# Patient Record
Sex: Female | Born: 1935 | Race: White | Hispanic: No | Smoking: Former smoker
Health system: Southern US, Community
[De-identification: ages and names within clinical notes are randomized; demographics above are authoritative.]

## PROBLEM LIST (undated history)

## (undated) DIAGNOSIS — F329 Major depressive disorder, single episode, unspecified: Secondary | ICD-10-CM

## (undated) DIAGNOSIS — R55 Syncope and collapse: Secondary | ICD-10-CM

## (undated) DIAGNOSIS — G40909 Epilepsy, unspecified, not intractable, without status epilepticus: Secondary | ICD-10-CM

## (undated) DIAGNOSIS — R5383 Other fatigue: Secondary | ICD-10-CM

## (undated) DIAGNOSIS — K219 Gastro-esophageal reflux disease without esophagitis: Secondary | ICD-10-CM

## (undated) DIAGNOSIS — I5032 Chronic diastolic (congestive) heart failure: Secondary | ICD-10-CM

## (undated) DIAGNOSIS — E785 Hyperlipidemia, unspecified: Secondary | ICD-10-CM

## (undated) DIAGNOSIS — Z720 Tobacco use: Secondary | ICD-10-CM

## (undated) DIAGNOSIS — N183 Chronic kidney disease, stage 3 unspecified: Secondary | ICD-10-CM

## (undated) DIAGNOSIS — F32A Depression, unspecified: Secondary | ICD-10-CM

## (undated) DIAGNOSIS — R413 Other amnesia: Secondary | ICD-10-CM

## (undated) DIAGNOSIS — R42 Dizziness and giddiness: Secondary | ICD-10-CM

## (undated) DIAGNOSIS — I10 Essential (primary) hypertension: Secondary | ICD-10-CM

## (undated) DIAGNOSIS — J449 Chronic obstructive pulmonary disease, unspecified: Secondary | ICD-10-CM

## (undated) DIAGNOSIS — F419 Anxiety disorder, unspecified: Secondary | ICD-10-CM

## (undated) DIAGNOSIS — D649 Anemia, unspecified: Secondary | ICD-10-CM

## (undated) DIAGNOSIS — I2510 Atherosclerotic heart disease of native coronary artery without angina pectoris: Secondary | ICD-10-CM

## (undated) HISTORY — DX: Anemia, unspecified: D64.9

## (undated) HISTORY — DX: Syncope and collapse: R55

## (undated) HISTORY — DX: Tobacco use: Z72.0

## (undated) HISTORY — DX: Hyperlipidemia, unspecified: E78.5

## (undated) HISTORY — DX: Chronic diastolic (congestive) heart failure: I50.32

## (undated) HISTORY — DX: Anxiety disorder, unspecified: F41.9

## (undated) HISTORY — DX: Major depressive disorder, single episode, unspecified: F32.9

## (undated) HISTORY — DX: Atherosclerotic heart disease of native coronary artery without angina pectoris: I25.10

## (undated) HISTORY — DX: Other amnesia: R41.3

## (undated) HISTORY — DX: Chronic obstructive pulmonary disease, unspecified: J44.9

## (undated) HISTORY — DX: Dizziness and giddiness: R42

## (undated) HISTORY — DX: Epilepsy, unspecified, not intractable, without status epilepticus: G40.909

## (undated) HISTORY — DX: Chronic kidney disease, stage 3 (moderate): N18.3

## (undated) HISTORY — PX: ABDOMINAL HYSTERECTOMY: SHX81

## (undated) HISTORY — DX: Gastro-esophageal reflux disease without esophagitis: K21.9

## (undated) HISTORY — DX: Other fatigue: R53.83

## (undated) HISTORY — DX: Depression, unspecified: F32.A

## (undated) HISTORY — DX: Chronic kidney disease, stage 3 unspecified: N18.30

## (undated) HISTORY — DX: Essential (primary) hypertension: I10

## (undated) SURGERY — Surgical Case
Anesthesia: *Unknown

---

## 1998-11-27 ENCOUNTER — Ambulatory Visit (HOSPITAL_COMMUNITY): Admission: RE | Admit: 1998-11-27 | Discharge: 1998-11-27 | Payer: Self-pay | Admitting: Gastroenterology

## 2000-02-04 ENCOUNTER — Encounter: Payer: Self-pay | Admitting: Emergency Medicine

## 2000-02-05 ENCOUNTER — Inpatient Hospital Stay (HOSPITAL_COMMUNITY): Admission: EM | Admit: 2000-02-05 | Discharge: 2000-02-08 | Payer: Self-pay | Admitting: Emergency Medicine

## 2000-02-08 ENCOUNTER — Encounter: Payer: Self-pay | Admitting: Internal Medicine

## 2000-09-25 ENCOUNTER — Encounter: Payer: Self-pay | Admitting: Family Medicine

## 2000-09-25 ENCOUNTER — Ambulatory Visit (HOSPITAL_COMMUNITY): Admission: RE | Admit: 2000-09-25 | Discharge: 2000-09-25 | Payer: Self-pay | Admitting: Family Medicine

## 2001-01-03 ENCOUNTER — Ambulatory Visit (HOSPITAL_COMMUNITY): Admission: RE | Admit: 2001-01-03 | Discharge: 2001-01-03 | Payer: Self-pay

## 2001-05-11 ENCOUNTER — Ambulatory Visit (HOSPITAL_COMMUNITY): Admission: RE | Admit: 2001-05-11 | Discharge: 2001-05-11 | Payer: Self-pay | Admitting: Family Medicine

## 2001-05-11 ENCOUNTER — Encounter: Payer: Self-pay | Admitting: Family Medicine

## 2001-09-04 ENCOUNTER — Ambulatory Visit (HOSPITAL_COMMUNITY): Admission: RE | Admit: 2001-09-04 | Discharge: 2001-09-04 | Payer: Self-pay | Admitting: Family Medicine

## 2001-09-04 ENCOUNTER — Encounter: Payer: Self-pay | Admitting: Family Medicine

## 2001-09-17 ENCOUNTER — Encounter: Payer: Self-pay | Admitting: Cardiology

## 2001-09-17 ENCOUNTER — Inpatient Hospital Stay (HOSPITAL_COMMUNITY): Admission: EM | Admit: 2001-09-17 | Discharge: 2001-09-20 | Payer: Self-pay | Admitting: Emergency Medicine

## 2002-02-06 ENCOUNTER — Encounter: Payer: Self-pay | Admitting: Cardiology

## 2002-02-06 ENCOUNTER — Inpatient Hospital Stay (HOSPITAL_COMMUNITY): Admission: EM | Admit: 2002-02-06 | Discharge: 2002-02-09 | Payer: Self-pay | Admitting: Emergency Medicine

## 2002-03-27 ENCOUNTER — Emergency Department (HOSPITAL_COMMUNITY): Admission: EM | Admit: 2002-03-27 | Discharge: 2002-03-27 | Payer: Self-pay | Admitting: Emergency Medicine

## 2002-03-27 ENCOUNTER — Encounter: Payer: Self-pay | Admitting: *Deleted

## 2002-04-15 ENCOUNTER — Ambulatory Visit (HOSPITAL_COMMUNITY): Admission: RE | Admit: 2002-04-15 | Discharge: 2002-04-15 | Payer: Self-pay | Admitting: Cardiology

## 2002-08-22 HISTORY — PX: CARDIAC CATHETERIZATION: SHX172

## 2002-12-31 ENCOUNTER — Encounter: Admission: RE | Admit: 2002-12-31 | Discharge: 2002-12-31 | Payer: Self-pay | Admitting: Family Medicine

## 2002-12-31 ENCOUNTER — Encounter: Payer: Self-pay | Admitting: Family Medicine

## 2003-03-12 ENCOUNTER — Encounter: Payer: Self-pay | Admitting: Internal Medicine

## 2003-03-12 ENCOUNTER — Ambulatory Visit (HOSPITAL_COMMUNITY): Admission: RE | Admit: 2003-03-12 | Discharge: 2003-03-12 | Payer: Self-pay | Admitting: Internal Medicine

## 2003-06-04 ENCOUNTER — Ambulatory Visit (HOSPITAL_COMMUNITY): Admission: RE | Admit: 2003-06-04 | Discharge: 2003-06-04 | Payer: Self-pay | Admitting: Cardiology

## 2003-07-07 ENCOUNTER — Encounter: Admission: RE | Admit: 2003-07-07 | Discharge: 2003-07-07 | Payer: Self-pay | Admitting: Orthopedic Surgery

## 2003-07-09 ENCOUNTER — Ambulatory Visit (HOSPITAL_COMMUNITY): Admission: RE | Admit: 2003-07-09 | Discharge: 2003-07-09 | Payer: Self-pay | Admitting: Orthopedic Surgery

## 2003-07-09 ENCOUNTER — Encounter (INDEPENDENT_AMBULATORY_CARE_PROVIDER_SITE_OTHER): Payer: Self-pay | Admitting: *Deleted

## 2003-07-09 ENCOUNTER — Ambulatory Visit (HOSPITAL_BASED_OUTPATIENT_CLINIC_OR_DEPARTMENT_OTHER): Admission: RE | Admit: 2003-07-09 | Discharge: 2003-07-09 | Payer: Self-pay | Admitting: Orthopedic Surgery

## 2004-02-11 ENCOUNTER — Encounter: Admission: RE | Admit: 2004-02-11 | Discharge: 2004-02-11 | Payer: Self-pay | Admitting: Family Medicine

## 2004-04-25 ENCOUNTER — Emergency Department (HOSPITAL_COMMUNITY): Admission: EM | Admit: 2004-04-25 | Discharge: 2004-04-25 | Payer: Self-pay | Admitting: Emergency Medicine

## 2004-04-30 ENCOUNTER — Ambulatory Visit: Payer: Self-pay | Admitting: Family Medicine

## 2004-05-18 ENCOUNTER — Ambulatory Visit (HOSPITAL_COMMUNITY): Admission: RE | Admit: 2004-05-18 | Discharge: 2004-05-18 | Payer: Self-pay | Admitting: Internal Medicine

## 2004-05-18 ENCOUNTER — Encounter: Payer: Self-pay | Admitting: Cardiology

## 2004-05-24 ENCOUNTER — Ambulatory Visit: Payer: Self-pay | Admitting: Family Medicine

## 2004-05-25 ENCOUNTER — Ambulatory Visit: Payer: Self-pay | Admitting: Family Medicine

## 2004-05-25 ENCOUNTER — Other Ambulatory Visit: Admission: RE | Admit: 2004-05-25 | Discharge: 2004-05-25 | Payer: Self-pay | Admitting: Family Medicine

## 2004-06-14 ENCOUNTER — Ambulatory Visit: Payer: Self-pay | Admitting: Family Medicine

## 2004-09-08 ENCOUNTER — Ambulatory Visit (HOSPITAL_COMMUNITY): Admission: RE | Admit: 2004-09-08 | Discharge: 2004-09-08 | Payer: Self-pay

## 2004-09-08 ENCOUNTER — Ambulatory Visit: Payer: Self-pay | Admitting: Family Medicine

## 2004-09-10 ENCOUNTER — Ambulatory Visit (HOSPITAL_COMMUNITY): Admission: RE | Admit: 2004-09-10 | Discharge: 2004-09-10 | Payer: Self-pay | Admitting: Internal Medicine

## 2004-09-10 ENCOUNTER — Ambulatory Visit: Payer: Self-pay | Admitting: Family Medicine

## 2004-09-14 ENCOUNTER — Ambulatory Visit: Payer: Self-pay | Admitting: Family Medicine

## 2004-10-21 ENCOUNTER — Ambulatory Visit: Payer: Self-pay | Admitting: Family Medicine

## 2004-10-22 ENCOUNTER — Ambulatory Visit (HOSPITAL_COMMUNITY): Admission: RE | Admit: 2004-10-22 | Discharge: 2004-10-22 | Payer: Self-pay | Admitting: Internal Medicine

## 2004-10-26 ENCOUNTER — Ambulatory Visit (HOSPITAL_COMMUNITY): Admission: RE | Admit: 2004-10-26 | Discharge: 2004-10-26 | Payer: Self-pay | Admitting: Internal Medicine

## 2004-11-23 ENCOUNTER — Ambulatory Visit: Payer: Self-pay | Admitting: Family Medicine

## 2005-02-03 ENCOUNTER — Ambulatory Visit: Payer: Self-pay | Admitting: Family Medicine

## 2005-02-28 ENCOUNTER — Ambulatory Visit: Payer: Self-pay | Admitting: Family Medicine

## 2005-03-01 ENCOUNTER — Ambulatory Visit (HOSPITAL_COMMUNITY): Admission: RE | Admit: 2005-03-01 | Discharge: 2005-03-01 | Payer: Self-pay | Admitting: Internal Medicine

## 2005-03-16 ENCOUNTER — Inpatient Hospital Stay (HOSPITAL_COMMUNITY): Admission: EM | Admit: 2005-03-16 | Discharge: 2005-03-20 | Payer: Self-pay | Admitting: Emergency Medicine

## 2005-06-22 ENCOUNTER — Ambulatory Visit: Payer: Self-pay | Admitting: Family Medicine

## 2005-06-24 ENCOUNTER — Encounter: Admission: RE | Admit: 2005-06-24 | Discharge: 2005-06-24 | Payer: Self-pay | Admitting: Family Medicine

## 2006-11-14 ENCOUNTER — Encounter: Admission: RE | Admit: 2006-11-14 | Discharge: 2006-11-14 | Payer: Self-pay | Admitting: Family Medicine

## 2007-02-16 ENCOUNTER — Inpatient Hospital Stay (HOSPITAL_COMMUNITY): Admission: EM | Admit: 2007-02-16 | Discharge: 2007-02-19 | Payer: Self-pay | Admitting: Emergency Medicine

## 2007-02-19 ENCOUNTER — Encounter (INDEPENDENT_AMBULATORY_CARE_PROVIDER_SITE_OTHER): Payer: Self-pay | Admitting: Internal Medicine

## 2007-02-19 ENCOUNTER — Ambulatory Visit: Payer: Self-pay | Admitting: Vascular Surgery

## 2007-02-26 ENCOUNTER — Telehealth (INDEPENDENT_AMBULATORY_CARE_PROVIDER_SITE_OTHER): Payer: Self-pay | Admitting: *Deleted

## 2007-07-24 ENCOUNTER — Encounter: Admission: RE | Admit: 2007-07-24 | Discharge: 2007-07-24 | Payer: Self-pay | Admitting: Cardiology

## 2008-02-18 ENCOUNTER — Ambulatory Visit: Payer: Self-pay | Admitting: Oncology

## 2008-02-18 LAB — CBC & DIFF AND RETIC
BASO%: 0.9 % (ref 0.0–2.0)
HCT: 33.2 % — ABNORMAL LOW (ref 34.8–46.6)
HGB: 11.6 g/dL (ref 11.6–15.9)
IRF: 0.3 (ref 0.130–0.330)
MCHC: 35 g/dL (ref 32.0–36.0)
MONO#: 0.8 10*3/uL (ref 0.1–0.9)
NEUT%: 53.7 % (ref 39.6–76.8)
RDW: 13.3 % (ref 11.3–14.5)
RETIC #: 52.7 10*3/uL (ref 19.7–115.1)
WBC: 8.1 10*3/uL (ref 3.9–10.0)
lymph#: 2.8 10*3/uL (ref 0.9–3.3)

## 2008-02-18 LAB — CHCC SMEAR

## 2008-02-20 LAB — IRON AND TIBC
%SAT: 21 % (ref 20–55)
TIBC: 409 ug/dL (ref 250–470)
UIBC: 325 ug/dL

## 2008-02-20 LAB — LACTATE DEHYDROGENASE: LDH: 137 U/L (ref 94–250)

## 2008-02-20 LAB — COMPREHENSIVE METABOLIC PANEL
ALT: 8 U/L (ref 0–35)
AST: 16 U/L (ref 0–37)
Alkaline Phosphatase: 53 U/L (ref 39–117)
CO2: 23 mEq/L (ref 19–32)
Sodium: 127 mEq/L — ABNORMAL LOW (ref 135–145)
Total Bilirubin: 0.5 mg/dL (ref 0.3–1.2)
Total Protein: 7.4 g/dL (ref 6.0–8.3)

## 2008-02-20 LAB — VITAMIN B12: Vitamin B-12: 513 pg/mL (ref 211–911)

## 2008-03-14 LAB — CBC WITH DIFFERENTIAL/PLATELET
Basophils Absolute: 0 10*3/uL (ref 0.0–0.1)
EOS%: 2.6 % (ref 0.0–7.0)
Eosinophils Absolute: 0.2 10*3/uL (ref 0.0–0.5)
HGB: 10.4 g/dL — ABNORMAL LOW (ref 11.6–15.9)
NEUT#: 3.8 10*3/uL (ref 1.5–6.5)
RDW: 13.3 % (ref 11.3–14.5)
lymph#: 3.2 10*3/uL (ref 0.9–3.3)

## 2008-04-23 ENCOUNTER — Ambulatory Visit: Payer: Self-pay | Admitting: Oncology

## 2008-04-29 LAB — CBC WITH DIFFERENTIAL/PLATELET
BASO%: 0.3 % (ref 0.0–2.0)
LYMPH%: 38.9 % (ref 14.0–48.0)
MCHC: 34.7 g/dL (ref 32.0–36.0)
MCV: 93.5 fL (ref 81.0–101.0)
MONO%: 9.5 % (ref 0.0–13.0)
Platelets: 282 10*3/uL (ref 145–400)
RBC: 3.88 10*6/uL (ref 3.70–5.32)
RDW: 15.5 % — ABNORMAL HIGH (ref 11.3–14.5)
WBC: 6.9 10*3/uL (ref 3.9–10.0)

## 2008-04-29 LAB — IRON AND TIBC
%SAT: 39 % (ref 20–55)
Iron: 109 ug/dL (ref 42–145)

## 2008-04-29 LAB — COMPREHENSIVE METABOLIC PANEL
ALT: 11 U/L (ref 0–35)
AST: 16 U/L (ref 0–37)
Alkaline Phosphatase: 75 U/L (ref 39–117)
Creatinine, Ser: 1.07 mg/dL (ref 0.40–1.20)
Sodium: 138 mEq/L (ref 135–145)
Total Bilirubin: 0.4 mg/dL (ref 0.3–1.2)

## 2008-04-29 LAB — LACTATE DEHYDROGENASE: LDH: 126 U/L (ref 94–250)

## 2008-04-29 LAB — FERRITIN: Ferritin: 844 ng/mL — ABNORMAL HIGH (ref 10–291)

## 2008-06-06 ENCOUNTER — Ambulatory Visit: Payer: Self-pay | Admitting: Oncology

## 2008-06-10 LAB — CBC WITH DIFFERENTIAL/PLATELET
Basophils Absolute: 0 10*3/uL (ref 0.0–0.1)
Eosinophils Absolute: 0.2 10*3/uL (ref 0.0–0.5)
HGB: 11.7 g/dL (ref 11.6–15.9)
LYMPH%: 40.9 % (ref 14.0–48.0)
MCV: 94.6 fL (ref 81.0–101.0)
MONO#: 0.7 10*3/uL (ref 0.1–0.9)
MONO%: 12 % (ref 0.0–13.0)
NEUT#: 2.7 10*3/uL (ref 1.5–6.5)
Platelets: 256 10*3/uL (ref 145–400)
RDW: 14.4 % (ref 11.3–14.5)
WBC: 6.1 10*3/uL (ref 3.9–10.0)

## 2008-06-10 LAB — COMPREHENSIVE METABOLIC PANEL
Albumin: 4.2 g/dL (ref 3.5–5.2)
Alkaline Phosphatase: 61 U/L (ref 39–117)
BUN: 11 mg/dL (ref 6–23)
CO2: 23 mEq/L (ref 19–32)
Glucose, Bld: 88 mg/dL (ref 70–99)
Potassium: 3.7 mEq/L (ref 3.5–5.3)
Total Protein: 6.5 g/dL (ref 6.0–8.3)

## 2008-06-10 LAB — LACTATE DEHYDROGENASE: LDH: 125 U/L (ref 94–250)

## 2008-06-10 LAB — IRON AND TIBC
Iron: 58 ug/dL (ref 42–145)
UIBC: 173 ug/dL

## 2008-06-10 LAB — FERRITIN: Ferritin: 647 ng/mL — ABNORMAL HIGH (ref 10–291)

## 2008-09-02 ENCOUNTER — Encounter: Admission: RE | Admit: 2008-09-02 | Discharge: 2008-09-02 | Payer: Self-pay | Admitting: Family Medicine

## 2008-09-08 ENCOUNTER — Ambulatory Visit: Payer: Self-pay | Admitting: Oncology

## 2008-12-15 ENCOUNTER — Encounter: Admission: RE | Admit: 2008-12-15 | Discharge: 2008-12-15 | Payer: Self-pay | Admitting: Family Medicine

## 2008-12-26 ENCOUNTER — Ambulatory Visit (HOSPITAL_COMMUNITY): Admission: RE | Admit: 2008-12-26 | Discharge: 2008-12-26 | Payer: Self-pay | Admitting: Family Medicine

## 2009-01-22 ENCOUNTER — Ambulatory Visit: Payer: Self-pay | Admitting: Oncology

## 2009-01-26 LAB — CBC WITH DIFFERENTIAL/PLATELET
Basophils Absolute: 0 10*3/uL (ref 0.0–0.1)
Eosinophils Absolute: 0.2 10*3/uL (ref 0.0–0.5)
HGB: 11.6 g/dL (ref 11.6–15.9)
MONO#: 0.8 10*3/uL (ref 0.1–0.9)
NEUT#: 4.2 10*3/uL (ref 1.5–6.5)
RDW: 12.5 % (ref 11.2–14.5)
WBC: 7.5 10*3/uL (ref 3.9–10.3)
lymph#: 2.2 10*3/uL (ref 0.9–3.3)

## 2009-01-27 LAB — IRON AND TIBC: TIBC: 294 ug/dL (ref 250–470)

## 2009-01-27 LAB — COMPREHENSIVE METABOLIC PANEL
ALT: 12 U/L (ref 0–35)
AST: 17 U/L (ref 0–37)
Albumin: 4.5 g/dL (ref 3.5–5.2)
CO2: 21 mEq/L (ref 19–32)
Calcium: 9.4 mg/dL (ref 8.4–10.5)
Chloride: 95 mEq/L — ABNORMAL LOW (ref 96–112)
Creatinine, Ser: 1.12 mg/dL (ref 0.40–1.20)
Potassium: 4.3 mEq/L (ref 3.5–5.3)

## 2009-01-27 LAB — FERRITIN: Ferritin: 623 ng/mL — ABNORMAL HIGH (ref 10–291)

## 2009-01-28 ENCOUNTER — Ambulatory Visit (HOSPITAL_COMMUNITY): Admission: RE | Admit: 2009-01-28 | Discharge: 2009-01-28 | Payer: Self-pay | Admitting: Family Medicine

## 2009-01-30 ENCOUNTER — Ambulatory Visit (HOSPITAL_COMMUNITY): Admission: RE | Admit: 2009-01-30 | Discharge: 2009-01-30 | Payer: Self-pay | Admitting: Oncology

## 2009-05-31 ENCOUNTER — Encounter: Payer: Self-pay | Admitting: Emergency Medicine

## 2009-05-31 ENCOUNTER — Ambulatory Visit: Payer: Self-pay | Admitting: Interventional Radiology

## 2009-05-31 ENCOUNTER — Inpatient Hospital Stay (HOSPITAL_COMMUNITY): Admission: EM | Admit: 2009-05-31 | Discharge: 2009-06-02 | Payer: Self-pay | Admitting: Internal Medicine

## 2009-07-23 ENCOUNTER — Ambulatory Visit: Payer: Self-pay | Admitting: Oncology

## 2009-08-24 ENCOUNTER — Ambulatory Visit: Payer: Self-pay | Admitting: Oncology

## 2009-08-27 LAB — CBC WITH DIFFERENTIAL/PLATELET
BASO%: 0 % (ref 0.0–2.0)
EOS%: 2.9 % (ref 0.0–7.0)
MCH: 32.4 pg (ref 25.1–34.0)
MCHC: 34 g/dL (ref 31.5–36.0)
MONO#: 0.8 10*3/uL (ref 0.1–0.9)
RBC: 3.79 10*6/uL (ref 3.70–5.45)
WBC: 7.8 10*3/uL (ref 3.9–10.3)
lymph#: 3.6 10*3/uL — ABNORMAL HIGH (ref 0.9–3.3)

## 2009-08-28 LAB — COMPREHENSIVE METABOLIC PANEL
ALT: 13 U/L (ref 0–35)
AST: 19 U/L (ref 0–37)
CO2: 23 mEq/L (ref 19–32)
Calcium: 9.2 mg/dL (ref 8.4–10.5)
Chloride: 104 mEq/L (ref 96–112)
Sodium: 139 mEq/L (ref 135–145)
Total Bilirubin: 0.5 mg/dL (ref 0.3–1.2)
Total Protein: 6.8 g/dL (ref 6.0–8.3)

## 2009-08-28 LAB — TRANSFERRIN RECEPTOR, SOLUABLE: Transferrin Receptor, Soluble: 15.5 nmol/L

## 2009-08-28 LAB — LACTATE DEHYDROGENASE: LDH: 132 U/L (ref 94–250)

## 2009-08-28 LAB — IRON AND TIBC
TIBC: 234 ug/dL — ABNORMAL LOW (ref 250–470)
UIBC: 163 ug/dL

## 2009-10-21 ENCOUNTER — Encounter: Admission: RE | Admit: 2009-10-21 | Discharge: 2009-10-21 | Payer: Self-pay | Admitting: Orthopedic Surgery

## 2010-02-23 ENCOUNTER — Ambulatory Visit: Payer: Self-pay | Admitting: Oncology

## 2010-02-25 LAB — COMPREHENSIVE METABOLIC PANEL
ALT: 17 U/L (ref 0–35)
Albumin: 4.3 g/dL (ref 3.5–5.2)
CO2: 26 mEq/L (ref 19–32)
Calcium: 9.2 mg/dL (ref 8.4–10.5)
Chloride: 102 mEq/L (ref 96–112)
Glucose, Bld: 82 mg/dL (ref 70–99)
Potassium: 4.6 mEq/L (ref 3.5–5.3)
Sodium: 135 mEq/L (ref 135–145)
Total Protein: 6.9 g/dL (ref 6.0–8.3)

## 2010-02-25 LAB — CBC WITH DIFFERENTIAL/PLATELET
BASO%: 0.1 % (ref 0.0–2.0)
Eosinophils Absolute: 0.1 10*3/uL (ref 0.0–0.5)
LYMPH%: 47.6 % (ref 14.0–49.7)
MCHC: 34.4 g/dL (ref 31.5–36.0)
MONO#: 0.8 10*3/uL (ref 0.1–0.9)
NEUT#: 3.5 10*3/uL (ref 1.5–6.5)
Platelets: 280 10*3/uL (ref 145–400)
RBC: 3.71 10*6/uL (ref 3.70–5.45)
WBC: 8.4 10*3/uL (ref 3.9–10.3)
lymph#: 4 10*3/uL — ABNORMAL HIGH (ref 0.9–3.3)

## 2010-02-25 LAB — LACTATE DEHYDROGENASE: LDH: 122 U/L (ref 94–250)

## 2010-02-26 LAB — IRON AND TIBC
%SAT: 35 % (ref 20–55)
Iron: 97 ug/dL (ref 42–145)

## 2010-05-03 ENCOUNTER — Ambulatory Visit: Payer: Self-pay | Admitting: Cardiology

## 2010-05-31 ENCOUNTER — Encounter: Admission: RE | Admit: 2010-05-31 | Discharge: 2010-05-31 | Payer: Self-pay | Admitting: Cardiology

## 2010-05-31 ENCOUNTER — Ambulatory Visit: Payer: Self-pay | Admitting: Cardiology

## 2010-08-26 ENCOUNTER — Ambulatory Visit: Payer: Self-pay | Admitting: Oncology

## 2010-08-30 LAB — IRON AND TIBC
%SAT: 22 % (ref 20–55)
Iron: 56 ug/dL (ref 42–145)
TIBC: 258 ug/dL (ref 250–470)
UIBC: 202 ug/dL

## 2010-08-30 LAB — CBC WITH DIFFERENTIAL/PLATELET
BASO%: 0.3 % (ref 0.0–2.0)
Basophils Absolute: 0 10*3/uL (ref 0.0–0.1)
EOS%: 3.7 % (ref 0.0–7.0)
Eosinophils Absolute: 0.3 10*3/uL (ref 0.0–0.5)
HCT: 33.8 % — ABNORMAL LOW (ref 34.8–46.6)
HGB: 11.6 g/dL (ref 11.6–15.9)
LYMPH%: 36.2 % (ref 14.0–49.7)
MCH: 32.4 pg (ref 25.1–34.0)
MCHC: 34.4 g/dL (ref 31.5–36.0)
MCV: 94.4 fL (ref 79.5–101.0)
MONO#: 0.8 10*3/uL (ref 0.1–0.9)
MONO%: 10.4 % (ref 0.0–14.0)
NEUT#: 3.9 10*3/uL (ref 1.5–6.5)
NEUT%: 49.4 % (ref 38.4–76.8)
Platelets: 290 10*3/uL (ref 145–400)
RBC: 3.58 10*6/uL — ABNORMAL LOW (ref 3.70–5.45)
RDW: 12.6 % (ref 11.2–14.5)
WBC: 7.8 10*3/uL (ref 3.9–10.3)
lymph#: 2.8 10*3/uL (ref 0.9–3.3)

## 2010-08-30 LAB — COMPREHENSIVE METABOLIC PANEL
ALT: 11 U/L (ref 0–35)
AST: 21 U/L (ref 0–37)
Albumin: 4.7 g/dL (ref 3.5–5.2)
Alkaline Phosphatase: 67 U/L (ref 39–117)
BUN: 10 mg/dL (ref 6–23)
CO2: 23 mEq/L (ref 19–32)
Calcium: 9.9 mg/dL (ref 8.4–10.5)
Chloride: 104 mEq/L (ref 96–112)
Creatinine, Ser: 1.08 mg/dL (ref 0.40–1.20)
Glucose, Bld: 94 mg/dL (ref 70–99)
Potassium: 4.7 mEq/L (ref 3.5–5.3)
Sodium: 139 mEq/L (ref 135–145)
Total Bilirubin: 0.3 mg/dL (ref 0.3–1.2)
Total Protein: 7 g/dL (ref 6.0–8.3)

## 2010-08-30 LAB — LACTATE DEHYDROGENASE: LDH: 124 U/L (ref 94–250)

## 2010-08-30 LAB — FERRITIN: Ferritin: 499 ng/mL — ABNORMAL HIGH (ref 10–291)

## 2010-09-12 ENCOUNTER — Encounter: Payer: Self-pay | Admitting: Family Medicine

## 2010-11-25 LAB — BASIC METABOLIC PANEL
BUN: 11 mg/dL (ref 6–23)
Calcium: 9 mg/dL (ref 8.4–10.5)
Calcium: 9.1 mg/dL (ref 8.4–10.5)
Chloride: 97 mEq/L (ref 96–112)
Creatinine, Ser: 1.1 mg/dL (ref 0.4–1.2)
GFR calc Af Amer: >60 mL/min (ref >60)
GFR calc non Af Amer: 55 mL/min — ABNORMAL LOW (ref >60)
GFR calc non Af Amer: 48 mL/min — ABNORMAL LOW (ref >60)
Sodium: 133 mEq/L — ABNORMAL LOW (ref 135–145)

## 2010-11-25 LAB — EXPECTORATED SPUTUM ASSESSMENT W GRAM STAIN, RFLX TO RESP C

## 2010-11-25 LAB — CBC
HCT: 32.1 % — ABNORMAL LOW (ref 36.0–46.0)
Hemoglobin: 10.4 g/dL — ABNORMAL LOW (ref 12.0–15.0)
MCHC: 33.9 g/dL (ref 30.0–36.0)
MCV: 98.6 fL (ref 78.0–100.0)
MCV: 95.1 fL (ref 78.0–100.0)
Platelets: 340 10*3/uL (ref 150–400)
Platelets: 368 10*3/uL (ref 150–400)
RBC: 3.14 MIL/uL — ABNORMAL LOW (ref 3.87–5.11)
WBC: 15.6 10*3/uL — ABNORMAL HIGH (ref 4.0–10.5)
WBC: 16 10*3/uL — ABNORMAL HIGH (ref 4.0–10.5)
WBC: 12.6 10*3/uL — ABNORMAL HIGH (ref 4.0–10.5)

## 2010-11-25 LAB — CULTURE, BLOOD (ROUTINE X 2): Culture: NO GROWTH

## 2010-11-25 LAB — PHOSPHORUS
Phosphorus: 2.4 mg/dL (ref 2.3–4.6)
Phosphorus: 2.6 mg/dL (ref 2.3–4.6)

## 2010-11-25 LAB — COMPREHENSIVE METABOLIC PANEL
Albumin: 3.1 g/dL — ABNORMAL LOW (ref 3.5–5.2)
BUN: 13 mg/dL (ref 6–23)
CO2: 25 mEq/L (ref 19–32)
Calcium: 8.7 mg/dL (ref 8.4–10.5)
Chloride: 98 mEq/L (ref 96–112)
Creatinine, Ser: 0.9 mg/dL (ref 0.4–1.2)
GFR calc non Af Amer: >60 mL/min (ref >60)
Total Bilirubin: 0.3 mg/dL (ref 0.3–1.2)

## 2010-11-25 LAB — DIFFERENTIAL
Basophils Relative: 1 % (ref 0–1)
Eosinophils Absolute: 0.1 10*3/uL (ref 0.0–0.7)
Lymphs Abs: 2.2 10*3/uL (ref 0.7–4.0)
Neutrophils Relative %: 71 % (ref 43–77)

## 2010-11-25 LAB — LIPID PANEL
HDL: 47 mg/dL (ref >39)
Total CHOL/HDL Ratio: 2.7 RATIO

## 2010-11-25 LAB — POCT B-TYPE NATRIURETIC PEPTIDE (BNP): B Natriuretic Peptide, POC: 121 pg/mL — ABNORMAL HIGH (ref 0–100)

## 2010-11-25 LAB — PROTIME-INR
INR: 1.07 (ref 0.00–1.49)
Prothrombin Time: 13.8 seconds (ref 11.6–15.2)

## 2010-12-01 ENCOUNTER — Encounter: Payer: Self-pay | Admitting: Cardiology

## 2010-12-01 DIAGNOSIS — E785 Hyperlipidemia, unspecified: Secondary | ICD-10-CM | POA: Insufficient documentation

## 2010-12-01 DIAGNOSIS — D649 Anemia, unspecified: Secondary | ICD-10-CM | POA: Insufficient documentation

## 2010-12-01 DIAGNOSIS — R232 Flushing: Secondary | ICD-10-CM | POA: Insufficient documentation

## 2010-12-01 DIAGNOSIS — Z720 Tobacco use: Secondary | ICD-10-CM | POA: Insufficient documentation

## 2010-12-01 DIAGNOSIS — F419 Anxiety disorder, unspecified: Secondary | ICD-10-CM | POA: Insufficient documentation

## 2010-12-01 DIAGNOSIS — R0602 Shortness of breath: Secondary | ICD-10-CM | POA: Insufficient documentation

## 2010-12-01 DIAGNOSIS — I259 Chronic ischemic heart disease, unspecified: Secondary | ICD-10-CM | POA: Insufficient documentation

## 2010-12-01 DIAGNOSIS — G40909 Epilepsy, unspecified, not intractable, without status epilepticus: Secondary | ICD-10-CM | POA: Insufficient documentation

## 2010-12-01 DIAGNOSIS — J189 Pneumonia, unspecified organism: Secondary | ICD-10-CM | POA: Insufficient documentation

## 2010-12-01 DIAGNOSIS — R5383 Other fatigue: Secondary | ICD-10-CM | POA: Insufficient documentation

## 2010-12-01 DIAGNOSIS — R42 Dizziness and giddiness: Secondary | ICD-10-CM | POA: Insufficient documentation

## 2010-12-01 DIAGNOSIS — I2510 Atherosclerotic heart disease of native coronary artery without angina pectoris: Secondary | ICD-10-CM | POA: Insufficient documentation

## 2010-12-01 DIAGNOSIS — R413 Other amnesia: Secondary | ICD-10-CM | POA: Insufficient documentation

## 2010-12-01 DIAGNOSIS — I10 Essential (primary) hypertension: Secondary | ICD-10-CM | POA: Insufficient documentation

## 2010-12-01 DIAGNOSIS — R55 Syncope and collapse: Secondary | ICD-10-CM | POA: Insufficient documentation

## 2010-12-02 ENCOUNTER — Ambulatory Visit (INDEPENDENT_AMBULATORY_CARE_PROVIDER_SITE_OTHER): Payer: 59 | Admitting: Cardiology

## 2010-12-02 ENCOUNTER — Encounter: Payer: Self-pay | Admitting: Cardiology

## 2010-12-02 DIAGNOSIS — I10 Essential (primary) hypertension: Secondary | ICD-10-CM

## 2010-12-02 DIAGNOSIS — I2510 Atherosclerotic heart disease of native coronary artery without angina pectoris: Secondary | ICD-10-CM

## 2010-12-02 NOTE — Assessment & Plan Note (Signed)
Blood pressure readings have been satisfactory. She sees Dr. Larita Fife her primary care.

## 2010-12-02 NOTE — Progress Notes (Signed)
Subjective:   [name] [name] comes in today for followup visit. In general, she's thinking better and overall has been doing better. She still fatigue and has occasional dizziness but she notes her blood pressures been satisfactory. She had 2 seizures last week and she is always more fatigue after she has a seizure. She's never been injured with a fall. She had a stress Cardiolite study in may of 2011 with an ejection fraction of 74%. Otherwise, she has been doing well.  She has a history of ischemic heart disease with her last cardiac catheterization in 2004. She's had known anemia and also a history of iron infusions. She has a remote history of tobacco abuse.  Current Outpatient Prescriptions  Medication Sig Dispense Refill  . citalopram (CELEXA) 40 MG tablet Take 40 mg by mouth daily.        . clopidogrel (PLAVIX) 75 MG tablet Take 75 mg by mouth daily.        . Cyanocobalamin (VITAMIN B 12 PO) Take by mouth as needed.        Marland Kitchen esomeprazole (NEXIUM) 40 MG capsule Take 40 mg by mouth daily before breakfast.        . furosemide (LASIX) 40 MG tablet Take 40 mg by mouth as needed.        . isosorbide mononitrate (IMDUR) 30 MG 24 hr tablet Take 30 mg by mouth daily.        . LevETIRAcetam (KEPPRA PO) Take by mouth 2 (two) times daily.        . Multiple Vitamin (MULTIVITAMIN) tablet Take 1 tablet by mouth as needed.        . pregabalin (LYRICA) 75 MG capsule Take 75 mg by mouth 2 (two) times daily.       . valsartan (DIOVAN) 160 MG tablet Take 160 mg by mouth daily.        . budesonide-formoterol (SYMBICORT) 160-4.5 MCG/ACT inhaler Inhale 2 puffs into the lungs 2 (two) times daily.        . montelukast (SINGULAIR) 10 MG tablet Take 10 mg by mouth at bedtime.          Allergies  Allergen Reactions  . Codeine     Patient Active Problem List  Diagnoses  . SOB (shortness of breath)  . Fatigue  . Anxiety  . Seizure  . Dizziness  . Hot flashes  . Ischemic heart disease  . Hypertension  .  Hyperlipidemia  . Syncope and collapse  . Coronary artery disease  . Anemia  . Syncope and collapse  . Tobacco abuse  . Memory disorder  . Pneumonia    History  Smoking status  . Former Smoker  . Quit date: 08/23/1999  Smokeless tobacco  . Not on file    History  Alcohol Use No    Family History  Problem Relation Age of Onset  . Heart attack Father   . Heart failure Mother     Review of Systems:   The patient denies any heat or cold intolerance.  No weight gain or weight loss.  The patient denies headaches or blurry vision.  There is no cough or sputum production.  The patient denies dizziness.  There is no hematuria or hematochezia.  The patient denies any muscle aches or arthritis.  The patient denies any rash.  The patient denies frequent falling or instability.  There is no history of depression or anxiety.  All other systems were reviewed and are negative.   Physical Exam:  Her weight is 131. Blood pressure is 130/82 sitting, heart rate is 66 and regular.The head is normocephalic and atraumatic.  Pupils are equally round and reactive to light.  Sclerae nonicteric.  Conjunctiva is clear.  Oropharynx is unremarkable.  There's adequate oral airway.  Neck is supple there are no masses.  Thyroid is not enlarged.  There is no lymphadenopathy.  Lungs are clear.  Chest is symmetric.  Heart shows a regular rate and rhythm.  S1 and S2 are normal.  There is no murmur click or gallop.  Abdomen is soft normal bowel sounds.  There is no organomegaly.  Genital and rectal deferred.  Extremities are without edema.  Peripheral pulses are adequate.  Neurologically intact.  Full range of motion.  The patient is not depressed.  Skin is warm and dry.  Assessment / Plan:

## 2010-12-02 NOTE — Assessment & Plan Note (Signed)
In general, she's gone well. She's up-to-date on her stress test. She has a normal ejection fraction. She's not having any chest pain. Her son notes that she's doing reasonably well.

## 2011-01-04 NOTE — Consult Note (Signed)
NAMELAVAYAH, VITA                  ACCOUNT NO.:  [id number]   MEDICAL RECORD NO.:  [id number]          PATIENT TYPE:  INP   LOCATION:  4734                         FACILITY:  [facility]   PHYSICIAN:  Colleen Can. [name], M.D.DATE OF BIRTH:  [birth year]   DATE OF CONSULTATION:  02/16/2007  DATE OF DISCHARGE:                                 CONSULTATION   [name] [name] is a 77 year old female with a history of ischemic heart  disease, known three-vessel disease.  She was admitted following a  syncopal episode this morning.  She has not really felt well for the  past week in a nonspecific way.  She is complaining of leg cramps.  She  awoke this morning and took a Vicodin with a cup of coffee, and really  had not eaten when she had a visit with her neighbor.  She was sitting  and talking with her neighbor when she had a syncopal episode.  She  actually was incontinent of stool.  EMS was called.  She was not  hypoglycemic.  She is referred for evaluation.   PAST MEDICAL HISTORY:  She has ischemic heart disease, with her last  catheterization in October of 2004 with normal LV function at 30-40%,  right coronary artery stenosis and 50-60% intermediate stenosis and 70%  ostial LAD.  She had her last Cardiolite study in December of 2006,  which was negative for ischemia with an ejection fraction of 80%.  She  has a history of tobacco abuse remotely.  There is a history of  hypertension, anemia and hypercholesterolemia.  She has had a  hysterectomy, diverticulitis and depression.   ALLERGIES:  CODEINE.   CURRENT MEDICINES:  1. Lovastatin 20 mg a day.  2. Imdur 30 mg a day.  3. Celexa 40 mg a day.  4. Plavix 75 mg a day.  5. Nexium 40 mg per day.  6. Multivitamin.  7. Toprol XL 50 mg per day.  8. Norvasc 5 mg, still on hold.  9. Spiriva.  10.Lyrica.   FAMILY HISTORY:  Father died with MI at age 29.  Mother died in her 19s  of congestive heart failure.   SOCIAL HISTORY:  She is a widow.  She is  a remote smoker.  She lives in  an apartment for seniors.   REVIEW OF SYSTEMS:  She has not felt well; she has felt tired.  No chest  pain.  No shortness of breath.  She has had some mild dizziness.   PHYSICAL EXAMINATION:  GENERAL:  On exam, she is a pleasant, elderly  female.  VITAL SIGNS:  Blood pressure is 146/63, heart rate is in the 50s.  SKIN:  Skin is warm and dry.  Color is normal.  LUNGS:  Lungs are clear.  HEART:  Heart shows regular rate and rhythm.  ABDOMEN:  Soft.  EXTREMITIES:  Without edema.  NEUROLOGIC:  She has no gross deficits.   Troponin and CKs are negative.  Urinalysis is negative.  Chemistries  show potassium of 5.4, creatinine 1.1, BUN of 12, glucose is 103.  Hematocrit is  36, white count is 8000, platelets are 303,000.  EKG shows  sinus bradycardia.  There are no acute changes.   OVERALL IMPRESSION:  1. Syncope, of questionable etiology.  2. Known ischemic heart disease.  3. Hypertension.  4. Hypercholesterolemia.  5. Bradycardia.   PLAN:  1. Will admit.  2. Will stop her Toprol.  3. Will watch her on telemetry.  4. Check TSH.  5. CT scan of her head.  6. 2D echocardiogram.  7. Will follow with you.      Colleen Can. [name], M.D.  Electronically Signed     SNT/MEDQ  D:  02/16/2007  T:  02/17/2007  Job:  [id number]   cc:   Renaye Rakers, M.D.  [facility]

## 2011-01-04 NOTE — H&P (Signed)
NAMEDELINDA, [name]                  ACCOUNT NO.:  [id number]   MEDICAL RECORD NO.:  [id number]          PATIENT TYPE:  EMS   LOCATION:  MAJO                         FACILITY:  [facility]   PHYSICIAN:  Altha Harm, MDDATE OF BIRTH:  [birth year]   DATE OF ADMISSION:  02/16/2007  DATE OF DISCHARGE:                              HISTORY & PHYSICAL   CHIEF COMPLAINT:  Syncope.   HISTORY OF PRESENT ILLNESS:  This is a 77 year old female who presents  to the emergency room after being transported by the emergency medical  system which was called by her neighbor.  According to the patient she  had been feeling poorly for most of the week and this morning woke up  feeling even worse than she had been feeling in the days gone by.  The  patient states that she got up this morning and had a bout of diarrhea  in her bed as she was unable to get to the bathroom in an adequate  amount of time.  She also stated that she took her Nexium and then  decided to take a Vicodin in the hopes that it would improve the way she  was feeling.  She then sat down with a neighbor to have a cup of coffee  and said that immediately prior to having her syncopal episode she  became dizzy and her eyes became dark.  The patient states that she then  passed out and she is unable to quantify the amount of time for which  she was out.  She states that she could not hear any voices during the  time that she was out and did not know what was going on.  Her next  recollection is that of the EMS attending to her.  The patient has had  no medication changes.  She denies any chest pain or palpitations.  She  denies any dizziness except for this one episode this morning  immediately prior to passing out.  The patient states that she has  chronic back pain and states that she had not taken a Vicodin for  approximately 4 weeks until she took one this morning.  She denies any  tremors.  She denies any loss of strength.  She denies  any unilateral  symptoms, any circumoral, dysesthesias or any facial abnormalities.  The  patient is a patient of Dr. Renaye Rakers for primary care and Dr. [name]  is her cardiologist.  The patient states that she had had several  cardiac catheterizations in the past, all of which were normal.   PAST MEDICAL HISTORY:  1. Significant for hypertension.  2. Gastroesophageal reflux disease.  3. History of coronary artery disease.  4. History of anemia.  5. History of mild hyponatremia.  6. History of depression.   FAMILY HISTORY:  Positive and significant in a patient this age.   SOCIAL HISTORY:  The patient resides by herself.  She denies any  tobacco, alcohol, or drug use.   CURRENT MEDICATIONS:  Metoprolol, Celexa, Nexium, Diovan, Isosorbide  mononitrate, Celebrex and Plavix.  The patient does not  know the doses  of any of these medications.   A review of her old records shows that her last hospitalization was in  2006.   As noted before, the patient's primary care physician is Dr. Renaye Rakers, her cardiologist is Dr. [name].   REVIEW OF SYSTEMS:  Twelve systems are reviewed.  All systems are  negative except as noted in the HPI.   STUDIES DONE IN THE EMERGENCY ROOM:  Urinalysis is negative for any  elements consistent with a urinary tract infection.  White blood cell  count is 8.6, hemoglobin 12.4, hematocrit 36.7, platelets 303.  Sodium  131, potassium 5.4, chloride 97, bicarb 27, BUN 12, creatinine 1.1.  CK  of 110, CK-MB is 1.3, troponin of 0.02.  Twelve-lead EKG shows sinus bradycardia and nonspecific ST-T wave  abnormalities.   PHYSICAL EXAMINATION:  The patient is lying in bed resting comfortably.  Vital signs at this time are as follows:  Blood pressure 146/63, heart  rate 48, respiratory rate 15, temperature 97.4 orally.  HEENT:  The patient is Normocephalic and non-traumatic.  Pupils are  equally round and reactive to light and accommodation.  Extraocular   movements are intact.  There is some mild conjunctival pallor.  There is  no icterus.  Tympanic membranes are translucent bilaterally with good  landmarks.  Oropharynx is moist, no exudate or erythema or lesions are  noted.  NECK:  Trachea is midline, no masses, no thyromegaly, no JVD, no carotid  bruit.  RESPIRATORY:  The patient has equal excursion bilaterally, no wheezes or  rhonchi noted, no increased local __________.  CARDIOVASCULAR:  The patient has a normal S1 and S2, no murmurs, rubs or  gallops are noted.  PMI is nondisplaced and __________ on palpation.  ABDOMEN:  Abdomen is protuberant, soft, nontender, nondistended, no  masses, no hepatosplenomegaly noted, and she has normoactive bowel  sounds.  EXTREMITIES:  Gait is deferred at this time.  MUSCULOSKELETAL:  The patient has arthritic changes in the lower and  upper extremities.  She has got functional range of motion throughout  the upper and lower extremities.  She has got no warm swelling or  erythema around the joints, no spinal tenderness noted.  NEUROLOGICAL:  The patient is able to move all 4 extremities against  gravity.  A formal strength examination was not performed on this  patient.  DTRs are 2+ bilaterally upper and lower extremities.  Sensation is intact to light touch or proprioception.  \  LYMPH NODES:  She has got no cervical, axillary or inguinal adenopathy  noted.  PSYCHIATRIC:  The patient is alert and oriented x3.  She is able to  account the history of events this morning very well.  She appears to  have normal insight and cognition and good recent and remote recall.   ASSESSMENT AND PLAN:  Patient who presents with syncopal episode which  appears to be multifactorial.  The patient does have some bradycardia,  in addition to which she has some hyponatremia, most likely caused by  her Celexa, and then on top of that, she took Vicodin on an empty  stomach.  The patient also admits to eating poorly this  week and may  have had some component of hypoglycemia, versus this generalized  weakness from not eating well.  We will bring the patient in and at this  time I will hold her metoprolol due to the bradycardia and will observe  the patient.  The last echocardiogram that  I can find for this patient  is from 2006.  Thus I will order an echocardiogram.  I will also ask her  cardiologist, Dr. [name], to see her, as he knows the patient well and  knows her cardiac history and performance.  We will also get carotid  Duplex on this patient to assess for any degree of carotid stenosis that  may be contributory.  In reviewing the patient's old record, it seems  that she has a history of hyponatremia likely associated with her Celexa  and SIDs.  We will gently hydrate the patient with 0.9 normal saline in  attempt to  improve her sodium in the blood.  Presently I can see no other obvious  reasons for why this patient should have had a syncopal episode, however  we will continue to monitor the patient and further decisions will be  made based upon evaluation of the patient over the next several hours.      Altha Harm, MD  Electronically Signed     MAM/MEDQ  D:  02/16/2007  T:  02/16/2007  Job:  [id number]   cc:   Renaye Rakers, M.D.  Colleen Can. [name], M.D.

## 2011-01-04 NOTE — Procedures (Signed)
EEG NUMBER:  [id number].   REQUESTING PHYSICIAN:  Renaye Rakers, MD   CLINICAL HISTORY:  This is a routine EEG with photic stimulation, but  not hyperventilation.  The patient describes awake and alert.  This is a  77 year old woman with history of syncope.  EEG is performed for  evaluation.   DESCRIPTION:  The dominant rhythm in this tracing is a low to moderate  amplitude alpha rhythm of 9-10 Hz which predominates posteriorly,  appears without abnormal asymmetry, and attenuates with eye opening and  closing.  Much of the record is obscured by abundant and persistent  muscle artifact, especially in the frontal and temporal areas, which  greatly limit the sensitivity of this study.  However, to the extent  that study is interpretable, no focal slowing is noted and no  epileptiform discharges were seen.  The patient remained in awake state  throughout the recording.  Single channel devoted EKG revealed sinus  rhythm throughout with a rate of approximately 60 beats per minute.   CONCLUSIONS:  Normal, but limited study in the awake state.  The study  is limited by a persistent muscle artifact which obscures readings in  the frontal and temporal areas throughout much of the study.  Repeat EEG  when the patient is better able to relax and cooperate with the  examination may be useful if felt clinically indicated.      Michael L. Thad Ranger, M.D.  Electronically Signed     YNW:GNFA  D:  12/26/2008 21:58:59  T:  12/27/2008 07:25:20  Job #:  [id number]

## 2011-01-04 NOTE — Discharge Summary (Signed)
[name] [name], DONATI                  ACCOUNT NO.:  [id number]   MEDICAL RECORD NO.:  [id number]          PATIENT TYPE:  INP   LOCATION:  4734                         FACILITY:  [facility]   PHYSICIAN:  Elliot Cousin, M.D.    DATE OF BIRTH:  07-05-1927   DATE OF ADMISSION:  02/16/2007  DATE OF DISCHARGE:  02/19/2007                               DISCHARGE SUMMARY   PRIMARY CARE PHYSICIAN:  Renaye Rakers, M.D.   PRIMARY CARDIOLOGIST:  Colleen Can. [name], M.D.   DISCHARGE DIAGNOSES:  1. Syncope.  Possible etiologies include bradycardia, Vicodin, and      orthostatic hypotension.  2. Bradycardia secondary to Toprol XL, resolved after the Toprol was      discontinued.  3. Hypertension.  4. Coronary artery disease.  Cardiolite study in December 2006, was      negative for ischemia and the ejection fraction was estimated at      80%.  5. Degenerative joint disease/restless leg syndrome.  6. Nausea and vomiting thought to be secondary to fish for dinner,      resolved.  7. Delirium secondary to Phenergan, resolved.   SECONDARY DISCHARGE DIAGNOSES:  1. Coronary artery disease per cardiac catheterization in October      2004, and Cardiolite study in December 2006, revealed no evidence      of ischemia.  2. Hyperlipidemia.  3. Gastroesophageal reflux disease.  4. Chronic anemia.  5. Depression.  6. Status post hysterectomy.  7. History of diverticulitis.  8. Remote tobacco history.   DISCHARGE MEDICATIONS:  1. Stop Toprol, Vicodin, and Norvasc.  2. Avapro 150 mg daily.  3. Nexium 40 mg daily.  4. Lyrica 150 mg daily p.r.n.  5. Celebrex 200 mg daily.  6. Vigamox t.i.d. to the left eye.  7. Plavix 75 mg daily.  8. Lovastatin 20 mg daily.  9. Imdur 30 mg daily.  10.Boniva 150 mg daily.  11.Celexa 40 mg daily.   DISCHARGE DISPOSITION:  The patient was discharged to home in improved  and stable condition on February 19, 2007.  The patient was advised to  follow up with Dr. [name] in 1 week  and Dr. Parke Simmers in 2 weeks.   CONSULTATIONS:  Colleen Can. [name], M.D.   PROCEDURES PERFORMED:  1. CT scan of the head without contrast on February 16, 2007.  The results      revealed atrophy and chronic microvascular white matter disease.      No definite acute abnormality.  2. Carotid Doppler study, February 19, 2007.  The results revealed no      evidence of ICA stenosis.  3. A 2-D echocardiogram.  Results are currently pending.   HISTORY OF PRESENT ILLNESS:  The patient is a 77 year old woman with a  past medical history significant for coronary artery disease,  hypertension, and degenerative joint disease.  She presented to the  emergency department on February 16, 2007, after passing out at home.  Apparently, the patient had not been feeling well for several days.  She  took a Vicodin on an empty stomach to relieve arthritis pain  in her  legs.  A few minutes later, the patient passed out.  When she arrived to  the emergency department, she was alert and oriented.  Her heart rate  was found to be 48.  The patient was, therefore, admitted for further  evaluation and management.   For additional details, please see the dictated history and physical.   HOSPITAL COURSE:  1. SYNCOPE.  As indicated above, the patient's heart rate was 48 at      the time of the initial presentation.  Her EKG in the emergency      department revealed a heart rate of 48 beats per minute and sinus      bradycardia.  The Toprol was discontinued.  A CT scan of the head      was ordered for further evaluation.  The CT scan was negative for      any acute abnormalities.  For additional evaluation, cardiac      enzymes, TSH, and an urinalysis were ordered.  Her cardiac enzymes      were negative.  The urinalysis was essentially negative.  And, her      TSH was within normal limits at 0.871.  Carotid Dopplers and a 2-D      echocardiogram were ordered, however, over the weekend they were      not performed.  The carotid  Doppler study was performed today and      revealed no ICA stenosis.  The results of the 2-D echocardiogram      are currently pending.  The patient's cardiologist, Dr. [name], was consulted for further  evaluation.  He agreed with stopping the Toprol.  He recommended volume  repletion and supportive care which were already being done.  Of note,  orthostatic vital signs were ordered and the patient was borderline  orthostatic.  Over the course of the hospitalization, the patient's symptoms  completely resolved.  She had no complaints of dizziness, headache, or  generalized weakness during the entire hospitalization.  The patient was  advised to discontinue Vicodin and Toprol.  1. HYPERTENSION/CORONARY ARTERY DISEASE.  As indicated above, the      patient's cardiac enzymes were negative.  Because of the Toprol      being discontinued, the patient's blood pressure increased.  As a      consequence, Dr. [name] started the patient on Avapro initially at      75 mg daily.  As of yesterday, the Avapro was increased to 150 mg      daily.  The patient's systolic blood pressure has been ranging      between 120 and 150 during the past 24 hours.  2. NAUSEA AND VOMITING.  The patient had an episode of nausea and      vomiting during the hospitalization.  It occurred several hours      after eating fish for dinner.  The patient was treated with      Phenergan and Zofran.  Her abdomen was nontender on exam.  Her      liver transaminases were within normal limits.  Her lipase was      within normal limits as well.  The following day, the patient's      nausea and vomiting completely resolved.  3. DELIRIUM SECONDARY TO PHENERGAN.  After the patient received Zofran      and later Phenergan, she became confused.  As a consequence, the      Phenergan was discontinued and the dose of  Zofran was decreased.      Several hours later, the patient's delirium completely resolved.      Her cranial nerves were  completely intact.      Elliot Cousin, M.D.  Electronically Signed     DF/MEDQ  D:  02/19/2007  T:  02/19/2007  Job:  [id number]   cc:   Renaye Rakers, M.D.  Colleen Can. [name], M.D.

## 2011-01-04 NOTE — Procedures (Signed)
EEG NUMBER:  [id number].   HISTORY:  This is an 77 year old patient with history of fibromyalgia,  severe headaches, and episodes of loss of consciousness.  The patient is  being evaluated for the events.  This is a routine EEG.  No skull  defects noted.   MEDICATIONS:  Clonazepam, Lyrica, Nexium, Imdur, Diovan, Plavix, and  Celexa.   EEG CLASSIFICATION:  Normal weight.   DESCRIPTION OF RECORDING:  Background rhythm of this recording consists  of a fairly well-modulated medium amplitude alpha rhythm of 9 Hz that is  reactive to eye open and closure.  As the record progresses, the patient  appears to remain in a waking state throughout the recording.  Muscle  artifact is prominent throughout the recording.  Photic stimulation is  performed resulting in a bilateral and symmetric photic drive response.  Hyperventilation is also performed resulting in a slight buildup of the  background rhythm activity without significant slowing seen.  At no  time, during the recording there appear to be evidence of spikes, spike  wave discharges, or evidence of focal slowing.  EKG monitor shows no  evidence of cardiac rhythm abnormalities with a heart rate of 78.   IMPRESSION:  This is a normal EEG recording in the waking state.  No  evidence of ictal or interictal discharges were seen.      Marlan Palau, M.D.  Electronically Signed     ZOX:WRUE  D:  01/28/2009 12:15:35  T:  01/29/2009 02:08:11  Job #:  [id number]

## 2011-01-07 NOTE — H&P (Signed)
[place]. [facility]  Patient:    [name] [name], [name] [name] Visit Number: [id number] MRN: [id number]          Service Type: EMS Location: [place] Attending Physician:  Doug Sou Dictated by:   Jennet Maduro Earl Gala, R.N., A.N.P. Admit Date:  09/17/2001   CC:         [name], [facility]   History and Physical  CHIEF COMPLAINT:  Recurrent chest pain.  HISTORY OF PRESENT ILLNESS:  [name] [name] is a very pleasant 77 year old white female who has known coronary disease.  She presents to our office with a three-day history of multiple bouts of chest pain identical to her previous chest pain syndrome.  She has taken a multitude of nitroglycerin with only short-term relief.  She has had associated shortness of breath with some mild nausea.  There has been no vomiting.  She was brought into the office for further evaluation.  She is now admitted and plans were made for her to undergo repeat coronary angiography.  PAST MEDICAL HISTORY: 1. Atherosclerotic cardiovascular disease presenting with chest pain in    June 2001, at which time she had documented a 40% right coronary stenosis,    60-70% stenosis in the left anterior descending, and a 50-60% narrowing in    the left circumflex.  She had a negative Cardiolite study in June 2001 and    again in April 2002. 2. Past tobacco abuse, currently resolved. 3. Hypertension. 4. Prior history of anemia. 5. Hypercholesterolemia. 6. Prior history of depression. 7. Status post hysterectomy. 8. History of diverticulitis.  ALLERGIES:  CODEINE.  MEDICATIONS:  Zocor 10 mg a day, aspirin daily, Imdur 15 mg b.i.d., Pepcid 40 mg a day, Norvasc 5 mg a day, and Lopressor 25 mg b.i.d.  FAMILY HISTORY:  Father died of a heart attack at the age of 1.  Mother died in her 42s with congestive heart failure.  SOCIAL HISTORY:  She is a widow.  She is nonsmoking.  There is no alcohol use.  REVIEW OF SYSTEMS:  Basically as stated above  and otherwise unremarkable.  PHYSICAL EXAMINATION:  VITAL SIGNS:  Blood pressure 150/50 sitting, 150/60 standing, heart rate 64 and regular, respirations 18.  She is afebrile.  GENERAL:  She is currently pain-free and in no acute distress.  SKIN:  Warm and dry.  Color is unremarkable.  NECK:  Supple.  CHEST:  Lungs are clear.  CARDIAC:  Heart shows a regular rate and rhythm.  ABDOMEN:  Soft, positive bowel sounds, nontender.  EXTREMITIES:  Extremities show just a trace amount of edema.  LABORATORY DATA:  A twelve-lead electrocardiogram shows some mild inferior T-wave changes but no acute changes.  OVERALL IMPRESSION: 1. Recurrent episodes of angina, most likely due to coronary disease. 2. History of previous cardiac catheterization. 3. Hypertension.  PLAN:  Will proceed on with admission to the hospital.  Will start IV nitroglycerin and heparin.  Will check cardiac enzymes and make plans for her to undergo coronary angiography on September 18, 2001. Dictated by:   Jennet Maduro Earl Gala, R.N., A.N.P. Attending Physician:  Doug Sou DD:  09/17/01 TD:  09/17/01 Job: [id number]

## 2011-01-07 NOTE — Discharge Summary (Signed)
[place]. [facility]  Patient:    [name] [name], [name] [name]                         MRN: [id number] Adm. Date:  [id number] Disc. Date: [id number] Attending:  Rosanne Sack CC:         Willis Modena, M.D., [facility] N. [name], M.D.             Griffith Citron, M.D.                           Discharge Summary  DATE OF BIRTH:  July 18, 1927  DISCHARGE DIAGNOSES: 1. Chest pain, probable noncardiac.    a. Negative cardiac enzymes, negative electrocardiogram.    b. Cardiac catheterization (February 07, 2000):  Ejection fraction 75%, right       coronary artery large and dominant vessel, 40-50% proximal and middle       right coronary artery stenosis, two large distal branches, 20-30%       distal left main, 60-70% ostial left anterior descending, 30-40%       intermediate vessel stenosis, 40-50% ostial left circumflex.    c. Adenosine Cardiolite (February 08, 2000):  Ejection fraction 80%, no       ischemia, anteroapical thinning consistent with breast attenuation.  No       evidence of wall motion abnormalities. 2. Hypertension. 3. Smoking, cessation achieved during this hospital stay. 4. Iron deficiency anemia.    a. Hemoglobin 10.9, MCV 93. 5. History of two colonic polyps, benign by colonoscopy in March 2000, by    Dr. Kinnie Scales. 6. Gastrointestinal ______ with nausea and vomiting.  DISCHARGE MEDICATIONS: 1. Lopressor 25 mg p.o. b.i.d. 2. Enteric-coated aspirin 1 tablet p.o. q.d., 325 mg. 3. Protonix 40 mg p.o. q.d. 4. Wellbutrin SR 150 mg p.o. b.i.d. x 6 weeks. 5. Nicotine patch 21 mg q.d. 6. Iron sulfate 325 mg p.o. t.i.d. 7. Norvasc 5 mg p.o. q.d.  DISCHARGE FOLLOW-UP AND DISPOSITION:  [name] [name] will be followed by Dr. [name], [facility], on Monday, February 14, 2000, at 9:45 a.m. During this follow-up visit, Dr. Margarette Canada is to reassess the patients history of chest pain.  A new CBC is then recommended within two  weeks.  Dr. Margarette Canada is to refer the patient again back to Dr. Kinnie Scales to consider an upper endoscopy to rule out PUD.  Dr. [name] will follow [name] [name] from the cardiac standpoint within two weeks.  Smoking cessation was achieved during this hospital stay with nicotine patches and Wellbutrin.  CONSULTATIONS:  Dr. [name] [facility]).  PROCEDURES: 1. Cardiac catheterization (February 07, 2000), as above. 2. Adenosine Cardiolite (June 19, 20010, as above.  DISCHARGE LABORATORY DATA:  Sodium 140, potassium 4.4, chloride 104, CO2 28, BUN 12, creatinine 1.1, glucose 92.  Hemoglobin 10.9, MCV 93, WBC 8.1, platelets 278.  Iron saturation 12%, reticulocyte count 2.1%, absolute reticulocyte count 69, ferritin 45, TIBC 337, total iron 40, vitamin B12 448. Total cholesterol 168, HDL 49, LDL 93.  HISTORY OF PRESENT ILLNESS:  [name] [name] is a very pleasant 77 year old female admitted to [facility] on February 05, 2000, with complaints of chest pain.  Please see admission H&P by Dr. Robley Fries for further details regarding the history of presentation, physical exam, and laboratory data.  HOSPITAL COURSE: #1 -  CHEST PAIN, PROBABLE NONCARDIAC:  Given the patients advanced age, history of hypertension, smoking, and nature of the patients chest symptoms, a cardiology consult was obtained for further evaluation.  The cardiac enzymes and EKG series were nonacute.  A cardiac catheterization was arranged for February 07, 2000.  This procedure had to be postponed this date because the patient arrived to the hospital on Saturday morning, and the cardiac catheterization could not be performed until Monday a.m.  This cardiac catheterization showed mild to moderate range of ______ .  In the LAD was 60-70% ostial lesion that could have been the culprit for the patients symptoms.  For this reason, a Cardiolite was scheduled for the following day.  I performed this adenosine Cardiolite  without complications.  Some T-wave inversions were noticed in the inferior leads during the stress test.  The images of the adenosine Cardiolite showed no ischemia.  Ejection fraction was 80%.  There was anteroapical thinning due to breast attenuation.  This anteroapical thinning was present at rest as well as during the stress.  No evidence of wall motion abnormalities. Once again, there was no evidence whatsoever of ischemia.  Given the patients anemia consistent with iron deficiency anemia, further evaluation with an upper endoscopy is recommended upon discharge to rule out peptic ulcer disease.  The patient was placed on a beta blocker and enteric-coated aspirin without complications.  No further complaints of chest pain were reported by the patient.  Dr. [name] will follow this patient from the cardiac standpoint within two weeks upon discharge.  The patient remained hemodynamically stable throughout this hospital stay.  The telemetry showed no cardiac arrhythmias.  #2 - IRON DEFICIENCY ANEMIA:  The patients hemoglobin stayed stable in the 10 range.  Her stools were guaiac-negative.  The patient had a colonoscopy in March 2000, that revealed diverticulosis and two small polyps that were benign.  The anemia studies were consistent with iron deficiency anemia.  The patient will be followed by Dr. Kinnie Scales upon discharge for an upper endoscopy for further evaluation of this anemia.  No signs of GI bleed were noticed throughout this hospital stay.  #3 - HYPERTENSION:  The patients blood pressure remained stable throughout this hospital stay with the medications described in the discharge medication list.  #4 - SMOKING:  Smoking cessation was achieved with Wellbutrin and nicotine patches.  Dr. [name] will be in charge of encouraging this patient with smoking cessation upon discharge.  CONDITION ON DISCHARGE:  Improved.DD:  02/08/00 TD:  02/09/00 Job: [id number]

## 2011-01-07 NOTE — Cardiovascular Report (Signed)
[place]. [facility]  Patient:    [name] [name], [name] [name]                         MRN: [id number] Adm. Date:  [id number] Attending:  Rosanne Sack CC:         Pearla Dubonnet, M.D.                        Cardiac Catheterization  HISTORY:  [name] [name] is a 77 year old female who presented with unstable angina. She had a myocardial infarction ruled out and was referred for catheterization.  PROCEDURE:  Left heart catheterization with selective coronary angiography, left ventricular angiography.  TYPE AND SITE OF ENTRY:  Percutaneous right femoral artery.  CATHETERS:  The 6-French four-curved Judkins right and left coronary catheters, 6-French pigtail ventriculographic catheter.  CONTRAST MATERIAL:  Omnipaque.  MEDICATIONS GIVEN PRIOR TO PROCEDURE:  Valium 10 mg p.o.  MEDICATIONS GIVEN DURING PROCEDURE:  Versed 1 mg IV.  COMMENTS:  The patient tolerated the procedure well.  HEMODYNAMIC DATA:  The aortic pressure was 155/39.  LV was 145/23.  There was no aortic valve gradient noted on pullback.  ANGIOGRAPHIC DATA:  A left ventricular angiogram was performed in the RAO position and revealed overall cardiac size and silhouette are normal.  Global ejection fraction is 70%.  There is no regional wall motion abnormality.  No mitral regurgitation or intracardiac calcification or intracavitary filling defect.  Coronary arteries:  The coronary arteries arise and distribute normally. 1. The left main coronary artery has mild distal 20% narrowing. 2. The left anterior descending artery has an ostial 60-70% somewhat    segmental narrowing.  There is scattered irregularities, but the left    anterior descending ends on the anterior wall. 3. The left circumflex has a 50% narrowing ostially.  It gives rise to two    marginal vessels without severe obstructive disease. 4. The right coronary artery is a large dominant vessel.  There is 40-50%    narrowing  proximally, and the mid portion is well.  The distal vessels are    tortuous but relatively large.  OVERALL IMPRESSION: 1. Normal left ventricular function. 2. Moderately severe three-vessel coronary artery disease (40-50% right    coronary artery, 60-70% proximal LAD, 50-60% proximal left circumflex).  DISCUSSION:  I think it is most reasonable to try to treat [name] [name] medically.  She will need to have stress imaging to make sure she does not have any underlying ischemia as well as aggressive management of risk factors.DD:  02/07/00 TD:  02/08/00 Job: [id number]

## 2011-01-07 NOTE — H&P (Signed)
[place]. [facility]  Patient:    MEIYA, WISLER                         MRN: [id number] Adm. Date:  [id number] Attending:  Rosanne Sack CC:         Anastasio Auerbach, M.D.             Willis Modena, M.D. - [facility]                         History and Physical  CHIEF COMPLAINT:  Chest pain.  HISTORY OF PRESENT ILLNESS:  Ms. Pfalzgraf is a very pleasant 77 year old female with a history of hypertension, hypercholesterolemia, a family history of coronary artery disease, and current tobacco use for 50 years, who presents with the onset of precordial deep heavy chest pain, which occurred while awakening from a nap at approximately 4:30 p.m. yesterday afternoon.  It radiated to her left shoulder nd up to her left neck and jaw.  It also radiated down her left arm.  It seemed to  have a crescendo and decrescendo quality intermittently.  It was nonpleuritic. It lasted for several hours, from 4:30 p.m. to about 10 p.m.  It resolved with IV placement and with nasal cannula O2 placement in the emergency room.  It did not have a burning quality.  No belching, no history of reflux symptoms.  She "never had pain like this before."  She started taking Zoloft on January 25, 2000, for depression.  This was prescribed by Dr. [name].  I am not sure what she had been on prior to that.  She also has noted two weeks of increasing weakness with exertion and dyspnea on exertion.  No orthopnea or PND.  No claudication.  Has ad a few nocturnal leg cramps.  She is feeling only minimal precordial pressure on  current examination.  Her son is in the emergency room with her.  His name is Mr. Margrit Minner.  Risk factors for coronary artery disease include the current and long smoking history, mild to moderate hypercholesterolemia, and hypertension.  She also is menopausal and is no longer on hormone replacement therapy.  She has a sister age 16, who had a stent placed in her  coronary artery last year.  She had a brother who died at age 51 of a stroke.  Her father died at age 75 of a heart attack.  Her mother died in her 62s, secondary to CHF.  ALLERGIES:  CODEINE causes nausea and vomiting.  CURRENT MEDICATIONS: 1. Procardia XL 30 mg q.d. 2. Zoloft 50 mg q.d. 3. Aspirin, usually takes one q.d. 4. Premarin.  She took this from age 24 to age 75.  She stopped it last year.    She is no longer on it.  SOCIAL HISTORY:  The patient has been widowed for 12 years.  Two sons in [place].  She lived in [place], [place] most of her life.  Born here in the [place].  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  Smokes tobacco for 55 years, about one pack a day on average.  I did not ask about alcohol use.  She has seen Dr. [name] at [facility] once.  REVIEW OF SYSTEMS:  Noncontributory except as above.  PHYSICAL EXAMINATION:  GENERAL:  A well-developed, well-nourished female, who appears younger than her  stated age.  She is pleasant and in no acute distress.  VITAL SIGNS:  Blood pressure 165/66, temperature 98.2 degrees, O2 saturation 97%, respirations 20, pulse 74 and regular.  HEENT:  Normocephalic, atraumatic.  Extraocular movements intact.  NECK:  Without bruits and supple.  No thyromegaly, no masses.  CHEST:  Clear to auscultation.  CARDIAC:  A regular rhythm without murmur or gallop.  ABDOMEN:  Soft, nontender.  Normal bowel sounds.  NODES:  Axillae are free of adenopathy.  EXTREMITIES:  Without cyanosis, clubbing, or edema.  NEUROLOGIC:  Nonfocal.  A chest x-ray reveals chronic obstructive pulmonary disease but no other active  disease.  Electrocardiogram:  Sinus rhythm at 65.  Question of a 0.5 mm depression in lead II, lead III, and aVF, otherwise normal.  White count 9100, hemoglobin 11.4, platelets 336,000.  Creatinine 1.0, BUN 12. The rest of the BMET is normal.  Initial  CPK is 83 with less than 0.3 CPK-MB, and less than 0.3 troponin I.  ASSESSMENT:  A 77 year old female with many risk factors for coronary artery disease, with probable classic unstable angina. This is a suspicious electrocardiogram for inferior ischemia.  This could be reflux disease, but I doubt it.  PLAN: 1. Treat with IV heparin, nasal cannula O2, IV nitroglycerin.  Check serial    enzymes and obtain a cardiology consultation in the morning.  If she has    recurrent chest pain tonight, especially electrocardiogram changes, will    consult cardiology immediately. 2. For tobacco abuse and depression, will try Wellbutrin as opposed to Zoloft    150 mg p.o. b.i.d., and also place a nicotine patch 7 mg daily.  Will use    Xanax p.r.n. and Restoril p.r.n. for anxiety and insomnia. 3. Treatment with daily aspirin and prophylactic Protonix. 4. For hypertension, change Procardia XL to Norvasc 5 mg q.d. and add Lopressor    25 mg b.i.d. for probable coronary ischemia. DD:  02/05/00 TD:  02/05/00 Job: [id number]

## 2011-01-07 NOTE — Cardiovascular Report (Signed)
NAME:  [name] [name], [name] [name]                            ACCOUNT NO.:  [id number]   MEDICAL RECORD NO.:  [id number]                   PATIENT TYPE:  OIB   LOCATION:  2872                                 FACILITY:  [facility]   PHYSICIAN:  Colleen Can. [name], M.D.            DATE OF BIRTH:  [birth year]   DATE OF PROCEDURE:  06/04/2003  DATE OF DISCHARGE:  06/04/2003                              CARDIAC CATHETERIZATION   PROCEDURE:  Left heart catheterization with selective coronary angiography,  left ventricular angiography.   TYPE AND SITE OF ENTRY:  Percutaneous, right femoral artery with Perclose.   CATHETERS:  6 French 4 curved Judkins right and left coronary catheters; 6  French pigtail ventriculographic catheter.   CONTRAST MATERIAL:  Omnipaque.   MEDICATIONS GIVEN PRIOR TO PROCEDURE:  Valium 10 mg p.o.   MEDICATIONS GIVEN DURING THE PROCEDURE:  Versed 2 mg IV.   COMMENTS:  The patient tolerated the procedure well.   HEMODYNAMIC DATA:  1. The aortic pressure was 146/55.  2. LV was 150/8-14.  3. There was no aortic valve gradient noted on pullback.   ANGIOGRAPHIC DATA:  1. Right coronary artery:  The right coronary artery is a very large     dominant vessel.  There is 30-40% proximal narrowing that is somewhat     smooth in this large vessel.  There are two large branches to the     inferior wall.  2. The left main coronary artery is normal.  3. Left circumflex:  The left circumflex bifurcates early.  There are     irregularities, but no significant focal disease.  4. Intermediate:  The intermediate has a 50-60% narrowing proximally.  It is     a relatively small vessel.  5. Left anterior descending:  The left anterior descending has an ostial 70%     stenosis and then has atherosclerotic plaque in the proximal 2 cm of the     vessel.  The vessel itself is of moderate size with three diagonal     vessels.  It does end, however, on the anterior wall and does not cross     the  apex.   LEFT VENTRICULAR ANGIOGRAM:  Left ventricular angiogram was performed in  RAO position.  Overall cardiac size and silhouette are normal.  Global  ejection fraction is 60-70%.  Regional wall motion is normal.   OVERALL IMPRESSION:  1. Normal left ventricular function.  2. Moderately severe ostial left anterior descending disease with proximal     disease present, moderate intermediate disease of 50-60% narrowing, mild     proximal right coronary artery disease and minimal, if any, left     circumflex disease.   DISCUSSION:  It is felt that we need to at least consider Tobey as a  candidate for coronary artery bypass grafting.  She would not be a candidate  for intervention on the ostium of  the left anterior descending given the  complex bifurcation of this vessel at this location and its involvement of  the intermediate and left circumflex.  It is felt that a stent would cause  occlusion and snow plow into the other proximal branches.  Maybe the  decision for surgery needs to be further supported by functional studies in  addition her symptoms of significant angina.                                                 Colleen Can. [name], M.D.    SNT/MEDQ  D:  06/04/2003  T:  06/05/2003  Job:  [id number]

## 2011-01-07 NOTE — H&P (Signed)
[name]. [facility]  Patient:    [name] [name], NEIGHBORS Visit Number: [id number] MRN: [id number]          Service Type: MED Location: [phone] Attending Physician:  Eleanora Neighbor Dictated by:   Jennet Maduro Earl Gala, R.N., A.N.P. Admit Date:  02/06/2002   CC:         [name]; [facility]   History and Physical  CHIEF COMPLAINT:  Shortness of breath.  HISTORY OF PRESENT ILLNESS:  [name] [name] is a 77 year old white female who has known coronary artery disease.  She has had two subendocardial myocardial infarctions, the last being in January 2003.  She has been elected to be followed along medically.  She presents to the office today as a work-in appointment with complaints of shortness of breath, primarily with exertion. It has been occurring for perhaps over the last month, but certainly worse over the past few days.  She has been unable to lie flat at night.  She also began having chest tightness with just minimal exertion.  She was subsequently seen and evaluated in the office and was referred on to the hospital for further evaluation.  PAST MEDICAL HISTORY: 1. Atherosclerotic cardiovascular disease.  She has known coronary disease    with her last cardiac catheterization in January 2003 revealing normal LV    function and moderate to severe three vessel coronary disease (60%    segmental proximal LAD, 60-70% small intermediate, 30-50% left circumflex,    20-30% right coronary). 2. Hypercholesterolemia. 3. History of anemia with past refusal for GI evaluation. 4. Previous tobacco abuse. 5. Hypertensive heart disease.  ALLERGIES:  CODEINE.  CURRENT MEDICATIONS: 1. Nexium 40 mg q.d. 2. Plavix 75 mg q.d. 3. Imdur 60 mg q.d. 4. Zocor 10 mg q.d. 5. Aspirin daily. 6. Norvasc 5 mg q.d.  FAMILY HISTORY:  Unchanged.  SOCIAL HISTORY:  Unchanged.  REVIEW OF SYSTEMS:  As stated above, but is otherwise unremarkable.  PHYSICAL  EXAMINATION  GENERAL:  She is an elderly female who appears her stated age.  She is moderately dyspneic with just talking.  VITAL SIGNS:  Blood pressure 160/60 sitting, 150/60 standing, heart rate 60 and regular, weight 150 pounds.  SKIN:  Warm and dry.  Color is very sallow and pale.  LUNGS:  Basically clear.  Maybe a few rales in the bases.  CARDIAC:  Regular rhythm.  ABDOMEN:  Soft, positive bowel sounds, nontender.  EXTREMITIES:  With 1+ edema bilaterally.  LABORATORIES:  A 12-lead electrocardiogram shows nonspecific changes with sinus bradycardia.  Other laboratories are pending.  OVERALL IMPRESSION: 1. Worsening dyspnea on exertion. 2. Known coronary disease now presenting with exertional chest tightness. 3. Hypercholesterolemia. 4. Hypertension. 5. History of anemia with refusal in the past for gastrointestinal evaluation.  PLAN:  Will proceed on with admission to the hospital.  Complete laboratories will be checked to rule out further progression of her anemia.  Will draw cardiac enzymes as well as a B-peptide level.  She will be treated with IV Lasix.  Further plan to follow per Dr. Angelina Pih discretion. Dictated by:   Jennet Maduro Earl Gala, R.N., A.N.P. Attending Physician:  Eleanora Neighbor DD:  02/06/02 TD:  02/07/02 Job: [id number]

## 2011-01-07 NOTE — Discharge Summary (Signed)
[place]. [facility]  Patient:    [name] [name], [name] [name] Visit Number: [id number] MRN: [id number]          Service Type: MED Location: [phone] Attending Physician:  Eleanora Neighbor Dictated by:   Jennet Maduro Earl Gala, R.N., A.N.P. Admit Date:  02/06/2002 Discharge Date: 02/09/2002   CC:         Abundio Miu, [facility]   Discharge Summary  PRIMARY DISCHARGE DIAGNOSIS: Shortness of breath of uncertain etiology with negative spiral CT scan and negative cardiac enzymes.  SECONDARY DISCHARGE DIAGNOSES: 1. Known atherosclerotic cardiovascular disease with last catheterization in    January 2003 revealing normal left ventricular function and moderate to    severe three-vessel disease to be followed medically. 2. Hypercholesterolemia. 3. History of anemia. 4. Hypertensive heart disease. 5. Depression now initiated on Celexa.  HISTORY OF PRESENT ILLNESS: The patient is a 77 year old white female who has known coronary disease. She has had previous two subendocardial myocardial infarctions, the last being in January of this year. At that time, following her cardiac catheterization results, it was elected to follow her along medically. She presented to the office on February 06, 2002, as a work-in visit with complaints of shortness of breath which was primarily with exertion. In the office she was unable to speak a few words without being breathless. She had had worsening orthopnea and was unable to lie flat at night. She had also been having exertion chest tightness. She was subsequently admitted from the office and referred on for further evaluation. Please see dictated history and physical for further patient presentation and profile.  LABORATORY DATA ON ADMISSION:  Hemoglobin 11.8, hematocrit 34.9. Chemistries were normal. Cardiac enzymes were negative. B-peptide was 137.  A chest x-ray performed in the office showed no frank CHF.  PROCEDURES  PERFORMED: Spiral CT scan of the chest showing no evidence for PE or significant pulmonary disease.  HOSPITAL COURSE: The patient was admitted. She was treated with IV Lasix and had brisk diuresis and dramatic weight loss. She ruled out negative for myocardial infarction. She was started on low dose SSRI for depression. Her other medications from home were continued. She improved rather nicely throughout the admission, and by February 09, 2002, was felt to be stable for discharge. She was up and ambulatory by the day of discharge without evidence of chest pain or without recurrence of shortness of breath.  CONDITION ON DISCHARGE:  Stable.  DISCHARGE MEDICATIONS:  Will start Celexa 20 mg q.d. She will resume her Imdur, Plavix, aspirin, Norvasc and Nexium, all as she was taking before. Prescription for Lasix 40 mg will be given as needed for rapid weight gain.  DISCHARGE INSTRUCTIONS:  Her activity is to be progressive. Diet is no salt. She is to weigh herself each day and to take diuretics if her weight would go up by 3 pounds.  FOLLOWUP:  She will be seen back in the office in approximately two weeks and she is to call the office to schedule that appointment or certainly sooner if problems would arise again. Dictated by:   Jennet Maduro Earl Gala, R.N., A.N.P. Attending Physician:  Eleanora Neighbor DD:  02/12/02 TD:  02/13/02 Job: [id number]

## 2011-01-07 NOTE — Discharge Summary (Signed)
[place]. [facility]  Patient:    [name], [name] Visit Number: [id number] MRN: [id number]          Service Type: MED Location: [phone] Attending Physician:  Eleanora Neighbor Dictated by:   Jennet Maduro Earl Gala, R.N., A.N.P. Admit Date:  09/17/2001 Disc. Date: 09/20/01   CC:         Dr. [name]   Discharge Summary  DISCHARGE DIAGNOSES: 1. Recurrent subendocardial myocardial infarction with coronary angiography    showing normal left main, osteal 60% narrowing of the left anterior    descending in a segmental fashion, 50-70% narrowing of the left circumflex    with 20-30% narrowing in a very large right coronary artery.  It is best    felt that [name] [name] is to be managed medically with aspirin, Plavix,    beta-blocker, Norvasc and nitrate therapy.  However, [name] [name] would be a    candidate for coronary artery bypass grafting if fails on medical    management. 2. History of subendocardial myocardial infarction in June 2001. 3. Past tobacco abuse, currently resolved. 4. Hypertension, currently controlled. 5. Prior history of anemia. 6. Hypercholesterolemia with satisfactory lipid profile. 7. Prior history of depression.  HISTORY OF PRESENT ILLNESS:  [name] [name] is a very pleasant, 77 year old, white female who has known coronary disease.  [name] [name] presented to our office on Monday of this week after having multiple episodes of chest discomfort over the course of the prior weekend.  [name] [name] had taken a multitude of nitroglycerin only with short-term relief.  [name] [name] had associated shortness of breath with some mild nausea.  When [name] [name] was seen in our office, [name] [name] was referred on to the hospital for admission.  Please see the dictated History and Physical for further patient presentation and profile.  LABORATORY DATA AND X-RAY FINDINGS:  Hematocrit 35, white count 7000, platelets 275,000.  Chemistries all within normal limits.  Total cholesterol 184, triglycerides 225,  HDL 58 and LDL 81.  A 12-lead electrocardiogram showed no acute changes, but with nonspecific changes and sinus bradycardia.  Chest x-ray showed no active disease.  The heart size was within normal limits.  HOSPITAL COURSE:  The patient was admitted.  [name] [name] did have positive troponins noted with maximum troponin at 0.63.  CK-MBs were negative.  [name] [name] was initially placed on IV nitroglycerin as well as IV heparin.  [name] [name] remained pain free throughout the remainder of her hospitalization.  We proceeded on with cardiac catheterization the following day and those results are noted above.  It is best that [name] [name] be managed medically with aspirin, Plavix, beta-blocker, Norvasc and nitrate therapy, however, [name] [name] is felt to be a good candidate for coronary artery bypass grafting if [name] [name] fails with medical management.  After the cardiac catheterization, her IV nitroglycerin has been weaned over to oral therapy.  Cardiac rehabilitation has been initiated.  [name] [name] feels somewhat weak at the time of discharge, but [name] [name] has had no recurrence of chest pain.  Plavix has been added to her medical regimen and her beta-blocker has been increased as well.  Plans will now be made for her to be discharged in the morning if [name] [name] continues to do well with her hospitalization and is deemed stable on a.m. rounds.  CONDITION ON DISCHARGE:  Stable.  DISCHARGE MEDICATIONS: 1. Lopressor 50 mg b.i.d. 2. Plavix 75 mg a day indefinitely. 3. Imdur 60 mg a day. 4. Zocor 10 mg a day. 5. Aspirin daily. 6. Pepcid 40 mg a  day. 7. Norvasc 5 mg a day. 8. Nitroglycerin, new prescription provided.  ACTIVITY:  Light activity over the next two to three weeks.  Will allow her to walk 5-10 minutes twice a day and increase as tolerated.  DIET:  Low fat diet.  FOLLOWUP:  [name] [name] is asked to be seen in the office in approximately one to two weeks and we will ask her to call to set that appointment up.  SPECIAL INSTRUCTIONS:  [name] [name] is to call  for any problems in the interim. Dictated by:   Jennet Maduro Earl Gala, R.N., A.N.P. Attending Physician:  Eleanora Neighbor DD:  09/19/01 TD:  09/19/01 Job: [phone]

## 2011-01-07 NOTE — H&P (Signed)
[name] [name], [name] [name]                  ACCOUNT NO.:  [id number]   MEDICAL RECORD NO.:  [id number]          PATIENT TYPE:  INP   LOCATION:  0101                         FACILITY:  [facility]   PHYSICIAN:  Isidor Holts, M.D.  DATE OF BIRTH:  Jul 09, 1927   DATE OF ADMISSION:  03/16/2005  DATE OF DISCHARGE:                                HISTORY & PHYSICAL   PRIMARY MEDICAL DOCTOR:  [facility]. The patient is unassigned to [place].   CARDIOLOGIST:  Dr. [name].   CHIEF COMPLAINT:  Shortness of breath.   HISTORY OF PRESENT ILLNESS:  This is a 77 year old female, with known  history of CAD with three-vessel disease. Also hypertension and  dyslipidemia. According to her, she was quite well until a.m. of March 15, 2005. On that day, she had orthopedic surgery to her left foot under general  anesthesia performed by Dr. Renae Fickle. She had taken all of her regular medicines  except Lasix on that day. Postoperatively, however, her son took her back to  his own home, and she went straight to bed at about 1:30 p.m. Soon after,  she started feeling unwell, become short of breath, felt dizzy. Denied chest  pain, however, or fever. Her son called EMS.   PAST MEDICAL HISTORY:  1.  Coronary artery disease with moderately severe three-vessel disease      status post a STE MI x2 in 2003.  2.  Dyslipidemia.  3.  Hypertension.  4.  History of depression.  5.  COPD.   MEDICATIONS:  1.  Nexium 40 mg p.o. q.d.  2.  Metoprolol 50 mg p.o. b.i.d.  3.  Lovastatin 20 mg p.o. q.d.  4.  Isosorbide mononitrate 30 mg p.o. q.d.  5.  Norvasc 5 mg p.o. q.d.  6.  Celexa 40 mg p.o. q.d.  7.  Lasix 40 mg p.o. q.a.m., 20 mg p.o. q.p.m.  8.  Vicodin 1 to 2 tablets p.o. p.r.n.  9.  Plavix 75 mg p.o. q.d.   ALLERGIES:  CODEINE.   REVIEW OF SYSTEMS:  As per HPI and chief complaint. Otherwise negative.   SOCIAL HISTORY:  The patient is widowed, lives alone, has two sons who are  very supportive, call every day, stop by very  often and also take her  shopping. She denies alcohol use or smoking. Has a history of drug abuse.   FAMILY HISTORY:  She had one brother who died at age 1 status post CVA. Her  father died status post MI at age 50 years. Mother died in her 80s of heart  failure.   PHYSICAL EXAMINATION:  VITAL SIGNS:  Temperature 98.1, pulse 56 per minute  and regular, respiratory rate 24, BP 113/38 mmHg, pulse oximetry 94% on room  air.  GENERAL:  The patient appears quite comfortable after intravenous Lasix in  the emergency department, not short of breath at rest, alert, communicative.  HEENT:  There is mild clinical pallor. No jaundice. Throat appears quite  clear.  NECK:  Supple. There is no palpable lymphadenopathy. JVP is elevated.  CHEST:  Auscultation of the chest reveals  fine bibasilar crackles, no  wheezes, however.  HEART:  Sounds 1 and 2 heard, normal, regular, bradycardic, no murmurs.  ABDOMEN:  Soft and nontender. There is no palpable organomegaly. No palpable  masses. Normal bowel sounds.  EXTREMITIES:  Lower extremity examination:  No pitting edema. Palpable  peripheral pulses. Left foot is in support and is covered in bandages.   INVESTIGATIONS:  Electrolytes:  Sodium 127, potassium 3.9, chloride 93, CO2  24, BUN 11, creatinine 1.2, glucose 131. Troponin I at point of care 0.05.  BNP 705. Chest x-ray shows cardiomegaly without overt edema. There is  bibasilar atelectasis or infiltrate, tiny pleural effusions bilaterally.  There is also emphysema. EKG shows sinus rhythm, regular, 58 per minute,  normal axis, no acute ischemic changes.   ASSESSMENT/PLAN:  1.  Acute onset of shortness of breath. The patient has elevated JVP,      bibasilar lung crackles, elevated BNP, i.e. features consistent with      congestive heart failure. Will cycle cardiac enzymes, in view of history      of coronary artery disease, and as patient looks somewhat clinically      pale, check CBC to rule out  anemia which may be contributory. Meanwhile,      will manage with oxygen supplementation and intravenous Lasix.   1.  Chronic obstructive pulmonary disease/emphysema. Likely exacerbated      secondary to #1 above. Will manage with bronchodilator nebulizers.   1.  Status post recent left foot orthopedic surgery. Will administer p.r.n.      analgesics.   1.  History of hypertension. This appears controlled at present. Will      continue pre-admission antihypertensive medication.   1.  History of dyslipidemia. Continue present treatment.  Further management according to clinical course.       CO/MEDQ  D:  03/16/2005  T:  03/16/2005  Job:  [id number]   cc:   Colleen Can. [name], M.D.  Fax: [phone]

## 2011-01-07 NOTE — Discharge Summary (Signed)
NAMEBREELEY, [name] [name]                  ACCOUNT NO.:  [id number]   MEDICAL RECORD NO.:  [id number]          PATIENT TYPE:  INP   LOCATION:  1412                         FACILITY:  [facility]   PHYSICIAN:  Elliot Cousin, M.D.    DATE OF BIRTH:  23-Sep-1926   DATE OF ADMISSION:  03/16/2005  DATE OF DISCHARGE:  03/20/2005                                 DISCHARGE SUMMARY   DISCHARGE DIAGNOSES:  1.  Congestive heart failure with superimposed chronic obstructive pulmonary      disease.  2.  History of three-vessel coronary artery disease, followed by      cardiologist Dr. [name].  3.  Mild hyponatremia.  4.  Mild normocytic anemia.  5.  Status post recent left foot surgery by Dr. Renae Fickle.  6.  Hyperlipidemia.  7.  Hypertension.  8.  History of depression.  9.  Gastroesophageal reflux disease.   DISCHARGE MEDICATIONS:  1.  Lasix 40 milligrams b.i.d.  2.  Metoprolol 50 milligrams b.i.d.  3.  Lovastatin 20 milligrams q.h.s.  4.  Isosorbide mononitrate 30 milligrams daily.  5.  Norvasc 5 milligrams daily.  6.  Celexa 40 milligrams daily.  7.  Plavix 75 milligrams daily.  8.  Nexium 40 milligrams daily.  9.  Centrum Silver vitamin once daily.   DISCHARGE DISPOSITION:  The patient was discharged home in improved and  stable condition on July30,2006. She was advised to follow up with  orthopedic surgeon Dr. Renae Fickle in one week and Dr. [name] in two weeks. She  was advised to follow up with her physician at [facility] in two to three  weeks.   HISTORY OF PRESENT ILLNESS:  The patient is a 77 year old lady with a past  medical history significant for CAD and COPD, who presented to the emergency  department on July26,2006 with a chief complaint of shortness of breath. The  patient had left foot surgery under general anesthesia on July25,2006.  Apparently, she had been taken off of all of her regular medicines with the  exception of Lasix on that day. Postoperatively, on the way home, the  patient  felt short of breath. She denied any chest pain, fever or cough. The  patient was therefore transferred to the emergency department. She was later  admitted for further evaluation and management. For additional details,  please see the dictated history and physical.   HOSPITAL COURSE:  Problem 1:  SHORTNESS OF BREATH THOUGHT TO BE SECONDARY TO  CONGESTIVE HEART FAILURE SUPERIMPOSED ON COPD:  On admission, the patient's  lab data was significant for a sodium of 127, BNP of 705, and a chest x-ray  which showed cardiomegaly without overt edema. There were, however, tiny  pleural effusions bilaterally on chest x-ray. There was also evidence of  COPD on the chest x-ray as well. Her EKG on admission revealed sinus rhythm  with bradycardia, heart rate 58 beats per minute. There were no acute ST-T  wave abnormalities. The patient was started on intravenous Lasix at 40  milligrams IV q.12h. She was continued on her cardiac medications including  metoprolol, lovastatin, isosorbide,  Plavix and Norvasc. For further  evaluation, cardiac enzymes were ordered to rule out a myocardial  infarction. The cardiac enzymes x3 were completely negative. The patient was  also started on nebulizer treatments with albuterol and Atrovent. She became  less symptomatic throughout the hospital course. There were a few crackles  and wheezes on admission; however, they completely resolved at the time of  hospital discharge.   The patient's I's and O's and weights did not appear to be accurate during  the hospital course. However, on lung exam, the patient's lungs became  clearer and clearer. The Lasix was transitioned to p.o. The patient had been  previously treated with Lasix 60 milligrams daily, 40 milligrams in the  morning, and 20 milligrams in the evening. However, the patient was advised  to increase the Lasix to 40 milligrams b.i.d. at the time of hospital  discharge. The patient did have mild bradycardia during  hospital course,  this was thought to be secondary to the metoprolol. The patient's heart rate  ranged between 55 and 65 during hospital course. She was hemodynamically  stable with regards to her blood pressure. A 2-D echocardiogram was  contemplated; however, over the weekend, the patient improved significantly  and was therefore discharged without the 2-D echo. However, it is  recommended that the patient undergo an outpatient echocardiogram for  further evaluation. Will defer to her cardiologist, Dr. [name] . The  patient was advised to avoid highly salty foods and to watch her fluid  intake. The patient's BNP did improve during hospital course.  At the time  of admission her BNP was 705 and at the time of hospital discharge it was  53.8.   Problem 2:  HYPONATREMIA:  The patient's sodium was mildly decreased during  hospital course. Her serum sodium was 127 on admission; however, following  treatment with Lasix, it improved slightly to 132 prior to hospital  discharge.   Problem 3:  MILD NORMOCYTIC ANEMIA:  The patient's hemoglobin ranged between  10.5 and 11.6 during hospital course. She was advised to take a multivitamin  with iron daily. She takes Nexium as well.   Problem 4:  RECENT LEFT FOOT SURGERY PER DR. PAUL:  Dr. Renae Fickle was consulted  during the hospital course. His associate, evaluated the dressing and  bandage on the left foot. Apparently, the dressing was reinforced by the  associate for Dr. Renae Fickle. A new dressing and Ace wrap was applied. The patient  did receive some physical therapy and occupational therapy during the  hospital course. At one point it was felt that the patient may need  rehabilitation; however, during hospital course, she ambulated quite well in  the hallway with the therapist. The  patient felt that she did not need  SACU  and she wanted to go home. Home health services were ordered for the patient including home health R.N.,  home health PT, home  health OT, a walker, and bedside commode. The patient  was advised to follow up with Dr. Renae Fickle as previously ordered.       DF/MEDQ  D:  03/21/2005  T:  03/21/2005  Job:  [id number]   cc:   Colleen Can. [name], M.D.  Fax: [phone]   V. Charlesetta Shanks, M.D.  [address]  [place], [place] [address]  Fax: [phone]

## 2011-01-07 NOTE — Consult Note (Signed)
[place]. [facility]  Patient:    [name] [name], [name] [name]                         MRN: [id number] Proc. Date: 02/05/00 Adm. Date:  [id number] Disc. Date: [id number] Attending:  Rosanne Sack CC:         Rosanne Sack, M.D.             Ammie Dalton, M.D.                          Consultation Report  HISTORY:  Mrs. Kalina is a 77 year old female who has noted a prolonged episode of chest pain.  She has not felt well for over two weeks with no energy.  She had chest pain radiating to her left shoulder and arm associated with diaphoresis lasting for several hours.  She is brought to the emergency room now pain free on IV nitroglycerin and IV heparin.  PAST MEDICAL HISTORY:  She has had depression recently started on Zoloft. Tobacco abuse.  History of bronchitis times two in the past. Hypertension. Status post hysterectomy 30 years ago and diverticulitis.  ALLERGIES:  Codeine.  CURRENT MEDICATIONS: IV nitroglycerin, IV heparin, aspirin, Protonix, Wellbutrin 150 mg b.i.d. Norvasc 5 mg q.d.  Xanax p.r.n.  Nicotine patch and Lopressor 25 b.i.d.  FAMILY HISTORY:  Father died of MI at age 63. Mother died in her 82s with congestive heart failure. One brother died in his 69s of CVA.  One sister with a stent at age 80.  SOCIAL HISTORY:  She is has been a widow for 12 years. Smoker one pack a day for 55 years.  REVIEW OF SYSTEMS:  A two week history of increasing weakness with exertion, dyspnea on exertion, staying in the bed.  No orthopnea, PND or claudication. No history of gastroesophageal reflux.  PHYSICAL EXAMINATION:  She is pain free in no acute distress. Blood pressure is 150/74, heart rate 65, respiratory rate 20. Skin is warm and dry. Color is normal.  Lungs are clear.  Heart shows regular rate and rhythm. Abdomen is soft and nontender.  Extremities without edema.  PERTINENT LABORATORY DATA:  White count 9000. Hemoglobin is only 31 with  a hemoglobin of 11. Platelet count is 336,000.  CKs were negative.  Chemistries were unremarkable.  EKG is unable to locate initially but the EKGs show sinus bradycardia and basically otherwise unremarkable with nonspecific ST-T wave changes.  OVERALL IMPRESSION: 1. Chest pain. 2. Tobacco abuse. 3. Positive family history of heart disease.  PLAN:  Would benefit from proceeding with cardiac catheterization and pulmonary arteriogram.  Procedure risks and benefits have been explained to the patient and will proceed with this on Monday.  Thanks very much for asking me to see her in consultation. DD:  02/08/00 TD:  02/11/00 Job: [id number]

## 2011-01-07 NOTE — H&P (Signed)
NAME:  [name] [name], [name] [name]                           ACCOUNT NO.:  [id number]   MEDICAL RECORD NO.:  [id number]                   PATIENT TYPE:  OIB   LOCATION:                                       FACILITY:  [facility]   PHYSICIAN:  Colleen Can. [name], M.D.            DATE OF BIRTH:  06/30/1927   DATE OF ADMISSION:  06/04/2003  DATE OF DISCHARGE:                                HISTORY & PHYSICAL   CHIEF COMPLAINT:  Shortness of breath, fatigue and jaw pain.   HISTORY OF PRESENT ILLNESS:  The patient is a 77 year old white female who  has multiple medical problems.  She presents to the office for her four  month regular office visit on October 11.  She notes that over the past  three months she has become more short of breath.  She has had a  considerable amount of fatigue.  She notes that she tires rather easily.  She is now having to use a chair in her shower which has been somewhat  unusual for her.  She has previously had headaches and noted that her blood  pressure was elevated.  She subsequently increased her Toprol with  subsequent improvement.  She has continued to have as well intermittent  episodes of jaw pain that has been relieved with nitroglycerin.  She does  have known coronary artery disease with her last catheterization in January  of 2003.  She is now referred for elective cardiac catheterization.   PAST MEDICAL HISTORY:  1. Atherosclerotic cardiovascular disease.  She had two subendocardial     myocardial infarctions with the last being in January of 2003 and then     subsequently again in June of 2003.  Her last catheterization was in     January of 2003 which showed normal left ventricular function and     moderately severe three vessel disease.  She has been managed medically     since that time and basically has done well.  2. Shortness of breath with a previous history of negative CT scanning in     June of 2003.  3. Hyperlipidemia.  4. Prior history of anemia.  5. Hypertension.  6. History of depression.  7. Past tobacco abuse.   ALLERGIES:  CODEINE.   CURRENT MEDICATIONS:  1. Lasix 40 mg daily.  2. Nexium 40 mg daily.  3. Plavix 75 mg daily.  4. Norvasc 5 mg daily.  5. Imdur 60 mg daily.  6. Zocor 10 mg a day.  7. Celexa 20 mg a day.  8. Lopressor 50 mg b.i.d.  9. Aspirin daily.  10.      Advair and Albuterol as needed.   FAMILY HISTORY:  Father died of a heart attack at age 63.  Mother died in  her nineties with congestive heart failure.   SOCIAL HISTORY:  She is a widow.  She has been a non-smoker  since her  original heart attack dating back to June of 2001.  She has no alcohol use.   REVIEW OF SYMPTOMS:  GENERAL:  She notes that she basically feels cold all  the time. She is very sluggish.  She basically stays in the bed most of the  time due to the significant fatigue that she is experiencing.  PSYCHIATRIC:  She actually denies an exacerbation of her depression.  CARDIAC:  She has  been using nitroglycerin with relief of jaw discomfort.  PULMONARY:  She has  been short of breath primarily with minimal exertion.  She has had no cough,  no fever and no flu.  GI:  No constipation.  Otherwise, the review of  systems is as noted above and otherwise unremarkable.   PHYSICAL EXAMINATION:  VITAL SIGNS:  Weight is 140, blood pressure 130/50  sitting and 140/60 standing, heart rate 60 and regular, respirations 18 and  she is afebrile.  SKIN:  Warm and dry.  The color is unremarkable.  LUNGS:  Basically clear.  CARDIOVASCULAR:  The heart shows a regular rhythm without murmur.  ABDOMEN:  Soft with positive bowel sounds and non-tender.  EXTREMITIES:  Without edema.  NEUROLOGIC:  Intact; there are no gross focal deficits.   LABORATORY DATA:  Pertinent laboratories are pending.   IMPRESSION:  1. Multitude of somatic complaints.  2. Known coronary artery disease with last catheterization in January of     2003 with a prior history of  two subendocardial myocardial infarctions.  3. Hypertension.  4. Hyperlipidemia.  5. Past tobacco abuse.   PLAN:  We will proceed on with repeat cardiac catheterization.  The  procedure has been reviewed in full detail and she is willing to proceed on  Wednesday, June 04, 2003.      Juanell Fairly C. Earl Gala, N.P.                 Colleen Can. [name], M.D.    LCO/MEDQ  D:  06/02/2003  T:  06/02/2003  Job:  [id number]   cc:   [name]  [facility]

## 2011-01-07 NOTE — Cardiovascular Report (Signed)
[place]  Patient:    [name] [name], [name] [name] Visit Number: [id number] MRN: [id number]          Service Type: MED Location: [phone] Attending Physician:  Eleanora Neighbor Dictated by:   Colleen Can. [name], M.D. Proc. Date: 09/18/01 Admit Date:  09/17/2001 Discharge Date: 09/20/2001   CC:         Pearla Dubonnet, M.D.  [facility]   Cardiac Catheterization  HISTORY: The patient presented with a subendocardial infarction. She had a history of catheterization in June of 2001 that showed moderate proximal coronary disease. We elected to manage her medically at that point in time. She was admitted and had positive cardiac enzymes and is now referred for catheterization.  PROCEDURE: Left heart catheterization with selective coronary angiography, left ventricular angiography.  TYPE AND SITE OF ENTRY: Percutaneous right femoral artery with Perclose.  CATHETERS: A 6 French 4 curved Judkins right and left coronary catheters, 6 French pigtail ventriculographic catheter.  CONTRAST MATERIAL: Omnipaque.  MEDICATIONS GIVEN DURING THE PROCEDURE: None.  MEDICATIONS GIVEN PRIOR TO THE PROCEDURE: Valium 10 mg p.o.  COMMENTS: The patient tolerated the procedure well.  HEMODYNAMIC DATA: The LV pressure is 160/14. Aortic pressure is 160/46. There is no aortic valve gradient noted on pullback.  ANGIOGRAPHIC DATA:  LEFT VENTRICULOGRAPHY: The left ventricular angiogram was performed in the RAO position.  Overall cardiac size and silhouette are normal.  Left ventricular function is normal.  The global ejection fraction would be estimated to be 70%. There is no abnormal regional wall motions, no mitral regurgitation, no intracavitary filling defect.  CORONARY ARTERIES: Coronary arteries and distribute normally. There is a large dominant right system. 1. Right coronary artery: The right coronary artery is a very large dominant    system with a  large posterior descending and posterolateral branch. There    is 30-40% narrowing in the proximal right coronary artery and a somewhat    hypodense area in the mid right coronary artery with approximately 20-30%    narrowing. Flow is brisk and prompt. 2. Left main coronary artery: The left main coronary artery is essentially    normal. 3. Left circumflex: The left circumflex arises and there are medially    three branches. There is a larger obtuse marginal that has a 30-40%    ostial narrowing. There is a smaller intermediate type vessel which    has 50-70% segmental narrowing. There is a smaller continuation branch in    the AV groove, which has 20-30% narrowing. 4. Left anterior descending: The left anterior descending is only a moderate    sized vessel. It ends on the anterior wall. It has 50-60% segmental    narrowing ostially and proximally. There are some irregularities    in the remainder of the vessel but no real significant focal disease.  OVERALL IMPRESSION: 1. Normal left ventricular function. 2. Moderately severe three-vessel coronary artery disease (60% segmental    proximal left anterior descending, 60-70% small intermediate, 30-50%    left circumflex, 20-30% right coronary artery).  DISCUSSION: Angiograms alone would suggest that the patient can be managed medically. However, she clearly has had a subendocardial infarction and needs to be considered at high risk, especially with this diffuse disease and it is almost certain that she does have somewhat of an unstable plaque. We will initially try to manage her medically. If that is unsuccessful, she is a candidate for bypass grafting. Dictated by:   Colleen Can [name],  M.D. Attending Physician:  Eleanora Neighbor DD:  09/18/01 TD:  09/18/01 Job: [id number]

## 2011-01-07 NOTE — Op Note (Signed)
NAME:  [name] [name], [name] [name]                            ACCOUNT NO.:  [id number]   MEDICAL RECORD NO.:  [id number]                   PATIENT TYPE:  AMB   LOCATION:  DSC                                  FACILITY:  [facility]   PHYSICIAN:  Artist Pais. Mina Marble, M.D.           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   PROCEDURE:  Right thumb carpometacarpal suspensionplasty with abductor  pollicis longus tendon transfer abductor pollicis longus tendon transfer  from the wrist and a volar cyst excision.   SURGEON:  Artist Pais. Mina Marble, M.D.   ASSISTANT:  Aura Fey. Bobbe Medico.   ANESTHESIA:  Axillary block.   TOURNIQUET TIME:  1 hour 15 minutes.   COMPLICATIONS:  None.   DRAINS:  None.   DESCRIPTION OF PROCEDURE:  The patient was taken to the operating room.  After the induction of adequate axillary block analgesia, the right upper  extremity was prepped and draped in the usual sterile fashion.  An Esmarch  was used to exsanguinate the limb.  Tourniquet was inflated to 250 mmHg.  At  this point in time, a J-shaped incision was made over the thenar eminence of  the right thumb, and a large volar based flap was elevated.  The thenar  muscles were carefully dissected off the CMC capsule, and a CMC capsulotomy  was performed. At this point in time, the trapezium was moved in piecemeal  using combination of osteotomes, curets, and rongeurs.  Once the  trapeziectomy was performed, the [facility] synovectomy was performed.  Next, the  dissection was carried to the dorsum-most extent of the wound proximally  where cystic mass was seen overlying the FCR tendon area.  This was excised  in its entirety and  sent for pathologic confirmation.  Next, a transosseous  canal was created in the thumb, starting at the articular surface in the  midline and exiting dorsally in the midline.  This was done using  combination of bone awl and sequential hand drillings.  After this was done,  through a second incision dorsally at the base of the  index metacarpal, a  second transosseous canal was created from dorsal to volar using the same  technique.  The wound was then thoroughly irrigated, and a third incision  was then made transversely in the area of the muscular tendinous junction of  the first dorsal compartment, and the APL tendon, muscular tendinous  junction was identified and transected.  This was drawn into the original  wound.  At this point in time, the APL tendon was then transferred from  dorsal to volar to the thumb and from volar to dorsal  to the index finger  and tied via a Pulvertaft weave into the extensor carpi radialis brevis  insertion.  After this was done, the wounds were thoroughly irrigated.  The  thenar  muscles were repaired with 4-0 Vicryl, and the three skin incisions were  repaired with a running 3-0 Prolene subcuticular stitch. Steri-Strips, 4 x 4  fluffs, and compressive hand dressing and __________ splint was applied.  The patient tolerated the procedure well and discharged in stable fashion.                                               Artist Pais Mina Marble, M.D.    MAW/MEDQ  D:  07/09/2003  T:  07/09/2003  Job:  [id number]

## 2011-02-03 ENCOUNTER — Other Ambulatory Visit: Payer: Self-pay | Admitting: Cardiology

## 2011-02-03 NOTE — Telephone Encounter (Deleted)
Need to confirm with dr first.

## 2011-02-03 NOTE — Telephone Encounter (Signed)
Refilled confirmed with pharmacy, pt been on for one year, will give to kelly rn to see if labs are needed. Alfonso Ramus RN

## 2011-02-21 ENCOUNTER — Other Ambulatory Visit (HOSPITAL_COMMUNITY): Payer: Self-pay | Admitting: Oncology

## 2011-02-21 ENCOUNTER — Encounter (HOSPITAL_BASED_OUTPATIENT_CLINIC_OR_DEPARTMENT_OTHER): Payer: 59 | Admitting: Oncology

## 2011-02-21 DIAGNOSIS — D649 Anemia, unspecified: Secondary | ICD-10-CM

## 2011-02-21 DIAGNOSIS — N289 Disorder of kidney and ureter, unspecified: Secondary | ICD-10-CM

## 2011-02-21 LAB — CBC WITH DIFFERENTIAL/PLATELET
BASO%: 0.6 % (ref 0.0–2.0)
HCT: 32.8 % — ABNORMAL LOW (ref 34.8–46.6)
MCHC: 34.2 g/dL (ref 31.5–36.0)
MONO#: 0.6 10*3/uL (ref 0.1–0.9)
NEUT%: 48.6 % (ref 38.4–76.8)
RDW: 13.4 % (ref 11.2–14.5)
WBC: 6.7 10*3/uL (ref 3.9–10.3)
lymph#: 2.6 10*3/uL (ref 0.9–3.3)

## 2011-02-25 LAB — LACTATE DEHYDROGENASE: LDH: 138 U/L (ref 94–250)

## 2011-02-25 LAB — TRANSFERRIN RECEPTOR, SOLUABLE: Transferrin Receptor, Soluble: 1.56 mg/L (ref 0.76–1.76)

## 2011-02-25 LAB — IRON AND TIBC
%SAT: 21 % (ref 20–55)
TIBC: 270 ug/dL (ref 250–470)

## 2011-02-25 LAB — COMPREHENSIVE METABOLIC PANEL
ALT: 13 U/L (ref 0–35)
AST: 22 U/L (ref 0–37)
Creatinine, Ser: 1.18 mg/dL — ABNORMAL HIGH (ref 0.50–1.10)
Total Bilirubin: 0.4 mg/dL (ref 0.3–1.2)

## 2011-03-04 ENCOUNTER — Other Ambulatory Visit: Payer: Self-pay | Admitting: *Deleted

## 2011-03-04 MED ORDER — CLOPIDOGREL BISULFATE 75 MG PO TABS: 75.0000 mg | ORAL_TABLET | Freq: Every day | ORAL | Status: DC

## 2011-03-04 NOTE — Telephone Encounter (Signed)
Fax received from pharmacy. Refill completed. Jodette Gomer France RN  

## 2011-05-11 ENCOUNTER — Ambulatory Visit (INDEPENDENT_AMBULATORY_CARE_PROVIDER_SITE_OTHER): Payer: PRIVATE HEALTH INSURANCE | Admitting: Physician Assistant

## 2011-05-11 ENCOUNTER — Encounter: Payer: Self-pay | Admitting: Physician Assistant

## 2011-05-11 DIAGNOSIS — R42 Dizziness and giddiness: Secondary | ICD-10-CM

## 2011-05-11 DIAGNOSIS — I2510 Atherosclerotic heart disease of native coronary artery without angina pectoris: Secondary | ICD-10-CM

## 2011-05-11 DIAGNOSIS — I951 Orthostatic hypotension: Secondary | ICD-10-CM

## 2011-05-11 DIAGNOSIS — I10 Essential (primary) hypertension: Secondary | ICD-10-CM

## 2011-05-11 MED ORDER — VALSARTAN 160 MG PO TABS: 80.0000 mg | ORAL_TABLET | Freq: Every day | ORAL | Status: DC

## 2011-05-11 MED ORDER — FUROSEMIDE 40 MG PO TABS: 20.0000 mg | ORAL_TABLET | ORAL | Status: DC | PRN

## 2011-05-11 NOTE — Assessment & Plan Note (Addendum)
Patient has a history of coronary artery disease as listed above in the overview. She does have angina approximately 2 times a month if she overdoes it. This has not changed in frequency or duration. Her last stress Myoview was in May 2011 which was normal. I will hold off on further testing because it has been stable. She has an appointment with Dr. Shirlee Latch October 4.

## 2011-05-11 NOTE — Patient Instructions (Signed)
Your physician recommends that you schedule a follow-up appointment on 10/4 with Dr Shirlee Latch  Your physician recommends that you return for lab work on 10/4 [facility]) Your physician has recommended you make the following change in your medication: DECREASE Diovan to 80 mg daily and Furosemide to 20 mg daily

## 2011-05-11 NOTE — Assessment & Plan Note (Signed)
Dizziness is felt secondary to orthostatic hypotension. Have adjusted her medicines.

## 2011-05-11 NOTE — Assessment & Plan Note (Signed)
The patient complains of several week history of dizziness after she takes her medications she is orthostatic in the office today. Her blood pressure dropped from 122 down to 102 systolic and she was dizzy. I will decrease her Lasix to 20 mg daily and decrease her Diovan to 80 mg daily. She is to call she has continued dizziness. We will also repeat bmet at her next office visit.

## 2011-05-11 NOTE — Progress Notes (Signed)
HPI:  This is an 77 year old patient who previously saw Dr. [name] and has a history of coronary artery disease dating back to 2004 which time she had a cardiac catheterization.She had normal left ventricular function with moderately severe ostial left anterior descending disease with proximal disease present,  moderate intermediate disease of 50-60% narrowing, mild proximal right coronary artery disease and minimal, if any, left circumflex disease.   Her last stress Cardiolite was in May 2011 which was normal and ejection fraction was 74%.  The patient also has history of seizure disorders and chronic iron deficiency aemia.  The patient complains of several week history of recurrent dizziness. She says she takes her medicine about 10:30 in the morning and then takes a nap about 1:30. When she wakes up from her nap she is so dizzy she can hardly continue on with her day and has to go to bed early. She saw Dr. Cyndia Bent last week with dizziness and says her blood pressure was low. He did blood work and put her on vitamin D. Her potassium was up to 5.5 and creatinine 1.31. Her TSH was slightly elevated at 5.2.  The patient also has occasional chest pain requiring nitroglycerin. She says she gets very tight in her chest and short of breath and takes a nitroglycerin and laid down. This occurs approximately twice a month when she overdoes it. She is 20 and her son lives with her currently.    Allergies  Allergen Reactions  . Codeine     Current Outpatient Prescriptions on File Prior to Visit  Medication Sig Dispense Refill  . budesonide-formoterol (SYMBICORT) 160-4.5 MCG/ACT inhaler Inhale 2 puffs into the lungs 2 (two) times daily.        . clopidogrel (PLAVIX) 75 MG tablet Take 1 tablet (75 mg total) by mouth daily.  30 tablet  4  . esomeprazole (NEXIUM) 40 MG capsule Take 40 mg by mouth daily before breakfast.        . furosemide (LASIX) 40 MG tablet Take 40 mg by mouth as needed.        .  isosorbide mononitrate (IMDUR) 30 MG 24 hr tablet Take 30 mg by mouth daily.        . LevETIRAcetam (KEPPRA PO) Take by mouth 2 (two) times daily.        . pravastatin (PRAVACHOL) 20 MG tablet TAKE 1 TABLET EACH DAY  30 tablet  5  . pregabalin (LYRICA) 75 MG capsule Take 75 mg by mouth at bedtime.       . valsartan (DIOVAN) 160 MG tablet Take 160 mg by mouth daily.          Past Medical History  Diagnosis Date  . SOB (shortness of breath)   . Fatigue   . Anxiety   . Seizure   . Dizziness   . Hot flashes   . Ischemic heart disease   . Hypertension   . Hyperlipidemia   . Coronary artery disease   . Anemia   . Syncope and collapse     RECURRENT, RELATED TO HYPONATREMIA, NARCOTICS, AND BRADYCARDIA  . Tobacco abuse     PAST  . Memory disorder   . Pneumonia     Past Surgical History  Procedure Date  . Cardiac catheterization 2004    Family History  Problem Relation Age of Onset  . Heart attack Father   . Heart failure Mother     History   Social History  . Marital Status: Widowed  Spouse Name: N/A    Number of Children: N/A  . Years of Education: N/A   Occupational History  . Not on file.   Social History Main Topics  . Smoking status: Former Smoker    Quit date: 08/23/1999  . Smokeless tobacco: Not on file  . Alcohol Use: No  . Drug Use: No  . Sexually Active:    Other Topics Concern  . Not on file   Social History Narrative  . No narrative on file  Cardiac Cath 2004: OVERALL IMPRESSION:  1. Normal left ventricular function.  2. Moderately severe ostial left anterior descending disease with proximal  disease present, moderate intermediate disease of 50-60% narrowing, mild  proximal right coronary artery disease and minimal, if any, left  circumflex disease.  DISCUSSION: It is felt that we need to at least consider Karolynn as a  candidate for coronary artery bypass grafting. She would not be a candidate  for intervention on the ostium of the left  anterior descending given the  complex bifurcation of this vessel at this location and its involvement of  the intermediate and left circumflex. It is felt that a stent would cause  occlusion and snow plow into the other proximal branches. Maybe the  decision for surgery needs to be further supported by functional studies in  addition her symptoms of significant angina.    ROS: See HPI Eyes: Negative Ears:positive for hearing loss,negative for tinnitus Cardiovascular: Negative for  palpitations,irregular heartbeat, dyspnea, near-syncope, orthopnea, paroxysmal nocturnal dyspnia and syncope,edema, claudication, cyanosis,.  Respiratory:   Negative for cough, hemoptysis, shortness of breath, sleep disturbances due to breathing, sputum production and wheezing.   Endocrine: Negative for cold intolerance and heat intolerance.  Hematologic/Lymphatic: Negative for adenopathy and bleeding problem. Does not bruise/bleed easily.  Musculoskeletal: arthritis.   Gastrointestinal: Negative for nausea, vomiting, reflux, abdominal pain, diarrhea, constipation.   Neurological: Negative. No recent seizures Allergic/Immunologic: Negative for environmental allergies.   PHYSICAL EXAM: Well-nournished, in no acute distress. Neck: No JVD, HJR, Bruit, or thyroid enlargement Lungs: No tachypnea, clear without wheezing, rales, or rhonchi Cardiovascular: RRR, PMI not displaced, heart sounds normal, no murmurs, gallops, bruit, thrill, or heave. Abdomen: BS normal. Soft without organomegaly, masses, lesions or tenderness. Extremities: without cyanosis, clubbing or edema. Good distal pulses bilateral SKin: Warm, no lesions or rashes  Musculoskeletal: No deformities Neuro: no focal signs  BP 110/70  Pulse 67  Ht 5\' 2"  (1.575 m)  Wt 140 lb (63.504 kg)  BMI 25.61 kg/m2  ZOX:WRUEAV sinus rhythm no acute change

## 2011-05-25 ENCOUNTER — Encounter: Payer: Self-pay | Admitting: Physician Assistant

## 2011-05-26 ENCOUNTER — Ambulatory Visit (INDEPENDENT_AMBULATORY_CARE_PROVIDER_SITE_OTHER): Payer: PRIVATE HEALTH INSURANCE | Admitting: Cardiology

## 2011-05-26 ENCOUNTER — Encounter: Payer: Self-pay | Admitting: Cardiology

## 2011-05-26 VITALS — BP 169/75 | HR 75 | Ht 63.0 in | Wt 142.0 lb

## 2011-05-26 DIAGNOSIS — R0602 Shortness of breath: Secondary | ICD-10-CM

## 2011-05-26 DIAGNOSIS — I951 Orthostatic hypotension: Secondary | ICD-10-CM

## 2011-05-26 DIAGNOSIS — E785 Hyperlipidemia, unspecified: Secondary | ICD-10-CM

## 2011-05-26 DIAGNOSIS — I2510 Atherosclerotic heart disease of native coronary artery without angina pectoris: Secondary | ICD-10-CM

## 2011-05-26 LAB — BRAIN NATRIURETIC PEPTIDE: Pro B Natriuretic peptide (BNP): 277 pg/mL — ABNORMAL HIGH (ref 0.0–100.0)

## 2011-05-26 LAB — LIPID PANEL
Cholesterol: 172 mg/dL (ref 0–200)
LDL Cholesterol: 86 mg/dL (ref 0–99)
Triglycerides: 139 mg/dL (ref 0.0–149.0)

## 2011-05-26 LAB — BASIC METABOLIC PANEL
BUN: 10 mg/dL (ref 6–23)
CO2: 25 mEq/L (ref 19–32)
Chloride: 106 mEq/L (ref 96–112)
Creatinine, Ser: 1 mg/dL (ref 0.4–1.2)
Glucose, Bld: 83 mg/dL (ref 70–99)
Potassium: 3.8 mEq/L (ref 3.5–5.1)

## 2011-05-26 NOTE — Patient Instructions (Signed)
Your physician recommends that you have  lab work today--lipid profile/BMP/BNP 414.01  Take and record your blood pressure. I will call you in 2 weeks to get the readings. Luana Shu [phone]  Your physician recommends that you schedule a follow-up appointment in: 2 months with Dr Shirlee Latch.

## 2011-05-27 NOTE — Assessment & Plan Note (Signed)
Stable with no ischemic-type chest pain.  She will continue Plavix, Imdur, ARB, and statin.  She has not been on aspirin due to GI upset.

## 2011-05-27 NOTE — Assessment & Plan Note (Signed)
Check lipids/LFTs with goal LDL < 70.  

## 2011-05-27 NOTE — Assessment & Plan Note (Addendum)
Patient has stable DOE with moderate exertion. She is actually not taking Lasix regularly now.  Checking BNP today.

## 2011-05-27 NOTE — Assessment & Plan Note (Addendum)
She is not orthostatic today after medication adjustment.  BP was initially high but was within normal range on re-check.  I am going to have her record her BP daily for 2 weeks and we will give her a call to see what it has been running at home.  Creatinine and K were elevated when checked in 9/12.  Now that we have cut back on valsartan and she is taking Lasix only prn, will repeat BMET today.

## 2011-05-27 NOTE — Progress Notes (Signed)
PCP: Dr. Cyndia Bent  77 yo with history of CAD and seizure disorder presents for cardiology followup.  Patient has been seen by Dr. [name] in the past and is seen by me for the first time today. Patient was seen last month by the PA because of positional lightheadedness.  She was found to be orthostatic and her Diovan dose was decreased.  Currently, she gets mildly lightheaded if she stands fast but symptoms are overall improved.  She was not orthostatic when we checked in the office today.    In general, she has been doing well since her Diovan was decreased.  She can walk about 200 feet before getting fatigued and having to stop.  She has very rare atypical chest pain.  Her current anti-seizure medication regimen seems to be working well, she has not had a recent seizure.  BP was elevated initially today but was 137/51 when rechecked.    Labs (9/12): K 5.5, creatinine 1.3, TSH mildly elevated   PMH: 1.  Seizure disorder 2.  CAD: Left heart cath in 2004 showed 70% ostial LAD stenosis and 50-60% proximal ramus disease.  It was decided to manage her medically.  Myoview (5/11) showed EF 74% and was a normal study.  Echo (6/08) showed EF 55-60% with grade I diastolic dysfunction, mild LAE.  3. HTN 4. Hyperlipidemia 5. GERD 6. Chronic Fe deficiency anemia.  SH: Lives in [place], 1 son lives with her (has 2 sons).  Prior smoker.   FH: Parents with CAD  ROS: All systems reviewed and negative except as per HPI.   Current Outpatient Prescriptions  Medication Sig Dispense Refill  . budesonide-formoterol (SYMBICORT) 160-4.5 MCG/ACT inhaler Inhale 2 puffs into the lungs 2 (two) times daily.        . clopidogrel (PLAVIX) 75 MG tablet Take 1 tablet (75 mg total) by mouth daily.  30 tablet  4  . esomeprazole (NEXIUM) 40 MG capsule Take 40 mg by mouth daily before breakfast.        . furosemide (LASIX) 40 MG tablet Take 0.5 tablets (20 mg total) by mouth as needed.      . isosorbide mononitrate (IMDUR)  30 MG 24 hr tablet Take 30 mg by mouth daily.        . LevETIRAcetam (KEPPRA PO) Take by mouth 2 (two) times daily.        . pravastatin (PRAVACHOL) 20 MG tablet TAKE 1 TABLET EACH DAY  30 tablet  5  . pregabalin (LYRICA) 75 MG capsule Take 75 mg by mouth at bedtime.       . sertraline (ZOLOFT) 100 MG tablet Take by mouth daily.        . valsartan (DIOVAN) 160 MG tablet Take 0.5 tablets (80 mg total) by mouth daily.        BP 169/75  Pulse 75  Ht 5\' 3"  (1.6 m)  Wt 142 lb (64.411 kg)  BMI 25.15 kg/m2 General: NAD, elderly Neck: No JVD, no thyromegaly or thyroid nodule.  Lungs: Clear to auscultation bilaterally with normal respiratory effort. CV: Nondisplaced PMI.  Heart regular S1/S2, no S3/S4, 1/6 early SEM.  No peripheral edema.  No carotid bruit.  Normal pedal pulses.  Abdomen: Soft, nontender, no hepatosplenomegaly, no distention.  Neurologic: Alert and oriented x 3.  Psych: Normal affect. Extremities: No clubbing or cyanosis.

## 2011-05-30 ENCOUNTER — Telehealth: Payer: Self-pay | Admitting: *Deleted

## 2011-05-30 DIAGNOSIS — E785 Hyperlipidemia, unspecified: Secondary | ICD-10-CM

## 2011-05-30 DIAGNOSIS — I2510 Atherosclerotic heart disease of native coronary artery without angina pectoris: Secondary | ICD-10-CM

## 2011-05-30 MED ORDER — PRAVASTATIN SODIUM 40 MG PO TABS: 40.0000 mg | ORAL_TABLET | Freq: Every evening | ORAL | Status: DC

## 2011-05-30 NOTE — Telephone Encounter (Signed)
Notes Recorded by Jacqlyn Krauss, RN on 05/30/2011 at 6:14 PM I discussed with pt. She will increase pravastatin to 40mg  daily and return for fasting lipid/liver profile 08/02/11. Notes Recorded by Marca Ancona, MD on 05/27/2011 at 4:43 PM LDL is too high, would increase pravastatin to 40 mg daily. Repeat lipids/LFTs in 2 months. BNP is a bit elevated but not very symptomatic so would encourage her to watch her sodium intake closely.

## 2011-06-08 LAB — BASIC METABOLIC PANEL
BUN: 10
CO2: 25
Chloride: 97
Creatinine, Ser: 1.02
Glucose, Bld: 91
Potassium: 4.8

## 2011-06-08 LAB — TROPONIN I: Troponin I: 0.02

## 2011-06-08 LAB — URINE CULTURE: Colony Count: 35000

## 2011-06-08 LAB — URINALYSIS, ROUTINE W REFLEX MICROSCOPIC
Bilirubin Urine: NEGATIVE
Glucose, UA: NEGATIVE
Hgb urine dipstick: NEGATIVE
Ketones, ur: NEGATIVE
Nitrite: NEGATIVE
Protein, ur: NEGATIVE
Specific Gravity, Urine: 1.013
Urobilinogen, UA: 0.2
pH: 7

## 2011-06-08 LAB — CBC
HCT: 32.9 — ABNORMAL LOW
HCT: 34.7 — ABNORMAL LOW
HCT: 36.7
Hemoglobin: 11.2 — ABNORMAL LOW
Hemoglobin: 11.6 — ABNORMAL LOW
Hemoglobin: 12.4
MCHC: 33.5
MCHC: 33.7
MCHC: 34
MCV: 89.1
MCV: 89.8
MCV: 90.4
Platelets: 261
Platelets: 300
Platelets: 303
RBC: 3.67 — ABNORMAL LOW
RBC: 3.84 — ABNORMAL LOW
RBC: 4.11
RDW: 12.8
RDW: 12.9
RDW: 13.3
WBC: 6.1
WBC: 7.8
WBC: 8.5

## 2011-06-08 LAB — COMPREHENSIVE METABOLIC PANEL
AST: 24
BUN: 12
CO2: 27
Calcium: 9.4
Chloride: 97
Creatinine, Ser: 1.1
GFR calc Af Amer: 58 — ABNORMAL LOW
GFR calc non Af Amer: 48 — ABNORMAL LOW
Total Bilirubin: 0.8

## 2011-06-08 LAB — CK TOTAL AND CKMB (NOT AT ARMC)
Relative Index: 1.2
Total CK: 110

## 2011-06-08 LAB — COMPREHENSIVE METABOLIC PANEL WITH GFR
ALT: 13
AST: 29
Albumin: 3.7
Alkaline Phosphatase: 73
BUN: 7
CO2: 25
Calcium: 9
Chloride: 99
Creatinine, Ser: 0.86
GFR calc non Af Amer: >60
Glucose, Bld: 130 — ABNORMAL HIGH
Potassium: 4.6
Sodium: 132 — ABNORMAL LOW
Total Bilirubin: 0.9
Total Protein: 6.6

## 2011-06-08 LAB — DIFFERENTIAL
Basophils Absolute: 0
Eosinophils Relative: 2
Lymphocytes Relative: 22
Lymphs Abs: 1.8
Neutro Abs: 5.9

## 2011-06-08 LAB — TSH: TSH: 0.871

## 2011-06-08 LAB — LIPASE, BLOOD: Lipase: 27

## 2011-08-02 ENCOUNTER — Other Ambulatory Visit: Payer: PRIVATE HEALTH INSURANCE | Admitting: *Deleted

## 2011-08-04 ENCOUNTER — Other Ambulatory Visit: Payer: Self-pay | Admitting: *Deleted

## 2011-08-04 ENCOUNTER — Ambulatory Visit: Payer: PRIVATE HEALTH INSURANCE | Admitting: Cardiology

## 2011-08-04 MED ORDER — CLOPIDOGREL BISULFATE 75 MG PO TABS: 75.0000 mg | ORAL_TABLET | Freq: Every day | ORAL | Status: DC

## 2011-08-20 ENCOUNTER — Telehealth: Payer: Self-pay | Admitting: Oncology

## 2011-08-20 NOTE — Telephone Encounter (Signed)
S/w pt's son re appt for 1/17 @ 10 am.

## 2011-08-29 ENCOUNTER — Other Ambulatory Visit: Payer: PRIVATE HEALTH INSURANCE | Admitting: *Deleted

## 2011-09-01 ENCOUNTER — Ambulatory Visit: Payer: PRIVATE HEALTH INSURANCE | Admitting: Cardiology

## 2011-09-08 ENCOUNTER — Ambulatory Visit: Payer: PRIVATE HEALTH INSURANCE | Admitting: Physician Assistant

## 2011-09-08 ENCOUNTER — Other Ambulatory Visit: Payer: PRIVATE HEALTH INSURANCE | Admitting: Lab

## 2011-09-13 ENCOUNTER — Other Ambulatory Visit (INDEPENDENT_AMBULATORY_CARE_PROVIDER_SITE_OTHER): Payer: PRIVATE HEALTH INSURANCE | Admitting: *Deleted

## 2011-09-13 DIAGNOSIS — I2510 Atherosclerotic heart disease of native coronary artery without angina pectoris: Secondary | ICD-10-CM

## 2011-09-13 DIAGNOSIS — I10 Essential (primary) hypertension: Secondary | ICD-10-CM

## 2011-09-13 DIAGNOSIS — E785 Hyperlipidemia, unspecified: Secondary | ICD-10-CM

## 2011-09-13 LAB — LIPID PANEL
Cholesterol: 181 mg/dL (ref 0–200)
HDL: 61.2 mg/dL (ref >39.00)
Triglycerides: 151 mg/dL — ABNORMAL HIGH (ref 0.0–149.0)
VLDL: 30.2 mg/dL (ref 0.0–40.0)

## 2011-09-13 LAB — HEPATIC FUNCTION PANEL
Albumin: 4.3 g/dL (ref 3.5–5.2)
Alkaline Phosphatase: 60 U/L (ref 39–117)

## 2011-09-13 LAB — BASIC METABOLIC PANEL
BUN: 11 mg/dL (ref 6–23)
CO2: 26 mEq/L (ref 19–32)
Calcium: 9.3 mg/dL (ref 8.4–10.5)
Creatinine, Ser: 1.2 mg/dL (ref 0.4–1.2)
GFR: 46.53 mL/min — ABNORMAL LOW (ref >60.00)
Glucose, Bld: 92 mg/dL (ref 70–99)

## 2011-09-19 ENCOUNTER — Ambulatory Visit (INDEPENDENT_AMBULATORY_CARE_PROVIDER_SITE_OTHER): Payer: PRIVATE HEALTH INSURANCE | Admitting: Cardiology

## 2011-09-19 ENCOUNTER — Encounter: Payer: Self-pay | Admitting: Cardiology

## 2011-09-19 DIAGNOSIS — I2510 Atherosclerotic heart disease of native coronary artery without angina pectoris: Secondary | ICD-10-CM

## 2011-09-19 DIAGNOSIS — I951 Orthostatic hypotension: Secondary | ICD-10-CM

## 2011-09-19 DIAGNOSIS — E785 Hyperlipidemia, unspecified: Secondary | ICD-10-CM

## 2011-09-19 DIAGNOSIS — K219 Gastro-esophageal reflux disease without esophagitis: Secondary | ICD-10-CM

## 2011-09-19 DIAGNOSIS — R0602 Shortness of breath: Secondary | ICD-10-CM

## 2011-09-19 MED ORDER — VALSARTAN 40 MG PO TABS: 40.0000 mg | ORAL_TABLET | Freq: Every day | ORAL | Status: DC

## 2011-09-19 MED ORDER — ISOSORBIDE MONONITRATE ER 30 MG PO TB24: ORAL_TABLET | ORAL | Status: DC

## 2011-09-19 MED ORDER — FUROSEMIDE 20 MG PO TABS: 20.0000 mg | ORAL_TABLET | Freq: Every day | ORAL | Status: DC

## 2011-09-19 MED ORDER — PRAVASTATIN SODIUM 80 MG PO TABS: 80.0000 mg | ORAL_TABLET | Freq: Every evening | ORAL | Status: DC

## 2011-09-19 NOTE — Assessment & Plan Note (Signed)
Patient has GERD-type symptoms when taking fish oil.  It would be ok to send him to stop fish oil.

## 2011-09-19 NOTE — Assessment & Plan Note (Signed)
Stable with no ischemic-type chest pain.  She will continue Plavix, Imdur, ARB, and statin.  She has not been on aspirin due to GI upset.  

## 2011-09-19 NOTE — Progress Notes (Signed)
PCP: Dr. Cyndia Bent  78 yo with history of CAD and seizure disorder presents for cardiology followup.  She has had ongoing problems with dizziness  She was found to be orthostatic at past appointments and her Diovan dose was from 160 mg daily to 80 mg daily.  She is still have problems with lightheadedness with standing.  She was orthostatic today when we checked.    She has been more short of breath with exertion for the last several days.  It is hard for her to characterize this.  She can walk around the house without problems.  However, at times she feels short of breath when talking.  She feels fatigued with a general lack of energy.  She feels "drunk" after taking her medications.  She has GERD-like symptoms with burping and the taste acid in her mouth.  She seems to be able to link this to taking fish oil.  No chest pain.   Labs (9/12): K 5.5, creatinine 1.3, TSH mildly elevated  Labs (10/12); BNP 277 Labs (1/13): LDL 90, HDL 61, K 5, creatinine 1.2  PMH: 1.  Seizure disorder 2.  CAD: Left heart cath in 2004 showed 70% ostial LAD stenosis and 50-60% proximal ramus disease.  It was decided to manage her medically.  Myoview (5/11) showed EF 74% and was a normal study.   3. Chronic diastolic CHF: Echo (6/08) showed EF 55-60% with grade I diastolic dysfunction, mild LAE.  4. HTN 5. Hyperlipidemia 6. GERD 7. Chronic Fe deficiency anemia.  SH: Lives in [place], 1 son lives with her (has 2 sons).  Prior smoker.   FH: Parents with CAD  ROS: All systems reviewed and negative except as per HPI.   Current Outpatient Prescriptions  Medication Sig Dispense Refill  . budesonide-formoterol (SYMBICORT) 160-4.5 MCG/ACT inhaler Inhale 2 puffs into the lungs 2 (two) times daily.        . clopidogrel (PLAVIX) 75 MG tablet Take 1 tablet (75 mg total) by mouth daily.  30 tablet  4  . esomeprazole (NEXIUM) 40 MG capsule Take 40 mg by mouth daily before breakfast.        . LevETIRAcetam (KEPPRA PO) Take  by mouth 2 (two) times daily.        . multivitamin (THERAGRAN) per tablet Take 1 tablet by mouth daily.      . pregabalin (LYRICA) 75 MG capsule Take 75 mg by mouth at bedtime.       . sertraline (ZOLOFT) 100 MG tablet Take by mouth daily.        Marland Kitchen DISCONTD: furosemide (LASIX) 40 MG tablet Take 0.5 tablets (20 mg total) by mouth as needed.      Marland Kitchen DISCONTD: isosorbide mononitrate (IMDUR) 30 MG 24 hr tablet Take 30 mg by mouth daily.        Marland Kitchen DISCONTD: pravastatin (PRAVACHOL) 40 MG tablet Take 1 tablet (40 mg total) by mouth every evening.  30 tablet  3  . DISCONTD: valsartan (DIOVAN) 160 MG tablet Take 0.5 tablets (80 mg total) by mouth daily.      . furosemide (LASIX) 20 MG tablet Take 1 tablet (20 mg total) by mouth daily.  30 tablet  6  . isosorbide mononitrate (IMDUR) 30 MG 24 hr tablet Take 1/2 tablet daily  15 tablet  6  . pravastatin (PRAVACHOL) 80 MG tablet Take 1 tablet (80 mg total) by mouth every evening.  30 tablet  6  . valsartan (DIOVAN) 40 MG tablet Take 1 tablet (  40 mg total) by mouth daily.  30 tablet  6    BP 145/72  Pulse 77  Ht 5' (1.524 m)  Wt 61.689 kg (136 lb)  BMI 26.56 kg/m2 General: NAD, elderly Neck: JVP 8 cm, no thyromegaly or thyroid nodule.  Lungs: Clear to auscultation bilaterally with normal respiratory effort. CV: Nondisplaced PMI.  Heart regular S1/S2, no S3/S4, 1/6 early SEM.  No peripheral edema.  No carotid bruit.  Normal pedal pulses.  Abdomen: Soft, nontender, no hepatosplenomegaly, no distention.  Neurologic: Alert and oriented x 3.  Psych: Normal affect. Extremities: No clubbing or cyanosis.

## 2011-09-19 NOTE — Assessment & Plan Note (Signed)
I suspect that there is a component of diastolic CHF.  I would like her to take Lasix 20 mg daily every day (has 20 mg for use as needed).  I will check BMET/BNP in 2 weeks.

## 2011-09-19 NOTE — Assessment & Plan Note (Signed)
Patient is fatigued in general, feels "drunk" after taking her medications, and is orthostatic on evaluation in our office.  I will decrease her valsartan to 40 mg daily and will decrease her Imdur to 15 mg daily.

## 2011-09-19 NOTE — Assessment & Plan Note (Signed)
Goal LDL < 70.  Increase pravastatin to 80 mg daily with lipids/LFTs in 2 months.

## 2011-09-19 NOTE — Patient Instructions (Addendum)
Increase pravastatin to 80mg  daily in the evening. You can take two 40mg  tablets at the same time in the evening.  Increase Lasix(furosemide) to 20mg  daily in the morning.  Decrease Diovan(valsartan) to 40 mg daily.  Decrease Imdur(isosorbide) to 15mg  daily. This will be one-half 30mg  tablet daily.  Stop fish oil.   Your physician recommends that you return for lab work in: 2 weeks--BMET/BNP   Your physician recommends that you return for a FASTING lipid profile/liver profile in 2 months when you see Dr Shirlee Latch   Your physician recommends that you schedule a follow-up appointment in: 2 months with Dr Shirlee Latch

## 2011-10-03 ENCOUNTER — Other Ambulatory Visit (INDEPENDENT_AMBULATORY_CARE_PROVIDER_SITE_OTHER): Payer: PRIVATE HEALTH INSURANCE | Admitting: *Deleted

## 2011-10-03 DIAGNOSIS — R0602 Shortness of breath: Secondary | ICD-10-CM

## 2011-10-03 DIAGNOSIS — I2510 Atherosclerotic heart disease of native coronary artery without angina pectoris: Secondary | ICD-10-CM

## 2011-10-03 DIAGNOSIS — E785 Hyperlipidemia, unspecified: Secondary | ICD-10-CM

## 2011-10-03 LAB — BASIC METABOLIC PANEL
BUN: 15 mg/dL (ref 6–23)
Calcium: 8.9 mg/dL (ref 8.4–10.5)
Creatinine, Ser: 1.2 mg/dL (ref 0.4–1.2)

## 2011-10-03 LAB — BRAIN NATRIURETIC PEPTIDE: Pro B Natriuretic peptide (BNP): 97 pg/mL (ref 0.0–100.0)

## 2011-10-13 ENCOUNTER — Ambulatory Visit (HOSPITAL_BASED_OUTPATIENT_CLINIC_OR_DEPARTMENT_OTHER): Payer: PRIVATE HEALTH INSURANCE | Admitting: Physician Assistant

## 2011-10-13 ENCOUNTER — Telehealth: Payer: Self-pay | Admitting: Oncology

## 2011-10-13 ENCOUNTER — Other Ambulatory Visit: Payer: PRIVATE HEALTH INSURANCE | Admitting: Lab

## 2011-10-13 ENCOUNTER — Encounter: Payer: Self-pay | Admitting: Physician Assistant

## 2011-10-13 VITALS — BP 124/62 | HR 62 | Temp 97.1°F | Ht 60.0 in | Wt 141.5 lb

## 2011-10-13 DIAGNOSIS — D649 Anemia, unspecified: Secondary | ICD-10-CM

## 2011-10-13 DIAGNOSIS — J111 Influenza due to unidentified influenza virus with other respiratory manifestations: Secondary | ICD-10-CM | POA: Insufficient documentation

## 2011-10-13 LAB — CBC WITH DIFFERENTIAL/PLATELET
BASO%: 0.3 % (ref 0.0–2.0)
EOS%: 1.8 % (ref 0.0–7.0)
LYMPH%: 33.6 % (ref 14.0–49.7)
MCH: 31.6 pg (ref 25.1–34.0)
MCHC: 34 g/dL (ref 31.5–36.0)
MONO#: 0.8 10*3/uL (ref 0.1–0.9)
NEUT%: 53.6 % (ref 38.4–76.8)
RBC: 3.42 10*6/uL — ABNORMAL LOW (ref 3.70–5.45)
WBC: 7.9 10*3/uL (ref 3.9–10.3)
lymph#: 2.6 10*3/uL (ref 0.9–3.3)

## 2011-10-13 LAB — COMPREHENSIVE METABOLIC PANEL
ALT: 11 U/L (ref 0–35)
CO2: 23 mEq/L (ref 19–32)
Calcium: 9.8 mg/dL (ref 8.4–10.5)
Chloride: 100 mEq/L (ref 96–112)
Sodium: 136 mEq/L (ref 135–145)
Total Bilirubin: 0.3 mg/dL (ref 0.3–1.2)
Total Protein: 6.7 g/dL (ref 6.0–8.3)

## 2011-10-13 LAB — IRON AND TIBC: %SAT: 19 % — ABNORMAL LOW (ref 20–55)

## 2011-10-13 LAB — FERRITIN: Ferritin: 397 ng/mL — ABNORMAL HIGH (ref 10–291)

## 2011-10-13 LAB — LACTATE DEHYDROGENASE: LDH: 115 U/L (ref 94–250)

## 2011-10-13 NOTE — Progress Notes (Signed)
[facility] OFFICE PROGRESS NOTE  [name] Inch, MD, MD  CC: [name] [name]. [name] [name], M.D.  [name] [name], M.D.  [name] Feinstein, MD   HISTORY:  I saw [name] [name] today for followup of her mild anemia in association with iron deficiency, dating back approximately 3 years ago when we first saw the patient.  Her clinical condition seems to be stable, if not improved since that time.  The patient is here today with her son, [name] [name].  It will be recalled that she received 2000 mg and INFeD on March 21, 2008.  Her ferritin prior to that infusion was 14.  Hemoglobin/hematocrit have been maintained.  The patient's main problem appears to be seizure episodes which involve loss of consciousness, nausea, vomiting, and diarrhea.  The patient last had an episode 9 months ago without recurrence.  Takes her several weeks to fully recover from these episodes.  The patient denies any pagophagia.  She has had a GI workup in the past.  At that time we evaluated the patient's stools which apparently were negative for occult blood.  She has seen Dr. Sharrell Ku in the past.  The patient tells me that she has seen a Dr. Carole Binning who comes from [facility] to [place].  She is generally active, however she has had a recent episode of flu in early January from which she is fully recovered. She has been seen by Dr. Shirlee Latch, cardiology, for shortness of breath related to CHF, which is now improved since adjustment in her meds.   Iron studies have been normal over the past couple years.Lasty Fe was 56, with TIBC 270, percent sat 21 and Ferritin 552. Her LDH was 138. Her H/H in 7/2 was 11.2 and 32.8. Today, her H/H is lower at 10.8 and 31.9, MCV 93. Anemia panel pending.  MEDICAL HISTORY: Past Medical History  Diagnosis Date  . SOB (shortness of breath)   . Fatigue   . Anxiety   . Seizure   . Dizziness   . Hot flashes   . Ischemic heart disease   . Hypertension   . Hyperlipidemia   . Coronary artery disease   . Anemia     . Syncope and collapse     RECURRENT, RELATED TO HYPONATREMIA, NARCOTICS, AND BRADYCARDIA  . Tobacco abuse     PAST  . Memory disorder   . Pneumonia   . Flu syndrome january 2013    SURGICAL HISTORY:  Past Surgical History  Procedure Date  . Cardiac catheterization 2004    MEDICATIONS: Current Outpatient Prescriptions  Medication Sig Dispense Refill  . budesonide-formoterol (SYMBICORT) 160-4.5 MCG/ACT inhaler Inhale 2 puffs into the lungs 2 (two) times daily.        . clopidogrel (PLAVIX) 75 MG tablet Take 1 tablet (75 mg total) by mouth daily.  30 tablet  4  . esomeprazole (NEXIUM) 40 MG capsule Take 40 mg by mouth daily before breakfast.        . furosemide (LASIX) 20 MG tablet Take 1 tablet (20 mg total) by mouth daily.  30 tablet  6  . isosorbide mononitrate (IMDUR) 30 MG 24 hr tablet Take 1/2 tablet daily  15 tablet  6  . LevETIRAcetam (KEPPRA PO) Take by mouth 2 (two) times daily.        . multivitamin (THERAGRAN) per tablet Take 1 tablet by mouth daily.      . pravastatin (PRAVACHOL) 80 MG tablet Take 1 tablet (80 mg total) by mouth every evening.  30 tablet  6  . pregabalin (LYRICA) 75 MG capsule Take 75 mg by mouth at bedtime.       . sertraline (ZOLOFT) 100 MG tablet Take by mouth daily.        . valsartan (DIOVAN) 40 MG tablet Take 1 tablet (40 mg total) by mouth daily.  30 tablet  6    ALLERGIES:  is allergic to codeine.  REVIEW OF SYSTEMS:  The rest of the 14-point review of system was negative.   Filed Vitals:   10/13/11 1141  BP: 124/62  Pulse: 62  Temp: 97.1 F (36.2 C)   Wt Readings from Last 3 Encounters:  10/13/11 141 lb 8 oz (64.184 kg)  09/19/11 136 lb (61.689 kg)  05/26/11 142 lb (64.411 kg)   ECOG Performance status:   PHYSICAL EXAMINATION:  General:  well-nourished in no acute distress.  Eyes:  no scleral icterus.  ENT:  There were no oropharyngeal lesions.  Neck was without thyromegaly.  Lymphatics:  Negative cervical, supraclavicular or  axillary adenopathy.  Respiratory: lungs were clear bilaterally without wheezing or crackles.  Cardiovascular:  Regular rate and rhythm, S1/S2, without murmur, rub or gallop.  There was no pedal edema.  GI:  abdomen was soft, flat, nontender, nondistended, without organomegaly.  Muscoloskeletal:  no spinal tenderness of palpation of vertebral spine.  Skin exam was without echymosis, petichae.  Neuro exam was nonfocal.  Patient was able to get on and off exam table without assistance.  Gait was normal.  Patient was alerted and oriented.  Attention was good.   Language was appropriate.  Mood was normal without depression.  Speech was not pressured.  Thought content was not tangential.         LABORATORY/RADIOLOGY DATA:   Lab 10/13/11 1124  WBC 7.9  HGB 10.8*  HCT 31.9*  PLT 270  MCV 93.0  MCH 31.6  MCHC 34.0  RDW 12.7  LYMPHSABS 2.6  MONOABS 0.8  EOSABS 0.1  BASOSABS 0.0  BANDABS --    CMP   No results found for this basename: NA:5,K:5,CL:5,CO2:5,GLUCOSE:5,BUN:5,CREATININE:5,GFRCGP,:5,CALCIUM:5,MG:5,AST:5,ALT:5,ALKPHOS:5,BILITOT:5 in the last 168 hours      Component Value Date/Time   BILITOT 0.3 09/13/2011 0836   BILIDIR 0.0 09/13/2011 0836     Radiology Studies:  No results found.     ASSESSMENT AND PLAN:   [name] is showing a slight decrease on her H/H for which closer monitoring is recommended. She has a history of CHF which may play a role in these values. Her Cr is 1.2. Will check her Iron studies and CBC in 3 months, and in 6 months at the time of her visit with Dr. Arline Asp. If her CBC and Iron studies are abnormal, will proceed with further studies.   Will continue to monitor her.  She knows to call in the interim if she has any questions or concerns.

## 2011-10-13 NOTE — Telephone Encounter (Signed)
appt made and printed for may and august for lab and md  aom

## 2011-11-16 ENCOUNTER — Encounter: Payer: Self-pay | Admitting: Neurology

## 2011-11-16 ENCOUNTER — Encounter: Payer: Self-pay | Admitting: Cardiology

## 2011-11-16 ENCOUNTER — Other Ambulatory Visit: Payer: PRIVATE HEALTH INSURANCE

## 2011-11-16 ENCOUNTER — Ambulatory Visit (INDEPENDENT_AMBULATORY_CARE_PROVIDER_SITE_OTHER): Payer: PRIVATE HEALTH INSURANCE | Admitting: Cardiology

## 2011-11-16 VITALS — BP 150/70 | HR 72 | Resp 18 | Ht 62.0 in | Wt 142.4 lb

## 2011-11-16 DIAGNOSIS — R42 Dizziness and giddiness: Secondary | ICD-10-CM

## 2011-11-16 DIAGNOSIS — R569 Unspecified convulsions: Secondary | ICD-10-CM

## 2011-11-16 DIAGNOSIS — R251 Tremor, unspecified: Secondary | ICD-10-CM

## 2011-11-16 DIAGNOSIS — I10 Essential (primary) hypertension: Secondary | ICD-10-CM

## 2011-11-16 DIAGNOSIS — E785 Hyperlipidemia, unspecified: Secondary | ICD-10-CM

## 2011-11-16 DIAGNOSIS — R259 Unspecified abnormal involuntary movements: Secondary | ICD-10-CM

## 2011-11-16 DIAGNOSIS — I2510 Atherosclerotic heart disease of native coronary artery without angina pectoris: Secondary | ICD-10-CM

## 2011-11-16 DIAGNOSIS — I5032 Chronic diastolic (congestive) heart failure: Secondary | ICD-10-CM

## 2011-11-16 DIAGNOSIS — I509 Heart failure, unspecified: Secondary | ICD-10-CM

## 2011-11-16 LAB — LIPID PANEL
Total CHOL/HDL Ratio: 3
VLDL: 32.4 mg/dL (ref 0.0–40.0)

## 2011-11-16 LAB — HEPATIC FUNCTION PANEL
AST: 17 U/L (ref 0–37)
Albumin: 4.3 g/dL (ref 3.5–5.2)
Alkaline Phosphatase: 49 U/L (ref 39–117)

## 2011-11-16 NOTE — Patient Instructions (Addendum)
Your physician recommends that you have  a FASTING lipid profile /liver profile today.  Dr Shirlee Latch has referred you to Dr Modesto Charon at [facility].  Your physician wants you to follow-up in: 6 months with Dr Shirlee Latch. (September 2013). You will receive a reminder letter in the mail two months in advance. If you don't receive a letter, please call our office to schedule the follow-up appointment.

## 2011-11-17 DIAGNOSIS — I5032 Chronic diastolic (congestive) heart failure: Secondary | ICD-10-CM | POA: Insufficient documentation

## 2011-11-17 DIAGNOSIS — R251 Tremor, unspecified: Secondary | ICD-10-CM | POA: Insufficient documentation

## 2011-11-17 NOTE — Assessment & Plan Note (Signed)
History of seizures.  She is on Keppra.  She needs a new neurologist.  I will refer her to Dr. Modesto Charon.

## 2011-11-17 NOTE — Assessment & Plan Note (Signed)
SBP < 140 when she checks at home, mildly elevated here today.  Given symptomatic lightheadedness with higher doses of BP meds, will tolerate borderline elevated BP.

## 2011-11-17 NOTE — Assessment & Plan Note (Signed)
Appears euvolemic today.  Breathing better on Lasix.  Labs ok.  Continue.

## 2011-11-17 NOTE — Assessment & Plan Note (Signed)
Resolved with decreased valsartan.

## 2011-11-17 NOTE — Progress Notes (Signed)
PCP: Dr. Cyndia Bent  78 yo with history of CAD and seizure disorder presents for cardiology followup.  When I last saw her, [name] [name] was having orthostatic symptoms so I decreased her valsartan.  Since then, the orthostatic symptoms have resolved.  [name] [name] started Lasix also at last appointment and tells me today that [name] [name] is breathing better.  No dyspnea when walking short distances (not particularly active).  BP is mildly elevated today in the office but [name] [name] tells me that SBP < 140 when [name] [name] checks at home.   [name] [name] has had spells of "shakiness" with no trigger.  [name] [name] is not lightheaded, [name] [name] just gets very tremulous.  [name] [name] has had her blood sugar and her BP checked during these spells and both have been normal.  [name] [name] does have a history of seizures and is taking Keppra.  Her neurologist has moved and [name] [name] needs a new neurologist.   Labs (9/12): K 5.5, creatinine 1.3, TSH mildly elevated  Labs (10/12); BNP 277 Labs (1/13): LDL 90, HDL 61, K 5, creatinine 1.2 Labs (2/13): K 4.8, creatinine 1.13, BNP 97, HCT 31.9  ECG: NSR at 56, normal  PMH: 1.  Seizure disorder 2.  CAD: Left heart cath in 2004 showed 70% ostial LAD stenosis and 50-60% proximal ramus disease.  It was decided to manage her medically.  Myoview (5/11) showed EF 74% and was a normal study.   3. Chronic diastolic CHF: Echo (6/08) showed EF 55-60% with grade I diastolic dysfunction, mild LAE.  4. HTN 5. Hyperlipidemia 6. GERD 7. Chronic Fe deficiency anemia.  SH: Lives in [place], 1 son lives with her (has 2 sons).  Prior smoker.   FH: Parents with CAD  ROS: All systems reviewed and negative except as per HPI.   Current Outpatient Prescriptions  Medication Sig Dispense Refill  . budesonide-formoterol (SYMBICORT) 160-4.5 MCG/ACT inhaler Inhale 2 puffs into the lungs 2 (two) times daily.        . clopidogrel (PLAVIX) 75 MG tablet Take 1 tablet (75 mg total) by mouth daily.  30 tablet  4  . esomeprazole (NEXIUM) 40 MG capsule Take 40 mg by  mouth daily before breakfast.        . furosemide (LASIX) 20 MG tablet Take 1 tablet (20 mg total) by mouth daily.  30 tablet  6  . isosorbide mononitrate (IMDUR) 30 MG 24 hr tablet Take 1/2 tablet daily  15 tablet  6  . LevETIRAcetam (KEPPRA PO) Take by mouth 2 (two) times daily.        . multivitamin (THERAGRAN) per tablet Take 1 tablet by mouth daily.      . pravastatin (PRAVACHOL) 80 MG tablet Take 1 tablet (80 mg total) by mouth every evening.  30 tablet  6  . pregabalin (LYRICA) 75 MG capsule Take 75 mg by mouth at bedtime.       . sertraline (ZOLOFT) 100 MG tablet Take by mouth daily.        . valsartan (DIOVAN) 40 MG tablet Take 1 tablet (40 mg total) by mouth daily.  30 tablet  6    BP 150/70  Pulse 72  Resp 18  Ht 5\' 2"  (1.575 m)  Wt 142 lb 6.4 oz (64.592 kg)  BMI 26.05 kg/m2 General: NAD, elderly Neck: No JVD, no thyromegaly or thyroid nodule.  Lungs: Clear to auscultation bilaterally with normal respiratory effort. CV: Nondisplaced PMI.  Heart regular S1/S2, no S3/S4, 1/6 early SEM.  No peripheral edema.  No carotid  bruit.  Normal pedal pulses.  Abdomen: Soft, nontender, no hepatosplenomegaly, no distention.  Neurologic: Alert and oriented x 3.  Psych: Normal affect. Extremities: No clubbing or cyanosis.

## 2011-11-17 NOTE — Assessment & Plan Note (Signed)
Stable with no ischemic-type chest pain.  She will continue Plavix, Imdur, ARB, and statin.  She has not been on aspirin due to GI upset.  

## 2011-11-17 NOTE — Assessment & Plan Note (Signed)
Goal LDL < 70.  Check lipids/LFTs on increased pravastatin.

## 2012-01-02 ENCOUNTER — Ambulatory Visit: Payer: PRIVATE HEALTH INSURANCE | Admitting: Neurology

## 2012-01-09 ENCOUNTER — Other Ambulatory Visit: Payer: Self-pay | Admitting: Medical Oncology

## 2012-01-09 ENCOUNTER — Other Ambulatory Visit (HOSPITAL_BASED_OUTPATIENT_CLINIC_OR_DEPARTMENT_OTHER): Payer: Medicare HMO | Admitting: Lab

## 2012-01-09 DIAGNOSIS — D649 Anemia, unspecified: Secondary | ICD-10-CM

## 2012-01-09 LAB — CBC WITH DIFFERENTIAL/PLATELET
Eosinophils Absolute: 0.2 10*3/uL (ref 0.0–0.5)
LYMPH%: 35.4 % (ref 14.0–49.7)
MONO#: 0.8 10*3/uL (ref 0.1–0.9)
NEUT#: 4.6 10*3/uL (ref 1.5–6.5)
Platelets: 326 10*3/uL (ref 145–400)
RBC: 3.68 10*6/uL — ABNORMAL LOW (ref 3.70–5.45)
RDW: 12.5 % (ref 11.2–14.5)
WBC: 8.5 10*3/uL (ref 3.9–10.3)
lymph#: 3 10*3/uL (ref 0.9–3.3)
nRBC: 0 % (ref 0–0)

## 2012-01-09 LAB — IRON AND TIBC
%SAT: 18 % — ABNORMAL LOW (ref 20–55)
Iron: 52 ug/dL (ref 42–145)
TIBC: 291 ug/dL (ref 250–470)
UIBC: 239 ug/dL (ref 125–400)

## 2012-01-11 ENCOUNTER — Other Ambulatory Visit (INDEPENDENT_AMBULATORY_CARE_PROVIDER_SITE_OTHER): Payer: Medicare HMO

## 2012-01-11 ENCOUNTER — Encounter: Payer: Self-pay | Admitting: Neurology

## 2012-01-11 ENCOUNTER — Other Ambulatory Visit: Payer: Self-pay | Admitting: Neurology

## 2012-01-11 ENCOUNTER — Ambulatory Visit (INDEPENDENT_AMBULATORY_CARE_PROVIDER_SITE_OTHER): Payer: Medicare HMO | Admitting: Neurology

## 2012-01-11 ENCOUNTER — Other Ambulatory Visit: Payer: Self-pay | Admitting: Cardiology

## 2012-01-11 VITALS — BP 100/58 | HR 80 | Wt 142.0 lb

## 2012-01-11 DIAGNOSIS — R569 Unspecified convulsions: Secondary | ICD-10-CM

## 2012-01-11 DIAGNOSIS — R51 Headache: Secondary | ICD-10-CM

## 2012-01-11 DIAGNOSIS — Z79899 Other long term (current) drug therapy: Secondary | ICD-10-CM

## 2012-01-11 LAB — COMPREHENSIVE METABOLIC PANEL WITH GFR
ALT: 15 U/L (ref 0–35)
AST: 23 U/L (ref 0–37)
Albumin: 4.5 g/dL (ref 3.5–5.2)
Alkaline Phosphatase: 61 U/L (ref 39–117)
BUN: 17 mg/dL (ref 6–23)
CO2: 26 meq/L (ref 19–32)
Calcium: 8.8 mg/dL (ref 8.4–10.5)
Chloride: 99 meq/L (ref 96–112)
Creatinine, Ser: 1.1 mg/dL (ref 0.4–1.2)
GFR: 48.38 mL/min — ABNORMAL LOW (ref >60.00)
Glucose, Bld: 82 mg/dL (ref 70–99)
Potassium: 4.1 meq/L (ref 3.5–5.1)
Sodium: 134 meq/L — ABNORMAL LOW (ref 135–145)
Total Bilirubin: 0.3 mg/dL (ref 0.3–1.2)
Total Protein: 7.5 g/dL (ref 6.0–8.3)

## 2012-01-11 LAB — CBC WITH DIFFERENTIAL/PLATELET
Basophils Absolute: 0 K/uL (ref 0.0–0.1)
Basophils Relative: 0.2 % (ref 0.0–3.0)
Eosinophils Absolute: 0.2 K/uL (ref 0.0–0.7)
Eosinophils Relative: 1.9 % (ref 0.0–5.0)
HCT: 33.3 % — ABNORMAL LOW (ref 36.0–46.0)
Hemoglobin: 11.3 g/dL — ABNORMAL LOW (ref 12.0–15.0)
Lymphocytes Relative: 38.4 % (ref 12.0–46.0)
Lymphs Abs: 3.3 K/uL (ref 0.7–4.0)
MCHC: 33.8 g/dL (ref 30.0–36.0)
MCV: 93.2 fl (ref 78.0–100.0)
Monocytes Absolute: 0.8 K/uL (ref 0.1–1.0)
Monocytes Relative: 9.6 % (ref 3.0–12.0)
Neutro Abs: 4.3 K/uL (ref 1.4–7.7)
Neutrophils Relative %: 49.9 % (ref 43.0–77.0)
Platelets: 318 K/uL (ref 150.0–400.0)
RBC: 3.57 Mil/uL — ABNORMAL LOW (ref 3.87–5.11)
RDW: 12.9 % (ref 11.5–14.6)
WBC: 8.6 K/uL (ref 4.5–10.5)

## 2012-01-11 LAB — VITAMIN B12: Vitamin B-12: 406 pg/mL (ref 211–911)

## 2012-01-11 LAB — HEMOGLOBIN A1C: Hgb A1c MFr Bld: 6.2 % (ref 4.6–6.5)

## 2012-01-11 LAB — TSH: TSH: 3.7 u[IU]/mL (ref 0.35–5.50)

## 2012-01-11 NOTE — Progress Notes (Signed)
Dear Dr. Shirlee Latch,  Thank you for having me see [name] [name] in consultation today at [facility] for her problem with epilepsy as well as her head pain.  As you may recall, she is a 78 y.o. year old female with a history of "memory disorder", hypertension and hyperlipidemia who has had a six-year history of seizures. The details of these are a little hazy but they sound like nonconvulsive events where she typically falls. They're without aura. She typically confused for at least a day afterwards. She loses control of her bowels. Details of the workup are unknown. She was placed on Keppra about 2 years ago. Her last seizure was a year ago and her Keppra was increased from 500 once a day to 500 twice a day. She's not had an event since that point.  She also has begun to complain of right-sided headaches. They're occurring several times per week. She treats them with "biofreeze" and Tylenol. She typically has to go lie down. They're occurring several times per week. There is no pain with chewing. She's had no recent change in her vision. There is photophobia accompanying these headaches.  She has a history of severe migraine headaches when she was when she was 9 and 78 years old. after a total hysterectomy they stopped.  Interestingly she also complains of pulsatile tinnitus unrelated to the headaches. She cannot endorse precipitating maneuver.  Past Medical History  Diagnosis Date  . SOB (shortness of breath)   . Fatigue   . Anxiety   . Seizure   . Dizziness   . Hot flashes   . Ischemic heart disease   . Hypertension   . Hyperlipidemia   . Coronary artery disease   . Anemia   . Syncope and collapse     RECURRENT, RELATED TO HYPONATREMIA, NARCOTICS, AND BRADYCARDIA  . Tobacco abuse     PAST  . Memory disorder   . Pneumonia   . Flu syndrome january 2013  - no history of seizures as a child, no strokes, no meningitis. - she did hit her head as a child and was knocked  unconscious  Past Surgical History  Procedure Date  . Cardiac catheterization 2004    History   Social History  . Marital Status: Widowed    Spouse Name: N/A    Number of Children: N/A  . Years of Education: N/A   Social History Main Topics  . Smoking status: Former Smoker    Quit date: 08/23/1999  . Smokeless tobacco: Never Used  . Alcohol Use: No  . Drug Use: No  . Sexually Active: None   Other Topics Concern  . None   Social History Narrative  . None    Family History  Problem Relation Age of Onset  . Heart attack Father   . Heart failure Mother   - no history of seizures in the family.  Current Outpatient Prescriptions on File Prior to Visit  Medication Sig Dispense Refill  . budesonide-formoterol (SYMBICORT) 160-4.5 MCG/ACT inhaler Inhale 2 puffs into the lungs 2 (two) times daily.        Marland Kitchen esomeprazole (NEXIUM) 40 MG capsule Take 40 mg by mouth daily before breakfast.        . furosemide (LASIX) 20 MG tablet Take 1 tablet (20 mg total) by mouth daily.  30 tablet  6  . LevETIRAcetam (KEPPRA PO) Take by mouth 2 (two) times daily.        . multivitamin (THERAGRAN) per  tablet Take 1 tablet by mouth daily.      . pravastatin (PRAVACHOL) 80 MG tablet Take 1 tablet (80 mg total) by mouth every evening.  30 tablet  6  . pregabalin (LYRICA) 75 MG capsule Take 75 mg by mouth at bedtime.       Marland Kitchen tiotropium (SPIRIVA) 18 MCG inhalation capsule Place 18 mcg into inhaler and inhale daily.      . valsartan (DIOVAN) 40 MG tablet Take 1 tablet (40 mg total) by mouth daily.  30 tablet  6  . DISCONTD: clopidogrel (PLAVIX) 75 MG tablet Take 1 tablet (75 mg total) by mouth daily.  30 tablet  4  . DISCONTD: isosorbide mononitrate (IMDUR) 30 MG 24 hr tablet Take 1/2 tablet daily  15 tablet  6  . sertraline (ZOLOFT) 100 MG tablet Take by mouth daily.          Allergies  Allergen Reactions  . Codeine       ROS:  13 systems were reviewed and are notable for problems with memory  and inability to concentrate, shortness of breath, hearing loss.  All other review of systems are unremarkable.   Examination:  Filed Vitals:   01/11/12 1301  BP: 100/58  Pulse: 80  Weight: 142 lb (64.411 kg)     In general, well appearing older women  Cardiovascular: The patient has a regular rate and rhythm and ?left sided bruit.  Fundoscopy:  Disks are flat. Vessel caliber within normal limits.  Mental status:   The patient is oriented to person, place and time. Recent and remote memory are intact. Attention span and concentration are normal. Language including repetition, naming, following commands are intact. Fund of knowledge of current and historical events, as well as vocabulary are normal.  Cranial Nerves: Pupils are equally round and reactive to light. Visual fields full to confrontation. Extraocular movements are intact without nystagmus. Facial sensation and muscles of mastication are intact. Muscles of facial expression are symmetric. Hearing intact to bilateral finger rub. Tongue protrusion, uvula, palate midline.  Shoulder shrug intact  Motor:  The patient has normal bulk and tone, no pronator drift.  There are no adventitious movements.  5/5 muscle strength bilaterally.  Reflexes:  Quiet reflexes, absent in ankles.  Toes down  Coordination:  Normal finger to nose.  No dysdiadokinesia.  Sensation is decreased to temperature and vibration in her feet in a length dep manner.  Position intact.  Gait and Station are mildly ataxic.   Romberg is negative   Impression/Recs: 1.  seizures-these are most likely seizures of focal onset. It is possible that they're not seizures at all although response to Keppra does support that they are. In any case they are currently not occurring. I would advocate keeping her on the Keppra at 500 mg twice a day. Obviously this can be increased if she has breakthrough event. I am going to get a routine EEG to try better classify her  epilepsy.  2.pulsatile tinnitus-I'm going to get a CTA of her head and neck to look for arterial venous malformation. This is important as it may explain the cause of her headaches.  3.headaches-I'm going to get an MRI of her brain. However I believe her headaches are likely migraines. I think a diagnosis of temporal arteritis is much less likely given the lack of jaw claudication. I have sent off an ESR and CRP.  4.peripheral neuropathy-she has a peripheral neuropathy on examination. This is likely causing her mild gait unsteadiness.  I'm going to get peripheral neuropathy labs but defer I nerve conduction study.   We will see the patient back in 3 months.  Thank you for having [place] see [name] [name] in consultation.  Feel free to contact me with any questions.  Lupita Raider Modesto Charon, MD [facility], [place] [address] [place], [place] [address] Phone: [phone] Fax: [phone].

## 2012-01-11 NOTE — Patient Instructions (Addendum)
Go to the basement to have your labs drawn today.  Your EEG is scheduled at [facility] on Wednesday, May 29th at 9:30 am.  Please arrive at first floor admitting fifteen minutes prior to your appointment.   [phone].   Your MRI is scheduled at [facility] located at [address] in [place] on Friday, May 24th at 3:00 pm . Please arrive 15 minutes prior to your appointment time.   [phone].   Your CTA head and neck is scheduled on Tuesday, May 28th at 3:00 pm.at [address] [place]. No solid food 4 hours prior to your test time. You may have liquids and take your medications. Please arrive 30 minutes prior to your appointment.  [phone].

## 2012-01-13 ENCOUNTER — Other Ambulatory Visit: Payer: Medicare HMO

## 2012-01-13 LAB — SPEP & IFE WITH QIG
Albumin ELP: 62.2 % (ref 55.8–66.1)
Alpha-1-Globulin: 3.8 % (ref 2.9–4.9)
Alpha-2-Globulin: 12.6 % — ABNORMAL HIGH (ref 7.1–11.8)
Total Protein, Serum Electrophoresis: 7 g/dL (ref 6.0–8.3)

## 2012-01-17 ENCOUNTER — Other Ambulatory Visit: Payer: Medicare HMO

## 2012-01-17 LAB — METHYLMALONIC ACID, SERUM: Methylmalonic Acid, Quant: 0.33 umol/L (ref <0.40)

## 2012-01-18 ENCOUNTER — Ambulatory Visit (HOSPITAL_COMMUNITY): Payer: Medicare HMO

## 2012-01-20 ENCOUNTER — Telehealth: Payer: Self-pay | Admitting: Neurology

## 2012-01-20 NOTE — Telephone Encounter (Signed)
Message copied by Benay Spice on Fri Jan 20, 2012  9:39 AM ------      Message from: Milas Gain      Created: Fri Jan 20, 2012  8:55 AM       Let ms. Brassfield know her labs were ok.

## 2012-01-20 NOTE — Telephone Encounter (Signed)
Called and spoke with the patient's son, Rosalia Hammers. Informed all lab work was normal. No additional concerns voiced by him at his time.

## 2012-03-12 ENCOUNTER — Inpatient Hospital Stay (HOSPITAL_COMMUNITY)
Admission: EM | Admit: 2012-03-12 | Discharge: 2012-03-15 | DRG: 101 | Disposition: A | Discharge status: Home / Self Care | Payer: Medicare HMO | Attending: Internal Medicine | Admitting: Internal Medicine

## 2012-03-12 ENCOUNTER — Encounter (HOSPITAL_COMMUNITY): Payer: Self-pay | Admitting: *Deleted

## 2012-03-12 ENCOUNTER — Emergency Department (HOSPITAL_COMMUNITY): Payer: Medicare HMO

## 2012-03-12 DIAGNOSIS — Z720 Tobacco use: Secondary | ICD-10-CM

## 2012-03-12 DIAGNOSIS — R112 Nausea with vomiting, unspecified: Secondary | ICD-10-CM | POA: Diagnosis present

## 2012-03-12 DIAGNOSIS — F419 Anxiety disorder, unspecified: Secondary | ICD-10-CM

## 2012-03-12 DIAGNOSIS — J189 Pneumonia, unspecified organism: Secondary | ICD-10-CM

## 2012-03-12 DIAGNOSIS — I951 Orthostatic hypotension: Secondary | ICD-10-CM | POA: Diagnosis present

## 2012-03-12 DIAGNOSIS — E785 Hyperlipidemia, unspecified: Secondary | ICD-10-CM

## 2012-03-12 DIAGNOSIS — R5383 Other fatigue: Secondary | ICD-10-CM

## 2012-03-12 DIAGNOSIS — F3289 Other specified depressive episodes: Secondary | ICD-10-CM | POA: Diagnosis present

## 2012-03-12 DIAGNOSIS — F329 Major depressive disorder, single episode, unspecified: Secondary | ICD-10-CM | POA: Diagnosis present

## 2012-03-12 DIAGNOSIS — R413 Other amnesia: Secondary | ICD-10-CM

## 2012-03-12 DIAGNOSIS — R232 Flushing: Secondary | ICD-10-CM

## 2012-03-12 DIAGNOSIS — I10 Essential (primary) hypertension: Secondary | ICD-10-CM | POA: Diagnosis present

## 2012-03-12 DIAGNOSIS — R55 Syncope and collapse: Secondary | ICD-10-CM | POA: Diagnosis present

## 2012-03-12 DIAGNOSIS — K219 Gastro-esophageal reflux disease without esophagitis: Secondary | ICD-10-CM

## 2012-03-12 DIAGNOSIS — I2510 Atherosclerotic heart disease of native coronary artery without angina pectoris: Secondary | ICD-10-CM

## 2012-03-12 DIAGNOSIS — I5032 Chronic diastolic (congestive) heart failure: Secondary | ICD-10-CM

## 2012-03-12 DIAGNOSIS — J111 Influenza due to unidentified influenza virus with other respiratory manifestations: Secondary | ICD-10-CM

## 2012-03-12 DIAGNOSIS — R42 Dizziness and giddiness: Secondary | ICD-10-CM

## 2012-03-12 DIAGNOSIS — R569 Unspecified convulsions: Secondary | ICD-10-CM

## 2012-03-12 DIAGNOSIS — G40909 Epilepsy, unspecified, not intractable, without status epilepticus: Principal | ICD-10-CM | POA: Diagnosis present

## 2012-03-12 DIAGNOSIS — I259 Chronic ischemic heart disease, unspecified: Secondary | ICD-10-CM

## 2012-03-12 DIAGNOSIS — R251 Tremor, unspecified: Secondary | ICD-10-CM

## 2012-03-12 DIAGNOSIS — D649 Anemia, unspecified: Secondary | ICD-10-CM | POA: Diagnosis present

## 2012-03-12 DIAGNOSIS — E871 Hypo-osmolality and hyponatremia: Secondary | ICD-10-CM

## 2012-03-12 DIAGNOSIS — R0602 Shortness of breath: Secondary | ICD-10-CM

## 2012-03-12 LAB — CBC WITH DIFFERENTIAL/PLATELET
Basophils Absolute: 0 10*3/uL (ref 0.0–0.1)
Basophils Relative: 0 % (ref 0–1)
Eosinophils Absolute: 0.2 10*3/uL (ref 0.0–0.7)
HCT: 32.6 % — ABNORMAL LOW (ref 36.0–46.0)
Hemoglobin: 11.3 g/dL — ABNORMAL LOW (ref 12.0–15.0)
MCH: 30.9 pg (ref 26.0–34.0)
MCHC: 34.7 g/dL (ref 30.0–36.0)
Monocytes Absolute: 0.8 10*3/uL (ref 0.1–1.0)
Monocytes Relative: 10 % (ref 3–12)
RDW: 12.2 % (ref 11.5–15.5)

## 2012-03-12 LAB — CARDIAC PANEL(CRET KIN+CKTOT+MB+TROPI)
CK, MB: 1.6 ng/mL (ref 0.3–4.0)
Total CK: 81 U/L (ref 7–177)
Troponin I: <0.3 ng/mL (ref <0.30)

## 2012-03-12 LAB — TYPE AND SCREEN

## 2012-03-12 LAB — BASIC METABOLIC PANEL
BUN: 15 mg/dL (ref 6–23)
Calcium: 9.8 mg/dL (ref 8.4–10.5)
Creatinine, Ser: 1.15 mg/dL — ABNORMAL HIGH (ref 0.50–1.10)
GFR calc Af Amer: 50 mL/min — ABNORMAL LOW (ref >90)
GFR calc non Af Amer: 43 mL/min — ABNORMAL LOW (ref >90)

## 2012-03-12 LAB — ABO/RH: ABO/RH(D): O POS

## 2012-03-12 LAB — PROTIME-INR: INR: 0.95 (ref 0.00–1.49)

## 2012-03-12 MED ORDER — FLUTICASONE-SALMETEROL 250-50 MCG/DOSE IN AEPB
1.0000 | INHALATION_SPRAY | Freq: Two times a day (BID) | RESPIRATORY_TRACT | Status: DC
  Administered 2012-03-13 – 2012-03-15 (×5): 1 via RESPIRATORY_TRACT
  Filled 2012-03-12: qty 14

## 2012-03-12 MED ORDER — LEVETIRACETAM 500 MG PO TABS
500.0000 mg | ORAL_TABLET | Freq: Two times a day (BID) | ORAL | Status: DC
  Administered 2012-03-13 (×2): 500 mg via ORAL
  Filled 2012-03-12 (×3): qty 1

## 2012-03-12 MED ORDER — SODIUM CHLORIDE 0.9 % IJ SOLN
3.0000 mL | Freq: Two times a day (BID) | INTRAMUSCULAR | Status: DC
  Administered 2012-03-15: 3 mL via INTRAVENOUS

## 2012-03-12 MED ORDER — PREGABALIN 75 MG PO CAPS
75.0000 mg | ORAL_CAPSULE | Freq: Every day | ORAL | Status: DC
  Administered 2012-03-13: 75 mg via ORAL
  Filled 2012-03-12: qty 1

## 2012-03-12 MED ORDER — SODIUM CHLORIDE 0.9 % IV BOLUS (SEPSIS): 500.0000 mL | Freq: Once | INTRAVENOUS | Status: DC

## 2012-03-12 MED ORDER — SODIUM CHLORIDE 0.9 % IV SOLN
INTRAVENOUS | Status: AC
  Administered 2012-03-13 – 2012-03-14 (×2): via INTRAVENOUS

## 2012-03-12 MED ORDER — TIOTROPIUM BROMIDE MONOHYDRATE 18 MCG IN CAPS
18.0000 ug | ORAL_CAPSULE | Freq: Every day | RESPIRATORY_TRACT | Status: DC
  Administered 2012-03-13 – 2012-03-15 (×3): 18 ug via RESPIRATORY_TRACT
  Filled 2012-03-12: qty 5

## 2012-03-12 MED ORDER — FERROUS SULFATE 325 (65 FE) MG PO TABS
325.0000 mg | ORAL_TABLET | Freq: Two times a day (BID) | ORAL | Status: DC
  Administered 2012-03-13 – 2012-03-15 (×6): 325 mg via ORAL
  Filled 2012-03-12 (×7): qty 1

## 2012-03-12 MED ORDER — ACETAMINOPHEN 325 MG PO TABS
650.0000 mg | ORAL_TABLET | Freq: Four times a day (QID) | ORAL | Status: DC | PRN
  Administered 2012-03-14 – 2012-03-15 (×2): 650 mg via ORAL
  Filled 2012-03-12: qty 2
  Filled 2012-03-12 (×2): qty 1

## 2012-03-12 MED ORDER — FLUOXETINE HCL 20 MG PO CAPS
40.0000 mg | ORAL_CAPSULE | Freq: Every day | ORAL | Status: DC
  Filled 2012-03-12: qty 2

## 2012-03-12 MED ORDER — CLOPIDOGREL BISULFATE 75 MG PO TABS
75.0000 mg | ORAL_TABLET | Freq: Every day | ORAL | Status: DC
  Administered 2012-03-13 – 2012-03-15 (×3): 75 mg via ORAL
  Filled 2012-03-12 (×4): qty 1

## 2012-03-12 MED ORDER — IRBESARTAN 75 MG PO TABS
75.0000 mg | ORAL_TABLET | Freq: Every day | ORAL | Status: DC
  Administered 2012-03-13 – 2012-03-15 (×3): 75 mg via ORAL
  Filled 2012-03-12 (×3): qty 1

## 2012-03-12 MED ORDER — PANTOPRAZOLE SODIUM 40 MG PO TBEC
40.0000 mg | DELAYED_RELEASE_TABLET | Freq: Every day | ORAL | Status: DC
  Administered 2012-03-13 – 2012-03-15 (×3): 40 mg via ORAL
  Filled 2012-03-12 (×3): qty 1

## 2012-03-12 MED ORDER — SODIUM CHLORIDE 0.9 % IV SOLN
1000.0000 mL | INTRAVENOUS | Status: DC
  Administered 2012-03-12: 1000 mL via INTRAVENOUS

## 2012-03-12 MED ORDER — SIMVASTATIN 40 MG PO TABS
40.0000 mg | ORAL_TABLET | Freq: Every day | ORAL | Status: DC
  Administered 2012-03-13 – 2012-03-14 (×2): 40 mg via ORAL
  Filled 2012-03-12 (×3): qty 1

## 2012-03-12 MED ORDER — ISOSORBIDE MONONITRATE 15 MG HALF TABLET
15.0000 mg | ORAL_TABLET | Freq: Every day | ORAL | Status: DC
  Administered 2012-03-14 – 2012-03-15 (×2): 15 mg via ORAL
  Filled 2012-03-12 (×3): qty 1

## 2012-03-12 NOTE — H&P (Signed)
[name] [name] is an 78 y.o. female.    Pcp:  Larita Fife  Chief Complaint: syncope HPI: 78 yo female with hx of cad , syncope has c/o feeling generally weak and went to bathroom and passed out after getting u p from commode, and then ? Had seizure , and then had incontinence, bowel and bladder and n/v, (no blood).  Pt is not sure how long she had loc. No tonic clonic movements noted by son. Pt denies cp, palp, sob, focal neurological symptoms, of numbness, tingling weakness. Pt brought to ED.   In ED  CT brain negative for any acute process.  cpk mb, trop pending. EKG: nsr at 50, nl axis, early R progression. Pt presently alert and oriented x3.  Pt denies missing any keppra.    Past Medical History  Diagnosis Date  . SOB (shortness of breath)   . Fatigue   . Anxiety   . Seizure   . Dizziness   . Hot flashes   . Ischemic heart disease   . Hypertension   . Hyperlipidemia   . Coronary artery disease     [name]  . Anemia   . Syncope and collapse     RECURRENT, RELATED TO HYPONATREMIA, NARCOTICS, AND BRADYCARDIA  . Tobacco abuse     PAST  . Memory disorder   . Pneumonia   . Flu syndrome january 2013    Past Surgical History  Procedure Date  . Cardiac catheterization 2004  . Abdominal hysterectomy     early 1970's for fibroid    Family History  Problem Relation Age of Onset  . Heart attack Father   . Heart failure Mother    Social History:  reports that she quit smoking about 12 years ago. She has never used smokeless tobacco. She reports that she does not drink alcohol or use illicit drugs.  Allergies:  Allergies  Allergen Reactions  . Codeine      (Not in a hospital admission)  Results for orders placed during the hospital encounter of 03/12/12 (from the past 48 hour(s))  CBC WITH DIFFERENTIAL     Status: Abnormal   Collection Time   03/12/12  5:22 PM      Component Value Range Comment   WBC 8.3  4.0 - 10.5 K/uL    RBC 3.66 (*) 3.87 - 5.11 MIL/uL    Hemoglobin 11.3 (*) 12.0 - 15.0 g/dL    HCT 16.1 (*) 09.6 - 46.0 %    MCV 89.1  78.0 - 100.0 fL    MCH 30.9  26.0 - 34.0 pg    MCHC 34.7  30.0 - 36.0 g/dL    RDW 04.5  40.9 - 81.1 %    Platelets 335  150 - 400 K/uL    Neutrophils Relative 67  43 - 77 %    Neutro Abs 5.6  1.7 - 7.7 K/uL    Lymphocytes Relative 21  12 - 46 %    Lymphs Abs 1.7  0.7 - 4.0 K/uL    Monocytes Relative 10  3 - 12 %    Monocytes Absolute 0.8  0.1 - 1.0 K/uL    Eosinophils Relative 2  0 - 5 %    Eosinophils Absolute 0.2  0.0 - 0.7 K/uL    Basophils Relative 0  0 - 1 %    Basophils Absolute 0.0  0.0 - 0.1 K/uL   BASIC METABOLIC PANEL     Status: Abnormal   Collection  Time   03/12/12  5:22 PM      Component Value Range Comment   Sodium 126 (*) 135 - 145 mEq/L    Potassium 4.6  3.5 - 5.1 mEq/L    Chloride 91 (*) 96 - 112 mEq/L    CO2 23  19 - 32 mEq/L    Glucose, Bld 119 (*) 70 - 99 mg/dL    BUN 15  6 - 23 mg/dL    Creatinine, Ser 1.61 (*) 0.50 - 1.10 mg/dL    Calcium 9.8  8.4 - 09.6 mg/dL    GFR calc non Af Amer 43 (*) >90 mL/min    GFR calc Af Amer 50 (*) >90 mL/min   TYPE AND SCREEN     Status: Normal   Collection Time   03/12/12  5:52 PM      Component Value Range Comment   ABO/RH(D) O POS      Antibody Screen NEG      Sample Expiration 03/15/2012     APTT     Status: Normal   Collection Time   03/12/12  5:52 PM      Component Value Range Comment   aPTT 28  24 - 37 seconds   PROTIME-INR     Status: Normal   Collection Time   03/12/12  5:52 PM      Component Value Range Comment   Prothrombin Time 12.9  11.6 - 15.2 seconds    INR 0.95  0.00 - 1.49   ABO/RH     Status: Normal   Collection Time   03/12/12  7:00 PM      Component Value Range Comment   ABO/RH(D) O POS      Ct Head Wo Contrast  03/12/2012  *RADIOLOGY REPORT*  Clinical Data: Seizure and dizziness  CT HEAD WITHOUT CONTRAST  Technique:  Contiguous axial images were obtained from the base of the skull through the vertex without  contrast.  Comparison: MRI 01/30/2009  Findings: Generalized atrophy.  Chronic microvascular ischemic change in the white matter.  No acute infarct.  Negative for hemorrhage or mass.  Calvarium is intact.  IMPRESSION: Atrophy and chronic microvascular ischemia.  No acute abnormality.  Original Report Authenticated By: Camelia Phenes, M.D.    Review of Systems  Constitutional: Negative for fever, chills, weight loss, malaise/fatigue and diaphoresis.  HENT: Negative for hearing loss, ear pain, nosebleeds, congestion, neck pain, tinnitus and ear discharge.   Eyes: Negative for blurred vision, double vision, photophobia, pain, discharge and redness.  Respiratory: Negative for cough, hemoptysis, sputum production, shortness of breath, wheezing and stridor.   Cardiovascular: Negative for chest pain, palpitations, orthopnea, claudication, leg swelling and PND.  Gastrointestinal: Positive for nausea, vomiting and diarrhea. Negative for heartburn, abdominal pain, constipation, blood in stool and melena.  Genitourinary: Negative for dysuria, urgency, frequency, hematuria and flank pain.  Musculoskeletal: Negative for myalgias, back pain, joint pain and falls.  Skin: Positive for rash. Negative for itching.  Neurological: Negative for dizziness, tingling, tremors, sensory change, speech change, focal weakness, seizures, loss of consciousness, weakness and headaches.  Endo/Heme/Allergies: Negative for environmental allergies and polydipsia. Does not bruise/bleed easily.  Psychiatric/Behavioral: Negative for depression, suicidal ideas, hallucinations and substance abuse. The patient is not nervous/anxious and does not have insomnia.     Blood pressure 161/78, pulse 68, temperature 97.6 F (36.4 C), temperature source Oral, resp. rate 17, SpO2 98.00%. Physical Exam  Constitutional: She is oriented to person, place, and time. She appears well-developed  and well-nourished. No distress.  HENT:  Head:  Normocephalic and atraumatic.  Mouth/Throat: No oropharyngeal exudate.  Eyes: Conjunctivae and EOM are normal. Pupils are equal, round, and reactive to light. Right eye exhibits no discharge. Left eye exhibits no discharge. No scleral icterus.  Neck: Normal range of motion. Neck supple. No JVD present. No tracheal deviation present. No thyromegaly present.  Cardiovascular: Normal rate, regular rhythm and normal heart sounds.  Exam reveals no gallop and no friction rub.   No murmur heard. Respiratory: Effort normal and breath sounds normal. No stridor. No respiratory distress. She has no wheezes. She has no rales. She exhibits no tenderness.  GI: Soft. Bowel sounds are normal. She exhibits no distension and no mass. There is no tenderness. There is no rebound and no guarding.  Musculoskeletal: Normal range of motion. She exhibits no edema and no tenderness.  Lymphadenopathy:    She has no cervical adenopathy.  Neurological: She is alert and oriented to person, place, and time. She has normal reflexes. She displays normal reflexes. No cranial nerve deficit. She exhibits normal muscle tone. Coordination normal.  Skin: Skin is warm and dry. No rash noted. She is not diaphoretic. No erythema. No pallor.  Psychiatric: She has a normal mood and affect. Her behavior is normal. Judgment and thought content normal.     Assessment/Plan Near syncope vs Syncope: cycle cardiac markers.  Check eeg, check mri brain noncontrast, carotid u/s, cardiac echo.   please involve neurology in am Orthostatic hypotension:  Hydrate with normal saline,  Hyponatremia:  Check serum osm, tsh , cortisol, urine osm, urine sodium Anemia: mild,  Repeat cbc please   Hypertension: cont diovan Seizure d/o:  Cont keppra Depression/  Cont prozac,  Pt denies being on zoloft. Just changed over about 1 week ago by Talmage Nap 03/12/2012, 8:11 PM

## 2012-03-12 NOTE — ED Provider Notes (Signed)
History     CSN: [id number]  Arrival date & time 03/12/12  1641   First MD Initiated Contact with Patient 03/12/12 1724      Chief Complaint  Patient presents with  . Seizures  . Dizziness     HPI The pt's son heard her fall in the house.  Pt was on the commode and she had vomiting and diarrhea.  SHe had two episodes today of vomiting and diarrhea.  During this spell she had lost consciousness.  These events have been ongoing for about a month.  She saw her doctor last Monday for his and was told that it was from iron deficiency anemia and she has started iron tablets.    After the event today her son called 911 and gave her a ntg in case she was having a heart attack.  Pt does not recall the event.  She was unconscious for  A few minutes and then confused for at least 30 minutes afterward.  Pt denies complaints now.  She feels she has been getting worse and weaker.  This is the first spell in over a year.  No recent abx.  No recent travel. Past Medical History  Diagnosis Date  . SOB (shortness of breath)   . Fatigue   . Anxiety   . Seizure   . Dizziness   . Hot flashes   . Ischemic heart disease   . Hypertension   . Hyperlipidemia   . Coronary artery disease   . Anemia   . Syncope and collapse     RECURRENT, RELATED TO HYPONATREMIA, NARCOTICS, AND BRADYCARDIA  . Tobacco abuse     PAST  . Memory disorder   . Pneumonia   . Flu syndrome january 2013    Past Surgical History  Procedure Date  . Cardiac catheterization 2004    Family History  Problem Relation Age of Onset  . Heart attack Father   . Heart failure Mother     History  Substance Use Topics  . Smoking status: Former Smoker    Quit date: 08/23/1999  . Smokeless tobacco: Never Used  . Alcohol Use: No    OB History    Grav Para Term Preterm Abortions TAB SAB Ect Mult Living                  Review of Systems  Constitutional: Positive for fatigue. Negative for fever.  Respiratory: Negative for  shortness of breath.   Cardiovascular: Negative for chest pain.  Gastrointestinal: Positive for vomiting and diarrhea. Negative for abdominal pain.  Musculoskeletal: Negative for back pain.  Neurological: Positive for headaches.  All other systems reviewed and are negative.    Allergies  Codeine  Home Medications   Current Outpatient Rx  Name Route Sig Dispense Refill  . ACETAMINOPHEN 500 MG PO TABS Oral Take 1,000 mg by mouth every 6 (six) hours as needed. For pain.    Marland Kitchen CLOPIDOGREL BISULFATE 75 MG PO TABS  TAKE ONE (1) TABLET EACH DAY 30 tablet 4  . ESOMEPRAZOLE MAGNESIUM 40 MG PO CPDR Oral Take 40 mg by mouth daily before breakfast.      . FERROUS SULFATE 325 (65 FE) MG PO TABS Oral Take 325 mg by mouth 2 (two) times daily.    Marland Kitchen FLUOXETINE HCL 40 MG PO CAPS Oral Take 40 mg by mouth daily.    Marland Kitchen FLUTICASONE-SALMETEROL 250-50 MCG/DOSE IN AEPB Inhalation Inhale 1 puff into the lungs every 12 (twelve) hours.    Marland Kitchen  FUROSEMIDE 20 MG PO TABS Oral Take 1 tablet (20 mg total) by mouth daily. 30 tablet 6  . ISOSORBIDE MONONITRATE ER 30 MG PO TB24 Oral Take 15 mg by mouth daily.    Marland Kitchen LEVETIRACETAM 500 MG PO TABS Oral Take 500 mg by mouth every 12 (twelve) hours.    . ADULT MULTIVITAMIN W/MINERALS CH Oral Take 1 tablet by mouth daily.    Marland Kitchen PRAVASTATIN SODIUM 80 MG PO TABS Oral Take 1 tablet (80 mg total) by mouth every evening. 30 tablet 6  . PREGABALIN 75 MG PO CAPS Oral Take 75 mg by mouth at bedtime.     . SERTRALINE HCL 100 MG PO TABS Oral Take 50 mg by mouth daily.     Marland Kitchen TIOTROPIUM BROMIDE MONOHYDRATE 18 MCG IN CAPS Inhalation Place 18 mcg into inhaler and inhale daily.    Marland Kitchen VALSARTAN 40 MG PO TABS Oral Take 1 tablet (40 mg total) by mouth daily. 30 tablet 6    BP 145/62  Pulse 60  Temp 97.6 F (36.4 C) (Oral)  Resp 18  SpO2 100%  Physical Exam  Nursing note and vitals reviewed. Constitutional: She appears well-developed and well-nourished. No distress.  HENT:  Head:  Normocephalic and atraumatic.  Right Ear: External ear normal.  Left Ear: External ear normal.  Eyes: Conjunctivae are normal. Right eye exhibits no discharge. Left eye exhibits no discharge. No scleral icterus.  Neck: Neck supple. No tracheal deviation present.  Cardiovascular: Normal rate, regular rhythm and intact distal pulses.   Pulmonary/Chest: Effort normal and breath sounds normal. No stridor. No respiratory distress. She has no wheezes. She has no rales.  Abdominal: Soft. Bowel sounds are normal. She exhibits no distension. There is no tenderness. There is no rebound and no guarding.  Musculoskeletal: She exhibits no edema and no tenderness.  Neurological: She is alert. She has normal strength. No sensory deficit. Cranial nerve deficit:  no gross defecits noted. She exhibits normal muscle tone. She displays no seizure activity. Coordination normal.  Skin: Skin is warm and dry. No rash noted.  Psychiatric: She has a normal mood and affect.    ED Course  Procedures (including critical care time)  Rate: 52  Rhythm: sinus brady  QRS Axis: normal  Intervals: normal  ST/T Wave abnormalities: normal  Conduction Disutrbances:none  Narrative Interpretation:nl  Old EKG Reviewed:nonspecific st changes resolved Labs Reviewed  CBC WITH DIFFERENTIAL - Abnormal; Notable for the following:    RBC 3.66 (*)     Hemoglobin 11.3 (*)     HCT 32.6 (*)     All other components within normal limits  BASIC METABOLIC PANEL - Abnormal; Notable for the following:    Sodium 126 (*)     Chloride 91 (*)     Glucose, Bld 119 (*)     Creatinine, Ser 1.15 (*)     GFR calc non Af Amer 43 (*)     GFR calc Af Amer 50 (*)     All other components within normal limits  TYPE AND SCREEN  APTT  PROTIME-INR  ABO/RH  URINALYSIS, ROUTINE W REFLEX MICROSCOPIC   Ct Head Wo Contrast  03/12/2012  *RADIOLOGY REPORT*  Clinical Data: Seizure and dizziness  CT HEAD WITHOUT CONTRAST  Technique:  Contiguous axial  images were obtained from the base of the skull through the vertex without contrast.  Comparison: MRI 01/30/2009  Findings: Generalized atrophy.  Chronic microvascular ischemic change in the white matter.  No acute infarct.  Negative for hemorrhage or mass.  Calvarium is intact.  IMPRESSION: Atrophy and chronic microvascular ischemia.  No acute abnormality.  Original Report Authenticated By: Camelia Phenes, M.D.     1. Syncope   2. Coronary atherosclerosis of unspecified type of vessel, native or graft   3. Other and unspecified hyperlipidemia   4. Hyponatremia   5. Orthostatic hypotension   6. Seizure       MDM  Patient had an episode of loss of consciousness. Reviewing her old records, she has history of seizures that are apparently not tonic-clonic. The last evaluation by a neurologist indicates that he is unsure if these episodes are truly seizures however antiseizure medications have decreased the severity and frequency in the past. Patient was supposed to follow up with MRI and further testing but she did not followup with that. It is possible that she had a syncopal episode as well considering the history that she described. With her age and comorbidities I will consult the hospitalist for hospitalization and further management.  Patient is noted to be hyponatremic. She does does not appear to be confused or altered at this time.        Celene Kras, MD 03/13/12 [phone]

## 2012-03-12 NOTE — ED Notes (Signed)
Upon entering room to start pt's ns 500cc bolus. MD KIM at bedside at stated to hold off on NS bolus.

## 2012-03-12 NOTE — ED Notes (Signed)
Per EMS pt in from home, son heard a bang in bathroom. Pt was found on bathroom floor. No seizure activity was noted. Family and pt assume that pt had a seizure and has hx of same. Pt now A&Ox's 4.

## 2012-03-12 NOTE — ED Notes (Signed)
Pt placed on cardiac monitor and continuous pulse ox. EKG captured. Seizure precautions initiated. Family at bedside.

## 2012-03-12 NOTE — ED Notes (Signed)
Family reports pt fell off toilet and landed in bathroom. Reports pt was vomiting and having diarrhea at the same time. Family was unsure if pt was breathing or if pt lost consciousness.

## 2012-03-12 NOTE — ED Notes (Signed)
md knapp stated ok for pt to have something to drink.

## 2012-03-13 ENCOUNTER — Inpatient Hospital Stay (HOSPITAL_COMMUNITY): Payer: Medicare HMO

## 2012-03-13 DIAGNOSIS — R55 Syncope and collapse: Secondary | ICD-10-CM

## 2012-03-13 LAB — DIFFERENTIAL
Lymphocytes Relative: 28 % (ref 12–46)
Lymphs Abs: 2.5 10*3/uL (ref 0.7–4.0)
Monocytes Relative: 12 % (ref 3–12)
Neutro Abs: 5.2 10*3/uL (ref 1.7–7.7)
Neutrophils Relative %: 58 % (ref 43–77)

## 2012-03-13 LAB — OSMOLALITY, URINE: Osmolality, Ur: 422 mOsm/kg (ref 390–1090)

## 2012-03-13 LAB — BASIC METABOLIC PANEL
BUN: 13 mg/dL (ref 6–23)
CO2: 22 mEq/L (ref 19–32)
Chloride: 94 mEq/L — ABNORMAL LOW (ref 96–112)
Creatinine, Ser: 1.13 mg/dL — ABNORMAL HIGH (ref 0.50–1.10)
GFR calc Af Amer: 51 mL/min — ABNORMAL LOW (ref >90)
Potassium: 3.9 mEq/L (ref 3.5–5.1)

## 2012-03-13 LAB — TSH: TSH: 2.732 u[IU]/mL (ref 0.350–4.500)

## 2012-03-13 LAB — COMPREHENSIVE METABOLIC PANEL
AST: 19 U/L (ref 0–37)
BUN: 15 mg/dL (ref 6–23)
CO2: 23 mEq/L (ref 19–32)
Calcium: 9.2 mg/dL (ref 8.4–10.5)
Creatinine, Ser: 1.1 mg/dL (ref 0.50–1.10)
GFR calc Af Amer: 52 mL/min — ABNORMAL LOW (ref >90)
GFR calc non Af Amer: 45 mL/min — ABNORMAL LOW (ref >90)

## 2012-03-13 LAB — CARDIAC PANEL(CRET KIN+CKTOT+MB+TROPI)
CK, MB: 1.8 ng/mL (ref 0.3–4.0)
Relative Index: 1.8 (ref 0.0–2.5)
Total CK: 91 U/L (ref 7–177)

## 2012-03-13 LAB — SODIUM, URINE, RANDOM: Sodium, Ur: 68 mEq/L

## 2012-03-13 LAB — CBC
Hemoglobin: 10.4 g/dL — ABNORMAL LOW (ref 12.0–15.0)
RBC: 3.3 MIL/uL — ABNORMAL LOW (ref 3.87–5.11)
WBC: 9.1 10*3/uL (ref 4.0–10.5)

## 2012-03-13 LAB — URINALYSIS, ROUTINE W REFLEX MICROSCOPIC
Bilirubin Urine: NEGATIVE
Ketones, ur: NEGATIVE mg/dL
Nitrite: NEGATIVE
Protein, ur: NEGATIVE mg/dL
Urobilinogen, UA: 0.2 mg/dL (ref 0.0–1.0)

## 2012-03-13 LAB — OSMOLALITY: Osmolality: 271 mOsm/kg — ABNORMAL LOW (ref 275–300)

## 2012-03-13 LAB — URINE MICROSCOPIC-ADD ON

## 2012-03-13 MED ORDER — LEVETIRACETAM 750 MG PO TABS
750.0000 mg | ORAL_TABLET | Freq: Two times a day (BID) | ORAL | Status: DC
  Administered 2012-03-13 – 2012-03-15 (×4): 750 mg via ORAL
  Filled 2012-03-13 (×6): qty 1

## 2012-03-13 MED ORDER — PREGABALIN 75 MG PO CAPS
75.0000 mg | ORAL_CAPSULE | Freq: Every day | ORAL | Status: DC
  Administered 2012-03-13 – 2012-03-14 (×2): 75 mg via ORAL
  Filled 2012-03-13 (×2): qty 1

## 2012-03-13 NOTE — Progress Notes (Signed)
[facility] PROGRESS NOTE  [name] [name] ZOX:[id number] DOB: 09-07-1926 DOA: 03/12/2012 PCP: [name], MD  Assessment/Plan: Principal Problem:  *Syncope and collapse Active Problems:  Seizure  Orthostatic hypotension  Hyponatremia  1. Syncope versus seizure, most likely seizure EEG pending, MRI of the brain discontinued because the CT scan is negative no gross focal neurologic deficits, awaiting carotid ultrasound carotid echo 2. Seizures Keppra increased to 750 mg IV twice a day 3. Hyponatremia secondary to Prozac, recently started by PCP, fluid restriction, gentle IV hydration to see the sodium improves 4. Hypertension continue Diovan 5. Anemia stable no other signs of bleeding 6. Depression Prozac discontinued  Code Status: full Family Communication: family updated about patient's clinical progress Disposition Plan:  As above       HPI/Subjective: No complaints  Objective: Filed Vitals:   03/12/12 2300 03/13/12 0500 03/13/12 0918 03/13/12 1006  BP: 126/77 139/94  94/54  Pulse: 57 53  85  Temp: 98 F (36.7 C) 98.5 F (36.9 C)    TempSrc: Oral Oral    Resp: 17 17    Height:      Weight:  64.3 kg (141 lb 12.1 oz)    SpO2: 96% 97% 92%     Intake/Output Summary (Last 24 hours) at 03/13/12 1202 Last data filed at 03/13/12 1000  Gross per 24 hour  Intake    477 ml  Output    625 ml  Net   -148 ml    Exam:  HENT:  Head: Atraumatic.  Nose: Nose normal.  Mouth/Throat: Oropharynx is clear and moist.  Eyes: Conjunctivae are normal. Pupils are equal, round, and reactive to light. No scleral icterus.  Neck: Neck supple. No tracheal deviation present.  Cardiovascular: Normal rate, regular rhythm, normal heart sounds and intact distal pulses.  Pulmonary/Chest: Effort normal and breath sounds normal. No respiratory distress.  Abdominal: Soft. Normal appearance and bowel sounds are normal. She exhibits no distension. There is no tenderness.    Musculoskeletal: She exhibits no edema and no tenderness.  Neurological: She is alert. No cranial nerve deficit.    Data Reviewed: Basic Metabolic Panel:  Lab 03/13/12 8119 03/13/12 0507 03/12/12 1722  NA 126* 128* 126*  K 3.9 4.1 4.6  CL 94* 94* 91*  CO2 22 23 23   GLUCOSE 111* 98 119*  BUN 13 15 15   CREATININE 1.13* 1.10 1.15*  CALCIUM 9.0 9.2 9.8  MG -- -- --  PHOS -- -- --    Liver Function Tests:  Lab 03/13/12 0507  AST 19  ALT 10  ALKPHOS 58  BILITOT 0.3  PROT 6.5  ALBUMIN 3.6   No results found for this basename: LIPASE:5,AMYLASE:5 in the last 168 hours No results found for this basename: AMMONIA:5 in the last 168 hours  CBC:  Lab 03/13/12 0507 03/12/12 1722  WBC 9.1 8.3  NEUTROABS 5.2 5.6  HGB 10.4* 11.3*  HCT 29.2* 32.6*  MCV 88.5 89.1  PLT 311 335    Cardiac Enzymes:  Lab 03/13/12 1053 03/13/12 0501 03/12/12 2245  CKTOTAL 113 91 81  CKMB 2.0 1.8 1.6  CKMBINDEX -- -- --  TROPONINI <0.30 <0.30 <0.30   BNP (last 3 results)  Basename 10/03/11 1436 05/26/11 1454  PROBNP 97.0 277.0*     CBG: No results found for this basename: GLUCAP:5 in the last 168 hours  No results found for this or any previous visit (from the past 240 hour(s)).   Studies: Ct Head Wo Contrast  03/12/2012  *  RADIOLOGY REPORT*  Clinical Data: Seizure and dizziness  CT HEAD WITHOUT CONTRAST  Technique:  Contiguous axial images were obtained from the base of the skull through the vertex without contrast.  Comparison: MRI 01/30/2009  Findings: Generalized atrophy.  Chronic microvascular ischemic change in the white matter.  No acute infarct.  Negative for hemorrhage or mass.  Calvarium is intact.  IMPRESSION: Atrophy and chronic microvascular ischemia.  No acute abnormality.  Original Report Authenticated By: Camelia Phenes, M.D.    Scheduled Meds:   . clopidogrel  75 mg Oral Q breakfast  . ferrous sulfate  325 mg Oral BID  . Fluticasone-Salmeterol  1 puff Inhalation Q12H   . irbesartan  75 mg Oral Daily  . isosorbide mononitrate  15 mg Oral Daily  . levETIRAcetam  750 mg Oral Q12H  . pantoprazole  40 mg Oral Daily  . pregabalin  75 mg Oral QHS  . simvastatin  40 mg Oral q1800  . sodium chloride  3 mL Intravenous Q12H  . tiotropium  18 mcg Inhalation Daily  . DISCONTD: FLUoxetine  40 mg Oral Daily  . DISCONTD: levETIRAcetam  500 mg Oral Q12H  . DISCONTD: pregabalin  75 mg Oral QHS  . DISCONTD: sodium chloride  500 mL Intravenous Once   Continuous Infusions:   . sodium chloride 60 mL/hr at 03/13/12 0610  . DISCONTD: sodium chloride 1,000 mL (03/12/12 1827)    Principal Problem:  *Syncope and collapse Active Problems:  Seizure  Orthostatic hypotension  Hyponatremia    Time spent: 40 minutes   [facility]  [facility] Pager [phone]. If 8PM-8AM, please contact night-coverage at [email or web address], password [facility] 03/13/2012, 12:02 PM  LOS: 1 day

## 2012-03-13 NOTE — Care Management Note (Signed)
    Page 1 of 1   03/15/2012     3:46:49 PM   CARE MANAGEMENT NOTE 03/15/2012  Patient:  [name] [name], [name] [name]   Account Number:  [id number]  Date Initiated:  03/13/2012  Documentation initiated by:  Lanier Clam  Subjective/Objective Assessment:   ADMITTED W/SYNCOPE.     Action/Plan:   FROM HOME W/CHILDREN.   Anticipated DC Date:  03/15/2012   Anticipated DC Plan:  HOME W HOME HEALTH SERVICES      DC Planning Services  CM consult      Choice offered to / List presented to:  C-1 Patient        HH arranged  HH-2 PT  HH-4 NURSE'S AIDE      HH agency  [facility]   Status of service:  Completed, signed off Medicare Important Message given?   (If response is "NO", the following Medicare IM given date fields will be blank) Date Medicare IM given:   Date Additional Medicare IM given:    Discharge Disposition:  HOME W HOME HEALTH SERVICES  Per UR Regulation:  Reviewed for med. necessity/level of care/duration of stay  If discussed at Long Length of Stay Meetings, dates discussed:    Comments:  03/15/12 Desarai Barrack RN,BSN NCM [phone] [facility]) CONTACTED FOR HH PT/AIDE, & ABOUT D/C TODAY.  03/13/12 Tenasia Aull RN,BSN NCM [phone]

## 2012-03-13 NOTE — Plan of Care (Signed)
Problem: Phase I Progression Outcomes Goal: Voiding-avoid urinary catheter unless indicated Outcome: Completed/Met Date Met:  03/13/12 Up to bathroom to void, no issues. Continent.

## 2012-03-13 NOTE — Plan of Care (Signed)
Problem: Phase II Progression Outcomes Goal: Progress activity as tolerated unless otherwise ordered Outcome: Completed/Met Date Met:  03/13/12 Pt up walking with stand by assist now instead of contact guard. Stable gait, aware of safety.

## 2012-03-13 NOTE — Progress Notes (Signed)
  Echocardiogram 2D Echocardiogram has been performed.  Veleka Djordjevic 03/13/2012, 3:28 PM

## 2012-03-13 NOTE — Progress Notes (Signed)
*  PRELIMINARY RESULTS* Vascular Ultrasound Carotid Duplex (Doppler) has been completed.  Preliminary findings: Bilaterally no significant ICA stenosis with antegrade vertebral flow.  EUNICE, Alfretta Pinch RDMS 03/13/2012, 10:17 AM

## 2012-03-13 NOTE — Progress Notes (Signed)
Patient arrived to floor around 2200 pm last night.  Patient arrived via stretcher.  She was alert and oriented and very pleasant.  Will continue to monitor throughout the night.Kenton Kingfisher [place]

## 2012-03-14 ENCOUNTER — Ambulatory Visit: Payer: Medicare HMO | Admitting: Neurology

## 2012-03-14 ENCOUNTER — Inpatient Hospital Stay (HOSPITAL_COMMUNITY)
Admit: 2012-03-14 | Discharge: 2012-03-14 | Disposition: A | Discharge status: Home / Self Care | Payer: Medicare HMO | Attending: Internal Medicine | Admitting: Internal Medicine

## 2012-03-14 DIAGNOSIS — R569 Unspecified convulsions: Secondary | ICD-10-CM

## 2012-03-14 DIAGNOSIS — I10 Essential (primary) hypertension: Secondary | ICD-10-CM

## 2012-03-14 LAB — CBC
HCT: 30.5 % — ABNORMAL LOW (ref 36.0–46.0)
Hemoglobin: 10.7 g/dL — ABNORMAL LOW (ref 12.0–15.0)
MCV: 89.4 fL (ref 78.0–100.0)
RDW: 12.3 % (ref 11.5–15.5)
WBC: 8.7 10*3/uL (ref 4.0–10.5)

## 2012-03-14 LAB — BASIC METABOLIC PANEL
BUN: 11 mg/dL (ref 6–23)
Chloride: 99 mEq/L (ref 96–112)
Creatinine, Ser: 0.98 mg/dL (ref 0.50–1.10)
Glucose, Bld: 112 mg/dL — ABNORMAL HIGH (ref 70–99)
Potassium: 4.1 mEq/L (ref 3.5–5.1)

## 2012-03-14 NOTE — Procedures (Signed)
EEG NUMBER:  [id number].  This routine EEG was requested in this 78 year old female who has a history of syncope as well as possible history of seizures.  Her medications include levetiracetam and pregabalin.  The EEG was done with the patient awake.  During periods of maximal wakefulness, she had background activities composed of very low amplitude beta activities that were symmetric.  There was a low to very low amplitude alpha rhythm infrequently seen that had a 9-10 cycle per second frequency.  Photic stimulation produced a symmetric driving response.  The patient was not hyperventilated.  The patient did not sleep.  EKG revealed a normal sinus rhythm.  CLINICAL INTERPRETATION:  This routine EEG done with the patient awake is normal.          ______________________________ Denton Meek, MD    OZ:HYQM D:  03/14/2012 17:21:36  T:  03/14/2012 17:50:40  Job #:  [id number]

## 2012-03-14 NOTE — Evaluation (Signed)
Physical Therapy Evaluation Patient Details Name: [name] [name] MRN: [id number] DOB: 08/03/1927 Today's Date: 03/14/2012 Time: 7829-5621 PT Time Calculation (min): 21 min  PT Assessment / Plan / Recommendation Clinical Impression  Pt admitted for syncope vs seizure.  Pt would benefit from acute PT services in order to improve independence with transfers and ambulation in preparation for d/c home with family to assist as needed.  Pt reported no dizziness or SOB with activity.  Pt reports her main problem is feeling weakness.    PT Assessment  Patient needs continued PT services    Follow Up Recommendations  Home health PT    Barriers to Discharge        Equipment Recommendations  None recommended by PT    Recommendations for Other Services     Frequency Min 3X/week    Precautions / Restrictions Precautions Precautions: Fall Restrictions Weight Bearing Restrictions: No   Pertinent Vitals/Pain No pain Orthostatics: Lying 106/62 mmHg Sitting 131/63 mmHg Standing 121/62 mmHg       Mobility  Bed Mobility Bed Mobility: Supine to Sit Supine to Sit: 4: Min assist;HOB flat Details for Bed Mobility Assistance: assist for trunk Transfers Transfers: Stand to Sit;Sit to Stand Sit to Stand: 4: Min guard;With upper extremity assist;From bed Stand to Sit: 4: Min guard;With upper extremity assist;To chair/3-in-1 Details for Transfer Assistance: verbal cues for safe technique Ambulation/Gait Ambulation/Gait Assistance: 4: Min guard Ambulation Distance (Feet): 160 Feet Assistive device: None Ambulation/Gait Assistance Details: pt ambulated on room air with SaO2 remaining around 92% and not dropping below 90% Gait Pattern: Step-through pattern;Decreased stride length Gait velocity: decreased    Exercises     PT Diagnosis: Generalized weakness  PT Problem List: Decreased strength;Decreased activity tolerance;Decreased mobility;Decreased knowledge of use of DME PT Treatment  Interventions: DME instruction;Gait training;Functional mobility training;Therapeutic exercise;Therapeutic activities;Patient/family education   PT Goals Acute Rehab PT Goals PT Goal Formulation: With patient Time For Goal Achievement: 03/21/12 Potential to Achieve Goals: Good Pt will go Supine/Side to Sit: with modified independence PT Goal: Supine/Side to Sit - Progress: Goal set today Pt will go Sit to Supine/Side: with modified independence PT Goal: Sit to Supine/Side - Progress: Goal set today Pt will go Sit to Stand: with supervision PT Goal: Sit to Stand - Progress: Goal set today Pt will go Stand to Sit: with supervision PT Goal: Stand to Sit - Progress: Goal set today Pt will Ambulate: >150 feet;with supervision;with least restrictive assistive device PT Goal: Ambulate - Progress: Goal set today  Visit Information  Last PT Received On: 03/14/12 Assistance Needed: +1    Subjective Data  Subjective: I'm so glad you've come.   Prior Functioning  Home Living Lives With: Son Available Help at Discharge: Family Type of Home: House Home Access: Level entry Home Layout: One level Home Adaptive Equipment: Straight cane;Walker - rolling;Shower chair with back;Bedside commode/3-in-1 Additional Comments: Pt reports her son is usually home but if not, other family members are usually present. Prior Function Level of Independence: Independent Communication Communication: No difficulties    Cognition  Overall Cognitive Status: Appears within functional limits for tasks assessed/performed Arousal/Alertness: Awake/alert Orientation Level: Appears intact for tasks assessed Behavior During Session: [facility] for tasks performed    Extremity/Trunk Assessment Right Upper Extremity Assessment RUE ROM/Strength/Tone: [facility] for tasks assessed Left Upper Extremity Assessment LUE ROM/Strength/Tone: [facility] for tasks assessed Right Lower Extremity Assessment RLE ROM/Strength/Tone: [facility] for tasks  assessed Left Lower Extremity Assessment LLE ROM/Strength/Tone: [facility] for tasks assessed  Balance Balance Balance Assessed: No (pt reports no falls at home)  End of Session PT - End of Session Equipment Utilized During Treatment: Gait belt Activity Tolerance: Patient tolerated treatment well Patient left: in chair;with call bell/phone within reach Nurse Communication: Mobility status;Other (comment) (pt up in chair)  GP     Darol Cush,KATHrine E 03/14/2012, 12:16 PM Pager: [phone]

## 2012-03-14 NOTE — Progress Notes (Signed)
Portable EEG completed at [facility]. 

## 2012-03-14 NOTE — Progress Notes (Signed)
[facility] PROGRESS NOTE  [name] [name] ZOX:[id number] DOB: 1927/07/18 DOA: 03/12/2012 PCP: [name], MD  Assessment/Plan: Principal Problem:  *Syncope and collapse Active Problems:  Seizure  Orthostatic hypotension  Hyponatremia  1. Syncope versus seizure, most likely seizure EEG pending, MRI of the brain discontinued because the CT scan is negative no gross focal neurologic deficits, awaiting carotid ultrasound carotid echo. Recheck orthostatic vitals in a.m. PT OT following 2. Seizures Keppra increased to 750 mg twice a day, await EEG, will further discuss patient with her neurologist Dr. Modesto Charon in a.m. 3. Hyponatremia secondary to Prozac versus from depletion, recently started by PCP-sodium improved with hydration and off Prozac.  4. Hypertension continue Diovan 5. Anemia stable no other signs of bleeding 6. Depression Prozac discontinued on admission  Code Status: full Family Communication:  Disposition Plan:  As above       HPI/Subjective: States she feels weak, denies chest pain, no shortness of breath. no seizures reported per nursing.  Objective: Filed Vitals:   03/14/12 1016 03/14/12 1017 03/14/12 1018 03/14/12 1424  BP: 106/62 131/63 121/62 117/59  Pulse: 66  88 69  Temp:    97.8 F (36.6 C)  TempSrc:    Oral  Resp:    18  Height:      Weight:      SpO2: 96%  93% 93%    Intake/Output Summary (Last 24 hours) at 03/14/12 1554 Last data filed at 03/14/12 1425  Gross per 24 hour  Intake    720 ml  Output   1750 ml  Net  -1030 ml    Exam:  HENT:  Head: Atraumatic.  Nose: Nose normal.  Mouth/Throat: Oropharynx is clear and moist.  Eyes: Conjunctivae are normal. Pupils are equal, round, and reactive to light. No scleral icterus.  Neck: Neck supple. No tracheal deviation present.  Cardiovascular: Normal rate, regular rhythm, normal heart sounds and intact distal pulses.  Pulmonary/Chest: Effort normal and breath sounds normal. No respiratory  distress.  Abdominal: Soft. Normal appearance and bowel sounds are normal. She exhibits no distension. There is no tenderness.  Musculoskeletal: She exhibits no edema and no tenderness.  Neurological: She is alert. No cranial nerve deficit.    Data Reviewed: Basic Metabolic Panel:  Lab 03/14/12 8119 03/13/12 1053 03/13/12 0507 03/12/12 1722  NA 133* 126* 128* 126*  K 4.1 3.9 4.1 4.6  CL 99 94* 94* 91*  CO2 21 22 23 23   GLUCOSE 112* 111* 98 119*  BUN 11 13 15 15   CREATININE 0.98 1.13* 1.10 1.15*  CALCIUM 9.4 9.0 9.2 9.8  MG -- -- -- --  PHOS -- -- -- --    Liver Function Tests:  Lab 03/13/12 0507  AST 19  ALT 10  ALKPHOS 58  BILITOT 0.3  PROT 6.5  ALBUMIN 3.6   No results found for this basename: LIPASE:5,AMYLASE:5 in the last 168 hours No results found for this basename: AMMONIA:5 in the last 168 hours  CBC:  Lab 03/14/12 0453 03/13/12 0507 03/12/12 1722  WBC 8.7 9.1 8.3  NEUTROABS -- 5.2 5.6  HGB 10.7* 10.4* 11.3*  HCT 30.5* 29.2* 32.6*  MCV 89.4 88.5 89.1  PLT 338 311 335    Cardiac Enzymes:  Lab 03/13/12 1053 03/13/12 0501 03/12/12 2245  CKTOTAL 113 91 81  CKMB 2.0 1.8 1.6  CKMBINDEX -- -- --  TROPONINI <0.30 <0.30 <0.30   BNP (last 3 results)  Basename 10/03/11 1436 05/26/11 1454  PROBNP 97.0 277.0*  CBG: No results found for this basename: GLUCAP:5 in the last 168 hours  No results found for this or any previous visit (from the past 240 hour(s)).   Studies: Ct Head Wo Contrast  03/12/2012  *RADIOLOGY REPORT*  Clinical Data: Seizure and dizziness  CT HEAD WITHOUT CONTRAST  Technique:  Contiguous axial images were obtained from the base of the skull through the vertex without contrast.  Comparison: MRI 01/30/2009  Findings: Generalized atrophy.  Chronic microvascular ischemic change in the white matter.  No acute infarct.  Negative for hemorrhage or mass.  Calvarium is intact.  IMPRESSION: Atrophy and chronic microvascular ischemia.  No acute  abnormality.  Original Report Authenticated By: Camelia Phenes, M.D.    Scheduled Meds:    . clopidogrel  75 mg Oral Q breakfast  . ferrous sulfate  325 mg Oral BID  . Fluticasone-Salmeterol  1 puff Inhalation Q12H  . irbesartan  75 mg Oral Daily  . isosorbide mononitrate  15 mg Oral Daily  . levETIRAcetam  750 mg Oral Q12H  . pantoprazole  40 mg Oral Daily  . pregabalin  75 mg Oral QHS  . simvastatin  40 mg Oral q1800  . sodium chloride  3 mL Intravenous Q12H  . tiotropium  18 mcg Inhalation Daily   Continuous Infusions:    . sodium chloride 75 mL/hr at 03/14/12 0126    Principal Problem:  *Syncope and collapse Active Problems:  Seizure  Orthostatic hypotension  Hyponatremia    Time spent: 30 minutes   [facility]  [facility] Pager [phone]. If 8PM-8AM, please contact night-coverage at [email or web address], password [facility] 03/14/2012, 3:54 PM  LOS: 2 days

## 2012-03-15 LAB — BASIC METABOLIC PANEL
BUN: 12 mg/dL (ref 6–23)
CO2: 21 mEq/L (ref 19–32)
Calcium: 8.9 mg/dL (ref 8.4–10.5)
Chloride: 102 mEq/L (ref 96–112)
Creatinine, Ser: 1.02 mg/dL (ref 0.50–1.10)
Glucose, Bld: 92 mg/dL (ref 70–99)

## 2012-03-15 MED ORDER — LEVETIRACETAM 500 MG PO TABS: 750.0000 mg | ORAL_TABLET | Freq: Two times a day (BID) | ORAL | Status: DC

## 2012-03-15 NOTE — Discharge Summary (Signed)
Physician Discharge Summary  [name] [name] ZOX:[id number] DOB: 09-Dec-1926 DOA: 03/12/2012  PCP: [name], MD  Admit date: 03/12/2012 Discharge date: 03/15/2012  Recommendations for Outpatient Follow-up:  Follow-up Information    Follow up with [name], MD. (in 1-2weeks, call for appt upon discharge)    Contact information:   [address] [place] [address] [phone]       Follow up with Denton Meek, MD. (in 1-2weeks, call for appt upon discharge)    Contact information:   [address] [facility] [place] [address] [phone]          Discharge Diagnoses:  Principal Problem:  *Syncope and collapse Active Problems:  Seizure  Orthostatic hypotension  Hyponatremia   Discharge Condition: improved/stable  Diet recommendation: low sodium heart healthy  History of present illness:  HPI: 78 yo female with hx of cad , syncope has c/o feeling generally weak and went to bathroom and passed out after getting u p from commode, and then ? Had seizure , and then had incontinence, bowel and bladder and n/v, (no blood). Pt is not sure how long she had loc. No tonic clonic movements noted by son. Pt denies cp, palp, sob, focal neurological symptoms, of numbness, tingling weakness. Pt brought to ED.  In ED CT brain negative for any acute process.  EKG: nsr at 50, nl axis, early R progression. Pt was alert and oriented x3. She denied missing any keppra doses.    Hospital Course by problem list:  1. Syncope -seizure Vs orthostatic hypotension.Given the above history pt's keppra dose was increased to 750mg  upon admission. An EEG was done and showed no epileptiform activity. Pt was noted to be orthostatic per admission vitals and was adequately hydrated with IVF. CT scan is negative no gross focal neurologic deficits, carotid ultrasound carotid echo were done and the results are as listed below no embolic source reported and no ICA  stenosis. I discussed pt with her neurologist Dr Modesto Charon and the impression was that the syncope was more likely orhtostatic, but agreed with keeping the pt on the increased dose of keppra and pt is to follow up with him outpt.  PT OT was consulted and recommended [facility] services. Pt improved clinically, did not have any syncopal episodes in the hospital and was discharged home.   2. H/o Seizures- as above, Keppra increased to 750 mg twice a day,and EEG was neg. Out pt f/u with neurologist Dr. Modesto Charon as above. 3. Hyponatremia -  secondary to versus from volume depletion vs Prozac. Pt had recently been started by PCP(and she was also on zoloft) - sodium improved with hydration and off Prozac.  4. Hypertension - pt is to continue Diovan upon d/c. 5. Anemia - h/h stable,  no  signs of bleeding 6. Depression - conitnue zoloft upon d/c, Prozac discontinued on admission 7. Orthostatic hypotension - as discussed above, pt was adequately hydrated with ivf and she improved clinically.  Procedures:  Echo  Study Conclusions  - Left ventricle: The cavity size was normal. There was mild concentric hypertrophy. Systolic function was normal. The estimated ejection fraction was in the range of 60% to 65%. Wall motion was normal; there were no regional wall motion abnormalities. Doppler parameters are consistent with abnormal left ventricular relaxation (grade 1 diastolic dysfunction). The E/e' ratio is >10, suggesting elevated LV filling pressure. - Left atrium: The atrium was mildly dilated. - Right atrium: The atrium was at the upper  limits of normal in size. - Inferior vena cava: The vessel was normal in size; the respirophasic diameter changes were in the normal range (= 50%); findings are consistent with normal central venous pressure. - Pericardium, extracardiac: There was no pericardial effusion. Transthoracic echocardiography.  Carotid dopplers Summary: No significant extracranial carotid artery  stenosis demonstrated. Vertebrals are patent with antegrade flow.  Other specific details can be found in the table(s) above. Prepared and Electronically Authenticated by  Cari Caraway  EEG CLINICAL INTERPRETATION: This routine EEG done with the patient awake  is normal.  Consultations: none  Discharge Exam: Filed Vitals:   03/15/12 1234  BP: 126/59  Pulse: 65  Temp: 98.5 F (36.9 C)  Resp: 20   Filed Vitals:   03/15/12 1048 03/15/12 1049 03/15/12 1050 03/15/12 1234  BP: 116/64 118/64 135/65 126/59  Pulse: 76 89 94 65  Temp:    98.5 F (36.9 C)  TempSrc:    Oral  Resp:    20  Height:      Weight:      SpO2:    95%   Exam:  HENT:  Head: Atraumatic.  Nose: Nose normal.  Mouth/Throat: Oropharynx is clear and moist.  Eyes: Conjunctivae are normal. Pupils are equal, round, and reactive to light. No scleral icterus.  Neck: Neck supple. No tracheal deviation present.  Cardiovascular: Normal rate, regular rhythm, normal heart sounds and intact distal pulses.  Pulmonary/Chest: Effort normal and breath sounds normal. No respiratory distress.  Abdominal: Soft. Normal appearance and bowel sounds are normal. She exhibits no distension. There is no tenderness.  Musculoskeletal: She exhibits no edema and no tenderness.  Neurological: She is alert. No cranial nerve deficit.     Discharge Instructions   Medication List  As of 03/15/2012  3:27 PM   STOP taking these medications         FLUoxetine 40 MG capsule      furosemide 20 MG tablet         TAKE these medications         acetaminophen 500 MG tablet   Commonly known as: TYLENOL   Take 1,000 mg by mouth every 6 (six) hours as needed. For pain.      clopidogrel 75 MG tablet   Commonly known as: PLAVIX   TAKE ONE (1) TABLET EACH DAY      esomeprazole 40 MG capsule   Commonly known as: NEXIUM   Take 40 mg by mouth daily before breakfast.      ferrous sulfate 325 (65 FE) MG tablet   Take 325 mg by mouth 2  (two) times daily.      Fluticasone-Salmeterol 250-50 MCG/DOSE Aepb   Commonly known as: ADVAIR   Inhale 1 puff into the lungs every 12 (twelve) hours.      isosorbide mononitrate 30 MG 24 hr tablet   Commonly known as: IMDUR   Take 15 mg by mouth daily.      levETIRAcetam 500 MG tablet   Commonly known as: KEPPRA   Take 1.5 tablets (750 mg total) by mouth every 12 (twelve) hours.      multivitamin with minerals Tabs   Take 1 tablet by mouth daily.      pravastatin 80 MG tablet   Commonly known as: PRAVACHOL   Take 1 tablet (80 mg total) by mouth every evening.      pregabalin 75 MG capsule   Commonly known as: LYRICA   Take 75 mg by mouth at  bedtime.      sertraline 100 MG tablet   Commonly known as: ZOLOFT   Take 50 mg by mouth daily.      tiotropium 18 MCG inhalation capsule   Commonly known as: SPIRIVA   Place 18 mcg into inhaler and inhale daily.      valsartan 40 MG tablet   Commonly known as: DIOVAN   Take 1 tablet (40 mg total) by mouth daily.           Follow-up Information    Follow up with [name], MD. (in 1-2weeks, call for appt upon discharge)    Contact information:   [address] [place] [address] [phone]       Follow up with Denton Meek, MD. (in 1-2weeks, call for appt upon discharge)    Contact information:   [address] [facility] [place] [address] [phone]           The results of significant diagnostics from this hospitalization (including imaging, microbiology, ancillary and laboratory) are listed below for reference.    Significant Diagnostic Studies: Dg Chest 2 View  03/13/2012  *RADIOLOGY REPORT*  Clinical Data: Short of breath, rule out pneumonia  CHEST - 2 VIEW  Comparison: 05/31/2010  Findings: Heart size is normal.  Negative for heart failure.  Small left effusion is present which was not identified on the prior study.  Right lung is clear.  Negative for  pneumonia.  Hiatal hernia.  Apical pleural scarring bilaterally.  IMPRESSION: Small left pleural effusion without evidence of heart failure or pneumonia.  Original Report Authenticated By: Camelia Phenes, M.D.   Ct Head Wo Contrast  03/12/2012  *RADIOLOGY REPORT*  Clinical Data: Seizure and dizziness  CT HEAD WITHOUT CONTRAST  Technique:  Contiguous axial images were obtained from the base of the skull through the vertex without contrast.  Comparison: MRI 01/30/2009  Findings: Generalized atrophy.  Chronic microvascular ischemic change in the white matter.  No acute infarct.  Negative for hemorrhage or mass.  Calvarium is intact.  IMPRESSION: Atrophy and chronic microvascular ischemia.  No acute abnormality.  Original Report Authenticated By: Camelia Phenes, M.D.    Microbiology: No results found for this or any previous visit (from the past 240 hour(s)).   Labs: Basic Metabolic Panel:  Lab 03/15/12 5284 03/14/12 1415 03/13/12 1053 03/13/12 0507 03/12/12 1722  NA 134* 133* 126* 128* 126*  K 4.2 4.1 3.9 4.1 4.6  CL 102 99 94* 94* 91*  CO2 21 21 22 23 23   GLUCOSE 92 112* 111* 98 119*  BUN 12 11 13 15 15   CREATININE 1.02 0.98 1.13* 1.10 1.15*  CALCIUM 8.9 9.4 9.0 9.2 9.8  MG -- -- -- -- --  PHOS -- -- -- -- --   Liver Function Tests:  Lab 03/13/12 0507  AST 19  ALT 10  ALKPHOS 58  BILITOT 0.3  PROT 6.5  ALBUMIN 3.6   No results found for this basename: LIPASE:5,AMYLASE:5 in the last 168 hours No results found for this basename: AMMONIA:5 in the last 168 hours CBC:  Lab 03/14/12 0453 03/13/12 0507 03/12/12 1722  WBC 8.7 9.1 8.3  NEUTROABS -- 5.2 5.6  HGB 10.7* 10.4* 11.3*  HCT 30.5* 29.2* 32.6*  MCV 89.4 88.5 89.1  PLT 338 311 335   Cardiac Enzymes:  Lab 03/13/12 1053 03/13/12 0501 03/12/12 2245  CKTOTAL 113 91 81  CKMB 2.0 1.8 1.6  CKMBINDEX -- -- --  TROPONINI <0.30 <0.30 <0.30   BNP: BNP (last 3 results)  Basename 10/03/11 1436 05/26/11 1454  PROBNP 97.0  277.0*   CBG: No results found for this basename: GLUCAP:5 in the last 168 hours  Time coordinating discharge: >87mins  Signed:  Fitzgerald Dunne C  [facility] 03/15/2012, 3:27 PM

## 2012-04-06 ENCOUNTER — Ambulatory Visit (INDEPENDENT_AMBULATORY_CARE_PROVIDER_SITE_OTHER): Payer: Medicare HMO | Admitting: Neurology

## 2012-04-06 ENCOUNTER — Encounter: Payer: Self-pay | Admitting: Neurology

## 2012-04-06 VITALS — BP 118/58 | HR 72 | Wt 144.0 lb

## 2012-04-06 DIAGNOSIS — R569 Unspecified convulsions: Secondary | ICD-10-CM

## 2012-04-06 NOTE — Progress Notes (Signed)
Dear Dr. Cyndia Bent,  I saw  [name] [name] back in [facility] for her problem with poorly characterized epilepsy.  As you may recall, she is a 78 y.o. year old female with a history of "memory disorder", hypertension, HLD who has had seizures for six years.  She has only been on Keppra for the last 2 years and has had no other medications.  Workup apparently has been negative which has included an MRI brain and EEG.  Her seizures are poorly described but apparently she has behavioral arrest, sometimes with vomiting and bowel incontinence as well as post event confusion.  At her last visit as she had not had a seizure on Keppra 500 bid so I continued her at that dose.    Unfortunately in the interim, she had another seizure that resulted in her being hospitalized.  She apparently was feeling weak, and then went to go the bathroom, she then "passed out" and then fell to the ground.  She lost control of her bowels and was confused afterwards.  In hospital she was noted to have a sodium of 126 that was corrected to 134 when she left.  CT head revealed chronic white matter disease.  She was also felt to be dehydrated.  After discussing with me her spell, I decided to increase her Keppra to 750 bid, although I was unsure whether this was a seizure.  Notably I read a routine EEG in hospital that was normal, if not low voltage.  Since then, she has felt increasing fatigue.  Her fatigue has been long standing, and seems to correlate with depression since the loss of several family members starting in 2011.  She says she wants to sleep all the time.  You put her on sertraline and increased it to 100mg .  She is unsure whether this helped.  She hypothesizes that you decreased it to 50mg  to see it that helped her fatigue.  She endorses sleeping well.  At her last visit she was also having new onset headache and pulsatile tinnitus.  I arranged at [facility] head as well as an MRI brain, but she was unable to go  because she felt weak and tired.  Since then her head pain has gone away.  Notably ESR and CRP were normal and I had a low suspicion for TA.   Medical history, social history, and family history were reviewed and have not changed since the last clinic visit.  Current Outpatient Prescriptions on File Prior to Visit  Medication Sig Dispense Refill  . acetaminophen (TYLENOL) 500 MG tablet Take 1,000 mg by mouth every 6 (six) hours as needed. For pain.      Marland Kitchen clopidogrel (PLAVIX) 75 MG tablet TAKE ONE (1) TABLET EACH DAY  30 tablet  4  . esomeprazole (NEXIUM) 40 MG capsule Take 40 mg by mouth daily before breakfast.        . ferrous sulfate 325 (65 FE) MG tablet Take 325 mg by mouth 2 (two) times daily.      . isosorbide mononitrate (IMDUR) 30 MG 24 hr tablet Take 15 mg by mouth daily.      Marland Kitchen levETIRAcetam (KEPPRA) 500 MG tablet Take 1.5 tablets (750 mg total) by mouth every 12 (twelve) hours.  60 tablet  0  . Multiple Vitamin (MULTIVITAMIN WITH MINERALS) TABS Take 1 tablet by mouth daily.      . pravastatin (PRAVACHOL) 80 MG tablet Take 1 tablet (80 mg total) by mouth every evening.  30 tablet  6  . pregabalin (LYRICA) 75 MG capsule Take 75 mg by mouth at bedtime.       . sertraline (ZOLOFT) 100 MG tablet Take 50 mg by mouth daily.       Marland Kitchen tiotropium (SPIRIVA) 18 MCG inhalation capsule Place 18 mcg into inhaler and inhale daily.      . valsartan (DIOVAN) 40 MG tablet Take 1 tablet (40 mg total) by mouth daily.  30 tablet  6  . Fluticasone-Salmeterol (ADVAIR) 250-50 MCG/DOSE AEPB Inhale 1 puff into the lungs every 12 (twelve) hours.        Allergies  Allergen Reactions  . Codeine     ROS:  13 systems were reviewed and are notable for fatigue.  All other review of systems are unremarkable.  Exam: . Filed Vitals:   04/06/12 1353  BP: 118/58  Pulse: 72  Weight: 144 lb (65.318 kg)    In general, well appearing women.    Cranial Nerves: Pupils are equally round and reactive to light.  Visual fields -> right inf nasal defect. Extraocular movements are intact without nystagmus. Facial sensation and muscles of mastication are intact. Muscles of facial expression are symmetric. Tongue protrusion, uvula, palate midline.  Shoulder shrug intact  Motor:  Normal bulk and tone, no drift and 5/5 muscle strength bilaterally.  Reflexes:  Quiet.  Coordination:  Normal finger to nose  Gait:  Mildly antalgic gait and station, seems to favor left leg.    Impression/Recommendations:  1.  Spells of loss of consciousness with bowel incontinence - I still do not know if these are seizures, but I am going to assume they are given a history of response to Keppra.  Her last event is concerning as it correlated with a low sodium.  Given that I wouldn't necessarily have increased the Keppra like I did to 750 bid.  However, for now I am going to hold it there.  However, I have concerns that perhaps the Keppra is causing fatigue, which is possible in someone older.  I have started her on a lamotrigine titration to 100 bid and have warned her of the risk of rash.  When she returns in 3 months, which is how long the titration is, we will decrease her Keppra, hopefully helping her fatigue. 2.  Headaches - now in remission;  likely related to her remote history of migraines.  For now I am not worried about them being from a malignant cause. 3.  Mild gait instability - clinical PN, PN labs unremarkable. 4.  Depression - this seems to be a pressing issue and may be as much a cause of her fatigue as the Keppra.  I would suggest the use of another anti-depressant if the sertraline was not effective.  The patient will follow up with my colleague Dr. Arbutus Leas as I will be taking an academic position at [employer].  Lupita Raider. Modesto Charon, MD [facility], [place]

## 2012-04-06 NOTE — Patient Instructions (Addendum)
Titration to Lamictal(generic name - lamotrigine) 100mg  twice a day using  Lamictal 25mg  tablets.   Morning Dose Evening Dose  Weeks 1-2  0 tablets  1 tablet (25mg )  Weeks 3-4 1 tablet (25mg ) 1 tablet (25mg )  Weeks 5-6 1 tablet (25mg ) 2 tablets (50mg )  Weeks 7-8 2 tablets (50mg ) 2 tablets (50mg )    Weeks 9-10 3 tablets (75mg ) 3 tablets (75mg )  Weeks 11-12 4 tablets (100mg ) 4 tablets (100mg )

## 2012-04-09 ENCOUNTER — Other Ambulatory Visit: Payer: PRIVATE HEALTH INSURANCE

## 2012-04-09 ENCOUNTER — Ambulatory Visit: Payer: PRIVATE HEALTH INSURANCE | Admitting: Oncology

## 2012-04-09 MED ORDER — LAMOTRIGINE 25 MG PO TABS: ORAL_TABLET | ORAL | Status: DC

## 2012-04-12 ENCOUNTER — Encounter: Payer: Self-pay | Admitting: Family

## 2012-04-12 ENCOUNTER — Ambulatory Visit (HOSPITAL_BASED_OUTPATIENT_CLINIC_OR_DEPARTMENT_OTHER): Payer: Medicare HMO | Admitting: Family

## 2012-04-12 ENCOUNTER — Ambulatory Visit: Payer: Self-pay | Admitting: Oncology

## 2012-04-12 ENCOUNTER — Other Ambulatory Visit: Payer: Self-pay | Admitting: Medical Oncology

## 2012-04-12 ENCOUNTER — Other Ambulatory Visit (HOSPITAL_BASED_OUTPATIENT_CLINIC_OR_DEPARTMENT_OTHER): Payer: Medicare HMO | Admitting: Lab

## 2012-04-12 VITALS — BP 117/61 | HR 73 | Temp 98.1°F | Resp 18 | Ht 62.0 in | Wt 144.5 lb

## 2012-04-12 DIAGNOSIS — D649 Anemia, unspecified: Secondary | ICD-10-CM

## 2012-04-12 DIAGNOSIS — D509 Iron deficiency anemia, unspecified: Secondary | ICD-10-CM

## 2012-04-12 LAB — COMPREHENSIVE METABOLIC PANEL
AST: 19 U/L (ref 0–37)
BUN: 11 mg/dL (ref 6–23)
Calcium: 9.1 mg/dL (ref 8.4–10.5)
Chloride: 100 mEq/L (ref 96–112)
Creatinine, Ser: 1.11 mg/dL — ABNORMAL HIGH (ref 0.50–1.10)
Total Bilirubin: 0.4 mg/dL (ref 0.3–1.2)

## 2012-04-12 LAB — IRON AND TIBC
%SAT: 27 % (ref 20–55)
TIBC: 250 ug/dL (ref 250–470)
UIBC: 182 ug/dL (ref 125–400)

## 2012-04-12 LAB — CBC WITH DIFFERENTIAL/PLATELET
BASO%: 0.2 % (ref 0.0–2.0)
EOS%: 2.7 % (ref 0.0–7.0)
HCT: 31.8 % — ABNORMAL LOW (ref 34.8–46.6)
LYMPH%: 37.6 % (ref 14.0–49.7)
MCH: 30.7 pg (ref 25.1–34.0)
MCHC: 34.3 g/dL (ref 31.5–36.0)
MCV: 89.6 fL (ref 79.5–101.0)
MONO#: 0.9 10*3/uL (ref 0.1–0.9)
MONO%: 10.8 % (ref 0.0–14.0)
NEUT%: 48.7 % (ref 38.4–76.8)
Platelets: 293 10*3/uL (ref 145–400)
RBC: 3.55 10*6/uL — ABNORMAL LOW (ref 3.70–5.45)

## 2012-04-12 NOTE — Progress Notes (Signed)
Patient ID: [name] [name], female   DOB: [birth year], 78 y.o.   MRN: [id number] CSN: [id number]  [id number] Modesto Charon , MD Marca Ancona, MD Antony Haste, MD  Problem List: [name] [name] is a 78 y.o. Caucasian female with a problem list consisting of:  1.  Anemia - borderline iron studie 2.  Decreased memory 3.  Fibromyalgia 4.  Dyslipidemia 5.  CAD - s/p angioplasty 6.  Vertigo 7.  Spinal stenosis 8.  HTN 9.  GERD 10. Depression 11. Seizures  [name] [name] presents to the office today for follow-up on her mild anemia in association with iron deficiency dating back to July 2009.  Her clinical condition seems to be stable.  The patient is here today with her sister [name] [name].  The patient receive 2000 mg of INFeD on March 21, 2008.  Her ferritin prior to that infusion was 14.  Her H/H has been stable.  The patient stated that she was hospitalized approximately 1 month ago for 3 days for a syncopal/possible seizure episode.  The patient stated that she passed out when she was on the commode and her son found her after she hit the floor.  EMS was called at that time.  When the patient lost consciousness she also became incontinent.  It usually takes the patient several weeks to recover from these episodes.  The patient's Keppra dose has been increased by Dr. Donna Bernard, Hospitalist and the patient was recently started on Lamictal by her Neurologist, Dr. Modesto Charon.  The patient is currently receiving home health nurse and PT visits.  The patient states that overall she is doing well but she has intermittent fatigue and weakness.  The patient was prescribed ferrous sulfate 325 mg PO BID by her PCP, Dr. Cyndia Bent.  The patient states that she also takes an OTC stool softener as not to become constipated while taking the iron.  The patient is without major complaint and denies any other symptomatology. Dr. Arline Asp also visited with the patient during today's office visit.   Past Medical History: Past Medical History    Diagnosis Date  . SOB (shortness of breath)   . Fatigue   . Anxiety   . Seizure   . Dizziness   . Hot flashes   . Ischemic heart disease   . Hypertension   . Hyperlipidemia   . Coronary artery disease     [name] [name]  . Anemia   . Syncope and collapse     RECURRENT, RELATED TO HYPONATREMIA, NARCOTICS, AND BRADYCARDIA  . Tobacco abuse     PAST  . Memory disorder   . Pneumonia   . Flu syndrome january 2013    Surgical History: Past Surgical History  Procedure Date  . Cardiac catheterization 2004  . Abdominal hysterectomy     early 1970's for fibroid    Current Medications: Current Outpatient Prescriptions  Medication Sig Dispense Refill  . acetaminophen (TYLENOL) 500 MG tablet Take 1,000 mg by mouth every 6 (six) hours as needed. For pain.      Marland Kitchen clopidogrel (PLAVIX) 75 MG tablet TAKE ONE (1) TABLET EACH DAY  30 tablet  4  . esomeprazole (NEXIUM) 40 MG capsule Take 40 mg by mouth daily before breakfast.        . ferrous sulfate 325 (65 FE) MG tablet Take 325 mg by mouth 2 (two) times daily.      . Fluticasone-Salmeterol (ADVAIR) 250-50 MCG/DOSE AEPB Inhale 1 puff into the lungs every 12 (twelve) hours.      Marland Kitchen  isosorbide mononitrate (IMDUR) 30 MG 24 hr tablet Take 15 mg by mouth daily.      Marland Kitchen lamoTRIgine (LAMICTAL) 25 MG tablet increase to 100 bid as instructed  240 tablet  3  . levETIRAcetam (KEPPRA) 500 MG tablet Take 1.5 tablets (750 mg total) by mouth every 12 (twelve) hours.  60 tablet  0  . Multiple Vitamin (MULTIVITAMIN WITH MINERALS) TABS Take 1 tablet by mouth daily.      . pravastatin (PRAVACHOL) 80 MG tablet Take 1 tablet (80 mg total) by mouth every evening.  30 tablet  6  . pregabalin (LYRICA) 75 MG capsule Take 75 mg by mouth at bedtime.       . sertraline (ZOLOFT) 100 MG tablet Take 50 mg by mouth daily.       Marland Kitchen tiotropium (SPIRIVA) 18 MCG inhalation capsule Place 18 mcg into inhaler and inhale daily.      . valsartan (DIOVAN) 40 MG tablet Take 1 tablet  (40 mg total) by mouth daily.  30 tablet  6    Allergies: Allergies  Allergen Reactions  . Codeine      Family History: Family History  Problem Relation Age of Onset  . Heart attack Father   . Heart failure Mother     Social History: History  Substance Use Topics  . Smoking status: Former Smoker -- 1.0 packs/day for 40 years    Quit date: 08/23/1999  . Smokeless tobacco: Never Used  . Alcohol Use: No    Review of Systems: 10 Point review of systems was completed and is negative except as noted above.   Physical Exam:   Blood pressure 117/61, pulse 73, temperature 98.1 F (36.7 C), temperature source Oral, resp. rate 18, height 5\' 2"  (1.575 m), weight 144 lb 8 oz (65.545 kg).  General appearance: Alert, cooperative, well nourished, no apparent distress Head: Normocephalic, without obvious abnormality, atraumatic Eyes: Conjunctivae/corneas clear, PERRLA, EOMI, no icterus Nose: Nares, septum and mucosa are normal, no drainage or sinus tenderness Neck: No adenopathy, supple, trachea midline, thyroid not enlarged,no tenderness Resp: Clear to auscultation bilaterally Cardio: Regular rate and rhythm, S1, S2 normal, no murmur, click, rub or gallop GI: Soft, non-tender, distended, hypoactive bowel sounds, no organomegaly Extremities: Extremities normal, atraumatic, no cyanosis or edema Lymph nodes: Cervical, supraclavicular, and axillary nodes normal Neurologic: Grossly normal   Laboratory Data: Results for orders placed in visit on 04/12/12 (from the past 48 hour(s))  CBC WITH DIFFERENTIAL     Status: Abnormal   Collection Time   04/12/12  1:47 PM      Component Value Range Comment   WBC 8.6  3.9 - 10.3 10e3/uL    NEUT# 4.2  1.5 - 6.5 10e3/uL    HGB 10.9 (*) 11.6 - 15.9 g/dL    HCT 16.1 (*) 09.6 - 46.6 %    Platelets 293  145 - 400 10e3/uL    MCV 89.6  79.5 - 101.0 fL    MCH 30.7  25.1 - 34.0 pg    MCHC 34.3  31.5 - 36.0 g/dL    RBC 0.45 (*) 4.09 - 5.45 10e6/uL     RDW 12.5  11.2 - 14.5 %    lymph# 3.2  0.9 - 3.3 10e3/uL    MONO# 0.9  0.1 - 0.9 10e3/uL    Eosinophils Absolute 0.2  0.0 - 0.5 10e3/uL    Basophils Absolute 0.0  0.0 - 0.1 10e3/uL    NEUT% 48.7  38.4 - 76.8 %  LYMPH% 37.6  14.0 - 49.7 %    MONO% 10.8  0.0 - 14.0 %    EOS% 2.7  0.0 - 7.0 %    BASO% 0.2  0.0 - 2.0 %     Imaging Studies: 1.  03/12/2012 CT of Head without Contrast  IMPRESSION: Atrophy and chronic microvascular ischemia. No acute abnormality. 2.  03/13/2012 Chest xray 2 view  IMPRESSION: Small left pleural effusion without evidence of heart failure or pneumonia.    Impression/Plan: Ms. Stiner continues to well from the standpoint of her previous iron-deficiency anemia.  Hemoglobin and hematocrit studies have remained stable.  Ferritin, Iron/TIBC and LDH are outstanding at this time.  We will continue to monitor her.  We will schedule an office visit and laboratories for the patient in 6 months' time.  We will check CBC, CMP, LDH, Iron/TIBC and Ferritin at that time.     Shanterria Franta NP-C 04/12/2012, 1:57 PM

## 2012-04-12 NOTE — Patient Instructions (Addendum)
Patient ID: [name] [name],   DOB: Oct 22, 1926,  MRN: [id number]   [name] Discharge Instructions  RECOMMENDATIONS MAD BY THE CONSULTANT AND ANY TEST RESULT(S) WILL BE FORWARDED TO YOU REFERRING DOCTOR   EXAM FINDINGS BY NURSE PRACTITIONER TODAY TO REPORT TO THE CLINIC OR PRIMARY PROVIDER:    Your Current Medications Are: Current Outpatient Prescriptions  Medication Sig Dispense Refill  . acetaminophen (TYLENOL) 500 MG tablet Take 1,000 mg by mouth every 6 (six) hours as needed. For pain.      Marland Kitchen clopidogrel (PLAVIX) 75 MG tablet TAKE ONE (1) TABLET EACH DAY  30 tablet  4  . esomeprazole (NEXIUM) 40 MG capsule Take 40 mg by mouth daily before breakfast.        . ferrous sulfate 325 (65 FE) MG tablet Take 325 mg by mouth 2 (two) times daily.      . Fluticasone-Salmeterol (ADVAIR) 250-50 MCG/DOSE AEPB Inhale 1 puff into the lungs every 12 (twelve) hours.      . isosorbide mononitrate (IMDUR) 30 MG 24 hr tablet Take 15 mg by mouth daily.      Marland Kitchen lamoTRIgine (LAMICTAL) 25 MG tablet increase to 100 bid as instructed  240 tablet  3  . levETIRAcetam (KEPPRA) 500 MG tablet Take 1.5 tablets (750 mg total) by mouth every 12 (twelve) hours.  60 tablet  0  . Multiple Vitamin (MULTIVITAMIN WITH MINERALS) TABS Take 1 tablet by mouth daily.      . pravastatin (PRAVACHOL) 80 MG tablet Take 1 tablet (80 mg total) by mouth every evening.  30 tablet  6  . pregabalin (LYRICA) 75 MG capsule Take 75 mg by mouth at bedtime.       . sertraline (ZOLOFT) 100 MG tablet Take 50 mg by mouth daily.       Marland Kitchen tiotropium (SPIRIVA) 18 MCG inhalation capsule Place 18 mcg into inhaler and inhale daily.      . valsartan (DIOVAN) 40 MG tablet Take 1 tablet (40 mg total) by mouth daily.  30 tablet  6     INSTRUCTIONS GIVEN, DISCUSSED AND FOLLOW-UP: Keep up the great work!  I acknowledge that I have been informed and understand all the instructions given to me and have received a copy.  I do not have any further  questions at this time, but I understand that I may call the [facility] at [phone] during business hours should I have any further questions or need assistance in obtaining follow-up care.   04/12/2012, 3:09 PM

## 2012-04-13 ENCOUNTER — Telehealth: Payer: Self-pay | Admitting: Oncology

## 2012-04-13 NOTE — Telephone Encounter (Signed)
lmonvm adviisng the pt of her feb 2014 appts °

## 2012-05-02 ENCOUNTER — Other Ambulatory Visit: Payer: Self-pay | Admitting: Cardiology

## 2012-05-03 ENCOUNTER — Telehealth: Payer: Self-pay | Admitting: Neurology

## 2012-05-03 NOTE — Telephone Encounter (Signed)
Called and spoke with the patient. She reports that the Lamictal was making her feel bad; dizzy and trouble with her balance and walking. She reports that she did not take her pill last night or this morning and she already feels better today. Was in the process of titrating up and was taking 25 mg bid. Is still taking the Keppra 750 BID. She just wants to know what Dr. Modesto Charon wants her to do now. Her pharmacy is [facility] on [address] if needed. **Dr. Modesto Charon, please advise.

## 2012-05-03 NOTE — Telephone Encounter (Signed)
Pt LM stating that she could no longer take the Lamictal. She has been on it for 2-3 weeks now and doesn't like the way it makes her feel. She did not leave specific symptoms that she has been having. She would like to know if there is anything else she can take with her Keppra? If so, can that rx be sent to [facility] on [address], [phone]?

## 2012-05-04 NOTE — Telephone Encounter (Signed)
Called the patient's mobile number and spoke with the patient's son, [name] [name]. Information given as per Dr. Modesto Charon below. He states he will pass the information on to his mom. Also stress that she needs to continue the Keppra as prescribed. Will f/u with Dr. Arbutus Leas.

## 2012-05-04 NOTE — Telephone Encounter (Signed)
Patient should D/C the Lamictal, no need to wean.  She can discuss another medication or just staying on the Keppra when she returns to see Dr. Arbutus Leas

## 2012-06-07 ENCOUNTER — Other Ambulatory Visit: Payer: Self-pay | Admitting: Cardiology

## 2012-06-11 ENCOUNTER — Other Ambulatory Visit: Payer: Self-pay | Admitting: Cardiology

## 2012-06-28 ENCOUNTER — Encounter: Payer: Self-pay | Admitting: Neurology

## 2012-06-28 ENCOUNTER — Ambulatory Visit (INDEPENDENT_AMBULATORY_CARE_PROVIDER_SITE_OTHER): Payer: Medicare HMO | Admitting: Neurology

## 2012-06-28 VITALS — BP 126/78 | HR 68 | Temp 97.8°F | Resp 18 | Wt 150.0 lb

## 2012-06-28 DIAGNOSIS — G3184 Mild cognitive impairment, so stated: Secondary | ICD-10-CM | POA: Insufficient documentation

## 2012-06-28 DIAGNOSIS — R569 Unspecified convulsions: Secondary | ICD-10-CM

## 2012-06-28 MED ORDER — LEVETIRACETAM 500 MG PO TABS: ORAL_TABLET | ORAL | Status: DC

## 2012-06-28 NOTE — Progress Notes (Signed)
[name] [name] was seen today in neurologic f/u.  The patient was previously seen by Dr. Modesto Charon but since he is no longer with the practice I am resuming the patient's care.  This patient is accompanied in the office by her child who supplements the history.   The patient is a 78 y.o. year old female with a history of possible seizure.  The spells are described by Dr. Modesto Charon as spells of loss of consciousness with associated bowel incontinence.  The patient reports that she generally has no warning prior to the event, will lose bladder and bowel control and will sometimes throw up.  Dr. Modesto Charon was not completely convinced that these were seizure, but she did respond to Keppra so he left her on the medication.  The most recent event was associated with hyponatremia.  She has not had an event since last visit.  At last visit, Dr. Modesto Charon did decrease her Keppra as he was concerned that it was causing fatigue and he added Lamictal.  Dr. Nash Dimmer goal was to go on to Lamictal monotherapy. However, the patient reports that she did not take the Lamictal more than 2 weeks because it "made her crazy" and feel "fuzzy headed."  The patient has previously had an MRI of the brain and an EEG, both of which were unremarkable for etiologies for seizure.  She also c/o walking like a drunk.  She also reports that she gets frustrated with memory and going into a room and not remembering why she goes there, but her son reports that memory is good.  She reports that years ago she was on the Exelon patch and that it did seem to help.  She also c/o "eating nonstop."  She admits thought that she has stopped doing things to keep her busy.  She was moved from an assisted-living type of situation in with her son and decreased her activities dramatically at that time.   PREVIOUS MEDICATIONS: Lamictal  ALLERGIES:   Allergies  Allergen Reactions  . Codeine     CURRENT MEDICATIONS:  Current Outpatient Prescriptions on File Prior to Visit    Medication Sig Dispense Refill  . acetaminophen (TYLENOL) 500 MG tablet Take 1,000 mg by mouth every 6 (six) hours as needed. For pain.      Marland Kitchen clopidogrel (PLAVIX) 75 MG tablet TAKE ONE (1) TABLET EACH DAY  30 tablet  2  . esomeprazole (NEXIUM) 40 MG capsule Take 40 mg by mouth daily before breakfast.        . ferrous sulfate 325 (65 FE) MG tablet Take 325 mg by mouth 2 (two) times daily.      . Fluticasone-Salmeterol (ADVAIR) 250-50 MCG/DOSE AEPB Inhale 1 puff into the lungs every 12 (twelve) hours.      . isosorbide mononitrate (IMDUR) 30 MG 24 hr tablet Take 15 mg by mouth daily.      . isosorbide mononitrate (IMDUR) 30 MG 24 hr tablet TAKE 1/2 TABLET (15MG ) BY MOUTH DAILY  30 tablet  1  . Multiple Vitamin (MULTIVITAMIN WITH MINERALS) TABS Take 1 tablet by mouth daily.      . pravastatin (PRAVACHOL) 80 MG tablet TAKE 1 TABLET EACH DAY  30 tablet  3  . pregabalin (LYRICA) 75 MG capsule Take 75 mg by mouth at bedtime.       . sertraline (ZOLOFT) 100 MG tablet Take 50 mg by mouth daily.       Marland Kitchen tiotropium (SPIRIVA) 18 MCG inhalation capsule Place 18 mcg  into inhaler and inhale daily.      . valsartan (DIOVAN) 40 MG tablet Take 1 tablet (40 mg total) by mouth daily.  30 tablet  6  . lamoTRIgine (LAMICTAL) 25 MG tablet increase to 100 bid as instructed  240 tablet  3    PAST MEDICAL HISTORY:   Past Medical History  Diagnosis Date  . SOB (shortness of breath)   . Fatigue   . Anxiety   . Seizure   . Dizziness   . Hot flashes   . Ischemic heart disease   . Hypertension   . Hyperlipidemia   . Coronary artery disease     [name]  . Anemia   . Syncope and collapse     RECURRENT, RELATED TO HYPONATREMIA, NARCOTICS, AND BRADYCARDIA  . Tobacco abuse     PAST  . Memory disorder   . Pneumonia   . Flu syndrome january 2013    PAST SURGICAL HISTORY:   Past Surgical History  Procedure Date  . Cardiac catheterization 2004  . Abdominal hysterectomy     early 1970's for fibroid     SOCIAL HISTORY:   History   Social History  . Marital Status: Widowed    Spouse Name: N/A    Number of Children: N/A  . Years of Education: N/A   Occupational History  . Not on file.   Social History Main Topics  . Smoking status: Former Smoker -- 1.0 packs/day for 40 years    Quit date: 08/23/1999  . Smokeless tobacco: Never Used  . Alcohol Use: No  . Drug Use: No  . Sexually Active: Not on file   Other Topics Concern  . Not on file   Social History Narrative   Housewife2 ChildrenWidowed    FAMILY HISTORY:   Family Status  Relation Status Death Age  . Father Deceased 48    CAD  . Mother Deceased 53    CHF  . Brother Deceased     cerebral hemmorhage  . Sister Deceased     3, CAD  . Child Alive     2, alive and well    ROS:  A complete 10 system review of systems was obtained and was unremarkable apart from what is mentioned above.  PHYSICAL EXAMINATION:    VITALS:   Filed Vitals:   06/28/12 1202  BP: 126/78  Pulse: 68  Temp: 97.8 F (36.6 C)  Resp: 18  Weight: 150 lb (68.04 kg)    GEN:  Normal appears female in no acute distress.  Appears stated age. HEENT:  Normocephalic, atraumatic. The mucous membranes are moist. The superficial temporal arteries are without ropiness or tenderness. Cardiovascular: Regular rate and rhythm. Lungs: Clear to auscultation bilaterally. Neck/Heme: There are no carotid bruits noted bilaterally.  NEUROLOGICAL: Orientation: A complete Mini-Mental Status Examination was performed today and the patient scored a 28/30. Cranial nerves: There is good facial symmetry. The pupils are equal round and reactive to light bilaterally. Fundoscopic exam is attempted but the disc margins are not well visualized bilaterally. Extraocular muscles are intact and visual fields are full to confrontational testing. Speech is fluent and clear. Soft palate rises symmetrically and there is no tongue deviation. Hearing is intact to  conversational tone. Tone: Tone is good throughout. Sensation: Sensation is intact to light touch and pinprick throughout (facial, trunk, extremities). Vibration is intact at the bilateral ankle. There is no extinction with double simultaneous stimulation. There is no sensory dermatomal level identified. Coordination:  The patient has no difficulty with RAM's or FNF bilaterally. Motor: Strength is 5/5 in the bilateral upper and lower extremities.  Shoulder shrug is equal and symmetric. There is no pronator drift.  There are no fasciculations noted. DTR's: Deep tendon reflexes are 0-1/4 at the bilateral biceps, triceps, brachioradialis, patella and achilles.  Plantar responses are downgoing bilaterally. Gait and Station: The patient is able to ambulate without difficulty.  She is wide-based.  LABS:  Lab Results  Component Value Date   VITAMINB12 406 01/11/2012   Lab Results  Component Value Date   TSH 2.732 03/12/2012   No results found for this basename: RPR   No results found for this basename: FOLATE   No results found for this basename: SPEP,  UPEP      IMPRESSION:  1. presumed seizure.  As above, the patient's events have never been well defined but they seem to have responded to.  Her most recent seizure, however, was associated with hyponatremia. 2.  Mild cognitive impairment.  PLAN:  1. I will continue the patient on Keppra, 750 mg twice a day.  Risks, benefits, side effects and alternative therapies were discussed.  The opportunity to ask questions was given and they were answered to the best of my ability.  The patient expressed understanding and willingness to follow the outlined treatment protocols. 2.  the patient asked me about the Exelon patch today, as she was apparently on in the past and thought it helped.  Because her MMSE was normal today, I told her that we would repeat in 6 months and if we saw a decline, then we would consider the addition of the Exelon patch.   She was able to provide her rather detailed medical history without any difficulty, and recall specifically the names of my staff members. 3.  I think that the biggest issue with the patient's memory is the fact that she has stopped going out socially and has stopped interacting with people her own age.  We talked about the importance of both physical and mental exercises.  We talked about various ideas.  Greater than 50% of the visit was spent in counseling. 4.  The patient was concerned about the fact that she is eating all the time.  She really has not gained virtually any way over the past year (she has gone up and down some).  Nonetheless, I think that she is eating because of poor done, and she completely agrees with this.  I think that if she gets some exercise and interacts with people her own age then the eating will cease. 5.  She does complain some about being off balance, which may be due to a mild peripheral neuropathy.  We did do some lab work.  We decided to hold off on EMG.  We did discuss safety. 6.  Return in about 6 months (around 12/26/2012). 7.  Time in room was 60 min.

## 2012-06-28 NOTE — Patient Instructions (Addendum)
1.  We will get labs today 2.  As long as you are seizure free, I don't need to see you for 6 months 3.  Keep your mind stimulated (friends, social activities) and exercise  Please go to [id number] [place] for your lab work. [employer] building). They are open Monday through Friday from 7:30 to 5:30.

## 2012-09-11 ENCOUNTER — Ambulatory Visit: Payer: Medicare HMO | Admitting: Oncology

## 2012-10-11 ENCOUNTER — Ambulatory Visit: Payer: Medicare HMO | Admitting: Oncology

## 2012-10-11 ENCOUNTER — Other Ambulatory Visit: Payer: Self-pay | Admitting: Medical Oncology

## 2012-10-11 ENCOUNTER — Telehealth: Payer: Self-pay | Admitting: Medical Oncology

## 2012-10-11 ENCOUNTER — Other Ambulatory Visit: Payer: Medicare HMO | Admitting: Lab

## 2012-10-11 NOTE — Telephone Encounter (Signed)
Pt's son Channing Mutters called and states that his mom is very sick with what he thinks is the flu. She will not be able to come today and I told him we will her rescheduled. Dr. Arline Asp notified and new POF made.

## 2012-10-14 ENCOUNTER — Telehealth: Payer: Self-pay | Admitting: Oncology

## 2012-10-14 NOTE — Telephone Encounter (Signed)
S/w pt re appt for 3/17. °

## 2012-10-24 ENCOUNTER — Other Ambulatory Visit (INDEPENDENT_AMBULATORY_CARE_PROVIDER_SITE_OTHER): Payer: Medicare HMO

## 2012-10-24 DIAGNOSIS — R569 Unspecified convulsions: Secondary | ICD-10-CM

## 2012-10-24 LAB — VITAMIN B12: Vitamin B-12: 470 pg/mL (ref 211–911)

## 2012-10-25 LAB — RPR

## 2012-10-29 ENCOUNTER — Other Ambulatory Visit: Payer: Self-pay | Admitting: Cardiology

## 2012-10-29 ENCOUNTER — Other Ambulatory Visit: Payer: Self-pay | Admitting: *Deleted

## 2012-10-29 LAB — PROTEIN ELECTROPHORESIS, SERUM
Albumin ELP: 59.6 % (ref 55.8–66.1)
Beta 2: 5.9 % (ref 3.2–6.5)
Total Protein, Serum Electrophoresis: 7.3 g/dL (ref 6.0–8.3)

## 2012-10-29 LAB — IMMUNOFIXATION ELECTROPHORESIS
IgA: 290 mg/dL (ref 69–380)
IgM, Serum: 12 mg/dL — ABNORMAL LOW (ref 52–322)

## 2012-10-29 LAB — UIFE/LIGHT CHAINS/TP QN, 24-HR UR
Albumin, U: DETECTED
Free Lambda Lt Chains,Ur: 0.11 mg/dL (ref 0.02–0.67)
Gamma Globulin, Urine: DETECTED — AB

## 2012-10-30 ENCOUNTER — Other Ambulatory Visit: Payer: Self-pay

## 2012-10-30 NOTE — Telephone Encounter (Signed)
Pt was unavaiable to talk but her son told me to call in lasix 20mg  one tab daily to [facility] - which i have done

## 2012-11-05 ENCOUNTER — Other Ambulatory Visit (HOSPITAL_BASED_OUTPATIENT_CLINIC_OR_DEPARTMENT_OTHER): Payer: Medicare HMO | Admitting: Lab

## 2012-11-05 ENCOUNTER — Encounter: Payer: Self-pay | Admitting: Oncology

## 2012-11-05 ENCOUNTER — Ambulatory Visit (HOSPITAL_BASED_OUTPATIENT_CLINIC_OR_DEPARTMENT_OTHER): Payer: Medicare HMO | Admitting: Oncology

## 2012-11-05 VITALS — BP 153/76 | HR 69 | Temp 97.3°F | Resp 18 | Ht 62.0 in | Wt 147.2 lb

## 2012-11-05 DIAGNOSIS — D649 Anemia, unspecified: Secondary | ICD-10-CM

## 2012-11-05 DIAGNOSIS — Z87891 Personal history of nicotine dependence: Secondary | ICD-10-CM

## 2012-11-05 DIAGNOSIS — D509 Iron deficiency anemia, unspecified: Secondary | ICD-10-CM

## 2012-11-05 LAB — COMPREHENSIVE METABOLIC PANEL (CC13)
ALT: 14 U/L (ref 0–55)
Alkaline Phosphatase: 64 U/L (ref 40–150)
CO2: 25 mEq/L (ref 22–29)
Creatinine: 1.4 mg/dL — ABNORMAL HIGH (ref 0.6–1.1)
Sodium: 138 mEq/L (ref 136–145)
Total Bilirubin: 0.35 mg/dL (ref 0.20–1.20)
Total Protein: 7.3 g/dL (ref 6.4–8.3)

## 2012-11-05 LAB — CBC WITH DIFFERENTIAL/PLATELET
Basophils Absolute: 0 10*3/uL (ref 0.0–0.1)
EOS%: 2.3 % (ref 0.0–7.0)
HCT: 33.7 % — ABNORMAL LOW (ref 34.8–46.6)
HGB: 11.6 g/dL (ref 11.6–15.9)
LYMPH%: 41.8 % (ref 14.0–49.7)
MCH: 31.8 pg (ref 25.1–34.0)
MCV: 92.2 fL (ref 79.5–101.0)
MONO%: 9.7 % (ref 0.0–14.0)
NEUT%: 46 % (ref 38.4–76.8)
Platelets: 297 10*3/uL (ref 145–400)

## 2012-11-05 LAB — IRON AND TIBC
%SAT: 24 % (ref 20–55)
TIBC: 275 ug/dL (ref 250–470)
UIBC: 210 ug/dL (ref 125–400)

## 2012-11-05 LAB — LACTATE DEHYDROGENASE (CC13): LDH: 143 U/L (ref 125–245)

## 2012-11-05 LAB — FERRITIN: Ferritin: 262 ng/mL (ref 10–291)

## 2012-11-05 NOTE — Progress Notes (Signed)
This office note has been dictated.  #[id number]

## 2012-11-06 NOTE — Progress Notes (Signed)
CC:   [name] Reas, MD Kerin Salen, MD, Fax [phone] Marca Ancona, MD   1. Iron deficiency anemia dating back to 02/18/2008 when the ferritin was 14 and hemoglobin was 11.6.  The patient received INFeD 2000 mg IV on 03/21/2008.  She underwent a GI workup in the past by Dr. Sharrell Ku.  Stools had been negative for occult blood.  2. Decreased memory  3. Fibromyalgia  4. Dyslipidemia  5. CAD - s/p angioplasty  6. Vertigo  7. Spinal stenosis  8. HTN  9. GERD  10. Depression  11. Seizures   MEDICATIONS:  Reviewed and recorded. Current Outpatient Prescriptions  Medication Sig Dispense Refill  . acetaminophen (TYLENOL) 500 MG tablet Take 1,000 mg by mouth every 6 (six) hours as needed. For pain.      . Calcium Carbonate-Vitamin D (CALCIUM 600 + D PO) Take by mouth.      . clopidogrel (PLAVIX) 75 MG tablet TAKE ONE (1) TABLET EACH DAY  30 tablet  2  . esomeprazole (NEXIUM) 40 MG capsule Take 40 mg by mouth daily before breakfast.        . Fluticasone-Salmeterol (ADVAIR) 250-50 MCG/DOSE AEPB Inhale 1 puff into the lungs every 12 (twelve) hours.      . furosemide (LASIX) 20 MG tablet TAKE 1 TABLET EACH DAY  30 tablet  2  . isosorbide mononitrate (IMDUR) 30 MG 24 hr tablet Take 15 mg by mouth daily.      Marland Kitchen levETIRAcetam (KEPPRA) 500 MG tablet 1 1/2 tablets bid  270 tablet  1  . Multiple Vitamin (MULTIVITAMIN WITH MINERALS) TABS Take 1 tablet by mouth daily.      . pravastatin (PRAVACHOL) 40 MG tablet Take 40 mg by mouth daily.      . pregabalin (LYRICA) 75 MG capsule Take 75 mg by mouth at bedtime.       . sertraline (ZOLOFT) 100 MG tablet Take 50 mg by mouth daily.       Marland Kitchen tiotropium (SPIRIVA) 18 MCG inhalation capsule Place 18 mcg into inhaler and inhale daily.      . valsartan (DIOVAN) 40 MG tablet Take 1 tablet (40 mg total) by mouth daily.  30 tablet  6   No current facility-administered medications for this visit.    SMOKING HISTORY:  The patient smoked a pack of  cigarettes a day for 40 years.  She stopped smoking in 1990.   HISTORY:  [name] [name] was seen today for followup of her history of iron deficiency anemia.  The patient was last seen by [place] on 04/12/2012 and prior to that on 10/13/2011 and 02/21/2011.  The patient has not required any additional IV iron since 03/21/2008.  She lives with one of her sons in an apartment here in [place].  She no longer drives.  She required admission to the hospital back in July 2013 because of a seizure.  Her Keppra dose was increased and she has done well since that time.  The patient denies any obvious blood in her stools.  Stools are brown.  I see where in the past she apparently had been on some iron but is not on iron now.  She does chew ice but does not think this is anything unusual.  She is here today with her sister, [name] [name].  Her main complaint is of dizziness and having no energy.  She says she is dizzy all the time, also lightheaded.  She says that things spin.  I have suggested  that she see her neurologist, Dr. Arbutus Leas.  Prior to that she saw Dr. Denton Meek.  PHYSICAL EXAMINATION:  The patient is a pleasant 79 year old woman, somewhat frail.  She uses a cane in her right hand.  She denies falling. Weight is 147 pounds 3.2 ounces.  Height 5 feet 2 inches.  Body surface area 1.71 sq m.  Blood pressure today 153/76.  Other vital signs are normal.  There is no scleral icterus.  Mouth and pharynx are benign. There is no peripheral adenopathy palpable.  Heart and lungs are normal. Abdomen is benign with no organomegaly or masses palpable.  Extremities, no peripheral edema.  Neurologic exam was nonfocal.  LABORATORY DATA:  Today, white count 8.4, ANC 3.9, hemoglobin 11.6, hematocrit 32.7, platelets 297,000.  Chemistries today were normal except for a BUN of 12, creatinine 1.4.  Albumin was 4.0, LDH 143.  Iron studies today are pending.  Iron studies from 04/12/2012 notable for a ferritin of 348.   Iron saturation 27%.  On 10/24/2012 vitamin B12 level was 470 and serum folate 18.0.  We also have a serum protein electrophoresis and quantitative immunoglobulins from 10/24/2012.  There was no monoclonal protein detected.  IgG and IgA levels were normal. IgM level was 12 with normal being 52-322.  Serum immunofixation electrophoresis showed no monoclonal protein.   IMAGING STUDIES: 1. Chest x-ray, 2 view, from 05/31/2010 showed findings suggestive of     a nondisplaced healing callus posterolateral right 8th rib.  There     was chronic atelectasis and/or scarring of the right middle lobe     dating back to 04/25/2004.  There was stable borderline emphysema     and a small hiatal hernia.  There was no active cardiopulmonary     disease. 2. CT scan of the head without IV contrast from 03/12/2012 showed atrophy and       chronic microvascular ischemia.  There was no acute abnormality. 3. Chest x-ray, 2 view, from 03/13/2012 showed a small left pleural     effusion without evidence of heart failure or pneumonia.   ASSESSMENT AND PLAN:  Ms. [name] seems to be doing well from a hematologic standpoint.  We are awaiting her iron studies today.  Her hemoglobin and hematocrit seem to be stable.  Her last ferritin on 04/12/2012 was 348. The patient was advised to discuss her dizziness with Dr. Cyndia Bent or with Dr. Arbutus Leas.  We will plan to see the patient again in 6 months, at which time we will check CBC, chemistries and iron studies.    ______________________________ Samul Dada, M.D. DSM/MEDQ  D:  11/05/2012  T:  11/05/2012  Job:  [id number]

## 2012-11-19 ENCOUNTER — Telehealth: Payer: Self-pay | Admitting: Oncology

## 2012-11-19 NOTE — Telephone Encounter (Signed)
S/w pt re appt for 9/15.

## 2012-12-18 ENCOUNTER — Encounter: Payer: Self-pay | Admitting: Cardiology

## 2013-05-03 ENCOUNTER — Other Ambulatory Visit: Payer: Self-pay

## 2013-05-03 DIAGNOSIS — D649 Anemia, unspecified: Secondary | ICD-10-CM

## 2013-05-06 ENCOUNTER — Encounter: Payer: Self-pay | Admitting: Internal Medicine

## 2013-05-06 ENCOUNTER — Ambulatory Visit (HOSPITAL_BASED_OUTPATIENT_CLINIC_OR_DEPARTMENT_OTHER): Payer: Medicare HMO | Admitting: Internal Medicine

## 2013-05-06 ENCOUNTER — Other Ambulatory Visit (HOSPITAL_BASED_OUTPATIENT_CLINIC_OR_DEPARTMENT_OTHER): Payer: Medicare HMO | Admitting: Lab

## 2013-05-06 ENCOUNTER — Telehealth: Payer: Self-pay | Admitting: Internal Medicine

## 2013-05-06 VITALS — BP 125/68 | HR 66 | Temp 98.2°F | Resp 18 | Ht 62.0 in | Wt 128.0 lb

## 2013-05-06 DIAGNOSIS — D649 Anemia, unspecified: Secondary | ICD-10-CM

## 2013-05-06 LAB — COMPREHENSIVE METABOLIC PANEL (CC13)
Albumin: 3.9 g/dL (ref 3.5–5.0)
Alkaline Phosphatase: 58 U/L (ref 40–150)
CO2: 25 mEq/L (ref 22–29)
Calcium: 9.6 mg/dL (ref 8.4–10.4)
Chloride: 104 mEq/L (ref 98–109)
Glucose: 91 mg/dl (ref 70–140)
Potassium: 4.3 mEq/L (ref 3.5–5.1)
Sodium: 139 mEq/L (ref 136–145)
Total Protein: 7 g/dL (ref 6.4–8.3)

## 2013-05-06 LAB — CBC WITH DIFFERENTIAL/PLATELET
Eosinophils Absolute: 0.1 10*3/uL (ref 0.0–0.5)
HGB: 11.1 g/dL — ABNORMAL LOW (ref 11.6–15.9)
MONO#: 0.7 10*3/uL (ref 0.1–0.9)
MONO%: 8.4 % (ref 0.0–14.0)
NEUT#: 3.9 10*3/uL (ref 1.5–6.5)
RBC: 3.66 10*6/uL — ABNORMAL LOW (ref 3.70–5.45)
RDW: 13.7 % (ref 11.2–14.5)
WBC: 8.5 10*3/uL (ref 3.9–10.3)
lymph#: 3.7 10*3/uL — ABNORMAL HIGH (ref 0.9–3.3)

## 2013-05-06 LAB — LACTATE DEHYDROGENASE (CC13): LDH: 145 U/L (ref 125–245)

## 2013-05-06 LAB — IRON AND TIBC CHCC: TIBC: 226 ug/dL — ABNORMAL LOW (ref 236–444)

## 2013-05-06 NOTE — Telephone Encounter (Signed)
gv pt appt schedule for March 2015.  °

## 2013-05-07 NOTE — Progress Notes (Signed)
CC:   [name] Reas, MD [name] Salen, MD, Fax [phone] [name] Ancona, MD   1. Iron deficiency anemia dating back to 02/18/2008 when the ferritin was 14 and hemoglobin was 11.6.  The patient received INFeD 2000 mg IV on 03/21/2008.  She underwent a GI workup in the past by Dr. Sharrell [name].  Stools had been negative for occult blood. 2. Decreased memory  3. Fibromyalgia  4. Dyslipidemia  5. CAD - s/p angioplasty  6. Vertigo  7. Spinal stenosis  8. HTN  9. GERD  10. Depression  11. Seizures  MEDICATIONS:  Reviewed and recorded. Current Outpatient Prescriptions  Medication Sig Dispense Refill  . acetaminophen (TYLENOL) 500 MG tablet Take 1,000 mg by mouth every 6 (six) hours as needed. For pain.      . Calcium Carbonate-Vitamin D (CALCIUM 600 + D PO) Take by mouth.      . clopidogrel (PLAVIX) 75 MG tablet TAKE ONE (1) TABLET EACH DAY  30 tablet  2  . esomeprazole (NEXIUM) 40 MG capsule Take 40 mg by mouth daily before breakfast.        . Fluticasone-Salmeterol (ADVAIR) 250-50 MCG/DOSE AEPB Inhale 1 puff into the lungs every 12 (twelve) hours.      . furosemide (LASIX) 20 MG tablet TAKE 1 TABLET EACH DAY  30 tablet  2  . isosorbide mononitrate (IMDUR) 30 MG 24 hr tablet Take 15 mg by mouth daily.      Marland Kitchen levETIRAcetam (KEPPRA) 500 MG tablet 1 1/2 tablets bid  270 tablet  1  . Multiple Vitamin (MULTIVITAMIN WITH MINERALS) TABS Take 1 tablet by mouth daily.      . pravastatin (PRAVACHOL) 40 MG tablet Take 40 mg by mouth daily.      . pregabalin (LYRICA) 75 MG capsule Take 75 mg by mouth at bedtime.       . sertraline (ZOLOFT) 100 MG tablet Take 50 mg by mouth daily.       Marland Kitchen tiotropium (SPIRIVA) 18 MCG inhalation capsule Place 18 mcg into inhaler and inhale daily.      . valsartan (DIOVAN) 40 MG tablet Take 1 tablet (40 mg total) by mouth daily.  30 tablet  6   No current facility-administered medications for this visit.    SMOKING HISTORY:  The patient smoked a pack of  cigarettes a day for 40 years.  She stopped smoking in 1990.   HISTORY:  [name] [name] was seen today for followup of her history of iron deficiency anemia.  The patient was last seen by Dr. Cherie [name] on 11/01/2012.The patient has not required any additional IV iron since 03/21/2008.  She lives with one of her sons in an apartment here in [place].  The patient denies any obvious blood in her stools.  Stools are brown.  She does chew ice but does not think this is anything unusual.  She is here today with her sister, [name] [name].    PHYSICAL EXAMINATION:  ECOG: 2 BP 125/68  Pulse 66  Temp(Src) 98.2 F (36.8 C) (Oral)  Resp 18  Ht 5\' 2"  (1.575 m)  Wt 128 lb (58.06 kg)  BMI 23.41 kg/m2  SpO2 98% General: Pleasant 79 year old woman,somewhat frail.  She uses a cane in her right hand.  She denies falling. HEENT:   There is no scleral icterus.  Mouth and pharynx are benign. Lymph nodes: There is no peripheral adenopathy palpable.   Heart: RR;S1S2 present; No M/R/G. Lungs: CTAB/L; No wheezes or rhonchi. Abdomen: NT/ND/+  BS Extremities: No peripheral edema. Neurology: Nonfocal exam   LABORATORY DATA:  CBC    Component Value Date/Time   WBC 8.5 05/06/2013 1347   WBC 8.7 03/14/2012 0453   RBC 3.66* 05/06/2013 1347   RBC 3.41* 03/14/2012 0453   HGB 11.1* 05/06/2013 1347   HGB 10.7* 03/14/2012 0453   HCT 33.4* 05/06/2013 1347   HCT 30.5* 03/14/2012 0453   PLT 304 05/06/2013 1347   PLT 338 03/14/2012 0453   MCV 91.4 05/06/2013 1347   MCV 89.4 03/14/2012 0453   MCH 30.4 05/06/2013 1347   MCH 31.4 03/14/2012 0453   MCHC 33.3 05/06/2013 1347   MCHC 35.1 03/14/2012 0453   RDW 13.7 05/06/2013 1347   RDW 12.3 03/14/2012 0453   LYMPHSABS 3.7* 05/06/2013 1347   LYMPHSABS 2.5 03/13/2012 0507   MONOABS 0.7 05/06/2013 1347   MONOABS 1.1* 03/13/2012 0507   EOSABS 0.1 05/06/2013 1347   EOSABS 0.2 03/13/2012 0507   BASOSABS 0.0 05/06/2013 1347   BASOSABS 0.0 03/13/2012 0507    CMP     Component Value Date/Time   NA  139 05/06/2013 1348   NA 131* 04/12/2012 1347   K 4.3 05/06/2013 1348   K 4.2 04/12/2012 1347   CL 104 11/05/2012 1411   CL 100 04/12/2012 1347   CO2 25 05/06/2013 1348   CO2 23 04/12/2012 1347   GLUCOSE 91 05/06/2013 1348   GLUCOSE 94 11/05/2012 1411   GLUCOSE 126* 04/12/2012 1347   BUN 13.3 05/06/2013 1348   BUN 11 04/12/2012 1347   CREATININE 1.2* 05/06/2013 1348   CREATININE 1.11* 04/12/2012 1347   CALCIUM 9.6 05/06/2013 1348   CALCIUM 9.1 04/12/2012 1347   PROT 7.0 05/06/2013 1348   PROT 6.7 04/12/2012 1347   ALBUMIN 3.9 05/06/2013 1348   ALBUMIN 4.2 04/12/2012 1347   AST 19 05/06/2013 1348   AST 19 04/12/2012 1347   ALT 9 05/06/2013 1348   ALT 11 04/12/2012 1347   ALKPHOS 58 05/06/2013 1348   ALKPHOS 50 04/12/2012 1347   BILITOT 0.27 05/06/2013 1348   BILITOT 0.4 04/12/2012 1347   GFRNONAA 49* 03/15/2012 0508   GFRAA 57* 03/15/2012 0508   Iron/TIBC/Ferritin    Component Value Date/Time   IRON 83 05/06/2013 1347   IRON 65 11/05/2012 1411   TIBC 226* 05/06/2013 1347   TIBC 275 11/05/2012 1411   FERRITIN 276* 05/06/2013 1347   FERRITIN 262 11/05/2012 1411    IMAGING STUDIES: 1. Chest x-ray, 2 view, from 05/31/2010 showed findings suggestive of     a nondisplaced healing callus posterolateral right 8th rib.  There     was chronic atelectasis and/or scarring of the right middle lobe     dating back to 04/25/2004.  There was stable borderline emphysema     and a small hiatal hernia.  There was no active cardiopulmonary     disease. 2. CT scan of the head without IV contrast from 03/12/2012 showed atrophy and       chronic microvascular ischemia.  There was no acute abnormality. 3. Chest x-ray, 2 view, from 03/13/2012 showed a small left pleural  effusion without evidence of heart failure or pneumonia.   ASSESSMENT AND PLAN:  1. Iron-deficiency Anemia.  -- [name] [name] seems to be doing well from a hematologic standpoint.  Her iron studies are WNL.  Her hemoglobin and hematocrit remains stable.  Her  last ferritin on 04/12/2012 was 276. --We will plan to see the patient again in  6 months, at which time we will check CBC, chemistries and iron studies. ______________________________ Jaclyn Prime Hayslee Casebolt, MD, MS 05/06/13  10:00pm

## 2013-05-13 ENCOUNTER — Encounter: Payer: Self-pay | Admitting: *Deleted

## 2013-05-15 ENCOUNTER — Telehealth (HOSPITAL_COMMUNITY): Payer: Self-pay | Admitting: Interventional Radiology

## 2013-05-15 ENCOUNTER — Other Ambulatory Visit (HOSPITAL_COMMUNITY): Payer: Self-pay | Admitting: Interventional Radiology

## 2013-05-15 DIAGNOSIS — M549 Dorsalgia, unspecified: Secondary | ICD-10-CM

## 2013-05-15 DIAGNOSIS — IMO0002 Reserved for concepts with insufficient information to code with codable children: Secondary | ICD-10-CM

## 2013-05-20 ENCOUNTER — Ambulatory Visit: Payer: Medicare HMO | Admitting: Cardiology

## 2013-05-21 ENCOUNTER — Other Ambulatory Visit: Payer: Self-pay | Admitting: Cardiology

## 2013-05-21 ENCOUNTER — Other Ambulatory Visit (HOSPITAL_COMMUNITY): Payer: Self-pay | Admitting: Interventional Radiology

## 2013-05-21 ENCOUNTER — Other Ambulatory Visit (HOSPITAL_COMMUNITY): Payer: Self-pay | Admitting: Physical Medicine and Rehabilitation

## 2013-05-21 DIAGNOSIS — IMO0002 Reserved for concepts with insufficient information to code with codable children: Secondary | ICD-10-CM

## 2013-05-21 DIAGNOSIS — M549 Dorsalgia, unspecified: Secondary | ICD-10-CM

## 2013-05-22 ENCOUNTER — Ambulatory Visit (HOSPITAL_COMMUNITY)
Admission: RE | Admit: 2013-05-22 | Discharge: 2013-05-22 | Disposition: A | Discharge status: Home / Self Care | Payer: Medicare HMO | Source: Ambulatory Visit | Attending: Physical Medicine and Rehabilitation | Admitting: Physical Medicine and Rehabilitation

## 2013-05-22 ENCOUNTER — Other Ambulatory Visit (HOSPITAL_COMMUNITY): Payer: Self-pay | Admitting: Interventional Radiology

## 2013-05-22 DIAGNOSIS — IMO0002 Reserved for concepts with insufficient information to code with codable children: Secondary | ICD-10-CM

## 2013-05-22 DIAGNOSIS — M549 Dorsalgia, unspecified: Secondary | ICD-10-CM

## 2013-05-23 ENCOUNTER — Other Ambulatory Visit: Payer: Self-pay | Admitting: Radiology

## 2013-05-28 ENCOUNTER — Ambulatory Visit (HOSPITAL_COMMUNITY)
Admission: RE | Admit: 2013-05-28 | Discharge: 2013-05-28 | Disposition: A | Discharge status: Home / Self Care | Payer: Medicare HMO | Source: Ambulatory Visit | Attending: Interventional Radiology | Admitting: Interventional Radiology

## 2013-05-28 ENCOUNTER — Encounter (HOSPITAL_COMMUNITY): Payer: Self-pay

## 2013-05-28 DIAGNOSIS — I10 Essential (primary) hypertension: Secondary | ICD-10-CM | POA: Insufficient documentation

## 2013-05-28 DIAGNOSIS — W010XXA Fall on same level from slipping, tripping and stumbling without subsequent striking against object, initial encounter: Secondary | ICD-10-CM | POA: Insufficient documentation

## 2013-05-28 DIAGNOSIS — S22009A Unspecified fracture of unspecified thoracic vertebra, initial encounter for closed fracture: Secondary | ICD-10-CM | POA: Insufficient documentation

## 2013-05-28 DIAGNOSIS — M549 Dorsalgia, unspecified: Secondary | ICD-10-CM

## 2013-05-28 DIAGNOSIS — IMO0002 Reserved for concepts with insufficient information to code with codable children: Secondary | ICD-10-CM

## 2013-05-28 LAB — CBC
Hemoglobin: 11 g/dL — ABNORMAL LOW (ref 12.0–15.0)
MCH: 31 pg (ref 26.0–34.0)
MCV: 90.7 fL (ref 78.0–100.0)
Platelets: 274 10*3/uL (ref 150–400)
RBC: 3.55 MIL/uL — ABNORMAL LOW (ref 3.87–5.11)
RDW: 12.9 % (ref 11.5–15.5)
WBC: 6.2 10*3/uL (ref 4.0–10.5)

## 2013-05-28 LAB — PROTIME-INR
INR: 1.07 (ref 0.00–1.49)
Prothrombin Time: 13.7 seconds (ref 11.6–15.2)

## 2013-05-28 LAB — BASIC METABOLIC PANEL
CO2: 25 mEq/L (ref 19–32)
Calcium: 9.3 mg/dL (ref 8.4–10.5)
GFR calc non Af Amer: 44 mL/min — ABNORMAL LOW (ref >90)
Glucose, Bld: 88 mg/dL (ref 70–99)
Potassium: 4 mEq/L (ref 3.5–5.1)
Sodium: 137 mEq/L (ref 135–145)

## 2013-05-28 LAB — APTT: aPTT: 28 seconds (ref 24–37)

## 2013-05-28 MED ORDER — IOHEXOL 300 MG/ML  SOLN
50.0000 mL | Freq: Once | INTRAMUSCULAR | Status: AC | PRN
  Administered 2013-05-28: 1 mL via INTRAVENOUS

## 2013-05-28 MED ORDER — SODIUM CHLORIDE 0.9 % IV SOLN
Freq: Once | INTRAVENOUS | Status: AC
  Administered 2013-05-28: 75 mL/h via INTRAVENOUS

## 2013-05-28 MED ORDER — FENTANYL CITRATE 0.05 MG/ML IJ SOLN
INTRAMUSCULAR | Status: AC
  Filled 2013-05-28: qty 4

## 2013-05-28 MED ORDER — HYDROMORPHONE HCL PF 1 MG/ML IJ SOLN
INTRAMUSCULAR | Status: AC
  Filled 2013-05-28: qty 2

## 2013-05-28 MED ORDER — TOBRAMYCIN SULFATE 1.2 G IJ SOLR
INTRAMUSCULAR | Status: AC
  Filled 2013-05-28: qty 1.2

## 2013-05-28 MED ORDER — SODIUM CHLORIDE 0.9 % IV SOLN: INTRAVENOUS | Status: AC

## 2013-05-28 MED ORDER — CEFAZOLIN SODIUM-DEXTROSE 2-3 GM-% IV SOLR
2.0000 g | Freq: Once | INTRAVENOUS | Status: AC
  Administered 2013-05-28: 2 g via INTRAVENOUS
  Filled 2013-05-28: qty 50

## 2013-05-28 MED ORDER — MIDAZOLAM HCL 2 MG/2ML IJ SOLN
INTRAMUSCULAR | Status: AC | PRN
  Administered 2013-05-28 (×2): 1 mg via INTRAVENOUS
  Administered 2013-05-28: 0.5 mg via INTRAVENOUS

## 2013-05-28 MED ORDER — FENTANYL CITRATE 0.05 MG/ML IJ SOLN
INTRAMUSCULAR | Status: AC | PRN
  Administered 2013-05-28: 12.5 ug via INTRAVENOUS
  Administered 2013-05-28 (×2): 25 ug via INTRAVENOUS

## 2013-05-28 MED ORDER — MIDAZOLAM HCL 2 MG/2ML IJ SOLN
INTRAMUSCULAR | Status: AC
  Filled 2013-05-28: qty 6

## 2013-05-28 NOTE — Procedures (Signed)
S/P T 11 KP with biopsy 

## 2013-05-28 NOTE — H&P (Signed)
[name] [name] is an 79 y.o. female.   Chief Complaint: fell at home in May--tripped  Continued back pain MRI revealed new Thoracic 11 fracture Consulted with Dr Corliss Skains 05/22/2013. Scheduled now for T 11 vertebroplasty vs kyphoplasty HPI: Sz; CAD/stent- on Plavix (off x 5 days); HTN; HLD  Past Medical History  Diagnosis Date  . SOB (shortness of breath)   . Fatigue   . Anxiety   . Seizure   . Dizziness   . Hot flashes   . Ischemic heart disease   . Hypertension   . Hyperlipidemia   . Coronary artery disease     [name] [name]  . Anemia   . Syncope and collapse     RECURRENT, RELATED TO HYPONATREMIA, NARCOTICS, AND BRADYCARDIA  . Tobacco abuse     PAST  . Memory disorder   . Pneumonia   . Flu syndrome january 2013    Past Surgical History  Procedure Laterality Date  . Cardiac catheterization  2004  . Abdominal hysterectomy      early 1970's for fibroid    Family History  Problem Relation Age of Onset  . Heart attack Father   . Heart failure Mother   . CAD Father   . Congestive Heart Failure Mother   . Other Brother     cerebral hemmorhage  . Coronary artery disease Sister    Social History:  reports that she quit smoking about 13 years ago. She has never used smokeless tobacco. She reports that she does not drink alcohol or use illicit drugs.  Allergies:  Allergies  Allergen Reactions  . Codeine Nausea And Vomiting     (Not in [name] hospital admission)  No results found for this or any previous visit (from the past 48 hour(s)). No results found.  Review of Systems  Constitutional: Negative for fever and chills.  Respiratory: Negative for shortness of breath.   Cardiovascular: Negative for chest pain.  Gastrointestinal: Negative for nausea and vomiting.  Musculoskeletal: Positive for back pain.  Neurological: Positive for weakness. Negative for dizziness and headaches.    Blood pressure 119/56, pulse 52, temperature 97.9 F (36.6 C), temperature  source Oral, resp. rate 18, height 5\' 2"  (1.575 m), weight 137 lb (62.143 kg), SpO2 96.00%. Physical Exam  Constitutional: She is oriented to person, place, and time.  thin  Cardiovascular: Normal rate, regular rhythm and normal heart sounds.   No murmur heard. Respiratory: Effort normal and breath sounds normal. She has no wheezes.  GI: Soft. Bowel sounds are normal. There is no tenderness.  Musculoskeletal: Normal range of motion.  Gait guarded  Neurological: She is alert and oriented to person, place, and time. Coordination normal.  Skin: Skin is warm and dry.  Psychiatric: She has [name] normal mood and affect. Her behavior is normal. Judgment and thought content normal.     Assessment/Plan Fall at home in May; + back pain New T11 fx on MRI Now scheduled for T 11 VP/Kp Pt and family aware of procedure benefits and risks and agreeable to proceed Consent signed and in chart  [name] [name] 05/28/2013, 9:28 AM

## 2013-06-04 ENCOUNTER — Encounter: Payer: Self-pay | Admitting: Cardiology

## 2013-06-04 ENCOUNTER — Ambulatory Visit (INDEPENDENT_AMBULATORY_CARE_PROVIDER_SITE_OTHER): Payer: Medicare HMO | Admitting: Cardiology

## 2013-06-04 VITALS — BP 124/58 | HR 61 | Ht 62.0 in | Wt 128.0 lb

## 2013-06-04 DIAGNOSIS — I10 Essential (primary) hypertension: Secondary | ICD-10-CM

## 2013-06-04 DIAGNOSIS — I2510 Atherosclerotic heart disease of native coronary artery without angina pectoris: Secondary | ICD-10-CM

## 2013-06-04 DIAGNOSIS — E785 Hyperlipidemia, unspecified: Secondary | ICD-10-CM

## 2013-06-04 NOTE — Progress Notes (Signed)
Patient ID: [name] [name], female   DOB: 16-Nov-1926, 79 y.o.   MRN: [id number] PCP: Dr. Cyndia Bent  79 yo with history of CAD and seizure disorder presents for cardiology followup.  She has chronic diastolic CHF and is on a low dose of Lasix.  She has not been doing well since 01-11-2023 when her son died of cancer.  Mood has been depressed.  She has fallen several times (mechanical falls, no lightheadedness/syncope).  Usually she falls when she has bent over to try to pick up her dog.  She uses a cane and walker.  She had vertebroplasty recently, which has helped her back pain.  She is not very active but denies exertional dyspnea or chest pain.  Main problem has been back pain.   Labs (9/12): K 5.5, creatinine 1.3, TSH mildly elevated  Labs (10/12); BNP 277 Labs (1/13): LDL 90, HDL 61, K 5, creatinine 1.2 Labs (2/13): K 4.8, creatinine 1.13, BNP 97, HCT 31.9 Labs (10/14): K 4, creatinine 1.12  ECG: NSR, normal  PMH: 1.  Seizure disorder 2.  CAD: Left heart cath in 2004 showed 70% ostial LAD stenosis and 50-60% proximal ramus disease.  It was decided to manage her medically.  Myoview (5/11) showed EF 74% and was a normal study.   3. Chronic diastolic CHF: Echo (6/08) showed EF 55-60% with grade I diastolic dysfunction, mild LAE.  4. HTN 5. Hyperlipidemia 6. GERD 7. Chronic Fe deficiency anemia. 8. H/o hyponatremia 9. Memory difficulty  SH: Lives in [place], 1 son lives with her, other son died of cancer.  Prior smoker.   FH: Parents with CAD  Current Outpatient Prescriptions  Medication Sig Dispense Refill  . acetaminophen (TYLENOL) 500 MG tablet Take 1,000 mg by mouth every 6 (six) hours as needed. For pain.      . Calcium Carbonate-Vitamin D (CALCIUM 600 + D PO) Take 1 tablet by mouth daily.       . clopidogrel (PLAVIX) 75 MG tablet Take 75 mg by mouth daily.      Marland Kitchen esomeprazole (NEXIUM) 40 MG capsule Take 40 mg by mouth daily before breakfast.        . Fluticasone-Salmeterol (ADVAIR)  250-50 MCG/DOSE AEPB Inhale 1 puff into the lungs every 12 (twelve) hours.      . furosemide (LASIX) 20 MG tablet take 1 tablet by mouth once daily  30 tablet  0  . HYDROcodone-acetaminophen (NORCO/VICODIN) 5-325 MG per tablet Take 1 tablet by mouth every 6 (six) hours as needed for pain. 5-300 MG      . isosorbide mononitrate (IMDUR) 30 MG 24 hr tablet Take 30 mg by mouth daily.       Marland Kitchen levETIRAcetam (KEPPRA) 500 MG tablet Take 750 mg by mouth 2 (two) times daily. 1 1/2 tablets bid      . Multiple Vitamin (MULTIVITAMIN WITH MINERALS) TABS Take 1 tablet by mouth daily.      . nitroGLYCERIN (NITROSTAT) 0.4 MG SL tablet Place 0.4 mg under the tongue every 5 (five) minutes as needed for chest pain.      . pravastatin (PRAVACHOL) 40 MG tablet Take 40 mg by mouth daily.      . sertraline (ZOLOFT) 100 MG tablet Take 100 mg by mouth daily.       Marland Kitchen tiotropium (SPIRIVA) 18 MCG inhalation capsule Place 18 mcg into inhaler and inhale daily.      . valsartan (DIOVAN) 40 MG tablet Take 160 mg by mouth daily.      Marland Kitchen  pregabalin (LYRICA) 75 MG capsule Take 75 mg by mouth at bedtime.        No current facility-administered medications for this visit.    BP 124/58  Pulse 61  Ht 5\' 2"  (1.575 m)  Wt 58.06 kg (128 lb)  BMI 23.41 kg/m2 General: NAD, elderly Neck: No JVD, no thyromegaly or thyroid nodule.  Lungs: Clear to auscultation bilaterally with normal respiratory effort. CV: Nondisplaced PMI.  Heart regular S1/S2, no S3/S4, 1/6 early SEM.  No peripheral edema.  No carotid bruit.  Normal pedal pulses.  Abdomen: Soft, nontender, no hepatosplenomegaly, no distention.  Neurologic: Alert and oriented x 3.  Psych: Normal affect. Extremities: No clubbing or cyanosis.   Assessment/Plan: 1. CAD: No ischemic symptoms.  Continue Plavix, statin, Imdur, and ARB.   2. Chronic diastolic CHF: Patient is not volume overloaded on exam.  Continue current dose of Lasix.  Recent BMET was ok.  3. Hyperlipidemia: Check  lipids, continue statin.  4. Fatigue: Will get TSH and CBC.   Marca Ancona 06/04/2013

## 2013-06-04 NOTE — Patient Instructions (Signed)
Your physician recommends that you have  lab work today--lipid profile/TSH/CBCd  You have been referred to [facility] to establish with a primary care doctor.   Your physician wants you to follow-up in: 6 months with Dr Shirlee Latch. (April 2015). You will receive a reminder letter in the mail two months in advance. If you don't receive a letter, please call our office to schedule the follow-up appointment.

## 2013-06-05 LAB — CBC WITH DIFFERENTIAL/PLATELET
Basophils Absolute: 0 10*3/uL (ref 0.0–0.1)
Eosinophils Absolute: 0.2 10*3/uL (ref 0.0–0.7)
Eosinophils Relative: 1.8 % (ref 0.0–5.0)
HCT: 33.9 % — ABNORMAL LOW (ref 36.0–46.0)
Hemoglobin: 11.5 g/dL — ABNORMAL LOW (ref 12.0–15.0)
Lymphocytes Relative: 42.7 % (ref 12.0–46.0)
Lymphs Abs: 3.6 10*3/uL (ref 0.7–4.0)
MCHC: 33.9 g/dL (ref 30.0–36.0)
MCV: 92.1 fl (ref 78.0–100.0)
Monocytes Absolute: 0.5 10*3/uL (ref 0.1–1.0)
Monocytes Relative: 6.1 % (ref 3.0–12.0)
Neutro Abs: 4.1 10*3/uL (ref 1.4–7.7)
Neutrophils Relative %: 48.9 % (ref 43.0–77.0)
Platelets: 359 10*3/uL (ref 150.0–400.0)
RDW: 13.4 % (ref 11.5–14.6)
WBC: 8.5 10*3/uL (ref 4.5–10.5)

## 2013-06-05 LAB — LIPID PANEL
Cholesterol: 163 mg/dL (ref 0–200)
HDL: 54.7 mg/dL (ref >39.00)
Triglycerides: 249 mg/dL — ABNORMAL HIGH (ref 0.0–149.0)

## 2013-06-05 LAB — TSH: TSH: 2.02 u[IU]/mL (ref 0.35–5.50)

## 2013-06-05 LAB — LDL CHOLESTEROL, DIRECT: Direct LDL: 79 mg/dL

## 2013-06-14 ENCOUNTER — Other Ambulatory Visit (HOSPITAL_COMMUNITY): Payer: Self-pay | Admitting: Interventional Radiology

## 2013-06-14 DIAGNOSIS — M549 Dorsalgia, unspecified: Secondary | ICD-10-CM

## 2013-06-14 DIAGNOSIS — IMO0002 Reserved for concepts with insufficient information to code with codable children: Secondary | ICD-10-CM

## 2013-06-17 ENCOUNTER — Ambulatory Visit (HOSPITAL_COMMUNITY)
Admission: RE | Admit: 2013-06-17 | Discharge: 2013-06-17 | Disposition: A | Discharge status: Home / Self Care | Payer: Medicare HMO | Source: Ambulatory Visit | Attending: Interventional Radiology | Admitting: Interventional Radiology

## 2013-06-17 DIAGNOSIS — M549 Dorsalgia, unspecified: Secondary | ICD-10-CM

## 2013-06-17 DIAGNOSIS — IMO0002 Reserved for concepts with insufficient information to code with codable children: Secondary | ICD-10-CM

## 2013-07-09 ENCOUNTER — Ambulatory Visit: Payer: Medicare HMO | Admitting: Family Medicine

## 2013-07-10 ENCOUNTER — Other Ambulatory Visit: Payer: Self-pay | Admitting: Cardiology

## 2013-10-29 ENCOUNTER — Ambulatory Visit: Payer: Medicare HMO

## 2013-10-29 ENCOUNTER — Other Ambulatory Visit: Payer: Medicare HMO

## 2013-10-30 ENCOUNTER — Telehealth: Payer: Self-pay | Admitting: Internal Medicine

## 2013-10-30 NOTE — Telephone Encounter (Signed)
returned pt call to r/s missed appt...done....pt aware of new d.t of appt

## 2013-11-14 ENCOUNTER — Ambulatory Visit (HOSPITAL_BASED_OUTPATIENT_CLINIC_OR_DEPARTMENT_OTHER): Payer: Medicare HMO | Admitting: Internal Medicine

## 2013-11-14 ENCOUNTER — Other Ambulatory Visit (HOSPITAL_BASED_OUTPATIENT_CLINIC_OR_DEPARTMENT_OTHER): Payer: Medicare HMO

## 2013-11-14 ENCOUNTER — Other Ambulatory Visit: Payer: Self-pay | Admitting: Medical Oncology

## 2013-11-14 VITALS — BP 109/51 | HR 72 | Temp 98.4°F | Resp 18 | Ht 62.0 in | Wt 132.3 lb

## 2013-11-14 DIAGNOSIS — D509 Iron deficiency anemia, unspecified: Secondary | ICD-10-CM

## 2013-11-14 DIAGNOSIS — D649 Anemia, unspecified: Secondary | ICD-10-CM

## 2013-11-14 LAB — COMPREHENSIVE METABOLIC PANEL (CC13)
ALT: 11 U/L (ref 0–55)
ANION GAP: 10 meq/L (ref 3–11)
AST: 20 U/L (ref 5–34)
Albumin: 4.2 g/dL (ref 3.5–5.0)
Alkaline Phosphatase: 64 U/L (ref 40–150)
BUN: 20.2 mg/dL (ref 7.0–26.0)
CO2: 23 meq/L (ref 22–29)
Calcium: 9.6 mg/dL (ref 8.4–10.4)
Chloride: 102 mEq/L (ref 98–109)
Creatinine: 1.4 mg/dL — ABNORMAL HIGH (ref 0.6–1.1)
Glucose: 124 mg/dl (ref 70–140)
Potassium: 4.7 mEq/L (ref 3.5–5.1)
SODIUM: 134 meq/L — AB (ref 136–145)
TOTAL PROTEIN: 7.3 g/dL (ref 6.4–8.3)
Total Bilirubin: 0.51 mg/dL (ref 0.20–1.20)

## 2013-11-14 LAB — CBC WITH DIFFERENTIAL/PLATELET
BASO%: 0.3 % (ref 0.0–2.0)
BASOS ABS: 0 10*3/uL (ref 0.0–0.1)
EOS ABS: 0.2 10*3/uL (ref 0.0–0.5)
EOS%: 2.3 % (ref 0.0–7.0)
HEMATOCRIT: 32.6 % — AB (ref 34.8–46.6)
HEMOGLOBIN: 11 g/dL — AB (ref 11.6–15.9)
LYMPH%: 38.8 % (ref 14.0–49.7)
MCH: 30.3 pg (ref 25.1–34.0)
MCHC: 33.8 g/dL (ref 31.5–36.0)
MCV: 89.7 fL (ref 79.5–101.0)
MONO#: 0.7 10*3/uL (ref 0.1–0.9)
MONO%: 10.5 % (ref 0.0–14.0)
NEUT%: 48.1 % (ref 38.4–76.8)
NEUTROS ABS: 3.3 10*3/uL (ref 1.5–6.5)
PLATELETS: 308 10*3/uL (ref 145–400)
RBC: 3.64 10*6/uL — AB (ref 3.70–5.45)
RDW: 13 % (ref 11.2–14.5)
WBC: 6.8 10*3/uL (ref 3.9–10.3)
lymph#: 2.6 10*3/uL (ref 0.9–3.3)

## 2013-11-14 LAB — FERRITIN CHCC: Ferritin: 225 ng/ml (ref 9–269)

## 2013-11-14 LAB — IRON AND TIBC CHCC
%SAT: 31 % (ref 21–57)
Iron: 83 ug/dL (ref 41–142)
TIBC: 269 ug/dL (ref 236–444)
UIBC: 186 ug/dL (ref 120–384)

## 2013-11-14 LAB — HOLD TUBE, BLOOD BANK

## 2013-11-15 ENCOUNTER — Telehealth: Payer: Self-pay | Admitting: Internal Medicine

## 2013-11-15 NOTE — Telephone Encounter (Signed)
, °

## 2013-11-18 NOTE — Progress Notes (Signed)
[facility] OFFICE PROGRESS NOTE  [name] Inch, MD [address] [place] [address]  DIAGNOSIS: Anemia - Plan: CBC with Differential, Comprehensive metabolic panel (Cmet) - [facility], Lactate dehydrogenase (LDH) - [facility], Ferritin, Iron and TIBC  Chief Complaint  Patient presents with  . Anemia    CURRENT TREATMENT:   No history exists.     INTERVAL HISTORY: [name] [name] 80 y.o. female was seen today for followup of her history of iron deficiency anemia. The patient was last seen by me on 05/06/2013.The patient has not required any additional IV iron since 03/21/2008. She lives with one of her sons in an apartment here in [place]. The patient denies any obvious blood in her stools. Stools are brown.   MEDICAL HISTORY: Past Medical History  Diagnosis Date  . SOB (shortness of breath)   . Fatigue   . Anxiety   . Seizure   . Dizziness   . Hot flashes   . Ischemic heart disease   . Hypertension   . Hyperlipidemia   . Coronary artery disease     [name]  . Anemia   . Syncope and collapse     RECURRENT, RELATED TO HYPONATREMIA, NARCOTICS, AND BRADYCARDIA  . Tobacco abuse     PAST  . Memory disorder   . Pneumonia   . Flu syndrome january 2013   PROBLEM LISTS: 1. Iron deficiency anemia dating back to 02/18/2008 when the ferritin was 14 and hemoglobin was 11.6. The patient received INFeD 2000 mg IV on  03/21/2008. She underwent a GI workup in the past by Dr. Sharrell Ku. Stools had been negative for occult blood.  2. Decreased memory  3. Fibromyalgia  4. Dyslipidemia  5. CAD - s/p angioplasty  6. Vertigo  7. Spinal stenosis  8. HTN  9. GERD  10. Depression  11. Seizures  INTERIM HISTORY: has SOB (shortness of breath); Fatigue; Anxiety; Seizure; Dizziness; Hot flashes; Ischemic heart disease; Hypertension; Hyperlipidemia; Syncope and collapse; Coronary artery disease; Anemia; Syncope and collapse; Tobacco abuse; Memory disorder;  Pneumonia; Orthostatic hypotension; GERD (gastroesophageal reflux disease); Flu syndrome; Tremulousness; Diastolic CHF, chronic; Hyponatremia; and Mild cognitive impairment on her problem list.    ALLERGIES:  is allergic to codeine.  MEDICATIONS: has a current medication list which includes the following prescription(s): acetaminophen, calcium carb-cholecalciferol, clopidogrel, esomeprazole, fluticasone-salmeterol, furosemide, hydrocodone-acetaminophen, isosorbide mononitrate, levetiracetam, multivitamin with minerals, nitroglycerin, pravastatin, pregabalin, sertraline, tiotropium, and valsartan.  SURGICAL HISTORY:  Past Surgical History  Procedure Laterality Date  . Cardiac catheterization  2004  . Abdominal hysterectomy      early 1970's for fibroid    REVIEW OF SYSTEMS:   Constitutional: Denies fevers, chills or abnormal weight loss Eyes: Denies blurriness of vision Ears, nose, mouth, throat, and face: Denies mucositis or sore throat Respiratory: Denies cough, dyspnea or wheezes Cardiovascular: Denies palpitation, chest discomfort or lower extremity swelling Gastrointestinal:  Denies nausea, heartburn or change in bowel habits Skin: Denies abnormal skin rashes Lymphatics: Denies new lymphadenopathy or easy bruising Neurological:Denies numbness, tingling or new weaknesses Behavioral/Psych: Mood is stable, no new changes  All other systems were reviewed with the patient and are negative.  PHYSICAL EXAMINATION: ECOG PERFORMANCE STATUS: 1 - Symptomatic but completely ambulatory  Blood pressure 109/51, pulse 72, temperature 98.4 F (36.9 C), temperature source Oral, resp. rate 18, height 5\' 2"  (1.575 m), weight 132 lb 4.8 oz (60.011 kg), SpO2 98.00%.  GENERAL:alert, no distress and comfortable; Pleasant 80 year old woman,somewhat frail. She uses a cane  in her right hand.  SKIN: skin color, texture, turgor are normal, no rashes or significant lesions EYES: normal, Conjunctiva are pink  and non-injected, sclera clear OROPHARYNX:no exudate, no erythema and lips, buccal mucosa, and tongue normal  NECK: supple, thyroid normal size, non-tender, without nodularity LYMPH:  no palpable lymphadenopathy in the cervical, axillary or supraclavicular LUNGS: clear to auscultation with normal breathing effort, no wheezes or rhonchi HEART: regular rate & rhythm and no murmurs and no lower extremity edema ABDOMEN:abdomen soft, non-tender and normal bowel sounds Musculoskeletal:no cyanosis of digits and no clubbing  NEURO: alert & oriented x 3 with fluent speech, no focal motor/sensory deficits  Labs:  Lab Results  Component Value Date   WBC 6.8 11/14/2013   HGB 11.0* 11/14/2013   HCT 32.6* 11/14/2013   MCV 89.7 11/14/2013   PLT 308 11/14/2013   NEUTROABS 3.3 11/14/2013      Chemistry      Component Value Date/Time   NA 134* 11/14/2013 1308   NA 137 05/28/2013 0916   K 4.7 11/14/2013 1308   K 4.0 05/28/2013 0916   CL 101 05/28/2013 0916   CL 104 11/05/2012 1411   CO2 23 11/14/2013 1308   CO2 25 05/28/2013 0916   BUN 20.2 11/14/2013 1308   BUN 13 05/28/2013 0916   CREATININE 1.4* 11/14/2013 1308   CREATININE 1.12* 05/28/2013 0916      Component Value Date/Time   CALCIUM 9.6 11/14/2013 1308   CALCIUM 9.3 05/28/2013 0916   ALKPHOS 64 11/14/2013 1308   ALKPHOS 50 04/12/2012 1347   AST 20 11/14/2013 1308   AST 19 04/12/2012 1347   ALT 11 11/14/2013 1308   ALT 11 04/12/2012 1347   BILITOT 0.51 11/14/2013 1308   BILITOT 0.4 04/12/2012 1347       Basic Metabolic Panel:  Recent Labs Lab 11/14/13 1308  NA 134*  K 4.7  CO2 23  GLUCOSE 124  BUN 20.2  CREATININE 1.4*  CALCIUM 9.6   GFR Estimated Creatinine Clearance: 23.7 ml/min (by C-G formula based on Cr of 1.4). Liver Function Tests:  Recent Labs Lab 11/14/13 1308  AST 20  ALT 11  ALKPHOS 64  BILITOT 0.51  PROT 7.3  ALBUMIN 4.2   No results found for this basename: LIPASE, AMYLASE,  in the last 168 hours No results found  for this basename: AMMONIA,  in the last 168 hours Coagulation profile No results found for this basename: INR, PROTIME,  in the last 168 hours  CBC:  Recent Labs Lab 11/14/13 1351  WBC 6.8  NEUTROABS 3.3  HGB 11.0*  HCT 32.6*  MCV 89.7  PLT 308   Iron/TIBC/Ferritin    Component Value Date/Time   IRON 83 11/14/2013 1308   IRON 65 11/05/2012 1411   TIBC 269 11/14/2013 1308   TIBC 275 11/05/2012 1411   FERRITIN 225 11/14/2013 1308   FERRITIN 262 11/05/2012 1411   Anemia work up No results found for this basename: VITAMINB12, FOLATE, FERRITIN, TIBC, IRON, RETICCTPCT,  in the last 72 hours  Studies:  No results found.   RADIOGRAPHIC STUDIES: No results found.  ASSESSMENT: [name] Clayton 80 y.o. female with a history of Anemia - Plan: CBC with Differential, Comprehensive metabolic panel (Cmet) - [facility], Lactate dehydrogenase (LDH) - [facility], Ferritin, Iron and TIBC   PLAN:   1. Iron-deficiency Anemia.  -- Ms. Lucretia RoersWood seems to be doing well from a hematologic standpoint. Her iron studies are WNL. Her hemoglobin and hematocrit remains  stable. Her last ferritin is 225.   2. Follow-up. --We will plan to see the patient again in 6 months, at which time we will check CBC, chemistries and iron studies.  All questions were answered. The patient knows to call the clinic with any problems, questions or concerns. We can certainly see the patient much sooner if necessary.  I spent 10 minutes counseling the patient face to face. The total time spent in the appointment was 15 minutes.    Bannie Lobban, MD 11/18/2013 5:49 AM

## 2013-11-23 ENCOUNTER — Other Ambulatory Visit: Payer: Self-pay | Admitting: Cardiology

## 2013-11-23 MED ORDER — FUROSEMIDE 20 MG PO TABS: ORAL_TABLET | ORAL | Status: DC

## 2013-12-19 ENCOUNTER — Encounter: Payer: Self-pay | Admitting: Cardiology

## 2013-12-19 ENCOUNTER — Ambulatory Visit (INDEPENDENT_AMBULATORY_CARE_PROVIDER_SITE_OTHER): Payer: Medicare HMO | Admitting: Cardiology

## 2013-12-19 ENCOUNTER — Encounter: Payer: Self-pay | Admitting: *Deleted

## 2013-12-19 VITALS — BP 125/65 | HR 71 | Wt 130.0 lb

## 2013-12-19 DIAGNOSIS — I5032 Chronic diastolic (congestive) heart failure: Secondary | ICD-10-CM

## 2013-12-19 DIAGNOSIS — I509 Heart failure, unspecified: Secondary | ICD-10-CM

## 2013-12-19 DIAGNOSIS — R0602 Shortness of breath: Secondary | ICD-10-CM

## 2013-12-19 DIAGNOSIS — I2510 Atherosclerotic heart disease of native coronary artery without angina pectoris: Secondary | ICD-10-CM

## 2013-12-19 DIAGNOSIS — I10 Essential (primary) hypertension: Secondary | ICD-10-CM

## 2013-12-19 NOTE — Patient Instructions (Signed)
Your physician has requested that you have an echocardiogram. Echocardiography is a painless test that uses sound waves to create images of your heart. It provides your doctor with information about the size and shape of your heart and how well your heart's chambers and valves are working. This procedure takes approximately one hour. There are no restrictions for this procedure.  Your physician wants you to follow-up in: 6 months. (October 2015). You will receive a reminder letter in the mail two months in advance. If you don't receive a letter, please call our office to schedule the follow-up appointment.

## 2013-12-21 NOTE — Progress Notes (Signed)
Patient ID: [name] [name], female   DOB: Sep 21, 1926, [age] y.o.   MRN: [id number] PCP: Dr. Cyndia BentBadger  80 yo with history of CAD and seizure disorder presents for cardiology followup.  She has chronic diastolic CHF and is on a low dose of Lasix.  She reports generalized fatigue.  BP was low this week, SBP has been running in the 90s at home.  She has been lightheaded with standing.  No falls.  She is using her cane.  She recently saw her PCP and valsartan was held.  She has felt better since then.  She has stable dyspnea.  She walks her dog in her yard and does ok as long as she walks slowly.    Labs (9/12): K 5.5, creatinine 1.3, TSH mildly elevated  Labs (10/12); BNP 277 Labs (1/13): LDL 90, HDL 61, K 5, creatinine 1.2 Labs (2/13): K 4.8, creatinine 1.13, BNP 97, HCT 31.9 Labs (10/14): K 4, creatinine 1.12, LDL 79, HDL 55, TSH normal Labs (3/15): HCT 32.6, K 4.7, creatinine 1.4  ECG: NSR, normal  PMH: 1.  Seizure disorder 2.  CAD: Left heart cath in 2004 showed 70% ostial LAD stenosis and 50-60% proximal ramus disease.  It was decided to manage her medically.  Myoview (5/11) showed EF 74% and was a normal study.   3. Chronic diastolic CHF: Echo (6/08) showed EF 55-60% with grade I diastolic dysfunction, mild LAE.  4. HTN 5. Hyperlipidemia 6. GERD 7. Chronic Fe deficiency anemia. 8. H/o hyponatremia 9. Memory difficulty 10. COPD  SH: Lives in [place], 1 son lives with her, other son died of cancer.  Prior smoker.   FH: Parents with CAD  ROS: All systems reviewed and negative except as per HPI.   Current Outpatient Prescriptions  Medication Sig Dispense Refill  . acetaminophen (TYLENOL) 500 MG tablet Take 1,000 mg by mouth every 6 (six) hours as needed. For pain.      . Calcium Carbonate-Vitamin D (CALCIUM 600 + D PO) Take 1 tablet by mouth daily.       . clopidogrel (PLAVIX) 75 MG tablet Take 75 mg by mouth daily.      Marland Kitchen. esomeprazole (NEXIUM) 40 MG capsule Take 40 mg by mouth daily  before breakfast.        . Fluticasone-Salmeterol (ADVAIR) 250-50 MCG/DOSE AEPB Inhale 1 puff into the lungs every 12 (twelve) hours.      . furosemide (LASIX) 20 MG tablet take 1 tablet by mouth once daily  30 tablet  3  . HYDROcodone-acetaminophen (NORCO/VICODIN) 5-325 MG per tablet Take 1 tablet by mouth every 6 (six) hours as needed for pain. 5-300 MG      . isosorbide mononitrate (IMDUR) 30 MG 24 hr tablet Take 30 mg by mouth daily.       Marland Kitchen. levETIRAcetam (KEPPRA) 500 MG tablet Take 750 mg by mouth 2 (two) times daily. 1 1/2 tablets bid      . Multiple Vitamin (MULTIVITAMIN WITH MINERALS) TABS Take 1 tablet by mouth daily.      . nitroGLYCERIN (NITROSTAT) 0.4 MG SL tablet Place 0.4 mg under the tongue every 5 (five) minutes as needed for chest pain.      . pravastatin (PRAVACHOL) 40 MG tablet Take 40 mg by mouth daily.      . pregabalin (LYRICA) 75 MG capsule Take 75 mg by mouth at bedtime.       . sertraline (ZOLOFT) 100 MG tablet Take 100 mg by mouth daily.       .Marland Kitchen  tiotropium (SPIRIVA) 18 MCG inhalation capsule Place 18 mcg into inhaler and inhale daily.       No current facility-administered medications for this visit.    BP 125/65  Pulse 71  Wt 58.968 kg (130 lb) General: NAD, elderly Neck: No JVD, no thyromegaly or thyroid nodule.  Lungs: Clear to auscultation bilaterally with normal respiratory effort. CV: Nondisplaced PMI.  Heart regular S1/S2, no S3/S4, 1/6 early SEM.  No peripheral edema.  No carotid bruit.  Normal pedal pulses.  Abdomen: Soft, nontender, no hepatosplenomegaly, no distention.  Neurologic: Alert and oriented x 3.  Psych: Normal affect. Extremities: No clubbing or cyanosis.   Assessment/Plan: 1. CAD: No ischemic symptoms.  Continue Plavix, statin, Imdur. 2. Chronic diastolic CHF: Patient is not volume overloaded on exam.  Continue current dose of Lasix.  Recent BMET showed a mildly increased creatinine.  Given some exertional dyspnea as well as lightheaded  spells, I am going to get an echocardiogram as she has not had one done since 2008.  3. Hyperlipidemia: Good lipids in 10/14.  4. Lightheadedness: I think she she can stay off valsartan.  This change has helped.   [name] MoraleDalton S Donivin [name] 12/21/2013

## 2014-01-06 ENCOUNTER — Other Ambulatory Visit (HOSPITAL_COMMUNITY): Payer: Medicare HMO

## 2014-01-16 ENCOUNTER — Ambulatory Visit (INDEPENDENT_AMBULATORY_CARE_PROVIDER_SITE_OTHER): Payer: Medicare HMO | Admitting: Emergency Medicine

## 2014-01-16 VITALS — BP 130/80 | HR 61 | Temp 97.9°F | Resp 18

## 2014-01-16 DIAGNOSIS — R5383 Other fatigue: Secondary | ICD-10-CM

## 2014-01-16 DIAGNOSIS — D649 Anemia, unspecified: Secondary | ICD-10-CM

## 2014-01-16 DIAGNOSIS — G40802 Other epilepsy, not intractable, without status epilepticus: Secondary | ICD-10-CM

## 2014-01-16 DIAGNOSIS — R42 Dizziness and giddiness: Secondary | ICD-10-CM

## 2014-01-16 DIAGNOSIS — R5381 Other malaise: Secondary | ICD-10-CM

## 2014-01-16 LAB — POCT CBC
GRANULOCYTE PERCENT: 48.3 % (ref 37–80)
HCT, POC: 33.8 % — AB (ref 37.7–47.9)
HEMOGLOBIN: 10.7 g/dL — AB (ref 12.2–16.2)
Lymph, poc: 3.1 (ref 0.6–3.4)
MCH, POC: 29.3 pg (ref 27–31.2)
MCHC: 31.7 g/dL — AB (ref 31.8–35.4)
MCV: 92.5 fL (ref 80–97)
MID (cbc): 0.5 (ref 0–0.9)
MPV: 8.6 fL (ref 0–99.8)
POC GRANULOCYTE: 3.4 (ref 2–6.9)
POC LYMPH %: 44 % (ref 10–50)
POC MID %: 7.7 %M (ref 0–12)
Platelet Count, POC: 280 10*3/uL (ref 142–424)
RBC: 3.65 M/uL — AB (ref 4.04–5.48)
RDW, POC: 13.9 %
WBC: 7 10*3/uL (ref 4.6–10.2)

## 2014-01-16 LAB — POCT URINALYSIS DIPSTICK
BILIRUBIN UA: NEGATIVE
Blood, UA: NEGATIVE
GLUCOSE UA: NEGATIVE
Ketones, UA: NEGATIVE
LEUKOCYTES UA: NEGATIVE
Nitrite, UA: NEGATIVE
Protein, UA: NEGATIVE
Spec Grav, UA: 1.02
Urobilinogen, UA: 0.2
pH, UA: 5.5

## 2014-01-16 LAB — POCT UA - MICROSCOPIC ONLY
Casts, Ur, LPF, POC: NEGATIVE
Crystals, Ur, HPF, POC: NEGATIVE
Mucus, UA: NEGATIVE
Yeast, UA: NEGATIVE

## 2014-01-16 LAB — GLUCOSE, POCT (MANUAL RESULT ENTRY): POC GLUCOSE: 93 mg/dL (ref 70–99)

## 2014-01-16 NOTE — Progress Notes (Signed)
[facility] [address], [place] [address] [phone]  Date:  01/16/2014   Name:  [name] [name]   DOB:  12-08-1926   MRN:  [id number]  PCP:  [name], MD    Chief Complaint: Extremity Weakness, Depression and Referral   History of Present Illness:  [name] [name] is a 80 y.o. very pleasant female patient who presents with the following:  Had a seizure a week ago.  Tells me she has not "regrouped" well since the seizure.  No fever or chills.  No nausea or vomiting.  No change in appetite.  Is under a great deal of stress with a pending move to a retirement home packing and preparing her effects.  She has no recent falls.  Walks with cane due dizziness.  No fever or chills.  No improvement with over the counter medications or other home remedies. Denies other complaint or health concern today.   Patient Active Problem List   Diagnosis Date Noted  . Mild cognitive impairment 06/28/2012  . Hyponatremia 03/12/2012  . Tremulousness 11/17/2011  . Diastolic CHF, chronic 11/17/2011  . Flu syndrome   . GERD (gastroesophageal reflux disease) 09/19/2011  . Orthostatic hypotension 05/11/2011  . SOB (shortness of breath)   . Fatigue   . Anxiety   . Seizure   . Dizziness   . Hot flashes   . Ischemic heart disease   . Hypertension   . Hyperlipidemia   . Syncope and collapse   . Coronary artery disease   . Anemia   . Syncope and collapse   . Tobacco abuse   . Memory disorder   . Pneumonia     Past Medical History  Diagnosis Date  . SOB (shortness of breath)   . Fatigue   . Anxiety   . Seizure   . Dizziness   . Hot flashes   . Ischemic heart disease   . Hypertension   . Hyperlipidemia   . Coronary artery disease     [name]  . Anemia   . Syncope and collapse     RECURRENT, RELATED TO HYPONATREMIA, NARCOTICS, AND BRADYCARDIA  . Tobacco abuse     PAST  . Memory disorder   . Pneumonia   . Flu syndrome january 2013    Past  Surgical History  Procedure Laterality Date  . Cardiac catheterization  2004  . Abdominal hysterectomy      early 1970's for fibroid    History  Substance Use Topics  . Smoking status: Former Smoker -- 1.00 packs/day for 40 years    Quit date: 08/23/1999  . Smokeless tobacco: Never Used  . Alcohol Use: No    Family History  Problem Relation Age of Onset  . Heart attack Father   . Heart failure Mother   . CAD Father   . Congestive Heart Failure Mother   . Other Brother     cerebral hemmorhage  . Coronary artery disease Sister     Allergies  Allergen Reactions  . Codeine Nausea And Vomiting    Medication list has been reviewed and updated.  Current Outpatient Prescriptions on File Prior to Visit  Medication Sig Dispense Refill  . acetaminophen (TYLENOL) 500 MG tablet Take 1,000 mg by mouth every 6 (six) hours as needed. For pain.      . Calcium Carbonate-Vitamin D (CALCIUM 600 + D PO) Take 1 tablet by mouth daily.       . clopidogrel (  PLAVIX) 75 MG tablet Take 75 mg by mouth daily.      Marland Kitchen. esomeprazole (NEXIUM) 40 MG capsule Take 40 mg by mouth daily before breakfast.        . Fluticasone-Salmeterol (ADVAIR) 250-50 MCG/DOSE AEPB Inhale 1 puff into the lungs every 12 (twelve) hours.      . furosemide (LASIX) 20 MG tablet take 1 tablet by mouth once daily  30 tablet  3  . HYDROcodone-acetaminophen (NORCO/VICODIN) 5-325 MG per tablet Take 1 tablet by mouth every 6 (six) hours as needed for pain. 5-300 MG      . levETIRAcetam (KEPPRA) 500 MG tablet Take 750 mg by mouth 2 (two) times daily. 1 1/2 tablets bid      . Multiple Vitamin (MULTIVITAMIN WITH MINERALS) TABS Take 1 tablet by mouth daily.      . nitroGLYCERIN (NITROSTAT) 0.4 MG SL tablet Place 0.4 mg under the tongue every 5 (five) minutes as needed for chest pain.      . pravastatin (PRAVACHOL) 40 MG tablet Take 40 mg by mouth daily.      . pregabalin (LYRICA) 75 MG capsule Take 75 mg by mouth at bedtime.       .  sertraline (ZOLOFT) 100 MG tablet Take 100 mg by mouth daily.       Marland Kitchen. tiotropium (SPIRIVA) 18 MCG inhalation capsule Place 18 mcg into inhaler and inhale daily.      . isosorbide mononitrate (IMDUR) 30 MG 24 hr tablet Take 30 mg by mouth daily.        No current facility-administered medications on file prior to visit.    Review of Systems:  As per HPI, otherwise negative.    Physical Examination: Filed Vitals:   01/16/14 2025  BP: 130/80  Pulse: 61  Temp: 97.9 F (36.6 C)  Resp: 18   There were no vitals filed for this visit. There is no weight on file to calculate BMI. Ideal Body Weight:    GEN: WDWN, NAD, Non-toxic, A & O x 3 HEENT: Atraumatic, Normocephalic. Neck supple. No masses, No LAD. Ears and Nose: No external deformity. CV: RRR, No M/G/R. No JVD. No thrill. No extra heart sounds. PULM: CTA B, no wheezes, crackles, rhonchi. No retractions. No resp. distress. No accessory muscle use. ABD: S, NT, ND, +BS. No rebound. No HSM. EXTR: No c/c/e NEURO assisted gait with a cane.  PSYCH: Normally interactive. Conversant. Not depressed or anxious appearing.  Calm demeanor.    Assessment and Plan: Fatigue Anemia Seizure disorder Labs pending F/U FMD  Signed,  Phillips OdorJeffery Evaristo Tsuda, MD   Results for orders placed in visit on 01/16/14  POCT CBC      Result Value Ref Range   WBC 7.0  4.6 - 10.2 K/uL   Lymph, poc 3.1  0.6 - 3.4   POC LYMPH PERCENT 44.0  10 - 50 %L   MID (cbc) 0.5  0 - 0.9   POC MID % 7.7  0 - 12 %M   POC Granulocyte 3.4  2 - 6.9   Granulocyte percent 48.3  37 - 80 %G   RBC 3.65 (*) 4.04 - 5.48 M/uL   Hemoglobin 10.7 (*) 12.2 - 16.2 g/dL   HCT, POC 16.133.8 (*) 09.637.7 - 47.9 %   MCV 92.5  80 - 97 fL   MCH, POC 29.3  27 - 31.2 pg   MCHC 31.7 (*) 31.8 - 35.4 g/dL   RDW, POC 04.513.9     Platelet  Count, POC 280  142 - 424 K/uL   MPV 8.6  0 - 99.8 fL  POCT UA - MICROSCOPIC ONLY      Result Value Ref Range   WBC, Ur, HPF, POC 0-2     RBC, urine, microscopic  0-2     Bacteria, U Microscopic trace     Mucus, UA neg     Epithelial cells, urine per micros 1-3     Crystals, Ur, HPF, POC neg     Casts, Ur, LPF, POC neg     Yeast, UA neg    POCT URINALYSIS DIPSTICK      Result Value Ref Range   Color, UA yellow     Clarity, UA clear     Glucose, UA neg     Bilirubin, UA neg     Ketones, UA neg     Spec Grav, UA 1.020     Blood, UA neg     pH, UA 5.5     Protein, UA neg     Urobilinogen, UA 0.2     Nitrite, UA neg     Leukocytes, UA Negative    GLUCOSE, POCT (MANUAL RESULT ENTRY)      Result Value Ref Range   POC Glucose 93  70 - 99 mg/dl

## 2014-01-16 NOTE — Patient Instructions (Signed)

## 2014-01-17 LAB — COMPREHENSIVE METABOLIC PANEL
ALBUMIN: 4.5 g/dL (ref 3.5–5.2)
ALT: 12 U/L (ref 0–35)
AST: 20 U/L (ref 0–37)
Alkaline Phosphatase: 57 U/L (ref 39–117)
BUN: 17 mg/dL (ref 6–23)
CALCIUM: 9.7 mg/dL (ref 8.4–10.5)
CHLORIDE: 100 meq/L (ref 96–112)
CO2: 25 meq/L (ref 19–32)
Creat: 1.4 mg/dL — ABNORMAL HIGH (ref 0.50–1.10)
Glucose, Bld: 92 mg/dL (ref 70–99)
POTASSIUM: 4.3 meq/L (ref 3.5–5.3)
Sodium: 133 mEq/L — ABNORMAL LOW (ref 135–145)
TOTAL PROTEIN: 7.1 g/dL (ref 6.0–8.3)
Total Bilirubin: 0.4 mg/dL (ref 0.2–1.2)

## 2014-01-24 ENCOUNTER — Ambulatory Visit (HOSPITAL_COMMUNITY): Payer: Medicare HMO | Attending: Cardiology | Admitting: Radiology

## 2014-01-24 DIAGNOSIS — J449 Chronic obstructive pulmonary disease, unspecified: Secondary | ICD-10-CM | POA: Insufficient documentation

## 2014-01-24 DIAGNOSIS — R55 Syncope and collapse: Secondary | ICD-10-CM | POA: Diagnosis not present

## 2014-01-24 DIAGNOSIS — J4489 Other specified chronic obstructive pulmonary disease: Secondary | ICD-10-CM | POA: Insufficient documentation

## 2014-01-24 DIAGNOSIS — R0602 Shortness of breath: Secondary | ICD-10-CM

## 2014-01-24 DIAGNOSIS — E785 Hyperlipidemia, unspecified: Secondary | ICD-10-CM | POA: Insufficient documentation

## 2014-01-24 DIAGNOSIS — I509 Heart failure, unspecified: Secondary | ICD-10-CM | POA: Diagnosis not present

## 2014-01-24 DIAGNOSIS — I079 Rheumatic tricuspid valve disease, unspecified: Secondary | ICD-10-CM | POA: Insufficient documentation

## 2014-01-24 DIAGNOSIS — I10 Essential (primary) hypertension: Secondary | ICD-10-CM

## 2014-01-24 DIAGNOSIS — I2510 Atherosclerotic heart disease of native coronary artery without angina pectoris: Secondary | ICD-10-CM | POA: Diagnosis not present

## 2014-01-24 NOTE — Progress Notes (Signed)
Echocardiogram performed.  

## 2014-02-19 ENCOUNTER — Encounter (HOSPITAL_COMMUNITY): Payer: Self-pay | Admitting: Emergency Medicine

## 2014-02-19 ENCOUNTER — Emergency Department (HOSPITAL_COMMUNITY): Payer: Medicare HMO

## 2014-02-19 ENCOUNTER — Emergency Department (HOSPITAL_COMMUNITY)
Admission: EM | Admit: 2014-02-19 | Discharge: 2014-02-20 | Disposition: A | Discharge status: Home / Self Care | Payer: Medicare HMO | Attending: Emergency Medicine | Admitting: Emergency Medicine

## 2014-02-19 DIAGNOSIS — Z79899 Other long term (current) drug therapy: Secondary | ICD-10-CM | POA: Insufficient documentation

## 2014-02-19 DIAGNOSIS — F419 Anxiety disorder, unspecified: Secondary | ICD-10-CM

## 2014-02-19 DIAGNOSIS — R531 Weakness: Secondary | ICD-10-CM

## 2014-02-19 DIAGNOSIS — Z7901 Long term (current) use of anticoagulants: Secondary | ICD-10-CM | POA: Insufficient documentation

## 2014-02-19 DIAGNOSIS — R5383 Other fatigue: Secondary | ICD-10-CM

## 2014-02-19 DIAGNOSIS — J159 Unspecified bacterial pneumonia: Secondary | ICD-10-CM | POA: Insufficient documentation

## 2014-02-19 DIAGNOSIS — I2510 Atherosclerotic heart disease of native coronary artery without angina pectoris: Secondary | ICD-10-CM | POA: Insufficient documentation

## 2014-02-19 DIAGNOSIS — Z8742 Personal history of other diseases of the female genital tract: Secondary | ICD-10-CM | POA: Insufficient documentation

## 2014-02-19 DIAGNOSIS — J189 Pneumonia, unspecified organism: Secondary | ICD-10-CM

## 2014-02-19 DIAGNOSIS — D649 Anemia, unspecified: Secondary | ICD-10-CM | POA: Insufficient documentation

## 2014-02-19 DIAGNOSIS — I10 Essential (primary) hypertension: Secondary | ICD-10-CM | POA: Insufficient documentation

## 2014-02-19 DIAGNOSIS — F411 Generalized anxiety disorder: Secondary | ICD-10-CM | POA: Insufficient documentation

## 2014-02-19 DIAGNOSIS — Z9889 Other specified postprocedural states: Secondary | ICD-10-CM | POA: Insufficient documentation

## 2014-02-19 DIAGNOSIS — G40909 Epilepsy, unspecified, not intractable, without status epilepticus: Secondary | ICD-10-CM | POA: Insufficient documentation

## 2014-02-19 DIAGNOSIS — R5381 Other malaise: Secondary | ICD-10-CM | POA: Insufficient documentation

## 2014-02-19 DIAGNOSIS — Z87891 Personal history of nicotine dependence: Secondary | ICD-10-CM | POA: Insufficient documentation

## 2014-02-19 DIAGNOSIS — E785 Hyperlipidemia, unspecified: Secondary | ICD-10-CM | POA: Insufficient documentation

## 2014-02-19 LAB — URINALYSIS, ROUTINE W REFLEX MICROSCOPIC
Bilirubin Urine: NEGATIVE
Glucose, UA: NEGATIVE mg/dL
Hgb urine dipstick: NEGATIVE
KETONES UR: NEGATIVE mg/dL
NITRITE: NEGATIVE
Protein, ur: 30 mg/dL — AB
Specific Gravity, Urine: 1.019 (ref 1.005–1.030)
UROBILINOGEN UA: 0.2 mg/dL (ref 0.0–1.0)
pH: 5.5 (ref 5.0–8.0)

## 2014-02-19 LAB — CBC
HCT: 34.1 % — ABNORMAL LOW (ref 36.0–46.0)
Hemoglobin: 11.5 g/dL — ABNORMAL LOW (ref 12.0–15.0)
MCH: 29.6 pg (ref 26.0–34.0)
MCHC: 33.7 g/dL (ref 30.0–36.0)
MCV: 87.9 fL (ref 78.0–100.0)
Platelets: 282 10*3/uL (ref 150–400)
RBC: 3.88 MIL/uL (ref 3.87–5.11)
RDW: 12.6 % (ref 11.5–15.5)
WBC: 9.9 10*3/uL (ref 4.0–10.5)

## 2014-02-19 LAB — COMPREHENSIVE METABOLIC PANEL
ALBUMIN: 4.1 g/dL (ref 3.5–5.2)
ALT: 11 U/L (ref 0–35)
ANION GAP: 15 (ref 5–15)
AST: 21 U/L (ref 0–37)
Alkaline Phosphatase: 64 U/L (ref 39–117)
BILIRUBIN TOTAL: 0.2 mg/dL — AB (ref 0.3–1.2)
BUN: 18 mg/dL (ref 6–23)
CHLORIDE: 95 meq/L — AB (ref 96–112)
CO2: 22 mEq/L (ref 19–32)
CREATININE: 1.27 mg/dL — AB (ref 0.50–1.10)
Calcium: 10.3 mg/dL (ref 8.4–10.5)
GFR calc non Af Amer: 37 mL/min — ABNORMAL LOW (ref >90)
GFR, EST AFRICAN AMERICAN: 43 mL/min — AB (ref >90)
GLUCOSE: 116 mg/dL — AB (ref 70–99)
Potassium: 3.7 mEq/L (ref 3.7–5.3)
Sodium: 132 mEq/L — ABNORMAL LOW (ref 137–147)
Total Protein: 6.9 g/dL (ref 6.0–8.3)

## 2014-02-19 LAB — I-STAT TROPONIN, ED: Troponin i, poc: 0.02 ng/mL (ref 0.00–0.08)

## 2014-02-19 LAB — URINE MICROSCOPIC-ADD ON

## 2014-02-19 LAB — I-STAT CG4 LACTIC ACID, ED: LACTIC ACID, VENOUS: 1.47 mmol/L (ref 0.5–2.2)

## 2014-02-19 LAB — CBG MONITORING, ED: Glucose-Capillary: 120 mg/dL — ABNORMAL HIGH (ref 70–99)

## 2014-02-19 MED ORDER — SODIUM CHLORIDE 0.9 % IV BOLUS (SEPSIS)
1000.0000 mL | Freq: Once | INTRAVENOUS | Status: AC
  Administered 2014-02-19: 1000 mL via INTRAVENOUS

## 2014-02-19 MED ORDER — LORAZEPAM 0.5 MG PO TABS
0.5000 mg | ORAL_TABLET | Freq: Once | ORAL | Status: AC
  Administered 2014-02-19: 0.5 mg via ORAL
  Filled 2014-02-19: qty 1

## 2014-02-19 MED ORDER — LEVOFLOXACIN 500 MG PO TABS
750.0000 mg | ORAL_TABLET | Freq: Once | ORAL | Status: AC
  Administered 2014-02-19: 750 mg via ORAL
  Filled 2014-02-19: qty 2

## 2014-02-19 MED ORDER — AZITHROMYCIN 250 MG PO TABS: 250.0000 mg | ORAL_TABLET | Freq: Every day | ORAL | Status: DC

## 2014-02-19 MED ORDER — GI COCKTAIL ~~LOC~~
30.0000 mL | Freq: Once | ORAL | Status: AC
  Administered 2014-02-19: 30 mL via ORAL
  Filled 2014-02-19: qty 30

## 2014-02-19 MED ORDER — ONDANSETRON HCL 4 MG/2ML IJ SOLN
4.0000 mg | Freq: Once | INTRAMUSCULAR | Status: AC
  Administered 2014-02-19: 4 mg via INTRAVENOUS
  Filled 2014-02-19: qty 2

## 2014-02-19 NOTE — ED Notes (Signed)
Awaiting sons arrival to d/c patient.

## 2014-02-19 NOTE — ED Notes (Signed)
Pt returned from XRAY 

## 2014-02-19 NOTE — ED Notes (Signed)
Per Family, in the last 4 days pt has had altered mental status. Pt has begun having severe panic attacks and "verbal changes" - according to family pt keep ssaying "oh, Lord" and moaning.  Family sts "she is climbing the walls." Pt has recently lost sons, and has recently had medication changes. Pt A&Ox4. Pt sts that has generalized pain.

## 2014-02-19 NOTE — ED Notes (Signed)
PA at bedside.

## 2014-02-19 NOTE — ED Notes (Signed)
Pt actively vomiting.

## 2014-02-19 NOTE — ED Notes (Addendum)
Pt calling son for ride, he states he will be coming to pick her up.

## 2014-02-19 NOTE — ED Provider Notes (Signed)
Medical screening examination/treatment/procedure(s) were conducted as a shared visit with non-physician practitioner(s) and myself.  I personally evaluated the patient during the encounter.   EKG Interpretation   Date/Time:  Wednesday February 19 2014 19:06:40 EDT Ventricular Rate:  93 PR Interval:  151 QRS Duration: 71 QT Interval:  369 QTC Calculation: 459 R Axis:   -11 Text Interpretation:  Sinus rhythm Low voltage, precordial leads  Borderline T abnormalities, anterior leads No significant change since  last tracing Confirmed by ALLEN  MD, ANTHONY ([id number]) on 02/19/2014 10:35:15  PM     Patient here complaining of persistent depression since loss of a son. She has no suicidal or homicidal ideations. X-ray shows possible pneumonia. Will treat with Levaquin here and discharged home with followup with her physician. No evidence of hypoxemia time of discharge.  Toy BakerAnthony T Allen, MD 02/19/14 2239

## 2014-02-19 NOTE — ED Notes (Signed)
Pt transported to XRAY °

## 2014-02-19 NOTE — ED Notes (Signed)
Pt ambulated to restroom with steady gait.

## 2014-02-19 NOTE — Discharge Instructions (Signed)
Antibiotics as prescribed until all gone for pneumonia. Please call your doctor and follow up in the next 2 days for recheck and for ajustment of depression/anxiety medications. Please discuss also if therapy would be appropriate. Return if symptoms are worsening.

## 2014-02-19 NOTE — ED Provider Notes (Signed)
CSN: [id number]     Arrival date & time 02/19/14  1841 History   First MD Initiated Contact with Patient 02/19/14 1959     Chief Complaint  Patient presents with  . Altered Mental Status     (Consider location/radiation/quality/duration/timing/severity/associated sxs/prior Treatment) HPI [name] [name] is a 80 y.o. female who presents to ED from home with her son who is concerned that pt is failing to thrive. According to him, pt has had frequent panic attacks, not eating or drinking. States she is "hollaring out and moaning." Son states she has had a very hard time coping with a loss of her son who passed away a year ago. She recently, 3 wks ago, was switched from zoloft to celexa. Pt denies any complaints to me other than she states "I just dont feel good." She denies any pain. No nausea, vomiting, headache, chest pain, SOB, abdominal pain. States at times she does gets chills. Denies urinary symptoms. No other complaints.   Past Medical History  Diagnosis Date  . SOB (shortness of breath)   . Fatigue   . Anxiety   . Seizure   . Dizziness   . Hot flashes   . Ischemic heart disease   . Hypertension   . Hyperlipidemia   . Coronary artery disease     [facility] ShelterStanley Tennant  . Anemia   . Syncope and collapse     RECURRENT, RELATED TO HYPONATREMIA, NARCOTICS, AND BRADYCARDIA  . Tobacco abuse     PAST  . Memory disorder   . Pneumonia   . Flu syndrome january 2013   Past Surgical History  Procedure Laterality Date  . Cardiac catheterization  2004  . Abdominal hysterectomy      early 1970's for fibroid   Family History  Problem Relation Age of Onset  . Heart attack Father   . Heart failure Mother   . CAD Father   . Congestive Heart Failure Mother   . Other Brother     cerebral hemmorhage  . Coronary artery disease Sister    History  Substance Use Topics  . Smoking status: Former Smoker -- 1.00 packs/day for 40 years    Quit date: 08/23/1999  . Smokeless tobacco: Never Used  .  Alcohol Use: No   OB History   Grav Para Term Preterm Abortions TAB SAB Ect Mult Living                 Review of Systems  Constitutional: Positive for fatigue. Negative for fever and chills.  Respiratory: Negative for cough, chest tightness and shortness of breath.   Cardiovascular: Negative for chest pain, palpitations and leg swelling.  Gastrointestinal: Negative for nausea, vomiting, abdominal pain and diarrhea.  Genitourinary: Negative for dysuria, flank pain and pelvic pain.  Musculoskeletal: Negative for arthralgias, myalgias, neck pain and neck stiffness.  Skin: Negative for rash.  Neurological: Positive for weakness. Negative for dizziness and headaches.  All other systems reviewed and are negative.     Allergies  Codeine  Home Medications   Prior to Admission medications   Medication Sig Start Date End Date Taking? Authorizing Provider  acetaminophen (TYLENOL) 500 MG tablet Take 1,000 mg by mouth every 6 (six) hours as needed. For pain.    Historical Provider, MD  Calcium Carbonate-Vitamin D (CALCIUM 600 + D PO) Take 1 tablet by mouth daily.     Historical Provider, MD  clopidogrel (PLAVIX) 75 MG tablet Take 75 mg by mouth daily.    Historical  Provider, MD  esomeprazole (NEXIUM) 40 MG capsule Take 40 mg by mouth daily before breakfast.      Historical Provider, MD  Fluticasone-Salmeterol (ADVAIR) 250-50 MCG/DOSE AEPB Inhale 1 puff into the lungs every 12 (twelve) hours.    Historical Provider, MD  furosemide (LASIX) 20 MG tablet take 1 tablet by mouth once daily 11/23/13   Abelino DerrickLuke K Kilroy, PA-C  HYDROcodone-acetaminophen (NORCO/VICODIN) 5-325 MG per tablet Take 1 tablet by mouth every 6 (six) hours as needed for pain. 5-300 MG    Historical Provider, MD  isosorbide mononitrate (IMDUR) 30 MG 24 hr tablet Take 30 mg by mouth daily.     Historical Provider, MD  levETIRAcetam (KEPPRA) 500 MG tablet Take 750 mg by mouth 2 (two) times daily. 1 1/2 tablets bid 06/28/12   Rebecca S  Tat, DO  LORazepam (ATIVAN) 1 MG tablet Take 1 mg by mouth every 8 (eight) hours.    Historical Provider, MD  Multiple Vitamin (MULTIVITAMIN WITH MINERALS) TABS Take 1 tablet by mouth daily.    Historical Provider, MD  nitroGLYCERIN (NITROSTAT) 0.4 MG SL tablet Place 0.4 mg under the tongue every 5 (five) minutes as needed for chest pain.    Historical Provider, MD  pravastatin (PRAVACHOL) 40 MG tablet Take 40 mg by mouth daily.    Historical Provider, MD  pregabalin (LYRICA) 75 MG capsule Take 75 mg by mouth at bedtime.     Historical Provider, MD  sertraline (ZOLOFT) 100 MG tablet Take 100 mg by mouth daily.     Historical Provider, MD  tiotropium (SPIRIVA) 18 MCG inhalation capsule Place 18 mcg into inhaler and inhale daily.    Historical Provider, MD   BP 139/88  Pulse 97  Temp(Src) 98.1 F (36.7 C) (Oral)  Resp 20  SpO2 95% Physical Exam  Nursing note and vitals reviewed. Constitutional: She is oriented to person, place, and time. She appears well-developed and well-nourished. No distress.  HENT:  Head: Normocephalic.  Right Ear: External ear normal.  Left Ear: External ear normal.  Nose: Nose normal.  Mouth/Throat: Oropharynx is clear and moist. No oropharyngeal exudate.  Eyes: Conjunctivae and EOM are normal. Pupils are equal, round, and reactive to light.  Neck: Normal range of motion. Neck supple.  Cardiovascular: Normal rate, regular rhythm and normal heart sounds.   Pulmonary/Chest: Effort normal and breath sounds normal. No respiratory distress. She has no wheezes. She has no rales.  Abdominal: Soft. Bowel sounds are normal. She exhibits no distension. There is no tenderness. There is no rebound.  Musculoskeletal: She exhibits no edema.  Neurological: She is alert and oriented to person, place, and time. No cranial nerve deficit. Coordination normal.  Skin: Skin is warm and dry.  Psychiatric: She has a normal mood and affect. Her behavior is normal.    ED Course   Procedures (including critical care time) Labs Review Labs Reviewed  CBC - Abnormal; Notable for the following:    Hemoglobin 11.5 (*)    HCT 34.1 (*)    All other components within normal limits  COMPREHENSIVE METABOLIC PANEL - Abnormal; Notable for the following:    Sodium 132 (*)    Chloride 95 (*)    Glucose, Bld 116 (*)    Creatinine, Ser 1.27 (*)    Total Bilirubin 0.2 (*)    GFR calc non Af Amer 37 (*)    GFR calc Af Amer 43 (*)    All other components within normal limits  CBG MONITORING, ED -  Abnormal; Notable for the following:    Glucose-Capillary 120 (*)    All other components within normal limits  URINALYSIS, ROUTINE W REFLEX MICROSCOPIC  I-STAT TROPOININ, ED  I-STAT CG4 LACTIC ACID, ED    Imaging Review Dg Chest 2 View  02/19/2014   CLINICAL DATA:  Altered mental status with weakness and nausea and vomiting  EXAM: CHEST  2 VIEW  COMPARISON:  PA and lateral chest x-ray of March 13, 2012  FINDINGS: The lungs are adequately inflated. There are coarse lung markings anteriorly in the left lower lobe. There small hiatal hernia. The heart is top-normal in size. The pulmonary vascularity is not engorged. The bony thorax is unremarkable.  IMPRESSION: 1. Left lower lobe atelectasis or pneumonia. 2. No evidence of CHF. 3. Small hiatal hernia.   Electronically Signed   By: David  [place]   On: 02/19/2014 21:29     EKG Interpretation   Date/Time:  Wednesday February 19 2014 19:06:40 EDT Ventricular Rate:  93 PR Interval:  151 QRS Duration: 71 QT Interval:  369 QTC Calculation: 459 R Axis:   -11 Text Interpretation:  Sinus rhythm Low voltage, precordial leads  Borderline T abnormalities, anterior leads No significant change since  last tracing Confirmed by ALLEN  MD, ANTHONY ([id number]) on 02/19/2014 10:35:15  PM      MDM   Final diagnoses:  Anxiety  Weakness  CAP (community acquired pneumonia)    pt with non specific complaints including increased anxiety and depression  and generalized weakness. No pain anywhere. Poor appetite. Will get labs, CXR, UA, monitor.   10:48 PM Pt had one episode of emesis, abdomen reasessed, soft, non tender. Pt reports burning in her throat. She received zofran and GI cocktail and her symptoms resolved. Her labs are reassuring. CXR showed CAP. Will start on zithromax.    Question whether recent switch to celexa from zoloft has worsened pt's symptoms. Advised her son that she will need close follow up with PCP. i am unsure why she was switched and will not be switching her medications in ER. Pt is agreeable with d/c home. Her VS are all normal. She currently has no complaints.    Filed Vitals:   02/19/14 2055 02/19/14 2100 02/19/14 2150 02/19/14 2233  BP: 140/67 145/61 159/87 135/61  Pulse: 64 65 78 76  Temp:      TempSrc:      Resp: 13 11 20 16   SpO2: 95% 95% 98% 94%       Lottie Mussel, PA-C 02/19/14 2338

## 2014-02-20 NOTE — ED Provider Notes (Signed)
Medical screening examination/treatment/procedure(s) were performed by non-physician practitioner and as supervising physician I was immediately available for consultation/collaboration.   EKG Interpretation   Date/Time:  Wednesday February 19 2014 19:06:40 EDT Ventricular Rate:  93 PR Interval:  151 QRS Duration: 71 QT Interval:  369 QTC Calculation: 459 R Axis:   -11 Text Interpretation:  Sinus rhythm Low voltage, precordial leads  Borderline T abnormalities, anterior leads No significant change since  last tracing Confirmed by Freida BusmanALLEN  MD, Rayan Dyal ([id number]) on 02/19/2014 10:35:15  PM       Toy BakerAnthony T Aleem Elza, MD 02/20/14 1245

## 2014-02-20 NOTE — ED Notes (Addendum)
Attempted to call son for ride but no answer.

## 2014-04-08 ENCOUNTER — Emergency Department (HOSPITAL_COMMUNITY): Payer: Medicare HMO

## 2014-04-08 ENCOUNTER — Encounter (HOSPITAL_COMMUNITY): Payer: Self-pay | Admitting: Emergency Medicine

## 2014-04-08 ENCOUNTER — Observation Stay (HOSPITAL_COMMUNITY)
Admission: EM | Admit: 2014-04-08 | Discharge: 2014-04-09 | Disposition: A | Discharge status: Home / Self Care | Payer: Medicare HMO | Attending: Internal Medicine | Admitting: Internal Medicine

## 2014-04-08 DIAGNOSIS — I5032 Chronic diastolic (congestive) heart failure: Secondary | ICD-10-CM | POA: Diagnosis present

## 2014-04-08 DIAGNOSIS — R42 Dizziness and giddiness: Secondary | ICD-10-CM | POA: Diagnosis not present

## 2014-04-08 DIAGNOSIS — E785 Hyperlipidemia, unspecified: Secondary | ICD-10-CM | POA: Diagnosis present

## 2014-04-08 DIAGNOSIS — F411 Generalized anxiety disorder: Secondary | ICD-10-CM

## 2014-04-08 DIAGNOSIS — R079 Chest pain, unspecified: Secondary | ICD-10-CM | POA: Diagnosis present

## 2014-04-08 DIAGNOSIS — R5381 Other malaise: Secondary | ICD-10-CM | POA: Insufficient documentation

## 2014-04-08 DIAGNOSIS — Z87891 Personal history of nicotine dependence: Secondary | ICD-10-CM | POA: Insufficient documentation

## 2014-04-08 DIAGNOSIS — Z9889 Other specified postprocedural states: Secondary | ICD-10-CM | POA: Diagnosis not present

## 2014-04-08 DIAGNOSIS — K219 Gastro-esophageal reflux disease without esophagitis: Secondary | ICD-10-CM | POA: Diagnosis present

## 2014-04-08 DIAGNOSIS — I2510 Atherosclerotic heart disease of native coronary artery without angina pectoris: Secondary | ICD-10-CM | POA: Diagnosis not present

## 2014-04-08 DIAGNOSIS — Z8701 Personal history of pneumonia (recurrent): Secondary | ICD-10-CM | POA: Diagnosis not present

## 2014-04-08 DIAGNOSIS — Z7902 Long term (current) use of antithrombotics/antiplatelets: Secondary | ICD-10-CM | POA: Diagnosis not present

## 2014-04-08 DIAGNOSIS — F419 Anxiety disorder, unspecified: Secondary | ICD-10-CM | POA: Diagnosis present

## 2014-04-08 DIAGNOSIS — I259 Chronic ischemic heart disease, unspecified: Secondary | ICD-10-CM | POA: Diagnosis not present

## 2014-04-08 DIAGNOSIS — Z79899 Other long term (current) drug therapy: Secondary | ICD-10-CM | POA: Insufficient documentation

## 2014-04-08 DIAGNOSIS — R110 Nausea: Secondary | ICD-10-CM | POA: Diagnosis not present

## 2014-04-08 DIAGNOSIS — R5383 Other fatigue: Secondary | ICD-10-CM

## 2014-04-08 DIAGNOSIS — R413 Other amnesia: Secondary | ICD-10-CM | POA: Insufficient documentation

## 2014-04-08 DIAGNOSIS — Z8742 Personal history of other diseases of the female genital tract: Secondary | ICD-10-CM | POA: Diagnosis not present

## 2014-04-08 DIAGNOSIS — G40909 Epilepsy, unspecified, not intractable, without status epilepticus: Secondary | ICD-10-CM | POA: Insufficient documentation

## 2014-04-08 DIAGNOSIS — D649 Anemia, unspecified: Secondary | ICD-10-CM | POA: Diagnosis not present

## 2014-04-08 DIAGNOSIS — I10 Essential (primary) hypertension: Secondary | ICD-10-CM | POA: Diagnosis present

## 2014-04-08 DIAGNOSIS — J45909 Unspecified asthma, uncomplicated: Secondary | ICD-10-CM

## 2014-04-08 DIAGNOSIS — R531 Weakness: Secondary | ICD-10-CM

## 2014-04-08 DIAGNOSIS — G3184 Mild cognitive impairment, so stated: Secondary | ICD-10-CM | POA: Diagnosis present

## 2014-04-08 LAB — COMPREHENSIVE METABOLIC PANEL
ALT: 10 U/L (ref 0–35)
AST: 19 U/L (ref 0–37)
Albumin: 3.8 g/dL (ref 3.5–5.2)
Alkaline Phosphatase: 61 U/L (ref 39–117)
Anion gap: 14 (ref 5–15)
BUN: 15 mg/dL (ref 6–23)
CALCIUM: 9.6 mg/dL (ref 8.4–10.5)
CO2: 25 mEq/L (ref 19–32)
Chloride: 101 mEq/L (ref 96–112)
Creatinine, Ser: 1.38 mg/dL — ABNORMAL HIGH (ref 0.50–1.10)
GFR calc non Af Amer: 34 mL/min — ABNORMAL LOW (ref >90)
GFR, EST AFRICAN AMERICAN: 39 mL/min — AB (ref >90)
GLUCOSE: 104 mg/dL — AB (ref 70–99)
Potassium: 4.1 mEq/L (ref 3.7–5.3)
SODIUM: 140 meq/L (ref 137–147)
TOTAL PROTEIN: 6.9 g/dL (ref 6.0–8.3)
Total Bilirubin: 0.3 mg/dL (ref 0.3–1.2)

## 2014-04-08 LAB — URINALYSIS, ROUTINE W REFLEX MICROSCOPIC
BILIRUBIN URINE: NEGATIVE
Glucose, UA: NEGATIVE mg/dL
Hgb urine dipstick: NEGATIVE
Ketones, ur: NEGATIVE mg/dL
Nitrite: NEGATIVE
Protein, ur: NEGATIVE mg/dL
SPECIFIC GRAVITY, URINE: 1.019 (ref 1.005–1.030)
Urobilinogen, UA: 0.2 mg/dL (ref 0.0–1.0)
pH: 6 (ref 5.0–8.0)

## 2014-04-08 LAB — CBG MONITORING, ED: Glucose-Capillary: 97 mg/dL (ref 70–99)

## 2014-04-08 LAB — CBC WITH DIFFERENTIAL/PLATELET
Basophils Absolute: 0 10*3/uL (ref 0.0–0.1)
Basophils Relative: 0 % (ref 0–1)
Eosinophils Absolute: 0.2 10*3/uL (ref 0.0–0.7)
Eosinophils Relative: 3 % (ref 0–5)
HEMATOCRIT: 34 % — AB (ref 36.0–46.0)
HEMOGLOBIN: 11.3 g/dL — AB (ref 12.0–15.0)
LYMPHS ABS: 2.1 10*3/uL (ref 0.7–4.0)
LYMPHS PCT: 30 % (ref 12–46)
MCH: 30.1 pg (ref 26.0–34.0)
MCHC: 33.2 g/dL (ref 30.0–36.0)
MCV: 90.7 fL (ref 78.0–100.0)
MONO ABS: 0.7 10*3/uL (ref 0.1–1.0)
MONOS PCT: 10 % (ref 3–12)
NEUTROS PCT: 57 % (ref 43–77)
Neutro Abs: 4 10*3/uL (ref 1.7–7.7)
Platelets: 280 10*3/uL (ref 150–400)
RBC: 3.75 MIL/uL — AB (ref 3.87–5.11)
RDW: 13 % (ref 11.5–15.5)
WBC: 6.9 10*3/uL (ref 4.0–10.5)

## 2014-04-08 LAB — I-STAT TROPONIN, ED: Troponin i, poc: 0 ng/mL (ref 0.00–0.08)

## 2014-04-08 LAB — PROTIME-INR
INR: 1.05 (ref 0.00–1.49)
Prothrombin Time: 13.7 seconds (ref 11.6–15.2)

## 2014-04-08 LAB — URINE MICROSCOPIC-ADD ON

## 2014-04-08 LAB — TROPONIN I: Troponin I: <0.3 ng/mL (ref <0.30)

## 2014-04-08 LAB — TSH: TSH: 1.96 u[IU]/mL (ref 0.350–4.500)

## 2014-04-08 LAB — APTT: aPTT: 26 seconds (ref 24–37)

## 2014-04-08 MED ORDER — SODIUM CHLORIDE 0.9 % IV BOLUS (SEPSIS)
1000.0000 mL | Freq: Once | INTRAVENOUS | Status: AC
  Administered 2014-04-08: 1000 mL via INTRAVENOUS

## 2014-04-08 MED ORDER — HEPARIN SODIUM (PORCINE) 5000 UNIT/ML IJ SOLN
5000.0000 [IU] | Freq: Three times a day (TID) | INTRAMUSCULAR | Status: DC
  Filled 2014-04-08 (×3): qty 1

## 2014-04-08 MED ORDER — SODIUM CHLORIDE 0.9 % IV SOLN
INTRAVENOUS | Status: AC
  Administered 2014-04-08: 18:00:00 via INTRAVENOUS

## 2014-04-08 MED ORDER — TIOTROPIUM BROMIDE MONOHYDRATE 18 MCG IN CAPS
18.0000 ug | ORAL_CAPSULE | Freq: Every day | RESPIRATORY_TRACT | Status: DC
  Administered 2014-04-08: 18 ug via RESPIRATORY_TRACT
  Filled 2014-04-08: qty 5

## 2014-04-08 MED ORDER — ALBUTEROL SULFATE (2.5 MG/3ML) 0.083% IN NEBU: 2.5000 mg | INHALATION_SOLUTION | Freq: Four times a day (QID) | RESPIRATORY_TRACT | Status: DC | PRN

## 2014-04-08 MED ORDER — MORPHINE SULFATE 2 MG/ML IJ SOLN: 1.0000 mg | INTRAMUSCULAR | Status: DC | PRN

## 2014-04-08 MED ORDER — TRAMADOL HCL 50 MG PO TABS
50.0000 mg | ORAL_TABLET | Freq: Every day | ORAL | Status: DC
  Administered 2014-04-08: 50 mg via ORAL
  Filled 2014-04-08: qty 1

## 2014-04-08 MED ORDER — ACETAMINOPHEN 325 MG PO TABS: 650.0000 mg | ORAL_TABLET | Freq: Four times a day (QID) | ORAL | Status: DC | PRN

## 2014-04-08 MED ORDER — LORAZEPAM 0.5 MG PO TABS
0.5000 mg | ORAL_TABLET | Freq: Three times a day (TID) | ORAL | Status: DC | PRN
  Administered 2014-04-09: 0.5 mg via ORAL
  Filled 2014-04-08: qty 1

## 2014-04-08 MED ORDER — PANTOPRAZOLE SODIUM 40 MG PO TBEC
40.0000 mg | DELAYED_RELEASE_TABLET | Freq: Every day | ORAL | Status: DC
  Administered 2014-04-09: 40 mg via ORAL
  Filled 2014-04-08: qty 1

## 2014-04-08 MED ORDER — CLOPIDOGREL BISULFATE 75 MG PO TABS
75.0000 mg | ORAL_TABLET | Freq: Every day | ORAL | Status: DC
  Administered 2014-04-09: 75 mg via ORAL
  Filled 2014-04-08: qty 1

## 2014-04-08 MED ORDER — SODIUM CHLORIDE 0.9 % IJ SOLN: 3.0000 mL | Freq: Two times a day (BID) | INTRAMUSCULAR | Status: DC

## 2014-04-08 MED ORDER — ADULT MULTIVITAMIN W/MINERALS CH
1.0000 | ORAL_TABLET | Freq: Every day | ORAL | Status: DC
  Administered 2014-04-09: 1 via ORAL
  Filled 2014-04-08 (×2): qty 1

## 2014-04-08 MED ORDER — ISOSORBIDE MONONITRATE ER 30 MG PO TB24
30.0000 mg | ORAL_TABLET | Freq: Every day | ORAL | Status: DC
  Administered 2014-04-09: 30 mg via ORAL
  Filled 2014-04-08 (×2): qty 1

## 2014-04-08 MED ORDER — FUROSEMIDE 20 MG PO TABS
20.0000 mg | ORAL_TABLET | Freq: Every day | ORAL | Status: DC
  Filled 2014-04-08: qty 1

## 2014-04-08 MED ORDER — ACETAMINOPHEN 650 MG RE SUPP: 650.0000 mg | Freq: Four times a day (QID) | RECTAL | Status: DC | PRN

## 2014-04-08 MED ORDER — ONDANSETRON HCL 4 MG/2ML IJ SOLN
4.0000 mg | Freq: Four times a day (QID) | INTRAMUSCULAR | Status: DC | PRN
Start: 2014-04-08 — End: 2014-04-09

## 2014-04-08 MED ORDER — LEVETIRACETAM 750 MG PO TABS
750.0000 mg | ORAL_TABLET | Freq: Two times a day (BID) | ORAL | Status: DC
  Administered 2014-04-08 – 2014-04-09 (×2): 750 mg via ORAL
  Filled 2014-04-08 (×4): qty 1

## 2014-04-08 MED ORDER — SIMVASTATIN 20 MG PO TABS
20.0000 mg | ORAL_TABLET | Freq: Every day | ORAL | Status: DC
  Administered 2014-04-08: 20 mg via ORAL
  Filled 2014-04-08 (×2): qty 1

## 2014-04-08 MED ORDER — ONDANSETRON HCL 4 MG PO TABS: 4.0000 mg | ORAL_TABLET | Freq: Four times a day (QID) | ORAL | Status: DC | PRN

## 2014-04-08 MED ORDER — ASPIRIN EC 81 MG PO TBEC
81.0000 mg | DELAYED_RELEASE_TABLET | Freq: Every day | ORAL | Status: DC
  Filled 2014-04-08: qty 1

## 2014-04-08 NOTE — H&P (Signed)
Date: 04/08/2014               Patient Name:  [name] [name] MRN: [id number]  DOB: [birth year] Age / Sex: 80 y.o., female   PCP: [name], MD              Medical Service: Internal Medicine Teaching Service              Attending Physician: Dr. Burns Spain, MD    First Contact: Enis Gash, MS3 Pager: [phone]  Second Contact: Dr. [name], MD Pager: [phone]            After Hours (After 5p/  First Contact Pager: [phone]  weekends / holidays): Second Contact Pager: [phone]   Chief Complaint: "chest pain"  History of Present Illness: [name] [name] is an 80 yo female with hx of CAD, COPD, diastolic CHF (55% EF), HTN, hyperlipidemia, and seizures who has normal 12-lead EKG, unremarkable doppler carotid in July 2013, and negative Lexiscan stress test in May 2011 and presents with chest pain following a seizure.   Chest pain was localized to the upper left sternal border and described as a "pressing" pain that started immediately after a seizure at 8 AM this morning.  Pain rated 8/10 in severity, did not radiate, and resolved after 3 hrs.  Chest pain was associated with dizziness that patient described as "about to pass out," especially upon standing.  Chest pain was not associated with diaphoresis, palpitations, N/V, numbness, headache, edema and did not worsen or improve with movement, rest, or change in position.   Per her son's report, the patient's seizure was characterized by a "staring spell" that lasted approximately 15 minutes while she was seated at the table and was followed by a confused post-ictal state.  Pt did not fall or hit her head, but may have had some tongue-biting.  No jerking or tonic-clonic activity was noted.  Pt is currently being managed on Keppra 500 mg BID, and her last seizure was 3 mths ago.  Pt lives alone and manages her own medications, but it is unclear if there are medication compliance issues, as pt cannot recall current meds or doses.      Pt also reports dyspnea, triggered by talking, that has worsened over the last month.  Pt feels "dizzy" and syncopal during these episodes, but does not recall losing consciousness while alone at home.  Lying down and sleeping relieve the dizziness and dyspnea.  Pt reports decrease in appetite for the last year following her son's death.  On average, pt takes in 5-6 glasses of water, 5 cups of coffee, and one meal each day.  Pt's last meal was yesterday morning and last BM was yesterday.   Denies headaches, trauma/injuries, recent changes in vision or hearing, fever, chills, edema, diarrhea, dysuria, bruises, or pain.  Recent ED visit on 7/1 with dx of CAP, but resolved after course of levaquin.    TIMI Risk Score for UA/NSTEMI: 3 (13% risk) HEART Score: 5 (12-16.6% risk of MACE)  Risk factors for heart disease include HTN, hyperlipidemia, former smoker (40-year pack history, quit 2011), and age.    Meds: Current Facility-Administered Medications  Medication Dose Route Frequency Provider Last Rate Last Dose  . acetaminophen (TYLENOL) tablet 650 mg  650 mg Oral Q6H PRN Ky Barban, MD       Or  . acetaminophen (TYLENOL) suppository 650 mg  650 mg Rectal Q6H PRN Ky Barban, MD      .  albuterol (PROVENTIL) (2.5 MG/3ML) 0.083% nebulizer solution 2.5 mg  2.5 mg Nebulization Q6H PRN Ky Barban, MD      . Melene Muller ON 04/09/2014] aspirin EC tablet 81 mg  81 mg Oral Daily Ky Barban, MD      . clopidogrel (PLAVIX) tablet 75 mg  75 mg Oral Daily Ky Barban, MD      . furosemide (LASIX) tablet 20 mg  20 mg Oral Daily Ky Barban, MD      . heparin injection 5,000 Units  5,000 Units Subcutaneous 3 times per day Ky Barban, MD      . isosorbide mononitrate (IMDUR) 24 hr tablet 30 mg  30 mg Oral Daily Ky Barban, MD      . levETIRAcetam (KEPPRA) tablet 750 mg  750 mg Oral BID Ky Barban, MD      . LORazepam (ATIVAN) tablet 0.5  mg  0.5 mg Oral Q8H PRN Ky Barban, MD      . morphine 2 MG/ML injection 1 mg  1 mg Intravenous Q4H PRN Ky Barban, MD      . multivitamin with minerals tablet 1 tablet  1 tablet Oral Daily Ky Barban, MD      . ondansetron (ZOFRAN) tablet 4 mg  4 mg Oral Q6H PRN Ky Barban, MD       Or  . ondansetron (ZOFRAN) injection 4 mg  4 mg Intravenous Q6H PRN Ky Barban, MD      . pantoprazole (PROTONIX) EC tablet 40 mg  40 mg Oral Daily Ky Barban, MD      . simvastatin (ZOCOR) tablet 20 mg  20 mg Oral q1800 Ky Barban, MD      . sodium chloride 0.9 % injection 3 mL  3 mL Intravenous Q12H Ky Barban, MD      . tiotropium [facility]) inhalation capsule 18 mcg  18 mcg Inhalation Daily Ky Barban, MD      . traMADol Janean Sark) tablet 50 mg  50 mg Oral QHS Ky Barban, MD        Allergies: Allergies as of 04/08/2014 - Review Complete 04/08/2014  Allergen Reaction Noted  . Codeine Nausea And Vomiting 12/01/2010   Past Medical History  Diagnosis Date  . SOB (shortness of breath)   . Fatigue   . Anxiety   . Seizure   . Dizziness   . Hot flashes   . Ischemic heart disease   . Hypertension   . Hyperlipidemia   . Coronary artery disease     [name]  . Anemia   . Syncope and collapse     RECURRENT, RELATED TO HYPONATREMIA, NARCOTICS, AND BRADYCARDIA  . Tobacco abuse     PAST  . Memory disorder   . Pneumonia   . Flu syndrome january 2013   Past Surgical History  Procedure Laterality Date  . Cardiac catheterization  2004  . Abdominal hysterectomy      early 1970's for fibroid   Family History  Problem Relation Age of Onset  . Heart attack Father   . Heart failure Mother   . CAD Father   . Congestive Heart Failure Mother   . Other Brother     cerebral hemmorhage  . Coronary artery disease Sister    History   Social History  . Marital Status: Widowed    Spouse Name: N/A    Number of  Children: N/A  .  Years of Education: N/A   Occupational History  . Not on file.   Social History Main Topics  . Smoking status: Former Smoker -- 1.00 packs/day for 40 years    Quit date: 08/23/1999  . Smokeless tobacco: Never Used  . Alcohol Use: No  . Drug Use: No  . Sexual Activity: Not on file   Other Topics Concern  . Not on file   Social History Narrative   Housewife   2 Children   Widowed    Review of Systems: General: No fever, chills, or sweats.  HEENT: Endorses reflux/heart burn. No vertigo, vision changes, or eye pain. No neck stiffness.  Cardiovascular: See HPI Respiratory: No cough, hemoptysis, or wheeze. GI: No N/V, diarrhea, blood per rectum, abdominal pain, or change in bowel habits. Genitourinary: No dysuria.  MSK: Joint pain persistent, but stable.  Neuro: No HA or recent memory loss. No numbness or tingling.  Psychiatric: Decreased appetite and anhedonia following son's death 1 yr ago  Physical Exam: Blood pressure 125/67, pulse 88, temperature 98.9 F (37.2 C), temperature source Oral, resp. rate 20, height 5\' 2"  (1.575 m), weight 57.652 kg (127 lb 1.6 oz), SpO2 95.00%. General: elderly female in NAD, normal pallor Head: Normocephalic and atraumatic.  Lungs: Crackles in right lower lobe.  No wheezes.  CV: Heart regular S1/S2, no S3/S4. No peripheral edema. No carotid bruits. +2 pedal pulses.  No chest pain with palpation of left chest.   Abdomen: Soft, nontender, no distention. Normal bowel sounds.  Neurologic: Alert and oriented x 3. PERRL. No pronator drift. MSK: Strength 5/5 in upper and lower extremities. Chest pain not elicited by moving arms. No erythema, bruising at joints.  Psych: Normal affect.  Extremities: No clubbing or cyanosis. No rashes, bruises, or lesions.    Lab results: Basic Metabolic Panel:  Recent Labs  46/96/2908/18/15 1040  NA 140  K 4.1  CL 101  CO2 25  GLUCOSE 104*  BUN 15  CREATININE 1.38*  CALCIUM 9.6   Liver  Function Tests:  Recent Labs  04/08/14 1040  AST 19  ALT 10  ALKPHOS 61  BILITOT 0.3  PROT 6.9  ALBUMIN 3.8   No results found for this basename: LIPASE, AMYLASE,  in the last 72 hours No results found for this basename: AMMONIA,  in the last 72 hours CBC:  Recent Labs  04/08/14 1040  WBC 6.9  NEUTROABS 4.0  HGB 11.3*  HCT 34.0*  MCV 90.7  PLT 280   Cardiac Enzymes: No results found for this basename: CKTOTAL, CKMB, CKMBINDEX, TROPONINI,  in the last 72 hours BNP: No results found for this basename: PROBNP,  in the last 72 hours D-Dimer: No results found for this basename: DDIMER,  in the last 72 hours CBG:  Recent Labs  04/08/14 1035  GLUCAP 97   Hemoglobin A1C: No results found for this basename: HGBA1C,  in the last 72 hours Fasting Lipid Panel: No results found for this basename: CHOL, HDL, LDLCALC, TRIG, CHOLHDL, LDLDIRECT,  in the last 72 hours Thyroid Function Tests: No results found for this basename: TSH, T4TOTAL, FREET4, T3FREE, THYROIDAB,  in the last 72 hours Anemia Panel: No results found for this basename: VITAMINB12, FOLATE, FERRITIN, TIBC, IRON, RETICCTPCT,  in the last 72 hours Coagulation:  Recent Labs  04/08/14 1040  LABPROT 13.7  INR 1.05   Urine Drug Screen: Drugs of Abuse  No results found for this basename: labopia,  cocainscrnur,  labbenz,  amphetmu,  thcu,  labbarb    Alcohol Level: No results found for this basename: ETH,  in the last 72 hours Urinalysis:  Recent Labs  04/08/14 1132  COLORURINE YELLOW  LABSPEC 1.019  PHURINE 6.0  GLUCOSEU NEGATIVE  HGBUR NEGATIVE  BILIRUBINUR NEGATIVE  KETONESUR NEGATIVE  PROTEINUR NEGATIVE  UROBILINOGEN 0.2  NITRITE NEGATIVE  LEUKOCYTESUR SMALL*    Imaging results:  Dg Chest 2 View  04/08/2014   CLINICAL DATA:  Weakness, history of coronary artery disease  EXAM: CHEST  2 VIEW  COMPARISON:  PA and lateral chest of February 19, 2014  FINDINGS: The lungs are hyperinflated with  hemidiaphragm flattening. There is no focal infiltrate. The heart and pulmonary vascularity are within the limits of normal. The mediastinum is normal in width. There is no pleural effusion or pneumothorax. There is a hiatal hernia. The bony thorax exhibits no acute abnormality. The patient has undergone previous kyphoplasty of the body of T12.  IMPRESSION: COPD without evidence of CHF, pneumonia, nor other acute cardiopulmonary abnormality.   Electronically Signed   By: David  [place]   On: 04/08/2014 11:22   Ct Head Wo Contrast  04/08/2014   CLINICAL DATA:  80 year old with seizure.  Generalized weakness.  EXAM: CT HEAD WITHOUT CONTRAST  TECHNIQUE: Contiguous axial images were obtained from the base of the skull through the vertex without contrast.  COMPARISON:  03/12/2012  FINDINGS: No evidence for acute hemorrhage, mass lesion, midline shift, hydrocephalus or large infarct. There is stable cerebral atrophy. Low-density in the periventricular white matter suggests chronic small vessel ischemic changes. A polyp or retention cyst in the right maxillary sinus. A mildly displaced left nasal bone fracture.  IMPRESSION: No acute intracranial abnormality.  Stable atrophy and evidence for chronic small vessel ischemic changes.  Left nasal bone fracture of unknown age.   Electronically Signed   By: Richarda Overlie M.D.   On: 04/08/2014 10:57    Other results: EKG: normal EKG, normal sinus rhythm, unchanged from previous tracings.  Assessment & Plan by Problem: A: Ms. Schutter is a 80 yo female with PMH significant for CAD, COPD, diastolic CHF (55% EF), HTN, hyperlipidemia, and seizures who presented with 8/10 left sternal "pressing" chest pain immediately following a seizure and was found to have normal 12-lead EKG on admission, unremarkable doppler carotid in July 2013, and negative Lexiscan stress test in May 2011.   Chest pain: Pt's  age, hyperlipidemia, and HTN put her at an increased risk for MI.  However,  presentation is atypical for a MI, given no N/V, palpitations, diaphoresis, numbness or radiation of pain.  Pt's normal EKG and initial normal troponin also make MI less likely.  However, will repeat EKG tomorrow and trend tropinin x3 q6 hrs to ensure MI is not missed. Pt should also receive aspirin 81 mg. Unstable angina is also on the differential given hx of CAD and presentation of chest pain that occurs at rest and lasts more than 15 minutes.  Pt with unstable angina would also have nl EKG with nl serum troponin.  Trauma/injury was also considered in the differential given that pt had seizure before chest pain; however, pt's son reports that pt did not fall and remained seated during the seizure.  Pt also lacks bruises and tenderness and denies any other pain.  Head CT showed no evidence for acute hemorrhage, mass lesion, midline shift, or other intracranial abnormality. GERD should also be considered, though GERD usually presents as substernal burning sensation most commonly experienced after meals.  Pt's presentation, including no recent meals, no regurgitation, and pain that presented in upper left sternal border, make this dx less likely.  Respiratory etiologies also considered, including pneumonia given crackles in RLL on physical exam.  CXR showed hyperinflated lungs, but no evidence of pleural effusion, pneumonia, pneumothorax or CHF.  These findings plus lack of fever and cough make pneumonia less likely cause of pain.  PE considered, but less likely since pain is not pleuritic in nature, pt is not tachycardic and pt is afebrile (Wells criteria: 1.3% chance of PE). Plan: -2D ECHO with contrast to assess for cardiac etiology -Monitor vital signs q4 hrs given dizziness -Trend troponins x3, q6 hrs -Urine drug screen -Repeat EKG tomorrow to assess cardiac changes -Repeat BMP and CBC tomorrow 8/19  -Start Asprin 81 mg -Continue clopidogrel, 75 mg -Continue aspirin, 81 mg  Dizziness: Dizziness is  most likely due to dehydration as orthostatic vital signs show decrease of more than from sitting to standing position (Lying: 129/84, Sitting: 135/55, Standing: 109/57).  BUN is wnl (15). Pt reports last meal was yesterday morning and denies any food or liquid intake today.  Pt typically drinks 5-6 glasses of water and 5 cups of coffee each day.  Pt advised to reduce coffee intake, as may be contributing to dehydration.  -Give Nl Saline for potential dehydration -Hold Lasix given dizziness   Seizures: Last seizure prior to most recent episode was 3 mths ago per son's report.  Pt's current regimen keeps her well-controlled. -Measure Keppra serum level -Continue Keppra 750 mg BID per neurology -Neuro is following.   CAD/Diastolic CHF:   Patient does not clinically appear to be using increased respiratory effort.  o ECHO on 01/24/14 showed grade 1 diastolic dysfunction and normal systolic function with estimated ejection fraction in the range of 55% to 60% and mildly elevated PASP.  At that time, some mild focal basal hypertrophy of septum, but no wall motion abnormalities.  Hx of CAD dates back to 2004 at which time she had a cardiac catheterization. She had normal left ventricular function with moderately severe ostial left anterior descending disease with proximal disease present, moderate intermediate disease of 50-60% narrowing, mild proximal right coronary artery disease and minimal, if any, left circumflex disease.   Anxiety: Continue Ativan 0.5 mg PRN for anxiety  Hypertension: BP within target treatment level per JNC 8 guidelines (BP Sitting: 135/55). -Will hold Lasix given dizziness and no peripheral edema.      Hyperlipidemia: Continue Home simvastatin, 20 mg.    Anemia: Patient's anemia has been longstanding. Today her hemoglobin is 11.3, which is at her baseline.   -Will continue to monitor Hg and Hct.   GERD (gastroesophageal reflux disease): Pt placed on heart diet. -Continue  Protonix EC 40 mg for reflux.   Diastolic CHF, chronic: Ejection fraction in the range of 55% to 60% on ECHO on 01/24/2014.   Asthma: Pt denies recent wheezing episodes.   -Continue home triotropium inhalation capsule 18 mcg.  This is a Psychologist, occupational Note.  The care of the patient was discussed with Dr. Elyn Peers and the assessment and plan was formulated with their assistance.  Please see their note for official documentation of the patient encounter.   Signed: [place] B Philis Doke, Med Student 04/08/2014, 5:03 PM

## 2014-04-08 NOTE — ED Provider Notes (Signed)
CSN: [id number]     Arrival date & time 04/08/14  0957 History   First MD Initiated Contact with Patient 04/08/14 1002     Chief Complaint  Patient presents with  . Seizures  . Chest Pain   [name] [name] is an 80 yo caucasian F w/PMH of HTN, HLD, CAD, and seizures who presents w/ seizure this morning. Patient's son endorses the patient had spell of staring off into space for short amount of time. Patient endorses she hasn't had a seizure for over a year. Patient does endorse that she's been ill for about 4 days, feeling very weak. Has not had any nausea or vomiting prior to today. Upon arrival to the ED patient endorses she has some chest pain at the present for several hours this morning, located substernally, no radiation, associated with nausea and sweating. Patient says this pain is an 8/10 in severity.  She denies SOB, fever, chills, Vomiting, diarrhea, constipation, hematemesis, dysuria, hematuria, sick contacts, or recent travel.   (Consider location/radiation/quality/duration/timing/severity/associated sxs/prior Treatment) Patient is a 80 y.o. female presenting with seizures and chest pain. The history is provided by the patient.  Seizures Seizure type:  Petit mal Preceding symptoms: nausea   Preceding symptoms: no sensation of an aura present, no dizziness, no panic and no vision change   Initial focality:  None Episode characteristics: no abnormal movements, no confusion, no disorientation, no generalized shaking, no incontinence, no tongue biting and responsive   Postictal symptoms: no confusion and no somnolence   Return to baseline: yes   Severity:  Mild Timing:  Once Context: not alcohol withdrawal and not change in medication   PTA treatment:  None History of seizures: yes   Chest Pain Pain location:  Substernal area Pain quality: aching   Pain radiates to:  Does not radiate Pain radiates to the back: no   Pain severity:  Moderate Onset quality:  Sudden Timing:   Constant Chronicity:  New Context: at rest   Context: not breathing   Associated symptoms: no abdominal pain, no dizziness, no fever, no headache, no nausea, no palpitations, no shortness of breath and not vomiting     Past Medical History  Diagnosis Date  . SOB (shortness of breath)   . Fatigue   . Anxiety   . Seizure   . Dizziness   . Hot flashes   . Ischemic heart disease   . Hypertension   . Hyperlipidemia   . Coronary artery disease     [name] [name]  . Anemia   . Syncope and collapse     RECURRENT, RELATED TO HYPONATREMIA, NARCOTICS, AND BRADYCARDIA  . Tobacco abuse     PAST  . Memory disorder   . Pneumonia   . Flu syndrome january 2013   Past Surgical History  Procedure Laterality Date  . Cardiac catheterization  2004  . Abdominal hysterectomy      early 1970's for fibroid   Family History  Problem Relation Age of Onset  . Heart attack Father   . Heart failure Mother   . CAD Father   . Congestive Heart Failure Mother   . Other Brother     cerebral hemmorhage  . Coronary artery disease Sister    History  Substance Use Topics  . Smoking status: Former Smoker -- 1.00 packs/day for 40 years    Quit date: 08/23/1999  . Smokeless tobacco: Never Used  . Alcohol Use: No   OB History   Grav Para Term Preterm  Abortions TAB SAB Ect Mult Living                 Review of Systems  Unable to perform ROS Constitutional: Negative for fever and chills.  Respiratory: Negative for shortness of breath.   Cardiovascular: Positive for chest pain. Negative for palpitations and leg swelling.  Gastrointestinal: Negative for nausea, vomiting, abdominal pain, diarrhea, constipation and abdominal distention.  Genitourinary: Negative for dysuria, frequency, flank pain and decreased urine volume.  Neurological: Positive for seizures. Negative for dizziness, speech difficulty, light-headedness and headaches.  All other systems reviewed and are negative.     Allergies   Codeine  Home Medications   Prior to Admission medications   Medication Sig Start Date End Date Taking? Authorizing Provider  acetaminophen (TYLENOL) 500 MG tablet Take 1,000 mg by mouth every 6 (six) hours as needed. For pain.   Yes Historical Provider, MD  Calcium Carbonate-Vitamin D (CALCIUM 600 + D PO) Take 1 tablet by mouth daily.    Yes Historical Provider, MD  clopidogrel (PLAVIX) 75 MG tablet Take 75 mg by mouth daily.   Yes Historical Provider, MD  escitalopram (LEXAPRO) 10 MG tablet Take 10 mg by mouth daily.   Yes Historical Provider, MD  furosemide (LASIX) 20 MG tablet Take 20 mg by mouth daily.   Yes Historical Provider, MD  isosorbide mononitrate (IMDUR) 30 MG 24 hr tablet Take 30 mg by mouth daily.    Yes Historical Provider, MD  levETIRAcetam (KEPPRA) 500 MG tablet Take 750 mg by mouth every 12 (twelve) hours.  06/28/12  Yes Rebecca S Tat, DO  LORazepam (ATIVAN) 0.5 MG tablet Take 0.5 mg by mouth every 6 (six) hours as needed for anxiety.   Yes Historical Provider, MD  Multiple Vitamin (MULTIVITAMIN WITH MINERALS) TABS Take 1 tablet by mouth daily.   Yes Historical Provider, MD  nitroGLYCERIN (NITROSTAT) 0.4 MG SL tablet Place 0.4 mg under the tongue every 5 (five) minutes as needed for chest pain.   Yes Historical Provider, MD  pravastatin (PRAVACHOL) 40 MG tablet Take 40 mg by mouth daily.   Yes Historical Provider, MD  tiotropium (SPIRIVA) 18 MCG inhalation capsule Place 18 mcg into inhaler and inhale daily.   Yes Historical Provider, MD  traMADol (ULTRAM) 50 MG tablet Take 50 mg by mouth every 8 (eight) hours as needed (pain).    Yes Historical Provider, MD   BP 128/55  Pulse 60  Temp(Src) 97.8 F (36.6 C) (Oral)  Resp 14  SpO2 100% Physical Exam  Nursing note and vitals reviewed. Constitutional: She is oriented to person, place, and time. She appears well-developed and well-nourished. No distress.  HENT:  Head: Normocephalic and atraumatic.  Cardiovascular: Normal  rate, regular rhythm, normal heart sounds and intact distal pulses.  Exam reveals no gallop and no friction rub.   No murmur heard. Pulmonary/Chest: Effort normal and breath sounds normal. No respiratory distress. She has no wheezes. She has no rales. She exhibits no tenderness.  Abdominal: Soft. Bowel sounds are normal. She exhibits no distension and no mass. There is no tenderness. There is no rebound and no guarding.  Musculoskeletal: Normal range of motion. She exhibits no edema and no tenderness.  Lymphadenopathy:    She has no cervical adenopathy.  Neurological: She is alert and oriented to person, place, and time. No cranial nerve deficit. Coordination normal.  Skin: Skin is warm and dry. She is not diaphoretic.    ED Course  Procedures (including critical care  time) Labs Review Labs Reviewed  CBC WITH DIFFERENTIAL - Abnormal; Notable for the following:    RBC 3.75 (*)    Hemoglobin 11.3 (*)    HCT 34.0 (*)    All other components within normal limits  COMPREHENSIVE METABOLIC PANEL - Abnormal; Notable for the following:    Glucose, Bld 104 (*)    Creatinine, Ser 1.38 (*)    GFR calc non Af Amer 34 (*)    GFR calc Af Amer 39 (*)    All other components within normal limits  URINALYSIS, ROUTINE W REFLEX MICROSCOPIC - Abnormal; Notable for the following:    Leukocytes, UA SMALL (*)    All other components within normal limits  URINE MICROSCOPIC-ADD ON - Abnormal; Notable for the following:    Squamous Epithelial / LPF FEW (*)    Bacteria, UA FEW (*)    Casts HYALINE CASTS (*)    All other components within normal limits  PROTIME-INR  APTT  TSH  TROPONIN I  TROPONIN I  LEVETIRACETAM LEVEL  CBG MONITORING, ED  Rosezena Sensor, ED    Imaging Review Dg Chest 2 View  04/08/2014   CLINICAL DATA:  Weakness, history of coronary artery disease  EXAM: CHEST  2 VIEW  COMPARISON:  PA and lateral chest of February 19, 2014  FINDINGS: The lungs are hyperinflated with hemidiaphragm  flattening. There is no focal infiltrate. The heart and pulmonary vascularity are within the limits of normal. The mediastinum is normal in width. There is no pleural effusion or pneumothorax. There is a hiatal hernia. The bony thorax exhibits no acute abnormality. The patient has undergone previous kyphoplasty of the body of T12.  IMPRESSION: COPD without evidence of CHF, pneumonia, nor other acute cardiopulmonary abnormality.   Electronically Signed   By: David  [place]   On: 04/08/2014 11:22   Ct Head Wo Contrast  04/08/2014   CLINICAL DATA:  80 year old with seizure.  Generalized weakness.  EXAM: CT HEAD WITHOUT CONTRAST  TECHNIQUE: Contiguous axial images were obtained from the base of the skull through the vertex without contrast.  COMPARISON:  03/12/2012  FINDINGS: No evidence for acute hemorrhage, mass lesion, midline shift, hydrocephalus or large infarct. There is stable cerebral atrophy. Low-density in the periventricular white matter suggests chronic small vessel ischemic changes. A polyp or retention cyst in the right maxillary sinus. A mildly displaced left nasal bone fracture.  IMPRESSION: No acute intracranial abnormality.  Stable atrophy and evidence for chronic small vessel ischemic changes.  Left nasal bone fracture of unknown age.   Electronically Signed   By: Richarda Overlie M.D.   On: 04/08/2014 10:57     EKG Interpretation None      MDM   80 yo caucasian F with chest pain, dizziness, and seizure this morning. Please see HPI for details. On exam, pt in NAD, AFVSS. Chest pain reveals no tenderness palpation. Patient not currently diaphoretic. With known CAD, will work up chest pain. For weakness and seizure will obtain CBC, CMP as well as CT head, chest x-ray, and UA.  EKG shows NSR w/no signs of ischemia or arrhythmia, normal intervals.   Orthostatics +. Given 1 L NS. CBC shows stable Hgb. Initial trop undetectable. Chest pain now gone after several hours.   CT shows no acute  intracranial abnormality. CXR shows no sign of cardiopulmonary disease.   Labs wnl. Will admit to hospitalist for further evaluation and cardiac monitoring. Please see their note for further details regarding her hospital  course. While in ED, pt remained stable and required no acute interventions.   Final diagnoses:  Weakness  Chest pain, unspecified chest pain type    Pt was seen under the supervision of Dr. Jeraldine Loots.     Rachelle Hora, MD 04/08/14 [phone]

## 2014-04-08 NOTE — Consult Note (Signed)
NEURO HOSPITALIST CONSULT NOTE    Reason for Consult: Seizure ?  HPI:                                                                                                                                          [name] [name] is an 80 y.o. female who lives with her son in a apartment complex.  The son has never left his mother for more than one day.  He states his mother woke up and had not been up for more than 15 minutes.  She was sitting at the table with her cup of coffee when her son noted she had lost her color and started to star forward. She was not responsive to him and he actually had to keep his hand on her to keep her upright.  He slowly allowed her to go to the floor and called 911.  He denies eye deviation, bitten tongue, incontinence, heavy breathing.  He states she slowly came around. No TC activity was noted. Currently she is back to her baseline.   All previous charts note she was on 750 mg Keppra BID.  Resident notes state she was on 500 mg BID.  Patient states she was on 800 mg BID keppra.   Past Medical History  Diagnosis Date  . SOB (shortness of breath)   . Fatigue   . Anxiety   . Seizure   . Dizziness   . Hot flashes   . Ischemic heart disease   . Hypertension   . Hyperlipidemia   . Coronary artery disease     [name]  . Anemia   . Syncope and collapse     RECURRENT, RELATED TO HYPONATREMIA, NARCOTICS, AND BRADYCARDIA  . Tobacco abuse     PAST  . Memory disorder   . Pneumonia   . Flu syndrome january 2013    Past Surgical History  Procedure Laterality Date  . Cardiac catheterization  2004  . Abdominal hysterectomy      early 1970's for fibroid    Family History  Problem Relation Age of Onset  . Heart attack Father   . Heart failure Mother   . CAD Father   . Congestive Heart Failure Mother   . Other Brother     cerebral hemmorhage  . Coronary artery disease Sister      Social History:  reports that she quit smoking  about 14 years ago. She has never used smokeless tobacco. She reports that she does not drink alcohol or use illicit drugs.  Allergies  Allergen Reactions  . Codeine Nausea And Vomiting    MEDICATIONS:  Scheduled: . [START ON 04/09/2014] aspirin EC  81 mg Oral Daily  . clopidogrel  75 mg Oral Daily  . furosemide  20 mg Oral Daily  . heparin  5,000 Units Subcutaneous 3 times per day  . isosorbide mononitrate  30 mg Oral Daily  . levETIRAcetam  750 mg Oral BID  . multivitamin with minerals  1 tablet Oral Daily  . pantoprazole  40 mg Oral Daily  . simvastatin  20 mg Oral q1800  . sodium chloride  3 mL Intravenous Q12H  . tiotropium  18 mcg Inhalation Daily  . traMADol  50 mg Oral QHS     ROS:                                                                                                                                       History obtained from the patient  General ROS: negative for - chills, fatigue, fever, night sweats, weight gain or weight loss Psychological ROS: negative for - behavioral disorder, hallucinations, memory difficulties, mood swings or suicidal ideation Ophthalmic ROS: negative for - blurry vision, double vision, eye pain or loss of vision ENT ROS: negative for - epistaxis, nasal discharge, oral lesions, sore throat, tinnitus or vertigo Allergy and Immunology ROS: negative for - hives or itchy/watery eyes Hematological and Lymphatic ROS: negative for - bleeding problems, bruising or swollen lymph nodes Endocrine ROS: negative for - galactorrhea, hair pattern changes, polydipsia/polyuria or temperature intolerance Respiratory ROS: negative for - cough, hemoptysis, shortness of breath or wheezing Cardiovascular ROS: negative for - chest pain, dyspnea on exertion, edema or irregular heartbeat Gastrointestinal ROS: negative for - abdominal pain, diarrhea,  hematemesis, nausea/vomiting or stool incontinence Genito-Urinary ROS: negative for - dysuria, hematuria, incontinence or urinary frequency/urgency Musculoskeletal ROS: negative for - joint swelling or muscular weakness Neurological ROS: as noted in HPI Dermatological ROS: negative for rash and skin lesion changes   Blood pressure 120/54, pulse 69, temperature 98.9 F (37.2 C), temperature source Oral, resp. rate 20, SpO2 95.00%.   Neurologic Examination:                                                                                                      General: NAD Mental Status: Alert, oriented, thought content appropriate.  Speech fluent without evidence of aphasia.  Able to follow 3 step commands without difficulty. Cranial Nerves: II: Discs flat bilaterally; Visual fields grossly normal, pupils equal, round, reactive to light and accommodation III,IV, VI: ptosis not present, extra-ocular  motions intact bilaterally V,VII: smile symmetric, facial light touch sensation normal bilaterally VIII: hearing normal bilaterally IX,X: gag reflex present XI: bilateral shoulder shrug XII: midline tongue extension without atrophy or fasciculations  Motor: Right : Upper extremity   5/5    Left:     Upper extremity   5/5  Lower extremity   5/5     Lower extremity   5/5 Tone and bulk:normal tone throughout; no atrophy noted Sensory: Pinprick and light touch intact throughout, bilaterally Deep Tendon Reflexes:  1+ throughout  Plantars: Right: downgoing   Left: downgoing Cerebellar: normal finger-to-nose,  normal heel-to-shin test Gait: not tested CV: pulses palpable throughout    Lab Results: Basic Metabolic Panel:  Recent Labs Lab 04/08/14 1040  NA 140  K 4.1  CL 101  CO2 25  GLUCOSE 104*  BUN 15  CREATININE 1.38*  CALCIUM 9.6    Liver Function Tests:  Recent Labs Lab 04/08/14 1040  AST 19  ALT 10  ALKPHOS 61  BILITOT 0.3  PROT 6.9  ALBUMIN 3.8   No results  found for this basename: LIPASE, AMYLASE,  in the last 168 hours No results found for this basename: AMMONIA,  in the last 168 hours  CBC:  Recent Labs Lab 04/08/14 1040  WBC 6.9  NEUTROABS 4.0  HGB 11.3*  HCT 34.0*  MCV 90.7  PLT 280    Cardiac Enzymes: No results found for this basename: CKTOTAL, CKMB, CKMBINDEX, TROPONINI,  in the last 168 hours  Lipid Panel: No results found for this basename: CHOL, TRIG, HDL, CHOLHDL, VLDL, LDLCALC,  in the last 168 hours  CBG:  Recent Labs Lab 04/08/14 1035  GLUCAP 97    Microbiology: Results for orders placed during the hospital encounter of 05/31/09  CULTURE, BLOOD (ROUTINE X 2)     Status: None   Collection Time    05/31/09  5:58 PM      Result Value Ref Range Status   Specimen Description BLOOD LEFT ARM   Final   Special Requests BOTTLES DRAWN AEROBIC AND ANAEROBIC 10CC   Final   Culture NO GROWTH 5 DAYS   Final   Report Status 06/07/2009 FINAL   Final  CULTURE, BLOOD (ROUTINE X 2)     Status: None   Collection Time    05/31/09  6:09 PM      Result Value Ref Range Status   Specimen Description BLOOD LEFT ARM   Final   Special Requests BOTTLES DRAWN AEROBIC AND ANAEROBIC 10CC   Final   Culture NO GROWTH 5 DAYS   Final   Report Status 06/07/2009 FINAL   Final  CULTURE, SPUTUM-ASSESSMENT     Status: None   Collection Time    06/02/09  2:46 PM      Result Value Ref Range Status   Specimen Description SPUTUM   Final   Special Requests PT ON ROCEPHIN ZITHROMAX   Final   Sputum evaluation     Final   Value: MICROSCOPIC FINDINGS SUGGEST THAT THIS SPECIMEN IS NOT REPRESENTATIVE OF LOWER RESPIRATORY SECRETIONS. PLEASE RECOLLECT.     CALLED TO RN L.MOSS AT 1504 BY L.PITT 06/01/09.   Report Status 06/02/2009 FINAL   Final    Coagulation Studies:  Recent Labs  04/08/14 1040  LABPROT 13.7  INR 1.05    Imaging: Dg Chest 2 View  04/08/2014   CLINICAL DATA:  Weakness, history of coronary artery disease  EXAM: CHEST  2  VIEW  COMPARISON:  PA and lateral chest of February 19, 2014  FINDINGS: The lungs are hyperinflated with hemidiaphragm flattening. There is no focal infiltrate. The heart and pulmonary vascularity are within the limits of normal. The mediastinum is normal in width. There is no pleural effusion or pneumothorax. There is a hiatal hernia. The bony thorax exhibits no acute abnormality. The patient has undergone previous kyphoplasty of the body of T12.  IMPRESSION: COPD without evidence of CHF, pneumonia, nor other acute cardiopulmonary abnormality.   Electronically Signed   By: David  [place]   On: 04/08/2014 11:22   Ct Head Wo Contrast  04/08/2014   CLINICAL DATA:  80 year old with seizure.  Generalized weakness.  EXAM: CT HEAD WITHOUT CONTRAST  TECHNIQUE: Contiguous axial images were obtained from the base of the skull through the vertex without contrast.  COMPARISON:  03/12/2012  FINDINGS: No evidence for acute hemorrhage, mass lesion, midline shift, hydrocephalus or large infarct. There is stable cerebral atrophy. Low-density in the periventricular white matter suggests chronic small vessel ischemic changes. A polyp or retention cyst in the right maxillary sinus. A mildly displaced left nasal bone fracture.  IMPRESSION: No acute intracranial abnormality.  Stable atrophy and evidence for chronic small vessel ischemic changes.  Left nasal bone fracture of unknown age.   Electronically Signed   By: Richarda Overlie M.D.   On: 04/08/2014 10:57       Assessment and plan per attending neurologist  Felicie Morn PA-C [facility] [phone]  04/08/2014, 4:46 PM   Assessment/Plan: 80 YO female presenting to hospital after episode of chest pain followed by paleness of skin and then staring spell.  Spell lasted for 5 minutes followed by N/V.  Episode does not classically fit seizure however patient has had previous episodes with questionable seizure and same description.  During those episodes MRI and EEG were  normal. At this time differential includes seizure (however description not entirely consistent with seizure) versus CP/syncope.   Recommend: 1) Obtaine correct dose of Keppra she was on at home--(patient feels she is on 800 mg BID/ H&P states 500 mg BID/ last charting states 750 mg BID) and place back on correct home dose.  2) No further work up recommended.   No further neurologic intervention is recommended at this time.  If further questions arise, please call or page at that time.  Thank you for allowing neurology to participate in the care of this patient.  Felicie Morn PA-C [facility] [phone]  04/08/2014, 5:17 PM   Patient seen and examined together with physician assistant and I concur with the assessment and plan.  Wyatt Portela, MD

## 2014-04-08 NOTE — H&P (Signed)
Date: 04/08/2014               Patient Name:  [name] [name] MRN: [id number]  DOB: 06-Sep-1926 Age / Sex: 80 y.o., female   PCP: Eartha InchMichael C Badger, MD         Medical Service: Internal Medicine Teaching Service         Attending Physician: Dr. Burns SpainElizabeth A Butcher, MD    First Contact: Dr. Tasia CatchingsAhmed Pager: [phone]  Second Contact: Dr. Garald BraverKennerly Pager: [phone]       After Hours (After 5p/  First Contact Pager: [phone]  weekends / holidays): Second Contact Pager: [phone]   Chief Complaint: chest pain, seizure  History of Present Illness:   Patient is 80 yo female with hx of CAD on medical management, HTN, COPD, seizure (on keppra), depression (on celexa) presents with chest pain around 8 am this morning in the setting of a questionable seizure.  She was not feeling well last several days per son. Woke up, had some coffee, had blank stare observed by son while sitting on her chair. She wasn't responding to the son. She was laid down on the side of the chair. Son called 911. She had emesis with this. Bit her tongue. Bowel and bladder incontinence. It lasted 15 mins. Not responding, confused when she left home with EMS. In the ED complained of chest pain located on the left chest, 8/10, dull, without radiation. Denies any vision changes/headache/n/v currently. She states that she has been feeling very weak and dizzy. She gets extremely dizzy when she tries to walk to the point that she just lies down and sleeps when she feels dizzy. Denies hearing changes or vertigo. She feels like she will pass out from the weakness/dizziness but denies passing out.  She doesn't eat that much. States that she doesn't like to prepare food for herself but would eat if somebody would prepare it for her. However, son states that he tried to bring her food and prepare food for her but she doesn't like it. Her son passed away 1 year ago, has been on celexa, switched to lexapro about 6 weeks ago for depression. She only  drinks about 4 glasses of water but drinks 5-6 cups of coffee daily.   Story is not consistent as son told neurology team that she didn't have tongue biting/incontinence.   Meds: Current Facility-Administered Medications  Medication Dose Route Frequency Provider Last Rate Last Dose  . acetaminophen (TYLENOL) tablet 650 mg  650 mg Oral Q6H PRN Ky BarbanSolianny D Kennerly, MD       Or  . acetaminophen (TYLENOL) suppository 650 mg  650 mg Rectal Q6H PRN Ky BarbanSolianny D Kennerly, MD      . albuterol (PROVENTIL) (2.5 MG/3ML) 0.083% nebulizer solution 2.5 mg  2.5 mg Nebulization Q6H PRN Ky BarbanSolianny D Kennerly, MD      . Melene Muller[START ON 04/09/2014] aspirin EC tablet 81 mg  81 mg Oral Daily Ky BarbanSolianny D Kennerly, MD      . clopidogrel (PLAVIX) tablet 75 mg  75 mg Oral Daily Ky BarbanSolianny D Kennerly, MD      . furosemide (LASIX) tablet 20 mg  20 mg Oral Daily Ky BarbanSolianny D Kennerly, MD      . heparin injection 5,000 Units  5,000 Units Subcutaneous 3 times per day Ky BarbanSolianny D Kennerly, MD      . isosorbide mononitrate (IMDUR) 24 hr tablet 30 mg  30 mg Oral Daily Ky BarbanSolianny D Kennerly, MD      . levETIRAcetam (  KEPPRA) tablet 750 mg  750 mg Oral BID Ky Barban, MD      . LORazepam (ATIVAN) tablet 0.5 mg  0.5 mg Oral Q8H PRN Ky Barban, MD      . morphine 2 MG/ML injection 1 mg  1 mg Intravenous Q4H PRN Ky Barban, MD      . multivitamin with minerals tablet 1 tablet  1 tablet Oral Daily Ky Barban, MD      . ondansetron (ZOFRAN) tablet 4 mg  4 mg Oral Q6H PRN Ky Barban, MD       Or  . ondansetron (ZOFRAN) injection 4 mg  4 mg Intravenous Q6H PRN Ky Barban, MD      . pantoprazole (PROTONIX) EC tablet 40 mg  40 mg Oral Daily Ky Barban, MD      . simvastatin (ZOCOR) tablet 20 mg  20 mg Oral q1800 Ky Barban, MD      . sodium chloride 0.9 % injection 3 mL  3 mL Intravenous Q12H Ky Barban, MD      . tiotropium [facility]) inhalation capsule 18 mcg  18 mcg  Inhalation Daily Ky Barban, MD      . traMADol Janean Sark) tablet 50 mg  50 mg Oral QHS Ky Barban, MD        Allergies: Allergies as of 04/08/2014 - Review Complete 04/08/2014  Allergen Reaction Noted  . Codeine Nausea And Vomiting 12/01/2010   Past Medical History  Diagnosis Date  . SOB (shortness of breath)   . Fatigue   . Anxiety   . Seizure   . Dizziness   . Hot flashes   . Ischemic heart disease   . Hypertension   . Hyperlipidemia   . Coronary artery disease     [name]  . Anemia   . Syncope and collapse     RECURRENT, RELATED TO HYPONATREMIA, NARCOTICS, AND BRADYCARDIA  . Tobacco abuse     PAST  . Memory disorder   . Pneumonia   . Flu syndrome january 2013   Past Surgical History  Procedure Laterality Date  . Cardiac catheterization  2004  . Abdominal hysterectomy      early 1970's for fibroid   Family History  Problem Relation Age of Onset  . Heart attack Father   . Heart failure Mother   . CAD Father   . Congestive Heart Failure Mother   . Other Brother     cerebral hemmorhage  . Coronary artery disease Sister    History   Social History  . Marital Status: Widowed    Spouse Name: N/A    Number of Children: N/A  . Years of Education: N/A   Occupational History  . Not on file.   Social History Main Topics  . Smoking status: Former Smoker -- 1.00 packs/day for 40 years    Quit date: 08/23/1999  . Smokeless tobacco: Never Used  . Alcohol Use: No  . Drug Use: No  . Sexual Activity: Not on file   Other Topics Concern  . Not on file   Social History Narrative   Housewife   2 Children   Widowed    Review of Systems: Review of Systems  Constitutional: Positive for malaise/fatigue. Negative for fever, chills and weight loss.  HENT: Negative for ear discharge and ear pain.   Eyes: Negative for blurred vision, photophobia and redness.  Respiratory: Negative for cough, hemoptysis, shortness of breath, wheezing  and  stridor.   Cardiovascular: Positive for chest pain. Negative for palpitations, orthopnea, leg swelling and PND.  Gastrointestinal: Positive for heartburn and vomiting. Negative for abdominal pain, diarrhea, blood in stool and melena.  Genitourinary: Negative.   Musculoskeletal: Negative.   Skin: Negative.   Neurological: Positive for dizziness, seizures, loss of consciousness and weakness. Negative for headaches.  Endo/Heme/Allergies: Negative.   Psychiatric/Behavioral: Positive for depression.     Physical Exam: Blood pressure 120/54, pulse 69, temperature 98.9 F (37.2 C), temperature source Oral, resp. rate 20, SpO2 95.00%. Physical Exam  Constitutional: She is oriented to person, place, and time. She appears well-developed and well-nourished. No distress.  HENT:  Head: Normocephalic and atraumatic.  Right Ear: External ear normal.  Left Ear: External ear normal.  Mouth/Throat: Mucous membranes are dry. No oropharyngeal exudate.  Eyes: Conjunctivae and EOM are normal. Pupils are equal, round, and reactive to light. Right eye exhibits no discharge. Left eye exhibits no discharge. No scleral icterus.  Neck: Normal range of motion. Neck supple. No JVD present.  Cardiovascular: Normal rate and regular rhythm.  Exam reveals no gallop and no friction rub.   No murmur heard. Respiratory: Effort normal and breath sounds normal. No respiratory distress. She has no wheezes. She has no rales. She exhibits no tenderness.  GI: Soft. Bowel sounds are normal. She exhibits no distension and no mass. There is no rebound and no guarding.  Musculoskeletal: Normal range of motion. She exhibits no edema and no tenderness.  Lymphadenopathy:    She has no cervical adenopathy.  Neurological: She is alert and oriented to person, place, and time. She has normal reflexes. No cranial nerve deficit. She exhibits normal muscle tone. Coordination normal.  Normal finger-to-nose exam, normal heel-to-shin, no  pronator drift, normal alternating movement.  Skin: Skin is warm and dry. She is not diaphoretic.  Psychiatric: She has a normal mood and affect.    Lab results: Basic Metabolic Panel:  Recent Labs  16/10/96 1040  NA 140  K 4.1  CL 101  CO2 25  GLUCOSE 104*  BUN 15  CREATININE 1.38*  CALCIUM 9.6   Liver Function Tests:  Recent Labs  04/08/14 1040  AST 19  ALT 10  ALKPHOS 61  BILITOT 0.3  PROT 6.9  ALBUMIN 3.8   No results found for this basename: LIPASE, AMYLASE,  in the last 72 hours No results found for this basename: AMMONIA,  in the last 72 hours CBC:  Recent Labs  04/08/14 1040  WBC 6.9  NEUTROABS 4.0  HGB 11.3*  HCT 34.0*  MCV 90.7  PLT 280   Cardiac Enzymes: No results found for this basename: CKTOTAL, CKMB, CKMBINDEX, TROPONINI,  in the last 72 hours BNP: No results found for this basename: PROBNP,  in the last 72 hours D-Dimer: No results found for this basename: DDIMER,  in the last 72 hours CBG:  Recent Labs  04/08/14 1035  GLUCAP 97   Hemoglobin A1C: No results found for this basename: HGBA1C,  in the last 72 hours Fasting Lipid Panel: No results found for this basename: CHOL, HDL, LDLCALC, TRIG, CHOLHDL, LDLDIRECT,  in the last 72 hours Thyroid Function Tests: No results found for this basename: TSH, T4TOTAL, FREET4, T3FREE, THYROIDAB,  in the last 72 hours Anemia Panel: No results found for this basename: VITAMINB12, FOLATE, FERRITIN, TIBC, IRON, RETICCTPCT,  in the last 72 hours Coagulation:  Recent Labs  04/08/14 1040  LABPROT 13.7  INR 1.05   Urine  Drug Screen: Drugs of Abuse  No results found for this basename: labopia, cocainscrnur, labbenz, amphetmu, thcu, labbarb    Alcohol Level: No results found for this basename: ETH,  in the last 72 hours Urinalysis:  Recent Labs  04/08/14 1132  COLORURINE YELLOW  LABSPEC 1.019  PHURINE 6.0  GLUCOSEU NEGATIVE  HGBUR NEGATIVE  BILIRUBINUR NEGATIVE  KETONESUR NEGATIVE    PROTEINUR NEGATIVE  UROBILINOGEN 0.2  NITRITE NEGATIVE  LEUKOCYTESUR SMALL*   Misc. Labs:   Imaging results:  Dg Chest 2 View  04/08/2014   CLINICAL DATA:  Weakness, history of coronary artery disease  EXAM: CHEST  2 VIEW  COMPARISON:  PA and lateral chest of February 19, 2014  FINDINGS: The lungs are hyperinflated with hemidiaphragm flattening. There is no focal infiltrate. The heart and pulmonary vascularity are within the limits of normal. The mediastinum is normal in width. There is no pleural effusion or pneumothorax. There is a hiatal hernia. The bony thorax exhibits no acute abnormality. The patient has undergone previous kyphoplasty of the body of T12.  IMPRESSION: COPD without evidence of CHF, pneumonia, nor other acute cardiopulmonary abnormality.   Electronically Signed   By: David  [place]   On: 04/08/2014 11:22   Ct Head Wo Contrast  04/08/2014   CLINICAL DATA:  80 year old with seizure.  Generalized weakness.  EXAM: CT HEAD WITHOUT CONTRAST  TECHNIQUE: Contiguous axial images were obtained from the base of the skull through the vertex without contrast.  COMPARISON:  03/12/2012  FINDINGS: No evidence for acute hemorrhage, mass lesion, midline shift, hydrocephalus or large infarct. There is stable cerebral atrophy. Low-density in the periventricular white matter suggests chronic small vessel ischemic changes. A polyp or retention cyst in the right maxillary sinus. A mildly displaced left nasal bone fracture.  IMPRESSION: No acute intracranial abnormality.  Stable atrophy and evidence for chronic small vessel ischemic changes.  Left nasal bone fracture of unknown age.   Electronically Signed   By: Richarda Overlie M.D.   On: 04/08/2014 10:57    Other results: EKG: normal EKG, normal sinus rhythm, unchanged from previous tracings.  Assessment & Plan by Problem: Principal Problem:   Chest pain Active Problems:   Anxiety   Hypertension   Hyperlipidemia   Coronary artery disease   Anemia    GERD (gastroesophageal reflux disease)   Diastolic CHF, chronic   Mild cognitive impairment   Patient is 80 yo female with hx of CAD on medical management, HTN, COPD, seizure (on keppra), depression (on celexa) presents with chest pain around 8 am this morning in the setting of a questionable seizure.   Chest pain - has TIMI 3-4. With dizziness - came in the setting of seizure, could be 2/2 to MSK related. She is not tachypnic currently - unlikely PE. Low geneva. She has moderate risk for ACS given her CAD, HTN, HLD. No longer has chest pain. - EKG negative for ischemia. Will repeat in AM, trend trops. - CBC/BMP tomorrow.  CHF - Echo (6/08) showed EF 55-60% with grade I diastolic dysfunction, mild LAE.  - doesn't appear to be volume overloaded now. Will hold home lasix for now as she is dry. - repeat ECHO. monitor i/o's.  CAD - Left heart cath in 2004 showed 70% ostial LAD stenosis and 50-60% proximal ramus disease. It was decided to manage her medically. Myoview (5/11) showed EF 74% and was a normal study.  - continue plavix. Cont simvastatin.  Acute confusion/unresponsiveness - could be 2/2 to absence  seizure.  -neuro consulted for possible seizure while on 750 mg BID keppra. Doesn't feel like this was seizure. - doesn't recommend further workup for seizure. Continue current keppra dose.  Weakness - likely 2/2 to not eating -will talk to SW about meals on wheels.    Dispo: Disposition is deferred at this time, awaiting improvement of current medical problems. Anticipated discharge in approximately 2-3 day(s).   The patient does have a current PCP [name], MD) and does need an [facility] hospital follow-up appointment after discharge.  The patient does not know have transportation limitations that hinder transportation to clinic appointments.  Signed: [name], MD 04/08/2014, 3:35 PM

## 2014-04-08 NOTE — ED Notes (Signed)
EMS - Pt coming from home with c/o of witnessed seizure by son.  Pt was nauseated and diaphoretic just after the seizure with 2 episodes of emesis.  Pt alert x 4.  Pt also c/o of left sided chest pain with no radiation.  Pt given 4mg  Zofran.  CBG 138, BP 130/65, 63 pulse.  Unremarkable 12 lead.

## 2014-04-08 NOTE — ED Notes (Signed)
Pt given mouth swab.

## 2014-04-08 NOTE — ED Notes (Signed)
Internal medicine at bedside

## 2014-04-09 DIAGNOSIS — I2510 Atherosclerotic heart disease of native coronary artery without angina pectoris: Secondary | ICD-10-CM

## 2014-04-09 DIAGNOSIS — I509 Heart failure, unspecified: Secondary | ICD-10-CM

## 2014-04-09 DIAGNOSIS — R42 Dizziness and giddiness: Secondary | ICD-10-CM

## 2014-04-09 DIAGNOSIS — R569 Unspecified convulsions: Secondary | ICD-10-CM

## 2014-04-09 DIAGNOSIS — F29 Unspecified psychosis not due to a substance or known physiological condition: Secondary | ICD-10-CM

## 2014-04-09 DIAGNOSIS — R079 Chest pain, unspecified: Secondary | ICD-10-CM

## 2014-04-09 LAB — BASIC METABOLIC PANEL
Anion gap: 10 (ref 5–15)
BUN: 11 mg/dL (ref 6–23)
CHLORIDE: 105 meq/L (ref 96–112)
CO2: 24 mEq/L (ref 19–32)
CREATININE: 1.25 mg/dL — AB (ref 0.50–1.10)
Calcium: 8.8 mg/dL (ref 8.4–10.5)
GFR, EST AFRICAN AMERICAN: 44 mL/min — AB (ref >90)
GFR, EST NON AFRICAN AMERICAN: 38 mL/min — AB (ref >90)
Glucose, Bld: 94 mg/dL (ref 70–99)
Potassium: 4.6 mEq/L (ref 3.7–5.3)
Sodium: 139 mEq/L (ref 137–147)

## 2014-04-09 LAB — RAPID URINE DRUG SCREEN, HOSP PERFORMED
Amphetamines: NOT DETECTED
Barbiturates: NOT DETECTED
Benzodiazepines: NOT DETECTED
COCAINE: NOT DETECTED
OPIATES: NOT DETECTED
Tetrahydrocannabinol: NOT DETECTED

## 2014-04-09 LAB — CBC
HCT: 31.2 % — ABNORMAL LOW (ref 36.0–46.0)
Hemoglobin: 10.3 g/dL — ABNORMAL LOW (ref 12.0–15.0)
MCH: 30.5 pg (ref 26.0–34.0)
MCHC: 33 g/dL (ref 30.0–36.0)
MCV: 92.3 fL (ref 78.0–100.0)
PLATELETS: 268 10*3/uL (ref 150–400)
RBC: 3.38 MIL/uL — ABNORMAL LOW (ref 3.87–5.11)
RDW: 13.3 % (ref 11.5–15.5)
WBC: 7.3 10*3/uL (ref 4.0–10.5)

## 2014-04-09 MED ORDER — PANTOPRAZOLE SODIUM 40 MG PO TBEC
40.0000 mg | DELAYED_RELEASE_TABLET | Freq: Once | ORAL | Status: AC
  Administered 2014-04-09: 40 mg via ORAL

## 2014-04-09 MED ORDER — ALBUTEROL SULFATE (2.5 MG/3ML) 0.083% IN NEBU: 2.5000 mg | INHALATION_SOLUTION | Freq: Four times a day (QID) | RESPIRATORY_TRACT | Status: DC | PRN

## 2014-04-09 MED ORDER — ESCITALOPRAM OXALATE 10 MG PO TABS
10.0000 mg | ORAL_TABLET | Freq: Every day | ORAL | Status: DC
  Filled 2014-04-09: qty 1

## 2014-04-09 NOTE — Progress Notes (Signed)
Subjective: Feels fine today after receiving the fluid. Slept well. Has some mild 2/10 left chest pain. Denies dizziness, walked to bathroom with RN assistance.   Objective: Vital signs in last 24 hours: Filed Vitals:   04/08/14 1900 04/08/14 2000 04/09/14 0606 04/09/14 0737  BP:  107/54 122/48 131/84  Pulse: 68 66 70 74  Temp:  98.7 F (37.1 C) 98.1 F (36.7 C) 98.5 F (36.9 C)  TempSrc:  Oral Oral Oral  Resp:  18 18 18   Height:      Weight:   57.7 kg (127 lb 3.3 oz)   SpO2: 95% 94% 94% 95%   Weight change:   Intake/Output Summary (Last 24 hours) at 04/09/14 1204 Last data filed at 04/09/14 0855  Gross per 24 hour  Intake   1240 ml  Output    150 ml  Net   1090 ml   Vitals reviewed. General: resting in bed, NAD. Thin appearing. HEENT: PERRL, EOMI, no scleral icterus Cardiac: RRR, no rubs, murmurs or gallops Pulm: clear to auscultation bilaterally, no wheezes, rales, or rhonchi Abd: soft, nontender, nondistended, BS present Ext: warm and well perfused, no pedal edema Neuro: alert and oriented X3, cranial nerves II-XII grossly intact, strength and sensation to light touch equal in bilateral upper and lower extremities  Lab Results: Basic Metabolic Panel:  Recent Labs Lab 04/08/14 1040 04/09/14 0410  NA 140 139  K 4.1 4.6  CL 101 105  CO2 25 24  GLUCOSE 104* 94  BUN 15 11  CREATININE 1.38* 1.25*  CALCIUM 9.6 8.8   Liver Function Tests:  Recent Labs Lab 04/08/14 1040  AST 19  ALT 10  ALKPHOS 61  BILITOT 0.3  PROT 6.9  ALBUMIN 3.8   No results found for this basename: LIPASE, AMYLASE,  in the last 168 hours No results found for this basename: AMMONIA,  in the last 168 hours CBC:  Recent Labs Lab 04/08/14 1040 04/09/14 0410  WBC 6.9 7.3  NEUTROABS 4.0  --   HGB 11.3* 10.3*  HCT 34.0* 31.2*  MCV 90.7 92.3  PLT 280 268   Cardiac Enzymes:  Recent Labs Lab 04/08/14 1607 04/08/14 2040  TROPONINI <0.30 <0.30   BNP: No results found for  this basename: PROBNP,  in the last 168 hours D-Dimer: No results found for this basename: DDIMER,  in the last 168 hours CBG:  Recent Labs Lab 04/08/14 1035  GLUCAP 97   Hemoglobin A1C: No results found for this basename: HGBA1C,  in the last 168 hours Fasting Lipid Panel: No results found for this basename: CHOL, HDL, LDLCALC, TRIG, CHOLHDL, LDLDIRECT,  in the last 168 hours Thyroid Function Tests:  Recent Labs Lab 04/08/14 1607  TSH 1.960   Coagulation:  Recent Labs Lab 04/08/14 1040  LABPROT 13.7  INR 1.05   Anemia Panel: No results found for this basename: VITAMINB12, FOLATE, FERRITIN, TIBC, IRON, RETICCTPCT,  in the last 168 hours Urine Drug Screen: Drugs of Abuse     Component Value Date/Time   LABOPIA NONE DETECTED 04/08/2014 2222   COCAINSCRNUR NONE DETECTED 04/08/2014 2222   LABBENZ NONE DETECTED 04/08/2014 2222   AMPHETMU NONE DETECTED 04/08/2014 2222   THCU NONE DETECTED 04/08/2014 2222   LABBARB NONE DETECTED 04/08/2014 2222    Alcohol Level: No results found for this basename: ETH,  in the last 168 hours Urinalysis:  Recent Labs Lab 04/08/14 1132  COLORURINE YELLOW  LABSPEC 1.019  PHURINE 6.0  GLUCOSEU NEGATIVE  HGBUR NEGATIVE  BILIRUBINUR NEGATIVE  KETONESUR NEGATIVE  PROTEINUR NEGATIVE  UROBILINOGEN 0.2  NITRITE NEGATIVE  LEUKOCYTESUR SMALL*   Misc. Labs:   Micro Results: No results found for this or any previous visit (from the past 240 hour(s)). Studies/Results: Dg Chest 2 View  04/08/2014   CLINICAL DATA:  Weakness, history of coronary artery disease  EXAM: CHEST  2 VIEW  COMPARISON:  PA and lateral chest of February 19, 2014  FINDINGS: The lungs are hyperinflated with hemidiaphragm flattening. There is no focal infiltrate. The heart and pulmonary vascularity are within the limits of normal. The mediastinum is normal in width. There is no pleural effusion or pneumothorax. There is a hiatal hernia. The bony thorax exhibits no acute  abnormality. The patient has undergone previous kyphoplasty of the body of T12.  IMPRESSION: COPD without evidence of CHF, pneumonia, nor other acute cardiopulmonary abnormality.   Electronically Signed   By: David  [place]   On: 04/08/2014 11:22   Ct Head Wo Contrast  04/08/2014   CLINICAL DATA:  80 year old with seizure.  Generalized weakness.  EXAM: CT HEAD WITHOUT CONTRAST  TECHNIQUE: Contiguous axial images were obtained from the base of the skull through the vertex without contrast.  COMPARISON:  03/12/2012  FINDINGS: No evidence for acute hemorrhage, mass lesion, midline shift, hydrocephalus or large infarct. There is stable cerebral atrophy. Low-density in the periventricular white matter suggests chronic small vessel ischemic changes. A polyp or retention cyst in the right maxillary sinus. A mildly displaced left nasal bone fracture.  IMPRESSION: No acute intracranial abnormality.  Stable atrophy and evidence for chronic small vessel ischemic changes.  Left nasal bone fracture of unknown age.   Electronically Signed   By: Richarda Overlie M.D.   On: 04/08/2014 10:57   Medications: I have reviewed the patient's current medications. Scheduled Meds: . clopidogrel  75 mg Oral Daily  . escitalopram  10 mg Oral Daily  . heparin  5,000 Units Subcutaneous 3 times per day  . isosorbide mononitrate  30 mg Oral Daily  . levETIRAcetam  750 mg Oral BID  . multivitamin with minerals  1 tablet Oral Daily  . pantoprazole  40 mg Oral Daily  . simvastatin  20 mg Oral q1800  . sodium chloride  3 mL Intravenous Q12H  . tiotropium  18 mcg Inhalation Daily   Continuous Infusions:  PRN Meds:.acetaminophen, acetaminophen, albuterol, LORazepam, morphine injection, ondansetron (ZOFRAN) IV, ondansetron Assessment/Plan: Principal Problem:   Chest pain Active Problems:   Anxiety   Hypertension   Hyperlipidemia   Coronary artery disease   Anemia   GERD (gastroesophageal reflux disease)   Diastolic CHF, chronic    Mild cognitive impairment  Patient is 80 yo female with hx of CAD on medical management, HTN, COPD, seizure (on keppra), depression (on celexa) presents with chest pain around 8 am this morning in the setting of a questionable seizure.  Chest pain - has TIMI 3-4. With dizziness  - came in the setting of seizure, could be 2/2 to MSK related. She is not tachypnic currently - unlikely PE. Low geneva. She has moderate risk for ACS given her CAD, HTN, HLD. Chest pain is resolving.  - EKG negative for ischemia. trops negative. Less likely ACS.  Weakness and dizziness - likely 2/2 to not eating, also dehydrated given low water intake with 5 cups of coffee - dizziness resolved with IVF resuscitation.  -arranged meals on wheels to help with meals. - Pt advised to drink 2 cups  of water for each cup of coffee. Pt agreeable to increasing water intake.  - PT consulted.   CHF  - Echo (6/08) showed EF 55-60% with grade I diastolic dysfunction, mild LAE.  - doesn't appear to be volume overloaded now. Will hold home lasix for now as she is dry.  - don't feel necessary to repeat ECHO this time.  CAD  - Left heart cath in 2004 showed 70% ostial LAD stenosis and 50-60% proximal ramus disease. It was decided to manage her medically. Myoview (5/11) showed EF 74% and was a normal study.  - continue plavix. Cont simvastatin.   Acute confusion/unresponsiveness - could be 2/2 to absence seizure.  -neuro consulted for possible seizure while on 750 mg BID keppra. Doesn't feel like this was seizure.  - doesn't recommend further workup for seizure. Continue current keppra dose 750mg  bid.  Dispo: Disposition is deferred at this time, awaiting improvement of current medical problems.  Anticipated discharge in approximately 1-2 day(s).   The patient does have a current PCP [name], MD) and does need an [facility] hospital follow-up appointment after discharge.  The patient does have transportation limitations that  hinder transportation to clinic appointments.  .Services Needed at time of discharge: Y = Yes, Blank = No PT:   OT:   RN:   Equipment:   Other:     LOS: 1 day   [name], MD 04/09/2014, 12:04 PM

## 2014-04-09 NOTE — ED Provider Notes (Signed)
This patient was seen in conjunction with the resident physician, Dr. Katrinka BlazingSmith.  The documentation accurately reflects the patient's ED evaluation.  On my exam, the patient had no chest pain, was in no distress. However, with recurrence of seizure activity, and after an episode of chest pain, given her history of multiple medical problems, including seizures, CAD, she required admission for further evaluation, management. EKG showed sinus rhythm, no evidence of ischemia, normal intervals  Gerhard Munchobert Salisa Broz, MD 04/09/14 [phone]

## 2014-04-09 NOTE — H&P (Signed)
  I have seen and examined the patient myself, and I have reviewed the note by [facility] III and was present during the interview and physical exam.  Please see my separate H&P for additional findings, assessment, and plan.   Signed: Hyacinth Meekerasrif Keiran Sias, MD 04/09/2014, 9:50 AM

## 2014-04-09 NOTE — Evaluation (Signed)
Physical Therapy Evaluation Patient Details Name: [name] [name] G Sinatra MRN: [id number] DOB: [birth year] Today's Date: 04/09/2014   History of Present Illness  Patient is a 80 y/o female admitted s/p witnessed seizure and chest pain. PMH positive for CAD, COPD, diastolic CHF (55% EF), HTN, hyperlipidemia, anxiety, dizziness and seizures.   Clinical Impression  Patient presents with functional limitations due to deficits listed in PT problem list (see below). Pt presents with mild cognitive deficits - unsure of baseline and balance deficits limiting safe mobility. Pt fatigues with exercise and exhibits SOB with drop in Sa02 on RA. Not sure if pt lives alone or if son is present to provide supervision. Pt would benefit from acute PT and follow up HHPT to improve balance, gait and maximize independence so pt can safely return to PLOF. Would recommend supervision at home for mobility at least for the first week to decrease fall risk.     Follow Up Recommendations Home health PT;Supervision for mobility/OOB    Equipment Recommendations  None recommended by PT    Recommendations for Other Services OT consult     Precautions / Restrictions Precautions Precautions: Fall Precaution Comments: Seizure precautions. Restrictions Weight Bearing Restrictions: No      Mobility  Bed Mobility Overal bed mobility: Needs Assistance Bed Mobility: Supine to Sit;Sit to Supine     Supine to sit: Supervision;HOB elevated Sit to supine: Supervision   General bed mobility comments: Use of rails for support. VC for technique.  Transfers Overall transfer level: Needs assistance Equipment used: Rolling walker (2 wheeled) Transfers: Sit to/from Stand Sit to Stand: Min guard         General transfer comment: Stood from EOB x2 with and without use of RW. Able to stand without UE support. Unsteady initially upon standing requiring min guard for safety.  Ambulation/Gait Ambulation/Gait assistance: Min  guard Ambulation Distance (Feet): 200 Feet Assistive device: Rolling walker (2 wheeled) Gait Pattern/deviations: Step-through pattern;Decreased stride length;Trunk flexed;Drifts right/left Gait velocity: Decreased   General Gait Details: VC for upright posture and for RW management. A few short standing rest breaks due to SOB. Sa02 decreased to 86%, resolved quickly with cues for pursed lip breathing.  Stairs            Wheelchair Mobility    Modified Rankin (Stroke Patients Only)       Balance Overall balance assessment: Needs assistance   Sitting balance-Leahy Scale: Good       Standing balance-Leahy Scale: Fair Standing balance comment: Able to perform dynamic standing activities with only 1 UE support for short period; use of BUEs for support on RW during longer distances for safety.                             Pertinent Vitals/Pain Pain Assessment: No/denies pain    Home Living Family/patient expects to be discharged to:: Private residence Living Arrangements: Children;Alone Available Help at Discharge: Family;Available PRN/intermittently (per patient, son works during the day.) Type of Home: Apartment Home Access: Elevator     Home Layout: One level Home Equipment: Cane - single point;Walker - 2 wheels;Bedside commode;Tub bench      Prior Function Level of Independence: Independent with assistive device(s)   Gait / Transfers Assistance Needed: Pt ambulates with RW vs SPC at home. "i always misplace my canes in my apt." Pt reports walking to the mailbox to walk her dog.  ADL's / Homemaking Assistance Needed: Pt reports she showers  when her son or other family members are in the apt for safety. Requesting meals on wheels as pt reports son cooks at midnight some nights.        Hand Dominance        Extremity/Trunk Assessment   Upper Extremity Assessment: Overall WFL for tasks assessed           Lower Extremity Assessment:  Generalized weakness         Communication   Communication: No difficulties  Cognition Arousal/Alertness: Awake/alert Behavior During Therapy: WFL for tasks assessed/performed Overall Cognitive Status: No family/caregiver present to determine baseline cognitive functioning (Pt reported social situation/home setup is inconsistent with reports from son in MD note. no family members present to corroborate history.)                      General Comments      Exercises Other Exercises Other Exercises: Sit <--> stand x10 with cues to decrease speed and slow descent to emphasize eccentric quadricep control      Assessment/Plan    PT Assessment Patient needs continued PT services  PT Diagnosis Generalized weakness;Difficulty walking   PT Problem List Decreased strength;Decreased cognition;Decreased knowledge of use of DME;Decreased activity tolerance;Decreased safety awareness;Decreased knowledge of precautions;Decreased balance;Decreased mobility;Cardiopulmonary status limiting activity  PT Treatment Interventions DME instruction;Balance training;Gait training;Cognitive remediation;Functional mobility training;Patient/family education;Therapeutic activities;Therapeutic exercise   PT Goals (Current goals can be found in the Care Plan section) Acute Rehab PT Goals Patient Stated Goal: to be living home alone. PT Goal Formulation: With patient Time For Goal Achievement: 04/23/14 Potential to Achieve Goals: Fair    Frequency Min 3X/week   Barriers to discharge Decreased caregiver support Pt reports living alone.    Co-evaluation               End of Session Equipment Utilized During Treatment: Gait belt Activity Tolerance: Patient tolerated treatment well Patient left: in bed;with call bell/phone within reach;with bed alarm set Nurse Communication: Mobility status    Functional Assessment Tool Used: Clinical judgment. Functional Limitation: Mobility: Walking and  moving around Mobility: Walking and Moving Around Current Status [phone]): At least 1 percent but less than 20 percent impaired, limited or restricted Mobility: Walking and Moving Around Goal Status [phone]): At least 1 percent but less than 20 percent impaired, limited or restricted    Time: 1400-1430 PT Time Calculation (min): 30 min   Charges:   PT Evaluation $Initial PT Evaluation Tier I: 1 Procedure PT Treatments $Gait Training: 8-22 mins   PT G Codes:   Functional Assessment Tool Used: Clinical judgment. Functional Limitation: Mobility: Walking and moving around    [place], [place] A 04/09/2014, 3:13 PM    Alvie Heidelberg, PT, DPT [phone]

## 2014-04-09 NOTE — Progress Notes (Signed)
Subjective: Patient is 80 yo female with hx of CAD on medical management, HTN, COPD, diastolic CHF (55% EF), and seizure (on keppra), depression (on celexa) who presented on admission with chest pain in the setting of a questionable seizure.  This morning, patient reports pain to be 2/10 in severity, localized below left clavicle, and "pressing" in nature.  Patient reports that she feels "much better."  Denies dizziness and dyspnea, even after getting up to go to the bathroom.  Pt had some trouble sleeping overnight, but fell asleep after receiving Ativan around 3AM.  Pt is interested in home PT and Meals on Wheels.    Objective: Vital signs in last 24 hours: Filed Vitals:   04/08/14 1900 04/08/14 2000 04/09/14 0606 04/09/14 0737  BP:  107/54 122/48 131/84  Pulse: 68 66 70 74  Temp:  98.7 F (37.1 C) 98.1 F (36.7 C) 98.5 F (36.9 C)  TempSrc:  Oral Oral Oral  Resp:  18 18 18   Height:      Weight:   57.7 kg (127 lb 3.3 oz)   SpO2: 95% 94% 94% 95%   Weight change:   Intake/Output Summary (Last 24 hours) at 04/09/14 1048 Last data filed at 04/09/14 0855  Gross per 24 hour  Intake   1240 ml  Output    150 ml  Net   1090 ml   General appearance: Elderly female, NAD, normal pallor Lungs: Expiratory wheeze in right lower lobe.  Heart: Heart regular S1/S2, with no S3/S4.  No chest pain with palpation of left chest. +2 pedal, tibial pulses. Extremities: No peripheral edema.  Neurologic: Alert and oriented to general circumstances.   Lab Results: Basic Metabolic Panel:  Recent Labs Lab 04/08/14 1040 04/09/14 0410  NA 140 139  K 4.1 4.6  CL 101 105  CO2 25 24  GLUCOSE 104* 94  BUN 15 11  CREATININE 1.38* 1.25*  CALCIUM 9.6 8.8   Liver Function Tests:  Recent Labs Lab 04/08/14 1040  AST 19  ALT 10  ALKPHOS 61  BILITOT 0.3  PROT 6.9  ALBUMIN 3.8   No results found for this basename: LIPASE, AMYLASE,  in the last 168 hours No results found for this basename:  AMMONIA,  in the last 168 hours CBC:  Recent Labs Lab 04/08/14 1040 04/09/14 0410  WBC 6.9 7.3  NEUTROABS 4.0  --   HGB 11.3* 10.3*  HCT 34.0* 31.2*  MCV 90.7 92.3  PLT 280 268   Cardiac Enzymes:  Recent Labs Lab 04/08/14 1607 04/08/14 2040  TROPONINI <0.30 <0.30   BNP: No results found for this basename: PROBNP,  in the last 168 hours D-Dimer: No results found for this basename: DDIMER,  in the last 168 hours CBG:  Recent Labs Lab 04/08/14 1035  GLUCAP 97   Hemoglobin A1C: No results found for this basename: HGBA1C,  in the last 168 hours Fasting Lipid Panel: No results found for this basename: CHOL, HDL, LDLCALC, TRIG, CHOLHDL, LDLDIRECT,  in the last 168 hours Thyroid Function Tests:  Recent Labs Lab 04/08/14 1607  TSH 1.960   Coagulation:  Recent Labs Lab 04/08/14 1040  LABPROT 13.7  INR 1.05   Anemia Panel: No results found for this basename: VITAMINB12, FOLATE, FERRITIN, TIBC, IRON, RETICCTPCT,  in the last 168 hours Urine Drug Screen: Negative Drugs of Abuse     Component Value Date/Time   LABOPIA NONE DETECTED 04/08/2014 2222   COCAINSCRNUR NONE DETECTED 04/08/2014 2222   LABBENZ NONE  DETECTED 04/08/2014 2222   AMPHETMU NONE DETECTED 04/08/2014 2222   THCU NONE DETECTED 04/08/2014 2222   LABBARB NONE DETECTED 04/08/2014 2222    Alcohol Level: No results found for this basename: ETH,  in the last 168 hours Urinalysis:  Recent Labs Lab 04/08/14 1132  COLORURINE YELLOW  LABSPEC 1.019  PHURINE 6.0  GLUCOSEU NEGATIVE  HGBUR NEGATIVE  BILIRUBINUR NEGATIVE  KETONESUR NEGATIVE  PROTEINUR NEGATIVE  UROBILINOGEN 0.2  NITRITE NEGATIVE  LEUKOCYTESUR SMALL*     Micro Results: No results found for this or any previous visit (from the past 240 hour(s)). Studies/Results: Dg Chest 2 View  04/08/2014   CLINICAL DATA:  Weakness, history of coronary artery disease  EXAM: CHEST  2 VIEW  COMPARISON:  PA and lateral chest of February 19, 2014  FINDINGS:  The lungs are hyperinflated with hemidiaphragm flattening. There is no focal infiltrate. The heart and pulmonary vascularity are within the limits of normal. The mediastinum is normal in width. There is no pleural effusion or pneumothorax. There is a hiatal hernia. The bony thorax exhibits no acute abnormality. The patient has undergone previous kyphoplasty of the body of T12.  IMPRESSION: COPD without evidence of CHF, pneumonia, nor other acute cardiopulmonary abnormality.   Electronically Signed   By: David  [place]   On: 04/08/2014 11:22   Ct Head Wo Contrast  04/08/2014   CLINICAL DATA:  80 year old with seizure.  Generalized weakness.  EXAM: CT HEAD WITHOUT CONTRAST  TECHNIQUE: Contiguous axial images were obtained from the base of the skull through the vertex without contrast.  COMPARISON:  03/12/2012  FINDINGS: No evidence for acute hemorrhage, mass lesion, midline shift, hydrocephalus or large infarct. There is stable cerebral atrophy. Low-density in the periventricular white matter suggests chronic small vessel ischemic changes. A polyp or retention cyst in the right maxillary sinus. A mildly displaced left nasal bone fracture.  IMPRESSION: No acute intracranial abnormality.  Stable atrophy and evidence for chronic small vessel ischemic changes.  Left nasal bone fracture of unknown age.   Electronically Signed   By: Richarda Overlie M.D.   On: 04/08/2014 10:57   Medications: I have reviewed the patient's current medications. Scheduled Meds: . clopidogrel  75 mg Oral Daily  . escitalopram  10 mg Oral Daily  . heparin  5,000 Units Subcutaneous 3 times per day  . isosorbide mononitrate  30 mg Oral Daily  . levETIRAcetam  750 mg Oral BID  . multivitamin with minerals  1 tablet Oral Daily  . pantoprazole  40 mg Oral Daily  . simvastatin  20 mg Oral q1800  . sodium chloride  3 mL Intravenous Q12H  . tiotropium  18 mcg Inhalation Daily   Continuous Infusions:  PRN Meds:.acetaminophen, acetaminophen,  albuterol, LORazepam, morphine injection, ondansetron (ZOFRAN) IV, ondansetron  Assessment/Plan: Principal Problem:   Chest pain Active Problems:   Anxiety   Hypertension   Hyperlipidemia   Coronary artery disease   Anemia   GERD (gastroesophageal reflux disease)   Diastolic CHF, chronic   Mild cognitive impairment  Patient is 80 yo female with hx of CAD on medical management, HTN, COPD, diastolic CHF (55% EF), seizure (on keppra), depression (on celexa) who presents on hospital day 1 with resolving chest pain following seizure.   Chest pain Chest pain is 2/10 in severity.  Possibly unstable angina, given that CP occurs at rest and lasts CP >15 min.  TIMI score for UA: 3.  MI unlikely, given that repeat EKG today was negative  for ischemia and troponins negative x3.  PE unlikely, given low Wells criteria, absence of tachypnea, non-pleuritic pain, and afebrile.  Pneumonia considered given expiratory wheezes, but pt remains afebrile and CXR on 8/18 showed no signs of pneumonia.  CXR also showed no evidence of CHF, pleural effusion, or pneumothorax.  -Repeat ECHO unnecessary, given that most recent ECHO completed 6/8. -Will consult PT for assessment in hospital w/goal of receiving home PT to improve mobility, strength.  Weakness/Dizziness Dizziness most likely due to dehydration.  Weakness secondary to decreased food intake.  -Will hold home lasix for now given pt's dizziness and that she does not appear to be volume overloaded.  -Monitor vitals q4. -Nl saline 3 mL IV bid  -Will talk to SW about meals on wheels. PT says she would eat meals if they are already prepared.  -Pt advised to drink 2 cups of water for each cup of coffee.  Pt agreeable to increasing water intake.   CHF  Echo (6/08) showed EF 55-60% with grade I diastolic dysfunction, mild LAE.  - Monitor i/o's.   CAD  -Left heart cath in 2004 showed 70% ostial LAD stenosis and 50-60% proximal ramus disease. It was decided to  manage her medically. Myoview (5/11) showed EF 74% and was a normal study.  -Continue plavix.  -Cont simvastatin.  -Discontinue Aspirin 81 mg, as two anticoagulants not indicated in elderly female  Acute confusion/unresponsiveness/seizure-like behavior -Continue Keppra 750 mg BID per neuro.  No further seizure workup needed. -Keppra level to assess compliance pending -D/c'd tramadol given that it lowers seizure threshold.  Dispo: Disposition is deferred at this time, awaiting improvement of current medical problems. Anticipated discharge in approximately 2-3 day(s).  The patient does have a current PCP [name](Michael C Badger, MD) and [facility].  The patient does not know have transportation limitations that hinder transportation to clinic appointments.   This is a Psychologist, occupationalMedical Student Note.  The care of the patient was discussed with Dr. Hyacinth Meekerasrif Ahmed and the assessment and plan formulated with their assistance.  Please see their attached note for official documentation of the daily encounter.   LOS: 1 day   [place] B Nollie Terlizzi, Med Student 04/09/2014, 10:48 AM

## 2014-04-09 NOTE — Progress Notes (Signed)
  Date: 04/09/2014  Patient name: [name] Goldengnes G Pociask  Medical record number: [id number]  Date of birth: [birth year]   I have seen and evaluated [name] [name] and discussed their care with the Residency Team. [name] [name] was admitted for sz like activity. She has a known sz d/o and is tx with Keppra. Neurology has assessed the pt and feels that the sxs are not c/w a sz. She does take tramadol prn for sz.  After the sz, the pt had L sided CP. She has a known h/o CAD. She has R/O for AMI.   She also has dizziness, esp when standing, wt loss, anorexia, decreased endurance walking, and dyspnea on exertion. She was noted to have very little PO intake. She is on lasix. She was found to be orthostatic on admit and was hydrated.   Assessment and Plan: I have seen and evaluated the patient as outlined above. I agree with the formulated Assessment and Plan as detailed in the residents' admission note, with the following changes:   1. Presumed sz - pt likely compliant with Keppra but level pending. D/C tramadol. May D/C home and F/U with PCP.  2. CP - likely non cardiac but etiology not clear. Other life threatening etiologies R/O by hx or exam. She has known CAD but is not on a BB. She is on statin and Plavix. Will leave chronic BP meds up to PCP.  3. Dizziness - resolved with IVF. Hold Lasix until seen by PCP. Home PT.  Burns SpainElizabeth A Butcher, MD 8/19/20154:04 PM

## 2014-04-09 NOTE — Discharge Instructions (Addendum)
It was a pleasure taking care of you. You were admitted to the hospital for chest pain, dizziness, and shortness of breathe. Your chest pain has gotten better here. We have treated your dizziness with fluids. You should continue to work with physical therapist at home. We also will provide you with home health nursing and aide to help you with medications. Please take your seizure medicine and all other medication as prescribed. We have arranged meals on wheels for you to provide meals at home.  Please make sure you drink plenty of water daily.don't drink too much coffee as it can dehydrate you even more.  Dehydration, Adult Dehydration is when you lose more fluids from the body than you take in. Vital organs like the kidneys, brain, and heart cannot function without a proper amount of fluids and salt. Any loss of fluids from the body can cause dehydration.  CAUSES   Vomiting.  Diarrhea.  Excessive sweating.  Excessive urine output.  Fever. SYMPTOMS  Mild dehydration  Thirst.  Dry lips.  Slightly dry mouth. Moderate dehydration  Very dry mouth.  Sunken eyes.  Skin does not bounce back quickly when lightly pinched and released.  Dark urine and decreased urine production.  Decreased tear production.  Headache. Severe dehydration  Very dry mouth.  Extreme thirst.  Rapid, weak pulse (more than 100 beats per minute at rest).  Cold hands and feet.  Not able to sweat in spite of heat and temperature.  Rapid breathing.  Blue lips.  Confusion and lethargy.  Difficulty being awakened.  Minimal urine production.  No tears. DIAGNOSIS  Your caregiver will diagnose dehydration based on your symptoms and your exam. Blood and urine tests will help confirm the diagnosis. The diagnostic evaluation should also identify the cause of dehydration. TREATMENT  Treatment of mild or moderate dehydration can often be done at home by increasing the amount of fluids that you drink.  It is best to drink small amounts of fluid more often. Drinking too much at one time can make vomiting worse. Refer to the home care instructions below. Severe dehydration needs to be treated at the hospital where you will probably be given intravenous (IV) fluids that contain water and electrolytes. HOME CARE INSTRUCTIONS   Ask your caregiver about specific rehydration instructions.  Drink enough fluids to keep your urine clear or pale yellow.  Drink small amounts frequently if you have nausea and vomiting.  Eat as you normally do.  Avoid:  Foods or drinks high in sugar.  Carbonated drinks.  Juice.  Extremely hot or cold fluids.  Drinks with caffeine.  Fatty, greasy foods.  Alcohol.  Tobacco.  Overeating.  Gelatin desserts.  Wash your hands well to avoid spreading bacteria and viruses.  Only take over-the-counter or prescription medicines for pain, discomfort, or fever as directed by your caregiver.  Ask your caregiver if you should continue all prescribed and over-the-counter medicines.  Keep all follow-up appointments with your caregiver. SEEK MEDICAL CARE IF:  You have abdominal pain and it increases or stays in one area (localizes).  You have a rash, stiff neck, or severe headache.  You are irritable, sleepy, or difficult to awaken.  You are weak, dizzy, or extremely thirsty. SEEK IMMEDIATE MEDICAL CARE IF:   You are unable to keep fluids down or you get worse despite treatment.  You have frequent episodes of vomiting or diarrhea.  You have blood or green matter (bile) in your vomit.  You have blood in your stool  or your stool looks black and tarry.  You have not urinated in 6 to 8 hours, or you have only urinated a small amount of very dark urine.  You have a fever.  You faint. MAKE SURE YOU:   Understand these instructions.  Will watch your condition.  Will get help right away if you are not doing well or get worse. Document Released:  08/08/2005 Document Revised: 10/31/2011 Document Reviewed: 03/28/2011 [facility] Patient Information 2015 [place], [place]. This information is not intended to replace advice given to you by your health care provider. Make sure you discuss any questions you have with your health care provider.

## 2014-04-09 NOTE — Care Management Note (Signed)
    Page 1 of 1   04/09/2014     4:13:41 PM CARE MANAGEMENT NOTE 04/09/2014  Patient:  [name] [name],[name] [name]   Account Number:  [id number]  Date Initiated:  04/09/2014  Documentation initiated by:  GRAVES-BIGELOW,Trevione Wert  Subjective/Objective Assessment:   Pt admitted for cp. Pt states she lives alone in apartment and son visits often.     Action/Plan:   Pt refusing HH services at this time and would like Meals on Wheels number. CM did provide pt with number for her to call. No further needs at this time.   Anticipated DC Date:  04/10/2014   Anticipated DC Plan:  HOME/SELF CARE      DC Planning Services  CM consult      Choice offered to / List presented to:             Status of service:  Completed, signed off Medicare Important Message given?   (If response is "NO", the following Medicare IM given date fields will be blank) Date Medicare IM given:   Medicare IM given by:   Date Additional Medicare IM given:   Additional Medicare IM given by:    Discharge Disposition:  HOME/SELF CARE  Per UR Regulation:  Reviewed for med. necessity/level of care/duration of stay  If discussed at Long Length of Stay Meetings, dates discussed:    Comments:

## 2014-04-09 NOTE — Progress Notes (Signed)
UR completed 

## 2014-04-09 NOTE — Discharge Summary (Signed)
Name: [name] [name] MRN: [id number] DOB: 09-19-1926 80 y.o. PCP: [name], MD  Date of Admission: 04/08/2014 10:00 AM Date of Discharge: 04/09/2014 Attending Physician: Burns Spain, MD  Discharge Diagnosis:  Principal Problem:   Chest pain Active Problems:   Anxiety   Hypertension   Hyperlipidemia   Coronary artery disease   Anemia   GERD (gastroesophageal reflux disease)   Diastolic CHF, chronic   Mild cognitive impairment  Discharge Medications:   Medication List    STOP taking these medications       azithromycin 250 MG tablet  Commonly known as:  ZITHROMAX     furosemide 20 MG tablet  Commonly known as:  LASIX     traMADol 50 MG tablet  Commonly known as:  ULTRAM      TAKE these medications       acetaminophen 500 MG tablet  Commonly known as:  TYLENOL  Take 1,000 mg by mouth every 6 (six) hours as needed. For pain.     albuterol (2.5 MG/3ML) 0.083% nebulizer solution  Commonly known as:  PROVENTIL  Take 3 mLs (2.5 mg total) by nebulization every 6 (six) hours as needed for wheezing.     CALCIUM 600 + D PO  Take 1 tablet by mouth daily.     clopidogrel 75 MG tablet  Commonly known as:  PLAVIX  Take 75 mg by mouth daily.     escitalopram 10 MG tablet  Commonly known as:  LEXAPRO  Take 10 mg by mouth daily.     isosorbide mononitrate 30 MG 24 hr tablet  Commonly known as:  IMDUR  Take 30 mg by mouth daily.     levETIRAcetam 500 MG tablet  Commonly known as:  KEPPRA  Take 750 mg by mouth every 12 (twelve) hours.     LORazepam 0.5 MG tablet  Commonly known as:  ATIVAN  Take 0.5 mg by mouth every 6 (six) hours as needed for anxiety.     multivitamin with minerals Tabs tablet  Take 1 tablet by mouth daily.     nitroGLYCERIN 0.4 MG SL tablet  Commonly known as:  NITROSTAT  Place 0.4 mg under the tongue every 5 (five) minutes as needed for chest pain.     pravastatin 40 MG tablet  Commonly known as:  PRAVACHOL  Take 40 mg by  mouth daily.     tiotropium 18 MCG inhalation capsule  Commonly known as:  SPIRIVA  Place 18 mcg into inhaler and inhale daily.        Disposition and follow-up:   [name] [name] was discharged from [facility] in Stable condition.  At the hospital follow up visit please address:  1.  Please assess need for lasix. We stopped it here because she appeared dry on our exam and we had to fluid resuscitate her.  2 . Tramadol was stopped its lowering of the seizure threshold.   2.  Labs / imaging needed at time of follow-up: none  3.  Pending labs/ test needing follow-up: none  Follow-up Appointments: Follow-up Information   Schedule an appointment as soon as possible for a visit with [name], MD.   Specialty:  Family Medicine   Contact information:   [address] [place] [address] [phone]       Follow up with Marca Ancona, MD. Schedule an appointment as soon as possible for a visit in 1 week.   Specialty:  Cardiology  Contact information:   [address] [place] [id number] [phone]       Discharge Instructions: Discharge Instructions   Diet - low sodium heart healthy    Complete by:  As directed      Increase activity slowly    Complete by:  As directed            Consultations:    Procedures Performed:  Dg Chest 2 View  04/08/2014   CLINICAL DATA:  Weakness, history of coronary artery disease  EXAM: CHEST  2 VIEW  COMPARISON:  PA and lateral chest of February 19, 2014  FINDINGS: The lungs are hyperinflated with hemidiaphragm flattening. There is no focal infiltrate. The heart and pulmonary vascularity are within the limits of normal. The mediastinum is normal in width. There is no pleural effusion or pneumothorax. There is a hiatal hernia. The bony thorax exhibits no acute abnormality. The patient has undergone previous kyphoplasty of the body of T12.  IMPRESSION: COPD without evidence of CHF, pneumonia,  nor other acute cardiopulmonary abnormality.   Electronically Signed   By: David  [place]az[place]   On: 04/08/2014 11:22   Ct Head Wo Contrast  04/08/2014   CLINICAL DATA:  80 year old with seizure.  Generalized weakness.  EXAM: CT HEAD WITHOUT CONTRAST  TECHNIQUE: Contiguous axial images were obtained from the base of the skull through the vertex without contrast.  COMPARISON:  03/12/2012  FINDINGS: No evidence for acute hemorrhage, mass lesion, midline shift, hydrocephalus or large infarct. There is stable cerebral atrophy. Low-density in the periventricular white matter suggests chronic small vessel ischemic changes. A polyp or retention cyst in the right maxillary sinus. A mildly displaced left nasal bone fracture.  IMPRESSION: No acute intracranial abnormality.  Stable atrophy and evidence for chronic small vessel ischemic changes.  Left nasal bone fracture of unknown age.   Electronically Signed   By: Richarda OverlieAdam  Henn M.D.   On: 04/08/2014 10:57    2D Echo:   Cardiac Cath:   Admission HPI:   Patient is 80 yo female with hx of CAD on medical management, HTN, COPD, seizure (on keppra), depression (on celexa) presents with chest pain around 8 am this morning in the setting of a questionable seizure.  She was not feeling well last several days per son. Woke up, had some coffee, had blank stare observed by son while sitting on her chair. She wasn't responding to the son. She was laid down on the side of the chair. Son called 911. She had emesis with this. Bit her tongue. Bowel and bladder incontinence. It lasted 15 mins. Not responding, confused when she left home with EMS. In the ED complained of chest pain located on the left chest, 8/10, dull, without radiation. Denies any vision changes/headache/n/v currently. She states that she has been feeling very weak and dizzy. She gets extremely dizzy when she tries to walk to the point that she just lies down and sleeps when she feels dizzy. Denies hearing changes or  vertigo. She feels like she will pass out from the weakness/dizziness but denies passing out.  She doesn't eat that much. States that she doesn't like to prepare food for herself but would eat if somebody would prepare it for her. However, son states that he tried to bring her food and prepare food for her but she doesn't like it. Her son passed away 1 year ago, has been on celexa, switched to lexapro about 6 weeks ago for depression. She only  drinks about 4 glasses of water but drinks 5-6 cups of coffee daily.  Story is not consistent as son told neurology team that she didn't have tongue biting/incontinence.   Hospital Course by problem list:  Patient is 80 yo female with hx of CAD on medical management, HTN, COPD, seizure (on keppra), depression (on celexa) presents with chest pain around 8 am this morning in the setting of a questionable seizure.  Chest pain - has TIMI 3-4. With dizziness  - came in the setting of seizure, could be 2/2 to MSK related. She is not tachypnic currently - unlikely PE. Low geneva. She has moderate risk for ACS given her CAD, HTN, HLD. Chest pain is resolving.  - EKG negative for ischemia. trops negative. Less likely ACS.  Weakness and dizziness - likely 2/2 to not eating, also dehydrated given low water intake with 5 cups of coffee - dizziness resolved with IVF resuscitation.  -arranged meals on wheels to help with meals. - Pt advised to drink 2 cups of water for each cup of coffee. Pt agreeable to increasing water intake.  - PT consulted: recommends home PT.  CHF  - Echo (6/08) showed EF 55-60% with grade I diastolic dysfunction, mild LAE.  - doesn't appear to be volume overloaded now. Will hold home lasix for now as she is dry.  - don't feel necessary to repeat ECHO this time.  CAD  - Left heart cath in 2004 showed 70% ostial LAD stenosis and 50-60% proximal ramus disease. It was decided to manage her medically. Myoview (5/11) showed EF 74% and was a normal  study.  - continue plavix. Cont simvastatin.   Acute confusion/unresponsiveness - could be 2/2 to absence seizure.  -neuro consulted for possible seizure while on 750 mg BID keppra. Doesn't feel like this was seizure.  - doesn't recommend further workup for seizure. Continue current keppra dose 750mg  bid.   Discharge Vitals:   BP 120/52  Pulse 58  Temp(Src) 98.5 F (36.9 C) (Oral)  Resp 19  Ht 5\' 2"  (1.575 m)  Wt 57.7 kg (127 lb 3.3 oz)  BMI 23.26 kg/m2  SpO2 97%  Discharge Labs:  Results for orders placed during the hospital encounter of 04/08/14 (from the past 24 hour(s))  TSH     Status: None   Collection Time    04/08/14  4:07 PM      Result Value Ref Range   TSH 1.960  0.350 - 4.500 uIU/mL  TROPONIN I     Status: None   Collection Time    04/08/14  4:07 PM      Result Value Ref Range   Troponin I <0.30  <0.30 ng/mL  TROPONIN I     Status: None   Collection Time    04/08/14  8:40 PM      Result Value Ref Range   Troponin I <0.30  <0.30 ng/mL  URINE RAPID DRUG SCREEN (HOSP PERFORMED)     Status: None   Collection Time    04/08/14 10:22 PM      Result Value Ref Range   Opiates NONE DETECTED  NONE DETECTED   Cocaine NONE DETECTED  NONE DETECTED   Benzodiazepines NONE DETECTED  NONE DETECTED   Amphetamines NONE DETECTED  NONE DETECTED   Tetrahydrocannabinol NONE DETECTED  NONE DETECTED   Barbiturates NONE DETECTED  NONE DETECTED  BASIC METABOLIC PANEL     Status: Abnormal   Collection Time    04/09/14  4:10 AM  Result Value Ref Range   Sodium 139  137 - 147 mEq/L   Potassium 4.6  3.7 - 5.3 mEq/L   Chloride 105  96 - 112 mEq/L   CO2 24  19 - 32 mEq/L   Glucose, Bld 94  70 - 99 mg/dL   BUN 11  6 - 23 mg/dL   Creatinine, Ser 9.56 (*) 0.50 - 1.10 mg/dL   Calcium 8.8  8.4 - 21.3 mg/dL   GFR calc non Af Amer 38 (*) >90 mL/min   GFR calc Af Amer 44 (*) >90 mL/min   Anion gap 10  5 - 15  CBC     Status: Abnormal   Collection Time    04/09/14  4:10 AM       Result Value Ref Range   WBC 7.3  4.0 - 10.5 K/uL   RBC 3.38 (*) 3.87 - 5.11 MIL/uL   Hemoglobin 10.3 (*) 12.0 - 15.0 g/dL   HCT 08.6 (*) 57.8 - 46.9 %   MCV 92.3  78.0 - 100.0 fL   MCH 30.5  26.0 - 34.0 pg   MCHC 33.0  30.0 - 36.0 g/dL   RDW 62.9  52.8 - 41.3 %   Platelets 268  150 - 400 K/uL    Signed: [name], MD 04/09/2014, 4:05 PM    Services Ordered on Discharge: home rn, aide, home PT Equipment Ordered on Discharge:

## 2014-04-09 NOTE — Progress Notes (Signed)
  I have seen and examined the patient, and reviewed the daily progress note by [facility] and discussed the care of the patient with them. Please see my progress note from 04/09/2014 for further details regarding assessment and plan.    Signed:  Hyacinth Meekerasrif Maressa Apollo, MD 04/09/2014, 5:41 PM

## 2014-04-11 LAB — LEVETIRACETAM LEVEL: Levetiracetam Lvl: 46.6 ug/mL

## 2014-04-30 ENCOUNTER — Encounter: Payer: Self-pay | Admitting: Physician Assistant

## 2014-05-01 ENCOUNTER — Encounter: Payer: Self-pay | Admitting: Physician Assistant

## 2014-05-01 ENCOUNTER — Encounter: Payer: Medicare HMO | Admitting: Physician Assistant

## 2014-05-01 NOTE — Progress Notes (Signed)
  This encounter was created in error - please disregard. Pt cancelled.

## 2014-05-08 ENCOUNTER — Telehealth: Payer: Self-pay | Admitting: Hematology

## 2014-05-08 NOTE — Telephone Encounter (Signed)
S/w pt advised appt chg on 9/25 from 1pm to 11am due to md meeting. Pt verbalized understanding.

## 2014-05-14 ENCOUNTER — Encounter: Payer: Medicare HMO | Admitting: Physician Assistant

## 2014-05-16 ENCOUNTER — Other Ambulatory Visit: Payer: Medicare HMO

## 2014-05-16 ENCOUNTER — Ambulatory Visit: Payer: Medicare HMO

## 2014-08-21 ENCOUNTER — Emergency Department (HOSPITAL_COMMUNITY)
Admission: EM | Admit: 2014-08-21 | Discharge: 2014-08-21 | Disposition: A | Discharge status: Home / Self Care | Payer: Medicare HMO | Attending: Emergency Medicine | Admitting: Emergency Medicine

## 2014-08-21 DIAGNOSIS — F329 Major depressive disorder, single episode, unspecified: Secondary | ICD-10-CM | POA: Insufficient documentation

## 2014-08-21 DIAGNOSIS — J449 Chronic obstructive pulmonary disease, unspecified: Secondary | ICD-10-CM | POA: Insufficient documentation

## 2014-08-21 DIAGNOSIS — Z7902 Long term (current) use of antithrombotics/antiplatelets: Secondary | ICD-10-CM | POA: Insufficient documentation

## 2014-08-21 DIAGNOSIS — I5032 Chronic diastolic (congestive) heart failure: Secondary | ICD-10-CM | POA: Insufficient documentation

## 2014-08-21 DIAGNOSIS — R112 Nausea with vomiting, unspecified: Secondary | ICD-10-CM | POA: Diagnosis not present

## 2014-08-21 DIAGNOSIS — Z87891 Personal history of nicotine dependence: Secondary | ICD-10-CM | POA: Insufficient documentation

## 2014-08-21 DIAGNOSIS — R197 Diarrhea, unspecified: Secondary | ICD-10-CM | POA: Insufficient documentation

## 2014-08-21 DIAGNOSIS — N183 Chronic kidney disease, stage 3 (moderate): Secondary | ICD-10-CM | POA: Diagnosis not present

## 2014-08-21 DIAGNOSIS — I129 Hypertensive chronic kidney disease with stage 1 through stage 4 chronic kidney disease, or unspecified chronic kidney disease: Secondary | ICD-10-CM | POA: Diagnosis not present

## 2014-08-21 DIAGNOSIS — I2510 Atherosclerotic heart disease of native coronary artery without angina pectoris: Secondary | ICD-10-CM | POA: Diagnosis not present

## 2014-08-21 DIAGNOSIS — F419 Anxiety disorder, unspecified: Secondary | ICD-10-CM | POA: Insufficient documentation

## 2014-08-21 DIAGNOSIS — K219 Gastro-esophageal reflux disease without esophagitis: Secondary | ICD-10-CM | POA: Diagnosis not present

## 2014-08-21 DIAGNOSIS — Z862 Personal history of diseases of the blood and blood-forming organs and certain disorders involving the immune mechanism: Secondary | ICD-10-CM | POA: Diagnosis not present

## 2014-08-21 DIAGNOSIS — E785 Hyperlipidemia, unspecified: Secondary | ICD-10-CM | POA: Diagnosis not present

## 2014-08-21 DIAGNOSIS — R109 Unspecified abdominal pain: Secondary | ICD-10-CM | POA: Diagnosis present

## 2014-08-21 DIAGNOSIS — Z79899 Other long term (current) drug therapy: Secondary | ICD-10-CM | POA: Diagnosis not present

## 2014-08-21 LAB — CBC WITH DIFFERENTIAL/PLATELET
Basophils Absolute: 0 10*3/uL (ref 0.0–0.1)
Basophils Relative: 0 % (ref 0–1)
EOS ABS: 0.2 10*3/uL (ref 0.0–0.7)
Eosinophils Relative: 2 % (ref 0–5)
HEMATOCRIT: 34.8 % — AB (ref 36.0–46.0)
HEMOGLOBIN: 11.5 g/dL — AB (ref 12.0–15.0)
LYMPHS ABS: 2.7 10*3/uL (ref 0.7–4.0)
Lymphocytes Relative: 41 % (ref 12–46)
MCH: 31 pg (ref 26.0–34.0)
MCHC: 33 g/dL (ref 30.0–36.0)
MCV: 93.8 fL (ref 78.0–100.0)
MONOS PCT: 10 % (ref 3–12)
Monocytes Absolute: 0.7 10*3/uL (ref 0.1–1.0)
NEUTROS ABS: 3.1 10*3/uL (ref 1.7–7.7)
NEUTROS PCT: 47 % (ref 43–77)
Platelets: 326 10*3/uL (ref 150–400)
RBC: 3.71 MIL/uL — ABNORMAL LOW (ref 3.87–5.11)
RDW: 12.5 % (ref 11.5–15.5)
WBC: 6.6 10*3/uL (ref 4.0–10.5)

## 2014-08-21 LAB — COMPREHENSIVE METABOLIC PANEL
ALT: 14 U/L (ref 0–35)
ANION GAP: 11 (ref 5–15)
AST: 25 U/L (ref 0–37)
Albumin: 4.3 g/dL (ref 3.5–5.2)
Alkaline Phosphatase: 51 U/L (ref 39–117)
BUN: 15 mg/dL (ref 6–23)
CO2: 24 mmol/L (ref 19–32)
Calcium: 9.6 mg/dL (ref 8.4–10.5)
Chloride: 100 mEq/L (ref 96–112)
Creatinine, Ser: 1.2 mg/dL — ABNORMAL HIGH (ref 0.50–1.10)
GFR calc Af Amer: 46 mL/min — ABNORMAL LOW (ref >90)
GFR, EST NON AFRICAN AMERICAN: 40 mL/min — AB (ref >90)
Glucose, Bld: 108 mg/dL — ABNORMAL HIGH (ref 70–99)
POTASSIUM: 3.9 mmol/L (ref 3.5–5.1)
SODIUM: 135 mmol/L (ref 135–145)
Total Bilirubin: 0.6 mg/dL (ref 0.3–1.2)
Total Protein: 7 g/dL (ref 6.0–8.3)

## 2014-08-21 LAB — LIPASE, BLOOD: Lipase: 40 U/L (ref 11–59)

## 2014-08-21 LAB — URINALYSIS, ROUTINE W REFLEX MICROSCOPIC
BILIRUBIN URINE: NEGATIVE
Glucose, UA: NEGATIVE mg/dL
HGB URINE DIPSTICK: NEGATIVE
Ketones, ur: NEGATIVE mg/dL
Leukocytes, UA: NEGATIVE
Nitrite: NEGATIVE
PH: 5 (ref 5.0–8.0)
Protein, ur: NEGATIVE mg/dL
Specific Gravity, Urine: 1.01 (ref 1.005–1.030)
Urobilinogen, UA: 0.2 mg/dL (ref 0.0–1.0)

## 2014-08-21 LAB — I-STAT CG4 LACTIC ACID, ED: Lactic Acid, Venous: 2.14 mmol/L (ref 0.5–2.2)

## 2014-08-21 LAB — I-STAT TROPONIN, ED: Troponin i, poc: 0.01 ng/mL (ref 0.00–0.08)

## 2014-08-21 MED ORDER — LOPERAMIDE HCL 2 MG PO CAPS
2.0000 mg | ORAL_CAPSULE | Freq: Once | ORAL | Status: AC
  Administered 2014-08-21: 2 mg via ORAL
  Filled 2014-08-21: qty 1

## 2014-08-21 MED ORDER — ONDANSETRON HCL 4 MG/2ML IJ SOLN
4.0000 mg | Freq: Once | INTRAMUSCULAR | Status: AC
  Administered 2014-08-21: 4 mg via INTRAVENOUS
  Filled 2014-08-21 (×2): qty 2

## 2014-08-21 MED ORDER — LOPERAMIDE HCL 2 MG PO CAPS: 2.0000 mg | ORAL_CAPSULE | Freq: Four times a day (QID) | ORAL | Status: DC | PRN

## 2014-08-21 MED ORDER — ONDANSETRON 4 MG PO TBDP: 4.0000 mg | ORAL_TABLET | Freq: Three times a day (TID) | ORAL | Status: DC | PRN

## 2014-08-21 NOTE — ED Provider Notes (Signed)
TIME SEEN: 1:30 PM  CHIEF COMPLAINT: Nausea, vomiting and diarrhea  HPI: Pt is a 80 y.o. F with history of coronary artery disease, hypertension, hyperlipidemia, CHF, COPD, CKD presents emergency department with nausea, vomiting and diarrhea that started an hour ago. She states she started not feeling well around 4 AM. Denies any fevers or chills. Has some abdominal cramping. No placed or melena. No dysuria or hematuria. No sick contacts or recent travel. She lives at home alone.  ROS: See HPI Constitutional: no fever  Eyes: no drainage  ENT: no runny nose   Cardiovascular:  no chest pain  Resp: no SOB  GI:  vomiting GU: no dysuria Integumentary: no rash  Allergy: no hives  Musculoskeletal: no leg swelling  Neurological: no slurred speech ROS otherwise negative  PAST MEDICAL HISTORY/PAST SURGICAL HISTORY:  Past Medical History  Diagnosis Date  . Fatigue   . Anxiety   . Dizziness   . CAD (coronary artery disease)     a. Cath 2004: 70% ostial LAD, 50-60% prox ramus disease, treated medically. b. Nuc 12/2009 - EF 74%, normal study.  . Hypertension   . Hyperlipidemia   . Coronary artery disease   . Anemia     a. Chronic iron deficiency anemia  . Syncope and collapse     RECURRENT, RELATED TO HYPONATREMIA, NARCOTICS, AND BRADYCARDIA  . Tobacco abuse     PAST  . Memory disorder   . Seizure disorder   . Depression   . Chronic diastolic CHF (congestive heart failure)     a. Echo 02/2014: mild focal basal hypertrophy of septum, Normal LV function EF 55-60%; grade 1 diastolic dysfunction; mildly elevated PASP.  Marland Kitchen. GERD (gastroesophageal reflux disease)   . COPD (chronic obstructive pulmonary disease)   . CKD (chronic kidney disease), stage III     MEDICATIONS:  Prior to Admission medications   Medication Sig Start Date End Date Taking? Authorizing Provider  acetaminophen (TYLENOL) 500 MG tablet Take 1,000 mg by mouth every 6 (six) hours as needed. For pain.    Historical  Provider, MD  albuterol (PROVENTIL) (2.5 MG/3ML) 0.083% nebulizer solution Take 3 mLs (2.5 mg total) by nebulization every 6 (six) hours as needed for wheezing. 04/09/14   Hyacinth Meekerasrif Ahmed, MD  Calcium Carbonate-Vitamin D (CALCIUM 600 + D PO) Take 1 tablet by mouth daily.     Historical Provider, MD  clopidogrel (PLAVIX) 75 MG tablet Take 75 mg by mouth daily.    Historical Provider, MD  escitalopram (LEXAPRO) 10 MG tablet Take 10 mg by mouth daily.    Historical Provider, MD  isosorbide mononitrate (IMDUR) 30 MG 24 hr tablet Take 30 mg by mouth daily.     Historical Provider, MD  levETIRAcetam (KEPPRA) 500 MG tablet Take 750 mg by mouth every 12 (twelve) hours.  06/28/12   Rebecca S Tat, DO  LORazepam (ATIVAN) 0.5 MG tablet Take 0.5 mg by mouth every 6 (six) hours as needed for anxiety.    Historical Provider, MD  Multiple Vitamin (MULTIVITAMIN WITH MINERALS) TABS Take 1 tablet by mouth daily.    Historical Provider, MD  nitroGLYCERIN (NITROSTAT) 0.4 MG SL tablet Place 0.4 mg under the tongue every 5 (five) minutes as needed for chest pain.    Historical Provider, MD  pravastatin (PRAVACHOL) 40 MG tablet Take 40 mg by mouth daily.    Historical Provider, MD  tiotropium (SPIRIVA) 18 MCG inhalation capsule Place 18 mcg into inhaler and inhale daily.    Historical  Provider, MD    ALLERGIES:  Allergies  Allergen Reactions  . Codeine Nausea And Vomiting    SOCIAL HISTORY:  History  Substance Use Topics  . Smoking status: Former Smoker -- 1.00 packs/day for 40 years    Quit date: 08/23/1999  . Smokeless tobacco: Never Used  . Alcohol Use: No    FAMILY HISTORY: Family History  Problem Relation Age of Onset  . Heart attack Father   . Heart failure Mother   . CAD Father   . Congestive Heart Failure Mother   . Other Brother     cerebral hemmorhage  . Coronary artery disease Sister     EXAM: BP 134/105 mmHg  Pulse 73  Temp(Src) 97.6 F (36.4 C) (Oral)  Resp 20  SpO2  99% CONSTITUTIONAL: Alert and oriented and responds appropriately to questions. Well-appearing; well-nourished elderly, pleasant, nontoxic HEAD: Normocephalic EYES: Conjunctivae clear, PERRL ENT: normal nose; no rhinorrhea; moist mucous membranes; pharynx without lesions noted NECK: Supple, no meningismus, no LAD  CARD: RRR; S1 and S2 appreciated; no murmurs, no clicks, no rubs, no gallops RESP: Normal chest excursion without splinting or tachypnea; breath sounds clear and equal bilaterally; no wheezes, no rhonchi, no rales,  ABD/GI: Normal bowel sounds; non-distended; soft, non-tender, no rebound, no guarding, no peritoneal signs BACK:  The back appears normal and is non-tender to palpation, there is no CVA tenderness EXT: Normal ROM in all joints; non-tender to palpation; no edema; normal capillary refill; no cyanosis    SKIN: Normal color for age and race; warm NEURO: Moves all extremities equally PSYCH: The patient's mood and manner are appropriate. Grooming and personal hygiene are appropriate.  MEDICAL DECISION MAKING: Patient here with nausea, vomiting and diarrhea. They be viral illness. She is very well-appearing, pleasant, nontoxic on exam and hemodynamically stable. Her abdominal exam is also benign. We'll treat symptomatically with IV fluids, Zofran and Imodium. Will obtain abdominal labs, urinalysis and closely monitor. We'll by mouth challenge.  ED PROGRESS: Patient's labs are unremarkable. Troponin negative. Lactate 2.1. She is asking to drink coffee.   Patient has chronic kidney disease with a creatinine of 1.2 which is near baseline. She has received IV fluids. Urine shows no sign of infection or ketones. No vomiting or diarrhea in the ED. She has tolerated by mouth. I feel she is safe to be discharged home. Her abdominal exam is still benign. Discussed return precautions. We'll discharge with prescriptions for Zofran and Imodium to use as needed. She verbalized understanding and  is comfortable with plan.     EKG Interpretation  Date/Time:  Thursday August 21 2014 14:22:35 EST Ventricular Rate:  62 PR Interval:  162 QRS Duration: 67 QT Interval:  434 QTC Calculation: 441 R Axis:   26 Text Interpretation:  Sinus rhythm No significant change since last tracing Confirmed by WARD,  DO, KRISTEN [phone]([id number]) on 08/21/2014 2:25:08 PM        Layla MawKristen N Ward, DO 08/21/14 1607

## 2014-08-21 NOTE — ED Notes (Signed)
She is unable to urinate at this time

## 2014-08-21 NOTE — ED Notes (Signed)
Bed: [id number] Expected date:  Expected time:  Means of arrival:  Comments: Ems- abdominal pain

## 2014-08-21 NOTE — ED Notes (Signed)
Pt's sister en route to pick up pt. Will be here in 30-40 minutes.

## 2014-08-21 NOTE — Discharge Instructions (Signed)

## 2014-08-21 NOTE — ED Notes (Signed)
I walked patient to bathroom and she was unable to urinate, we will try again soon

## 2014-08-21 NOTE — ED Notes (Signed)
Called pt family again, spoke with a  Man who says ride should be here any minute.

## 2014-08-21 NOTE — ED Notes (Addendum)
Per ems pt is from home, lives alone. Pt c/o abdominal pain, nausea, vomiting, and diarrhea. Diarrhea in bed when ems arrived. Pt did not feel well yesterday, but n/v/d started 1 hour ago. Pale and diaphoretic upon ems arrival. 20 g L forearm. 4 mg zofran IV given.

## 2014-10-03 ENCOUNTER — Emergency Department (HOSPITAL_COMMUNITY): Payer: Medicare HMO

## 2014-10-03 ENCOUNTER — Encounter (HOSPITAL_COMMUNITY): Payer: Self-pay | Admitting: Emergency Medicine

## 2014-10-03 ENCOUNTER — Emergency Department (HOSPITAL_COMMUNITY)
Admission: EM | Admit: 2014-10-03 | Discharge: 2014-10-04 | Disposition: A | Discharge status: Home / Self Care | Payer: Medicare HMO | Attending: Emergency Medicine | Admitting: Emergency Medicine

## 2014-10-03 DIAGNOSIS — F419 Anxiety disorder, unspecified: Secondary | ICD-10-CM | POA: Insufficient documentation

## 2014-10-03 DIAGNOSIS — Z9889 Other specified postprocedural states: Secondary | ICD-10-CM | POA: Diagnosis not present

## 2014-10-03 DIAGNOSIS — Z79899 Other long term (current) drug therapy: Secondary | ICD-10-CM | POA: Insufficient documentation

## 2014-10-03 DIAGNOSIS — I129 Hypertensive chronic kidney disease with stage 1 through stage 4 chronic kidney disease, or unspecified chronic kidney disease: Secondary | ICD-10-CM | POA: Diagnosis not present

## 2014-10-03 DIAGNOSIS — E785 Hyperlipidemia, unspecified: Secondary | ICD-10-CM | POA: Diagnosis not present

## 2014-10-03 DIAGNOSIS — I509 Heart failure, unspecified: Secondary | ICD-10-CM | POA: Diagnosis not present

## 2014-10-03 DIAGNOSIS — Z87891 Personal history of nicotine dependence: Secondary | ICD-10-CM | POA: Insufficient documentation

## 2014-10-03 DIAGNOSIS — Z862 Personal history of diseases of the blood and blood-forming organs and certain disorders involving the immune mechanism: Secondary | ICD-10-CM | POA: Insufficient documentation

## 2014-10-03 DIAGNOSIS — G40909 Epilepsy, unspecified, not intractable, without status epilepticus: Secondary | ICD-10-CM | POA: Insufficient documentation

## 2014-10-03 DIAGNOSIS — Z8719 Personal history of other diseases of the digestive system: Secondary | ICD-10-CM | POA: Insufficient documentation

## 2014-10-03 DIAGNOSIS — R0600 Dyspnea, unspecified: Secondary | ICD-10-CM | POA: Insufficient documentation

## 2014-10-03 DIAGNOSIS — R0602 Shortness of breath: Secondary | ICD-10-CM | POA: Diagnosis present

## 2014-10-03 DIAGNOSIS — J441 Chronic obstructive pulmonary disease with (acute) exacerbation: Secondary | ICD-10-CM | POA: Insufficient documentation

## 2014-10-03 DIAGNOSIS — I2510 Atherosclerotic heart disease of native coronary artery without angina pectoris: Secondary | ICD-10-CM | POA: Insufficient documentation

## 2014-10-03 DIAGNOSIS — F329 Major depressive disorder, single episode, unspecified: Secondary | ICD-10-CM | POA: Insufficient documentation

## 2014-10-03 DIAGNOSIS — N183 Chronic kidney disease, stage 3 (moderate): Secondary | ICD-10-CM | POA: Diagnosis not present

## 2014-10-03 LAB — CBC WITH DIFFERENTIAL/PLATELET
BASOS PCT: 0 % (ref 0–1)
Basophils Absolute: 0 10*3/uL (ref 0.0–0.1)
EOS ABS: 0.2 10*3/uL (ref 0.0–0.7)
Eosinophils Relative: 2 % (ref 0–5)
HEMATOCRIT: 35.8 % — AB (ref 36.0–46.0)
HEMOGLOBIN: 12.3 g/dL (ref 12.0–15.0)
Lymphocytes Relative: 50 % — ABNORMAL HIGH (ref 12–46)
Lymphs Abs: 4.4 10*3/uL — ABNORMAL HIGH (ref 0.7–4.0)
MCH: 31.5 pg (ref 26.0–34.0)
MCHC: 34.4 g/dL (ref 30.0–36.0)
MCV: 91.6 fL (ref 78.0–100.0)
Monocytes Absolute: 0.8 10*3/uL (ref 0.1–1.0)
Monocytes Relative: 9 % (ref 3–12)
NEUTROS ABS: 3.4 10*3/uL (ref 1.7–7.7)
NEUTROS PCT: 39 % — AB (ref 43–77)
Platelets: 311 10*3/uL (ref 150–400)
RBC: 3.91 MIL/uL (ref 3.87–5.11)
RDW: 12.4 % (ref 11.5–15.5)
WBC: 8.7 10*3/uL (ref 4.0–10.5)

## 2014-10-03 LAB — BASIC METABOLIC PANEL
ANION GAP: 9 (ref 5–15)
BUN: 17 mg/dL (ref 6–23)
CHLORIDE: 97 mmol/L (ref 96–112)
CO2: 28 mmol/L (ref 19–32)
Calcium: 9.8 mg/dL (ref 8.4–10.5)
Creatinine, Ser: 1.63 mg/dL — ABNORMAL HIGH (ref 0.50–1.10)
GFR calc Af Amer: 32 mL/min — ABNORMAL LOW (ref >90)
GFR calc non Af Amer: 28 mL/min — ABNORMAL LOW (ref >90)
Glucose, Bld: 90 mg/dL (ref 70–99)
Potassium: 4.1 mmol/L (ref 3.5–5.1)
Sodium: 134 mmol/L — ABNORMAL LOW (ref 135–145)

## 2014-10-03 LAB — BRAIN NATRIURETIC PEPTIDE: B NATRIURETIC PEPTIDE 5: 89.5 pg/mL (ref 0.0–100.0)

## 2014-10-03 LAB — I-STAT TROPONIN, ED: TROPONIN I, POC: 0.01 ng/mL (ref 0.00–0.08)

## 2014-10-03 MED ORDER — LORAZEPAM 2 MG/ML IJ SOLN
0.5000 mg | Freq: Once | INTRAMUSCULAR | Status: AC
  Administered 2014-10-03: 0.5 mg via INTRAVENOUS
  Filled 2014-10-03: qty 1

## 2014-10-03 NOTE — ED Notes (Signed)
Patient transported to X-ray 

## 2014-10-03 NOTE — ED Notes (Signed)
The patient started having SOB today has been having it since yesterday.  The patient said it resolved yesterday but it started again today at 1200hrs and it has been getting worse.  Patient called EMS because she was getting worse instead of better. EMS advised lungs are clear.  The patient also denied any pain, N/V or any other symptoms.

## 2014-10-03 NOTE — ED Provider Notes (Signed)
CSN: [id number]     Arrival date & time 10/03/14  2152 History   First MD Initiated Contact with Patient 10/03/14 2248     Chief Complaint  Patient presents with  . Shortness of Breath    The patient started having SOB today has been having it since yesterday.     (Consider location/radiation/quality/duration/timing/severity/associated sxs/prior Treatment) HPI Comments: The patient is a 81 year old female with past mental history of COPD, CAD, anxiety, depression, tobacco abuse, CK D presented to emergency department chief complaint of dyspnea since yesterday. Patient reports shortness of breath yesterday, self resolved. She reports onset of shortness of breath today at 11:00. She denies lower extremity edema, chest pain, cough, fever palpitations. Denies oxygen use at home.  Patient is a 80 y.o. female presenting with shortness of breath. The history is provided by the patient. No language interpreter was used.  Shortness of Breath Associated symptoms: no abdominal pain, no chest pain, no cough, no fever and no vomiting     Past Medical History  Diagnosis Date  . Fatigue   . Anxiety   . Dizziness   . CAD (coronary artery disease)     a. Cath 2004: 70% ostial LAD, 50-60% prox ramus disease, treated medically. b. Nuc 12/2009 - EF 74%, normal study.  . Hypertension   . Hyperlipidemia   . Coronary artery disease   . Anemia     a. Chronic iron deficiency anemia  . Syncope and collapse     RECURRENT, RELATED TO HYPONATREMIA, NARCOTICS, AND BRADYCARDIA  . Tobacco abuse     PAST  . Memory disorder   . Seizure disorder   . Depression   . Chronic diastolic CHF (congestive heart failure)     a. Echo 02/2014: mild focal basal hypertrophy of septum, Normal LV function EF 55-60%; grade 1 diastolic dysfunction; mildly elevated PASP.  Marland Kitchen GERD (gastroesophageal reflux disease)   . COPD (chronic obstructive pulmonary disease)   . CKD (chronic kidney disease), stage III    Past Surgical History   Procedure Laterality Date  . Cardiac catheterization  2004  . Abdominal hysterectomy      early 1970's for fibroid   Family History  Problem Relation Age of Onset  . Heart attack Father   . Heart failure Mother   . CAD Father   . Congestive Heart Failure Mother   . Other Brother     cerebral hemmorhage  . Coronary artery disease Sister    History  Substance Use Topics  . Smoking status: Former Smoker -- 1.00 packs/day for 40 years    Quit date: 08/23/1999  . Smokeless tobacco: Never Used  . Alcohol Use: No   OB History    No data available     Review of Systems  Constitutional: Negative for fever and chills.  Respiratory: Positive for shortness of breath. Negative for cough.   Cardiovascular: Negative for chest pain, palpitations and leg swelling.  Gastrointestinal: Negative for nausea, vomiting and abdominal pain.      Allergies  Codeine  Home Medications   Prior to Admission medications   Medication Sig Start Date End Date Taking? Authorizing Provider  acetaminophen (TYLENOL) 500 MG tablet Take 1,000 mg by mouth every 6 (six) hours as needed. For pain.    Historical Provider, MD  albuterol (PROVENTIL) (2.5 MG/3ML) 0.083% nebulizer solution Take 3 mLs (2.5 mg total) by nebulization every 6 (six) hours as needed for wheezing. Patient not taking: Reported on 08/21/2014 04/09/14   Tasrif  Ahmed, MD  Calcium Carbonate-Vitamin D (CALCIUM 600 + D PO) Take 1 tablet by mouth daily.     Historical Provider, MD  clopidogrel (PLAVIX) 75 MG tablet Take 75 mg by mouth daily.    Historical Provider, MD  diphenhydrAMINE (BENADRYL) 25 MG tablet Take 25 mg by mouth every 6 (six) hours as needed for allergies.    Historical Provider, MD  escitalopram (LEXAPRO) 10 MG tablet Take 10 mg by mouth daily.    Historical Provider, MD  furosemide (LASIX) 20 MG tablet Take 20 mg by mouth 2 (two) times daily.    Historical Provider, MD  isosorbide mononitrate (IMDUR) 30 MG 24 hr tablet Take 15  mg by mouth daily.     Historical Provider, MD  levETIRAcetam (KEPPRA) 500 MG tablet Take 750 mg by mouth every 12 (twelve) hours.  06/28/12   [name], DO  loperamide (IMODIUM) 2 MG capsule Take 1 capsule (2 mg total) by mouth 4 (four) times daily as needed for diarrhea or loose stools. 08/21/14   [name], DO  LORazepam (ATIVAN) 0.5 MG tablet Take 0.5 mg by mouth every 6 (six) hours as needed for anxiety.    Historical Provider, MD  nitroGLYCERIN (NITROSTAT) 0.4 MG SL tablet Place 0.4 mg under the tongue every 5 (five) minutes as needed for chest pain.    Historical Provider, MD  ondansetron (ZOFRAN ODT) 4 MG disintegrating tablet Take 1 tablet (4 mg total) by mouth every 8 (eight) hours as needed for nausea or vomiting. 08/21/14   [name], DO  pravastatin (PRAVACHOL) 40 MG tablet Take 40 mg by mouth daily with breakfast.     Historical Provider, MD  tiotropium (SPIRIVA) 18 MCG inhalation capsule Place 18 mcg into inhaler and inhale daily as needed (for shortness of breath).     Historical Provider, MD  traMADol (ULTRAM) 50 MG tablet Take 50 mg by mouth every 6 (six) hours as needed for moderate pain.    Historical Provider, MD   BP 142/53 mmHg  Pulse 62  Temp(Src) 98.2 F (36.8 C) (Oral)  Resp 27  SpO2 100% Physical Exam  Constitutional: She is oriented to person, place, and time. She appears well-developed and well-nourished. No distress.  HENT:  Head: Normocephalic and atraumatic.  Eyes: EOM are normal.  Neck: Neck supple.  Cardiovascular: Normal rate and regular rhythm.   Pulmonary/Chest: Effort normal. No respiratory distress. She has no wheezes. She has no rales. She exhibits no tenderness.  Patient is able to speak in complete sentences.   Abdominal: Soft. There is no tenderness. There is no rebound and no guarding.  Neurological: She is alert and oriented to person, place, and time.  Skin: Skin is warm and dry. She is not diaphoretic.  Psychiatric: Her speech is  normal and behavior is normal. Her mood appears anxious.  Nursing note and vitals reviewed.   ED Course  Procedures (including critical care time) Labs Review Labs Reviewed  CBC WITH DIFFERENTIAL/PLATELET - Abnormal; Notable for the following:    HCT 35.8 (*)    Neutrophils Relative % 39 (*)    Lymphocytes Relative 50 (*)    Lymphs Abs 4.4 (*)    All other components within normal limits  BASIC METABOLIC PANEL - Abnormal; Notable for the following:    Sodium 134 (*)    Creatinine, Ser 1.63 (*)    GFR calc non Af Amer 28 (*)    GFR calc Af Amer 32 (*)  All other components within normal limits  BRAIN NATRIURETIC PEPTIDE  I-STAT TROPOININ, ED    Imaging Review Dg Chest 2 View  10/03/2014   CLINICAL DATA:  The patient started having SOB today has been having it since yesterday. SOB and body aches.  EXAM: CHEST  2 VIEW  COMPARISON:  04/08/2014  FINDINGS: Stable cardiac size and mediastinal contours. Stable lung volumes. Chronic small to moderate hiatal hernia. No pneumothorax, edema, effusions or acute pulmonary opacity. Stable visualized osseous structures.  IMPRESSION: No active cardiopulmonary disease.   Electronically Signed   By: Odessa Fleming M.D.   On: 10/03/2014 22:42     Date: 10/03/2014  Rate: 59  Rhythm: sinus bradycardia  QRS Axis: normal  Intervals: normal  ST/T Wave abnormalities: normal  Conduction Disutrbances:none  Narrative Interpretation:   Old EKG Reviewed: unchanged    MDM   Final diagnoses:  Anxiety   Patient with complaints of dyspnea, likely secondary to anxiety. Chest very is negative for acute findings, negative troponin, negative BNP CBC and BMP without concerning abnormalities. EKG shows normal sinus rhythm.  EKG shows sinus bradycardia without other concerning abnormalities.  Discussed patient history, condition, results with Dr. Fayrene Fearing, after his evaluation the patient agrees likely due to anxiety. Given age and risk factors get a troponin on the  patient. Re-eval patient resting comfortably in room denies dyspnea at this time. Speaking complete sentences. Patient denies chest pain. She reports she feels better after "Dr. Fayrene Fearing put some medicine in her arm". Patient care assumed by Upstill, PA-C at discahrge.  Plant to repeat troponin and likely discharge home.  Meds given in ED:  Medications  LORazepam (ATIVAN) injection 0.5 mg (0.5 mg Intravenous Given 10/03/14 2358)    New Prescriptions   No medications on file       Mellody Drown, PA-C 10/04/14 0118  Rolland Porter, MD 10/08/14 [phone]

## 2014-10-04 LAB — TROPONIN I: Troponin I: <0.03 ng/mL (ref <0.031)

## 2014-10-04 MED ORDER — LORAZEPAM 0.5 MG PO TABS: 0.2500 mg | ORAL_TABLET | Freq: Two times a day (BID) | ORAL | Status: DC | PRN

## 2014-10-04 NOTE — ED Provider Notes (Addendum)
Patient seen and evaluated. History and physical reviewed. Clear lungs. Seems quite anxious. Afebrile. Reassuring study. Normal chest x-ray and EKG. Normal first troponin. No report of pain.  She states "my nerves are again to be my downfall".  Get a dose of Ativan IV. Plan will be delta troponin. Attempted to reassure her here. I believe that her symptoms are secondary to some anxiety.    Rolland PorterMark Arron Mcnaught, MD 10/04/14 0003  Rolland PorterMark Ervin Rothbauer, MD 10/04/14 [phone]

## 2014-10-04 NOTE — ED Notes (Signed)
[name] [name] (sister) [phone]

## 2014-10-04 NOTE — Discharge Instructions (Signed)

## 2014-10-04 NOTE — ED Provider Notes (Signed)
81 YO - sxs of anxiety = SOB, nervousness. Pending delta troponin Seen by Dr. Fayrene FearingJames. Ok to d/ch home if trop negative Follow up Dr. Cyndia BentBadger Tuesday. ?Ativan for home use  3:00 am: Re-eval:  She states she feels much better. Delta trop negative. She reports there is no one to pick her up until the morning. She will stay in A[facility] until family arrives to pick her up. Low dose Ativan for prn home use.   [name] HookerShari A Calaya Gildner, PA-C 10/04/14 0505  Rolland PorterMark James, MD 10/08/14 [phone]

## 2014-10-04 NOTE — ED Notes (Signed)
Patient is resting comfortably. 

## 2014-10-19 ENCOUNTER — Encounter (HOSPITAL_COMMUNITY): Payer: Self-pay | Admitting: *Deleted

## 2014-10-19 ENCOUNTER — Emergency Department (INDEPENDENT_AMBULATORY_CARE_PROVIDER_SITE_OTHER)
Admission: EM | Admit: 2014-10-19 | Discharge: 2014-10-19 | Disposition: A | Discharge status: Home / Self Care | Payer: Medicare HMO | Source: Home / Self Care | Attending: Emergency Medicine | Admitting: Emergency Medicine

## 2014-10-19 DIAGNOSIS — S46002A Unspecified injury of muscle(s) and tendon(s) of the rotator cuff of left shoulder, initial encounter: Secondary | ICD-10-CM

## 2014-10-19 MED ORDER — LIDOCAINE HCL (PF) 1 % IJ SOLN
INTRAMUSCULAR | Status: AC
  Filled 2014-10-19: qty 30

## 2014-10-19 MED ORDER — METHYLPREDNISOLONE ACETATE 40 MG/ML IJ SUSP
INTRAMUSCULAR | Status: AC
  Filled 2014-10-19: qty 1

## 2014-10-19 NOTE — ED Provider Notes (Signed)
CSN: [id number]     Arrival date & time 10/19/14  1708 History   First MD Initiated Contact with Patient 10/19/14 1858     Chief Complaint  Patient presents with  . Shoulder Pain  . Fall   (Consider location/radiation/quality/duration/timing/severity/associated sxs/prior Treatment) HPI She is an 81 year old woman here for evaluation of left shoulder pain after a fall. She states she fell a month ago. She was sitting on the commode when she had a syncopal event and fell on her left side. She reports pain throughout the shoulder and going down the front of her arm since then. She does report some days without pain. Pain is worse with overhead movements. She takes tramadol, which has not helped that much with the pain. No numbness or tingling. No chest pain or shortness of breath.  Past Medical History  Diagnosis Date  . Fatigue   . Anxiety   . Dizziness   . CAD (coronary artery disease)     a. Cath 2004: 70% ostial LAD, 50-60% prox ramus disease, treated medically. b. Nuc 12/2009 - EF 74%, normal study.  . Hypertension   . Hyperlipidemia   . Coronary artery disease   . Anemia     a. Chronic iron deficiency anemia  . Syncope and collapse     RECURRENT, RELATED TO HYPONATREMIA, NARCOTICS, AND BRADYCARDIA  . Tobacco abuse     PAST  . Memory disorder   . Seizure disorder   . Depression   . Chronic diastolic CHF (congestive heart failure)     a. Echo 02/2014: mild focal basal hypertrophy of septum, Normal LV function EF 55-60%; grade 1 diastolic dysfunction; mildly elevated PASP.  Marland Kitchen GERD (gastroesophageal reflux disease)   . COPD (chronic obstructive pulmonary disease)   . CKD (chronic kidney disease), stage III    Past Surgical History  Procedure Laterality Date  . Cardiac catheterization  2004  . Abdominal hysterectomy      early 1970's for fibroid   Family History  Problem Relation Age of Onset  . Heart attack Father   . Heart failure Mother   . CAD Father   . Congestive  Heart Failure Mother   . Other Brother     cerebral hemmorhage  . Coronary artery disease Sister    History  Substance Use Topics  . Smoking status: Former Smoker -- 1.00 packs/day for 40 years    Quit date: 08/23/1999  . Smokeless tobacco: Never Used  . Alcohol Use: No   OB History    No data available     Review of Systems As in history of present illness Allergies  Codeine  Home Medications   Prior to Admission medications   Medication Sig Start Date End Date Taking? Authorizing Provider  acetaminophen (TYLENOL) 500 MG tablet Take 1,000 mg by mouth every 6 (six) hours as needed. For pain.   Yes Historical Provider, MD  Calcium Carbonate-Vitamin D (CALCIUM 600 + D PO) Take 1 tablet by mouth daily.    Yes Historical Provider, MD  clopidogrel (PLAVIX) 75 MG tablet Take 75 mg by mouth daily.   Yes Historical Provider, MD  diphenhydrAMINE (BENADRYL) 25 MG tablet Take 25 mg by mouth every 6 (six) hours as needed for allergies.   Yes Historical Provider, MD  furosemide (LASIX) 20 MG tablet Take 20 mg by mouth 2 (two) times daily.   Yes Historical Provider, MD  levETIRAcetam (KEPPRA) 500 MG tablet Take 750 mg by mouth every 12 (twelve) hours.  06/28/12  Yes Rebecca S Tat, DO  LORazepam (ATIVAN) 0.5 MG tablet Take 0.5 tablets (0.25 mg total) by mouth 2 (two) times daily as needed for anxiety. 10/04/14  Yes Shari A Upstill, PA-C  nitroGLYCERIN (NITROSTAT) 0.4 MG SL tablet Place 0.4 mg under the tongue every 5 (five) minutes as needed for chest pain.   Yes Historical Provider, MD  tiotropium (SPIRIVA) 18 MCG inhalation capsule Place 18 mcg into inhaler and inhale daily as needed (for shortness of breath).    Yes Historical Provider, MD  traMADol (ULTRAM) 50 MG tablet Take 50 mg by mouth every 6 (six) hours as needed for moderate pain.   Yes Historical Provider, MD  albuterol (PROVENTIL) (2.5 MG/3ML) 0.083% nebulizer solution Take 3 mLs (2.5 mg total) by nebulization every 6 (six) hours as  needed for wheezing. Patient not taking: Reported on 08/21/2014 04/09/14   Hyacinth Meekerasrif Ahmed, MD  escitalopram (LEXAPRO) 10 MG tablet Take 10 mg by mouth daily.    Historical Provider, MD  isosorbide mononitrate (IMDUR) 30 MG 24 hr tablet Take 15 mg by mouth daily.     Historical Provider, MD  loperamide (IMODIUM) 2 MG capsule Take 1 capsule (2 mg total) by mouth 4 (four) times daily as needed for diarrhea or loose stools. 08/21/14   [name], DO  ondansetron (ZOFRAN ODT) 4 MG disintegrating tablet Take 1 tablet (4 mg total) by mouth every 8 (eight) hours as needed for nausea or vomiting. 08/21/14   [name], DO  pravastatin (PRAVACHOL) 40 MG tablet Take 40 mg by mouth daily with breakfast.     Historical Provider, MD   BP 143/74 mmHg  Pulse 78  Temp(Src) 98 F (36.7 C) (Oral)  Resp 16  SpO2 96% Physical Exam  Constitutional: She is oriented to person, place, and time. She appears well-developed and well-nourished. No distress.  Neck: Neck supple.  Cardiovascular: Normal rate.   Pulmonary/Chest: Effort normal.  Musculoskeletal:  Left shoulder: No obvious swelling or redness. She is tender over the bicipital groove and diffusely around the acromion. She has full range of motion. Positive empty can and Hawkins. Pain with resisted pronation.  Neurological: She is alert and oriented to person, place, and time.    ED Course  Injection of joint Date/Time: 10/19/2014 7:49 PM Performed by: Charm RingsHONIG, Khristy Kalan J Authorized by: Charm RingsHONIG, Finnigan Warriner J Consent: Verbal consent obtained. Risks and benefits: risks, benefits and alternatives were discussed Consent given by: patient Patient understanding: patient states understanding of the procedure being performed Patient identity confirmed: verbally with patient Time out: Immediately prior to procedure a "time out" was called to verify the correct patient, procedure, equipment, support staff and site/side marked as required. Local anesthesia used: yes (cold  spray) Patient tolerance: Patient tolerated the procedure well with no immediate complications Comments: Skin was prepped with alcohol. Cold spray was used for local anesthesia. 40 mg Depo-Medrol and 4 mL of 1% lidocaine was injected into the left subacromion bursa via posterior approach.  Patient tolerated the procedure well.   (including critical care time) Labs Review Labs Reviewed - No data to display  Imaging Review No results found.   MDM   1. Injury of left rotator cuff, initial encounter    Cortisone injection done today. Recommended ice. She can take her tramadol as needed for pain. Follow-up with PCP or orthopedics if no improvement in 1 week.    Charm RingsErin J Kemp Gomes, MD 10/19/14 [phone]

## 2014-10-19 NOTE — Discharge Instructions (Signed)
We did a cortisone injection in your shoulder. Put ice on your shoulder tonight. You should feel better in the next 1-2 days. Do gentle range of motion exercises for your shoulder. If you are not feeling any better in the next week, please follow-up with orthopedics.

## 2014-10-19 NOTE — ED Notes (Signed)
Larey SeatFell 1/31 and injured L shoulder.  She also had a gash above her L eye but it has already healed.  Hurts to move her arm

## 2014-10-25 ENCOUNTER — Encounter (HOSPITAL_COMMUNITY): Payer: Self-pay | Admitting: Physical Medicine and Rehabilitation

## 2014-10-25 ENCOUNTER — Emergency Department (HOSPITAL_COMMUNITY): Payer: Medicare HMO

## 2014-10-25 ENCOUNTER — Emergency Department (HOSPITAL_COMMUNITY)
Admission: EM | Admit: 2014-10-25 | Discharge: 2014-10-25 | Disposition: A | Discharge status: Home / Self Care | Payer: Medicare HMO | Attending: Emergency Medicine | Admitting: Emergency Medicine

## 2014-10-25 DIAGNOSIS — I5032 Chronic diastolic (congestive) heart failure: Secondary | ICD-10-CM | POA: Insufficient documentation

## 2014-10-25 DIAGNOSIS — E785 Hyperlipidemia, unspecified: Secondary | ICD-10-CM | POA: Insufficient documentation

## 2014-10-25 DIAGNOSIS — G40909 Epilepsy, unspecified, not intractable, without status epilepticus: Secondary | ICD-10-CM | POA: Diagnosis not present

## 2014-10-25 DIAGNOSIS — Z862 Personal history of diseases of the blood and blood-forming organs and certain disorders involving the immune mechanism: Secondary | ICD-10-CM | POA: Insufficient documentation

## 2014-10-25 DIAGNOSIS — Z87891 Personal history of nicotine dependence: Secondary | ICD-10-CM | POA: Insufficient documentation

## 2014-10-25 DIAGNOSIS — Z9889 Other specified postprocedural states: Secondary | ICD-10-CM | POA: Insufficient documentation

## 2014-10-25 DIAGNOSIS — J449 Chronic obstructive pulmonary disease, unspecified: Secondary | ICD-10-CM | POA: Diagnosis not present

## 2014-10-25 DIAGNOSIS — F329 Major depressive disorder, single episode, unspecified: Secondary | ICD-10-CM | POA: Insufficient documentation

## 2014-10-25 DIAGNOSIS — I2510 Atherosclerotic heart disease of native coronary artery without angina pectoris: Secondary | ICD-10-CM | POA: Diagnosis not present

## 2014-10-25 DIAGNOSIS — M25522 Pain in left elbow: Secondary | ICD-10-CM | POA: Insufficient documentation

## 2014-10-25 DIAGNOSIS — N183 Chronic kidney disease, stage 3 (moderate): Secondary | ICD-10-CM | POA: Diagnosis not present

## 2014-10-25 DIAGNOSIS — I129 Hypertensive chronic kidney disease with stage 1 through stage 4 chronic kidney disease, or unspecified chronic kidney disease: Secondary | ICD-10-CM | POA: Insufficient documentation

## 2014-10-25 DIAGNOSIS — Z8719 Personal history of other diseases of the digestive system: Secondary | ICD-10-CM | POA: Diagnosis not present

## 2014-10-25 DIAGNOSIS — M25512 Pain in left shoulder: Secondary | ICD-10-CM | POA: Diagnosis not present

## 2014-10-25 DIAGNOSIS — F419 Anxiety disorder, unspecified: Secondary | ICD-10-CM | POA: Insufficient documentation

## 2014-10-25 MED ORDER — HYDROCODONE-ACETAMINOPHEN 5-325 MG PO TABS: 1.0000 | ORAL_TABLET | Freq: Four times a day (QID) | ORAL | Status: DC | PRN

## 2014-10-25 MED ORDER — HYDROCODONE-ACETAMINOPHEN 5-325 MG PO TABS
1.0000 | ORAL_TABLET | Freq: Once | ORAL | Status: AC
  Administered 2014-10-25: 1 via ORAL
  Filled 2014-10-25: qty 1

## 2014-10-25 NOTE — ED Notes (Signed)
Pt presents to department for evaluation of L shoulder and L elbow pain. States she fell at home several weeks ago. Now states continued pain, no relief with medications at home. Pt is alert and oriented x4. NAD.

## 2014-10-25 NOTE — ED Notes (Signed)
Pt assisted to bathroom

## 2014-10-25 NOTE — ED Provider Notes (Signed)
CSN: [id number]     Arrival date & time 10/25/14  1359 History   First MD Initiated Contact with Patient 10/25/14 1508     Chief Complaint  Patient presents with  . Shoulder Pain  . Elbow Pain     (Consider location/radiation/quality/duration/timing/severity/associated sxs/prior Treatment) Patient is a 81 y.o. female presenting with shoulder injury. The history is provided by the patient.  Shoulder Injury This is a new problem. The current episode started more than 1 week ago. Episode frequency: once. The problem has not changed since onset.Pertinent negatives include no chest pain, no abdominal pain, no headaches and no shortness of breath. Nothing aggravates the symptoms. Nothing relieves the symptoms. Treatments tried: tramadol. The treatment provided mild relief.    Past Medical History  Diagnosis Date  . Fatigue   . Anxiety   . Dizziness   . CAD (coronary artery disease)     a. Cath 2004: 70% ostial LAD, 50-60% prox ramus disease, treated medically. b. Nuc 12/2009 - EF 74%, normal study.  . Hypertension   . Hyperlipidemia   . Coronary artery disease   . Anemia     a. Chronic iron deficiency anemia  . Syncope and collapse     RECURRENT, RELATED TO HYPONATREMIA, NARCOTICS, AND BRADYCARDIA  . Tobacco abuse     PAST  . Memory disorder   . Seizure disorder   . Depression   . Chronic diastolic CHF (congestive heart failure)     a. Echo 02/2014: mild focal basal hypertrophy of septum, Normal LV function EF 55-60%; grade 1 diastolic dysfunction; mildly elevated PASP.  Marland Kitchen GERD (gastroesophageal reflux disease)   . COPD (chronic obstructive pulmonary disease)   . CKD (chronic kidney disease), stage III    Past Surgical History  Procedure Laterality Date  . Cardiac catheterization  2004  . Abdominal hysterectomy      early 1970's for fibroid   Family History  Problem Relation Age of Onset  . Heart attack Father   . Heart failure Mother   . CAD Father   . Congestive Heart  Failure Mother   . Other Brother     cerebral hemmorhage  . Coronary artery disease Sister    History  Substance Use Topics  . Smoking status: Former Smoker -- 1.00 packs/day for 40 years    Quit date: 08/23/1999  . Smokeless tobacco: Never Used  . Alcohol Use: No   OB History    No data available     Review of Systems  Constitutional: Negative for fever and fatigue.  HENT: Negative for congestion and drooling.   Eyes: Negative for pain.  Respiratory: Negative for cough and shortness of breath.   Cardiovascular: Negative for chest pain.  Gastrointestinal: Negative for nausea, vomiting, abdominal pain and diarrhea.  Genitourinary: Negative for dysuria and hematuria.  Musculoskeletal: Negative for back pain, gait problem and neck pain.  Skin: Negative for color change.  Neurological: Negative for dizziness and headaches.  Hematological: Negative for adenopathy.  Psychiatric/Behavioral: Negative for behavioral problems.  All other systems reviewed and are negative.     Allergies  Codeine  Home Medications   Prior to Admission medications   Medication Sig Start Date End Date Taking? Authorizing Provider  acetaminophen (TYLENOL) 500 MG tablet Take 1,000 mg by mouth every 6 (six) hours as needed. For pain.   Yes Historical Provider, MD  Calcium Carbonate-Vitamin D (CALCIUM 600 + D PO) Take 1 tablet by mouth daily.    Yes Historical Provider,  MD  clopidogrel (PLAVIX) 75 MG tablet Take 75 mg by mouth daily.   Yes Historical Provider, MD  diphenhydrAMINE (BENADRYL) 25 MG tablet Take 25 mg by mouth every 6 (six) hours as needed for allergies.   Yes Historical Provider, MD  escitalopram (LEXAPRO) 10 MG tablet Take 10 mg by mouth daily.   Yes Historical Provider, MD  furosemide (LASIX) 20 MG tablet Take 20 mg by mouth 2 (two) times daily.   Yes Historical Provider, MD  isosorbide mononitrate (IMDUR) 30 MG 24 hr tablet Take 15 mg by mouth daily.    Yes Historical Provider, MD   levETIRAcetam (KEPPRA) 500 MG tablet Take 750 mg by mouth every 12 (twelve) hours.  06/28/12  Yes Rebecca S Tat, DO  loperamide (IMODIUM) 2 MG capsule Take 1 capsule (2 mg total) by mouth 4 (four) times daily as needed for diarrhea or loose stools. 08/21/14  Yes [name], DO  LORazepam (ATIVAN) 0.5 MG tablet Take 0.5 tablets (0.25 mg total) by mouth 2 (two) times daily as needed for anxiety. 10/04/14  Yes Shari A Upstill, PA-C  nitroGLYCERIN (NITROSTAT) 0.4 MG SL tablet Place 0.4 mg under the tongue every 5 (five) minutes as needed for chest pain.   Yes Historical Provider, MD  pravastatin (PRAVACHOL) 40 MG tablet Take 40 mg by mouth daily with breakfast.    Yes Historical Provider, MD  tiotropium (SPIRIVA) 18 MCG inhalation capsule Place 18 mcg into inhaler and inhale daily as needed (for shortness of breath).    Yes Historical Provider, MD  traMADol (ULTRAM) 50 MG tablet Take 50 mg by mouth every 6 (six) hours as needed for moderate pain.   Yes Historical Provider, MD  albuterol (PROVENTIL) (2.5 MG/3ML) 0.083% nebulizer solution Take 3 mLs (2.5 mg total) by nebulization every 6 (six) hours as needed for wheezing. Patient not taking: Reported on 08/21/2014 04/09/14   [name], MD  ondansetron (ZOFRAN ODT) 4 MG disintegrating tablet Take 1 tablet (4 mg total) by mouth every 8 (eight) hours as needed for nausea or vomiting. Patient not taking: Reported on 10/25/2014 08/21/14   [name], DO   BP 111/49 mmHg  Pulse 75  Temp(Src) 98.1 F (36.7 C)  Resp 14  SpO2 96% Physical Exam  Constitutional: She is oriented to person, place, and time. She appears well-developed and well-nourished.  HENT:  Head: Normocephalic.  Mouth/Throat: Oropharynx is clear and moist. No oropharyngeal exudate.  Eyes: Conjunctivae and EOM are normal. Pupils are equal, round, and reactive to light.  Neck: Normal range of motion. Neck supple.  Cardiovascular: Normal rate, regular rhythm, normal heart sounds and  intact distal pulses.  Exam reveals no gallop and no friction rub.   No murmur heard. Pulmonary/Chest: Effort normal and breath sounds normal. No respiratory distress. She has no wheezes.  Abdominal: Soft. Bowel sounds are normal. There is no tenderness. There is no rebound and no guarding.  Musculoskeletal: Normal range of motion. She exhibits no edema or tenderness.   Normal range of motion of the left shoulder and left elbow. Mild reproduction of symptoms with range of motion of the left shoulder.   2+ distal and proximal pulses in the upper extremities.   Normal strength and sensation in the upper extremities.   Normal appearance of the upper extremities without swelling or redness.  Neurological: She is alert and oriented to person, place, and time.  Skin: Skin is warm and dry.  Psychiatric: She has a normal mood and  affect. Her behavior is normal.  Nursing note and vitals reviewed.   ED Course  Procedures (including critical care time) Labs Review Labs Reviewed - No data to display  Imaging Review Dg Elbow Complete Left  10/25/2014   CLINICAL DATA:  Initial evaluation left elbow pain since falling 4 weeks ago  EXAM: LEFT ELBOW - COMPLETE 3+ VIEW  COMPARISON:  None.  FINDINGS: There is no evidence of fracture, dislocation, or joint effusion. There is no evidence of arthropathy or other focal bone abnormality. Soft tissues are unremarkable.  IMPRESSION: Negative.   Electronically Signed   By: Esperanza Heiraymond  Rubner M.D.   On: 10/25/2014 17:06   Dg Shoulder Left  10/25/2014   CLINICAL DATA:  Fall in bathroom 1 month ago. Persistent left shoulder pain. Initial encounter.  EXAM: LEFT SHOULDER - 2+ VIEW  COMPARISON:  None.  FINDINGS: There is no evidence of fracture or dislocation. There is no evidence of arthropathy or other focal bone abnormality. Generalized osteopenia noted. Soft tissues are unremarkable.  IMPRESSION: No acute findings.  Osteopenia.   Electronically Signed   By: Myles RosenthalJohn  Stahl  M.D.   On: 10/25/2014 17:10     EKG Interpretation   Date/Time:  Saturday October 25 2014 17:02:37 EST Ventricular Rate:  61 PR Interval:  173 QRS Duration: 69 QT Interval:  408 QTC Calculation: 411 R Axis:   49 Text Interpretation:  Sinus rhythm No significant change since last  tracing Confirmed by Beren Yniguez  MD, Cythnia Osmun ([id number]) on 10/25/2014 5:07:20 PM      MDM   Final diagnoses:  Left shoulder pain  Left elbow pain    4:24 PM 81 y.o. female w hx of CKD, COPD, HTN, CAD, sycnope, seizures on keppra  Who presents with left shoulder and left elbow pain for the last 5 weeks. She states that she fainted while sitting and urinating on the toilet about 5 weeks ago. She fell from the toilet hitting her left shoulder and elbow against the bathtub. She states that she also suffered a laceration to her left upper periorbital area. She states that she was not seen in the ER at that time. She has had ongoing left shoulder and left elbow pain since that time and was evaluated at urgent care and got a cortisone injection recently. She denies any significant relief with this. She has been taking tramadol without significant relief. She is afebrile and vital signs are unremarkable here. She has a normal exam. We'll get screening imaging and pain control with a Norco.  6:20 PM: I interpreted/reviewed the labs and/or imaging which were non-contributory.  Pt continues to appear well.  I have discussed the diagnosis/risks/treatment options with the patient and believe the pt to be eligible for discharge home to follow-up with her pcp. We also discussed returning to the ED immediately if new or worsening sx occur. We discussed the sx which are most concerning (e.g., worsening pain, fever) that necessitate immediate return. Medications administered to the patient during their visit and any new prescriptions provided to the patient are listed below.  Medications given during this visit Medications   HYDROcodone-acetaminophen (NORCO/VICODIN) 5-325 MG per tablet 1 tablet (1 tablet Oral Given 10/25/14 1700)    New Prescriptions   HYDROCODONE-ACETAMINOPHEN (NORCO) 5-325 MG PER TABLET    Take 1 tablet by mouth every 6 (six) hours as needed for moderate pain.     Purvis SheffieldForrest Yanelle Sousa, MD 10/26/14 [phone]

## 2014-11-19 ENCOUNTER — Encounter (HOSPITAL_COMMUNITY): Payer: Self-pay | Admitting: Emergency Medicine

## 2014-11-19 ENCOUNTER — Emergency Department (HOSPITAL_COMMUNITY)
Admission: EM | Admit: 2014-11-19 | Discharge: 2014-11-19 | Disposition: A | Discharge status: Home / Self Care | Payer: Medicare HMO | Attending: Emergency Medicine | Admitting: Emergency Medicine

## 2014-11-19 DIAGNOSIS — S32000A Wedge compression fracture of unspecified lumbar vertebra, initial encounter for closed fracture: Secondary | ICD-10-CM

## 2014-11-19 DIAGNOSIS — Z79899 Other long term (current) drug therapy: Secondary | ICD-10-CM | POA: Diagnosis not present

## 2014-11-19 DIAGNOSIS — Z862 Personal history of diseases of the blood and blood-forming organs and certain disorders involving the immune mechanism: Secondary | ICD-10-CM | POA: Insufficient documentation

## 2014-11-19 DIAGNOSIS — F329 Major depressive disorder, single episode, unspecified: Secondary | ICD-10-CM | POA: Diagnosis not present

## 2014-11-19 DIAGNOSIS — Z7902 Long term (current) use of antithrombotics/antiplatelets: Secondary | ICD-10-CM | POA: Diagnosis not present

## 2014-11-19 DIAGNOSIS — Z87891 Personal history of nicotine dependence: Secondary | ICD-10-CM | POA: Diagnosis not present

## 2014-11-19 DIAGNOSIS — Y998 Other external cause status: Secondary | ICD-10-CM | POA: Insufficient documentation

## 2014-11-19 DIAGNOSIS — N183 Chronic kidney disease, stage 3 (moderate): Secondary | ICD-10-CM | POA: Diagnosis not present

## 2014-11-19 DIAGNOSIS — W1839XA Other fall on same level, initial encounter: Secondary | ICD-10-CM | POA: Diagnosis not present

## 2014-11-19 DIAGNOSIS — G40909 Epilepsy, unspecified, not intractable, without status epilepticus: Secondary | ICD-10-CM | POA: Insufficient documentation

## 2014-11-19 DIAGNOSIS — I129 Hypertensive chronic kidney disease with stage 1 through stage 4 chronic kidney disease, or unspecified chronic kidney disease: Secondary | ICD-10-CM | POA: Insufficient documentation

## 2014-11-19 DIAGNOSIS — S32019A Unspecified fracture of first lumbar vertebra, initial encounter for closed fracture: Secondary | ICD-10-CM | POA: Diagnosis not present

## 2014-11-19 DIAGNOSIS — Z8719 Personal history of other diseases of the digestive system: Secondary | ICD-10-CM | POA: Diagnosis not present

## 2014-11-19 DIAGNOSIS — I5032 Chronic diastolic (congestive) heart failure: Secondary | ICD-10-CM | POA: Insufficient documentation

## 2014-11-19 DIAGNOSIS — E785 Hyperlipidemia, unspecified: Secondary | ICD-10-CM | POA: Insufficient documentation

## 2014-11-19 DIAGNOSIS — I2510 Atherosclerotic heart disease of native coronary artery without angina pectoris: Secondary | ICD-10-CM | POA: Insufficient documentation

## 2014-11-19 DIAGNOSIS — J449 Chronic obstructive pulmonary disease, unspecified: Secondary | ICD-10-CM | POA: Diagnosis not present

## 2014-11-19 DIAGNOSIS — Y9289 Other specified places as the place of occurrence of the external cause: Secondary | ICD-10-CM | POA: Diagnosis not present

## 2014-11-19 DIAGNOSIS — Y9389 Activity, other specified: Secondary | ICD-10-CM | POA: Diagnosis not present

## 2014-11-19 DIAGNOSIS — Z9889 Other specified postprocedural states: Secondary | ICD-10-CM | POA: Diagnosis not present

## 2014-11-19 DIAGNOSIS — S3992XA Unspecified injury of lower back, initial encounter: Secondary | ICD-10-CM | POA: Diagnosis present

## 2014-11-19 DIAGNOSIS — F419 Anxiety disorder, unspecified: Secondary | ICD-10-CM | POA: Insufficient documentation

## 2014-11-19 MED ORDER — HYDROCODONE-ACETAMINOPHEN 5-325 MG PO TABS
1.0000 | ORAL_TABLET | Freq: Once | ORAL | Status: AC
  Administered 2014-11-19: 1 via ORAL
  Filled 2014-11-19: qty 1

## 2014-11-19 MED ORDER — HYDROCODONE-ACETAMINOPHEN 5-325 MG PO TABS
1.0000 | ORAL_TABLET | Freq: Once | ORAL | Status: AC
Start: 2014-11-19 — End: 2014-11-19
  Administered 2014-11-19: 1 via ORAL
  Filled 2014-11-19: qty 1

## 2014-11-19 MED ORDER — HYDROCODONE-ACETAMINOPHEN 5-325 MG PO TABS: 1.0000 | ORAL_TABLET | Freq: Four times a day (QID) | ORAL | Status: DC | PRN

## 2014-11-19 NOTE — ED Provider Notes (Addendum)
CSN: [id number]     Arrival date & time 11/19/14  1518 History   First MD Initiated Contact with Patient 11/19/14 1557     Chief Complaint  Patient presents with  . Back Pain     (Consider location/radiation/quality/duration/timing/severity/associated sxs/prior Treatment) Patient is a 81 y.o. female presenting with back pain. The history is provided by the patient.  Back Pain Location:  Lumbar spine Quality:  Aching and stiffness Stiffness is present:  All day Radiates to:  Does not radiate Pain severity:  Moderate Pain is:  Same all the time Onset quality:  Sudden Duration:  4 days Timing:  Constant Progression:  Worsening Chronicity:  New Context comment:  Pt fell 4 days ago when she tried to close the window and fell over her bedside commode Relieved by:  Narcotics Worsened by:  Bending and movement Associated symptoms: no abdominal pain, no abdominal swelling, no bladder incontinence, no bowel incontinence, no dysuria, no fever, no leg pain, no numbness, no paresthesias and no weakness   Risk factors: hx of osteoporosis and menopause     Past Medical History  Diagnosis Date  . Fatigue   . Anxiety   . Dizziness   . CAD (coronary artery disease)     a. Cath 2004: 70% ostial LAD, 50-60% prox ramus disease, treated medically. b. Nuc 12/2009 - EF 74%, normal study.  . Hypertension   . Hyperlipidemia   . Coronary artery disease   . Anemia     a. Chronic iron deficiency anemia  . Syncope and collapse     RECURRENT, RELATED TO HYPONATREMIA, NARCOTICS, AND BRADYCARDIA  . Tobacco abuse     PAST  . Memory disorder   . Seizure disorder   . Depression   . Chronic diastolic CHF (congestive heart failure)     a. Echo 02/2014: mild focal basal hypertrophy of septum, Normal LV function EF 55-60%; grade 1 diastolic dysfunction; mildly elevated PASP.  Marland Kitchen GERD (gastroesophageal reflux disease)   . COPD (chronic obstructive pulmonary disease)   . CKD (chronic kidney disease), stage  III    Past Surgical History  Procedure Laterality Date  . Cardiac catheterization  2004  . Abdominal hysterectomy      early 1970's for fibroid   Family History  Problem Relation Age of Onset  . Heart attack Father   . Heart failure Mother   . CAD Father   . Congestive Heart Failure Mother   . Other Brother     cerebral hemmorhage  . Coronary artery disease Sister    History  Substance Use Topics  . Smoking status: Former Smoker -- 1.00 packs/day for 40 years    Quit date: 08/23/1999  . Smokeless tobacco: Never Used  . Alcohol Use: No   OB History    No data available     Review of Systems  Constitutional: Negative for fever.  Gastrointestinal: Negative for abdominal pain and bowel incontinence.  Genitourinary: Negative for bladder incontinence and dysuria.  Musculoskeletal: Positive for back pain.  Neurological: Negative for weakness, numbness and paresthesias.  All other systems reviewed and are negative.     Allergies  Codeine  Home Medications   Prior to Admission medications   Medication Sig Start Date End Date Taking? Authorizing Provider  acetaminophen (TYLENOL) 500 MG tablet Take 1,000 mg by mouth every 6 (six) hours as needed. For pain.   Yes Historical Provider, MD  Calcium Carbonate-Vitamin D (CALCIUM 600 + D PO) Take 1 tablet by mouth  daily.    Yes Historical Provider, MD  clopidogrel (PLAVIX) 75 MG tablet Take 75 mg by mouth daily.   Yes Historical Provider, MD  diphenhydrAMINE (BENADRYL) 25 MG tablet Take 25 mg by mouth every 6 (six) hours as needed for allergies.   Yes Historical Provider, MD  escitalopram (LEXAPRO) 10 MG tablet Take 10 mg by mouth daily.   Yes Historical Provider, MD  furosemide (LASIX) 20 MG tablet Take 20 mg by mouth 2 (two) times daily.   Yes Historical Provider, MD  HYDROcodone-acetaminophen (NORCO) 5-325 MG per tablet Take 1 tablet by mouth every 6 (six) hours as needed for moderate pain. 10/25/14  Yes Purvis SheffieldForrest Harrison, MD    isosorbide mononitrate (IMDUR) 30 MG 24 hr tablet Take 30 mg by mouth daily.   Yes Historical Provider, MD  levETIRAcetam (KEPPRA) 500 MG tablet Take 750 mg by mouth every 12 (twelve) hours.  06/28/12  Yes Rebecca S Tat, DO  loperamide (IMODIUM) 2 MG capsule Take 1 capsule (2 mg total) by mouth 4 (four) times daily as needed for diarrhea or loose stools. 08/21/14  Yes [name], DO  LORazepam (ATIVAN) 0.5 MG tablet Take 0.5 tablets (0.25 mg total) by mouth 2 (two) times daily as needed for anxiety. 10/04/14  Yes Shari Upstill, PA-C  nitroGLYCERIN (NITROSTAT) 0.4 MG SL tablet Place 0.4 mg under the tongue every 5 (five) minutes as needed for chest pain.   Yes Historical Provider, MD  ondansetron (ZOFRAN ODT) 4 MG disintegrating tablet Take 1 tablet (4 mg total) by mouth every 8 (eight) hours as needed for nausea or vomiting. 08/21/14  Yes [name], DO  traMADol (ULTRAM) 50 MG tablet Take 50 mg by mouth every 6 (six) hours as needed for moderate pain.   Yes Historical Provider, MD  albuterol (PROVENTIL) (2.5 MG/3ML) 0.083% nebulizer solution Take 3 mLs (2.5 mg total) by nebulization every 6 (six) hours as needed for wheezing. Patient not taking: Reported on 08/21/2014 04/09/14   Tasrif Ahmed, MD   BP 134/61 mmHg  Pulse 72  Temp(Src) 98.3 F (36.8 C) (Oral)  Resp 22  SpO2 98% Physical Exam  Constitutional: She is oriented to person, place, and time. She appears well-developed and well-nourished. No distress.  HENT:  Head: Normocephalic and atraumatic.  Mouth/Throat: Oropharynx is clear and moist.  Eyes: Conjunctivae and EOM are normal. Pupils are equal, round, and reactive to light.  Neck: Normal range of motion. Neck supple.  Cardiovascular: Normal rate, regular rhythm and intact distal pulses.   No murmur heard. Pulmonary/Chest: Effort normal and breath sounds normal. No respiratory distress. She has no wheezes. She has no rales.  Abdominal: Soft. She exhibits no distension. There  is no tenderness. There is no rebound and no guarding.  Musculoskeletal: Normal range of motion. She exhibits no edema.       Lumbar back: She exhibits tenderness, bony tenderness and pain. She exhibits no deformity, no spasm and normal pulse.       Back:  Neurological: She is alert and oriented to person, place, and time. She has normal strength. No sensory deficit.  Able to range legs completely without difficulty or weakness  Skin: Skin is warm and dry. No rash noted. No erythema.  Psychiatric: She has a normal mood and affect. Her behavior is normal.  Nursing note and vitals reviewed.   ED Course  Procedures (including critical care time) Labs Review Labs Reviewed - No data to display  Imaging Review No results  found.   EKG Interpretation None      MDM   Final diagnoses:  Lumbar compression fracture, closed, initial encounter    Patient presenting complaining of back pain. She had a mechanical fall 4 days ago when she was trying to close her window. Since that time she has had back pain. She states the pain is severe. She went to [facility] and had imaging done on 11/15/2014 and was given pain medicine. Since she is rather pain medicine and states her pain is severe. She denies any urinary or bladder complaints. She is able to ambulate with her cane. She has no weakness in her lower extremities.  Looking at the imaging done at [facility] patient had an L1 compression fracture that was mild. This most likely is new as it is the location of her pain today. Will treat her pain and discharge home  6:33 PM Patient's pain is significantly improved after 2 Vicodin. Also social work consult id. patient has been set up to have home health PT OT and RN and aide.  Patient states she feels much better and she is ready to go home.  Gwyneth Sprout, MD 11/19/14 1610  Gwyneth Sprout, MD 11/19/14 [phone]

## 2014-11-19 NOTE — Progress Notes (Signed)
11/19/2014 A. Cyrene Gharibian RNCM 1846pm EDCM spoke to patient at bedside to confirm her address and phone number.  Patient confiirmed her son [name] [name]'s phone number to be [phone].  Patient unsure of her address.  EDCM called patient's son to confirm address, left voice message and phone number for call back.  [facility] patient's sister's phone number Ms. [name] [name] [phone].  EDCM called this number without success.

## 2014-11-19 NOTE — Progress Notes (Signed)
  CARE MANAGEMENT ED NOTE 11/19/2014  Patient:  [name] [name],[name] [name]   Account Number:  [id number]  Date Initiated:  11/19/2014  Documentation initiated by:  Radford PaxFERRERO,Makennah Omura  Subjective/Objective Assessment:   Patient presents to Ed with back pain.     Subjective/Objective Assessment Detail:   Patient reports she fell over her bedside commode at home trying to shut her window approximately four days ago.     Action/Plan:   Action/Plan Detail:   Anticipated DC Date:  11/19/2014     Status Recommendation to Physician:   Result of Recommendation:    Other ED Services  Consult Working Plan    DC Planning Services  Other  Outpatient Services - Pt will follow up   [facility]  [facility]   Choice offered to / List presented to:  C-1 Patient     HH arranged  HH-1 RN  HH-2 PT  HH-3 OT  HH-4 NURSE'S AIDE  HH-6 SOCIAL WORKER      HH agency  [facility]    Status of service:  Completed, signed off  ED Comments:   ED Comments Detail:  EDCM spoke to patient at bedside with EDSW.  Patient lives at home alone.  Patient reports her son visits her, "Quite often" and sometimes stays overnight with her.  Patient has a walker, cane, bedside commode and shower chair at home. Patient reports she prepares her own meals.  Patient reports she can perform her own ADL'swithout difficulty. Patient confrims her pcp is Dr. Cyndia BentBadger.  Patient confirms her son takes her to her doctor appointments.  Patient is interested  in home health services and reports [facility] has seen her in the past.  [facility]  EDCM discussed patient with EDP who placed orders for home health RN, PT, OT iade and Child psychotherapistsocial worker.  [facility] strongly encouraged patient to make a follow up appointment with her pcp for follow up. [facility] provided patient with list of [facility] health agencies [facility].  EDCM explained to patient [facility] has 24-48 hours to contact her.  Patient thankful  for services.  No further EDCM needs at this time.  [facility] 1801[facility] with confirmation of receipt at 1804pm.

## 2014-11-19 NOTE — ED Notes (Signed)
Per EMS pt with Hx of chronic pain c/o pain in ruptured disk onset Friday after trip and fall, pt c/o pain to right lower back. Per EMS, no deformities to spine, pt was treated for the same on Friday after the fall and states she has had no relief. Per EMS pt has Rx for tramadol and hydrocodone, pt is out of hydrocodone Rx. Pt ambulatory on scene.

## 2014-11-19 NOTE — Progress Notes (Signed)
Patient states her son's number is [phone] / Jena Gaussay [place] .   Crista CurbBrittney Masha Orbach, [place] [phone] ED CSW

## 2014-11-19 NOTE — ED Notes (Signed)
Bed: [facility] Expected date:  Expected time:  Means of arrival:  Comments: EMS-back pain 

## 2014-11-19 NOTE — Progress Notes (Signed)
CSW met with pt at bedside along with Nurse CM. There was no family present. Patient presents to [facility] due to back pain.  Patient states that she lives at home alone in her apartment in [place]. Patient states that her son Jeanell Sparrow is her primary support and often comes to visit her. Also, she states that her son takes her to her doctor appointments. Patient states that her PCP is Dr.Badger.  Patient informed CSW that she does not fall often. Patient informed CSW that she walks with a cane. Patient says that she can complete her ADL's independently. Also, patient states that she makes her own meals.  Patient states that she is interested in home health services and states she has had [place] [facility] in the past. Per note, [facility] faxed home health referral to [place] at 1801pm with confirmation of receipt at 1804pm.  Patient informed CSW that she was wanted a cup of water. CSW consulted with nurse who states that it is okay for pt to have a drink of water. Water was brought to the pt.  Patient does not have any questions  Willette Brace [phone] ED CSW 11/19/2014 9:34 PM

## 2014-12-04 ENCOUNTER — Inpatient Hospital Stay (HOSPITAL_COMMUNITY)
Admission: EM | Admit: 2014-12-04 | Discharge: 2014-12-09 | DRG: 470 | Disposition: A | Discharge status: Skilled Nursing Facility | Payer: Medicare HMO | Attending: Family Medicine | Admitting: Family Medicine

## 2014-12-04 ENCOUNTER — Encounter (HOSPITAL_COMMUNITY): Payer: Self-pay

## 2014-12-04 DIAGNOSIS — N183 Chronic kidney disease, stage 3 unspecified: Secondary | ICD-10-CM | POA: Diagnosis present

## 2014-12-04 DIAGNOSIS — Z885 Allergy status to narcotic agent status: Secondary | ICD-10-CM

## 2014-12-04 DIAGNOSIS — I5032 Chronic diastolic (congestive) heart failure: Secondary | ICD-10-CM | POA: Diagnosis present

## 2014-12-04 DIAGNOSIS — I129 Hypertensive chronic kidney disease with stage 1 through stage 4 chronic kidney disease, or unspecified chronic kidney disease: Secondary | ICD-10-CM | POA: Diagnosis present

## 2014-12-04 DIAGNOSIS — K219 Gastro-esophageal reflux disease without esophagitis: Secondary | ICD-10-CM | POA: Diagnosis present

## 2014-12-04 DIAGNOSIS — T1490XA Injury, unspecified, initial encounter: Secondary | ICD-10-CM

## 2014-12-04 DIAGNOSIS — D509 Iron deficiency anemia, unspecified: Secondary | ICD-10-CM | POA: Diagnosis present

## 2014-12-04 DIAGNOSIS — F329 Major depressive disorder, single episode, unspecified: Secondary | ICD-10-CM | POA: Diagnosis present

## 2014-12-04 DIAGNOSIS — M25551 Pain in right hip: Secondary | ICD-10-CM | POA: Diagnosis not present

## 2014-12-04 DIAGNOSIS — K449 Diaphragmatic hernia without obstruction or gangrene: Secondary | ICD-10-CM | POA: Diagnosis present

## 2014-12-04 DIAGNOSIS — Z90710 Acquired absence of both cervix and uterus: Secondary | ICD-10-CM

## 2014-12-04 DIAGNOSIS — Z96641 Presence of right artificial hip joint: Secondary | ICD-10-CM

## 2014-12-04 DIAGNOSIS — D62 Acute posthemorrhagic anemia: Secondary | ICD-10-CM | POA: Diagnosis not present

## 2014-12-04 DIAGNOSIS — W1830XA Fall on same level, unspecified, initial encounter: Secondary | ICD-10-CM | POA: Diagnosis present

## 2014-12-04 DIAGNOSIS — G40909 Epilepsy, unspecified, not intractable, without status epilepticus: Secondary | ICD-10-CM

## 2014-12-04 DIAGNOSIS — Y92091 Bathroom in other non-institutional residence as the place of occurrence of the external cause: Secondary | ICD-10-CM

## 2014-12-04 DIAGNOSIS — S72001A Fracture of unspecified part of neck of right femur, initial encounter for closed fracture: Principal | ICD-10-CM | POA: Diagnosis present

## 2014-12-04 DIAGNOSIS — J449 Chronic obstructive pulmonary disease, unspecified: Secondary | ICD-10-CM | POA: Diagnosis present

## 2014-12-04 DIAGNOSIS — M8580 Other specified disorders of bone density and structure, unspecified site: Secondary | ICD-10-CM | POA: Diagnosis present

## 2014-12-04 DIAGNOSIS — E785 Hyperlipidemia, unspecified: Secondary | ICD-10-CM | POA: Diagnosis present

## 2014-12-04 DIAGNOSIS — Y9301 Activity, walking, marching and hiking: Secondary | ICD-10-CM

## 2014-12-04 DIAGNOSIS — I2510 Atherosclerotic heart disease of native coronary artery without angina pectoris: Secondary | ICD-10-CM | POA: Diagnosis present

## 2014-12-04 DIAGNOSIS — I10 Essential (primary) hypertension: Secondary | ICD-10-CM | POA: Diagnosis present

## 2014-12-04 DIAGNOSIS — Z87891 Personal history of nicotine dependence: Secondary | ICD-10-CM

## 2014-12-04 DIAGNOSIS — Z6822 Body mass index (BMI) 22.0-22.9, adult: Secondary | ICD-10-CM

## 2014-12-04 DIAGNOSIS — F419 Anxiety disorder, unspecified: Secondary | ICD-10-CM | POA: Diagnosis present

## 2014-12-04 DIAGNOSIS — E440 Moderate protein-calorie malnutrition: Secondary | ICD-10-CM | POA: Diagnosis present

## 2014-12-04 MED ORDER — ONDANSETRON HCL 4 MG/2ML IJ SOLN
4.0000 mg | Freq: Once | INTRAMUSCULAR | Status: AC
  Administered 2014-12-05: 4 mg via INTRAVENOUS
  Filled 2014-12-04: qty 2

## 2014-12-04 MED ORDER — SODIUM CHLORIDE 0.9 % IV SOLN
INTRAVENOUS | Status: DC
  Administered 2014-12-05: 01:00:00 via INTRAVENOUS

## 2014-12-04 MED ORDER — MORPHINE SULFATE 2 MG/ML IJ SOLN
2.0000 mg | Freq: Once | INTRAMUSCULAR | Status: AC
  Administered 2014-12-05: 2 mg via INTRAVENOUS
  Filled 2014-12-04: qty 1

## 2014-12-04 NOTE — ED Notes (Signed)
Per EMS, Pt has right hip dislocation, deformity noted to right hip. Pt slipped and fell at home on her socks. No LOC, pt denies neck or back pain. Pt AxO x 4. Pt in no acute distress. Pt received of fentanyl. Distal pulses palpated.

## 2014-12-04 NOTE — ED Provider Notes (Signed)
CSN: [id number]     Arrival date & time 12/04/14  2327 History  This chart was scribed for [name] Barrette, MD by Bronson Curb, ED Scribe. This patient was seen in room B19C/B19C and the patient's care was started at 11:52 PM.   Chief Complaint  Patient presents with  . Hip Injury    The history is provided by the patient. No language interpreter was used.     HPI Comments: [name] [name] is a 81 y.o. female, with history of CAD, CHF, CKD, brought in by ambulance, who presents to the Emergency Department complaining of a right hip injury sustained after a fall that occurred PTA. Patient is from home and reports she fell in the bathroom and was unable to get back up. She notes constant, severe, right hip pain and suspects it may be fractured. Patient received 150 mcg of Fentanyl by EMS and is requesting pain medication at this time. She denies head injury or LOC. She denies SOB, chest pain, back pain, neck pain, or any other injuries.    Past Medical History  Diagnosis Date  . Fatigue   . Anxiety   . Dizziness   . CAD (coronary artery disease)     a. Cath 2004: 70% ostial LAD, 50-60% prox ramus disease, treated medically. b. Nuc 12/2009 - EF 74%, normal study.  . Hypertension   . Hyperlipidemia   . Coronary artery disease   . Anemia     a. Chronic iron deficiency anemia  . Syncope and collapse     RECURRENT, RELATED TO HYPONATREMIA, NARCOTICS, AND BRADYCARDIA  . Tobacco abuse     PAST  . Memory disorder   . Seizure disorder   . Depression   . Chronic diastolic CHF (congestive heart failure)     a. Echo 02/2014: mild focal basal hypertrophy of septum, Normal LV function EF 55-60%; grade 1 diastolic dysfunction; mildly elevated PASP.  Marland Kitchen GERD (gastroesophageal reflux disease)   . COPD (chronic obstructive pulmonary disease)   . CKD (chronic kidney disease), stage III    Past Surgical History  Procedure Laterality Date  . Cardiac catheterization  2004  . Abdominal hysterectomy       early 1970's for fibroid   Family History  Problem Relation Age of Onset  . Heart attack Father   . Heart failure Mother   . CAD Father   . Congestive Heart Failure Mother   . Other Brother     cerebral hemmorhage  . Coronary artery disease Sister    History  Substance Use Topics  . Smoking status: Former Smoker -- 1.00 packs/day for 40 years    Quit date: 08/23/1999  . Smokeless tobacco: Never Used  . Alcohol Use: No   OB History    No data available     Review of Systems  A complete 10 system review of systems was obtained and all systems are negative except as noted in the HPI and PMH.    Allergies  Codeine  Home Medications   Prior to Admission medications   Medication Sig Start Date End Date Taking? Authorizing Provider  acetaminophen (TYLENOL) 500 MG tablet Take 1,000 mg by mouth every 6 (six) hours as needed. For pain.    Historical Provider, MD  albuterol (PROVENTIL) (2.5 MG/3ML) 0.083% nebulizer solution Take 3 mLs (2.5 mg total) by nebulization every 6 (six) hours as needed for wheezing. Patient not taking: Reported on 08/21/2014 04/09/14   [name], MD  Calcium Carbonate-Vitamin D (  CALCIUM 600 + D PO) Take 1 tablet by mouth daily.     Historical Provider, MD  clopidogrel (PLAVIX) 75 MG tablet Take 75 mg by mouth daily.    Historical Provider, MD  diphenhydrAMINE (BENADRYL) 25 MG tablet Take 25 mg by mouth every 6 (six) hours as needed for allergies.    Historical Provider, MD  escitalopram (LEXAPRO) 10 MG tablet Take 10 mg by mouth daily.    Historical Provider, MD  furosemide (LASIX) 20 MG tablet Take 20 mg by mouth 2 (two) times daily.    Historical Provider, MD  HYDROcodone-acetaminophen (NORCO/VICODIN) 5-325 MG per tablet Take 1-2 tablets by mouth every 6 (six) hours as needed. 11/19/14   [name], MD  isosorbide mononitrate (IMDUR) 30 MG 24 hr tablet Take 30 mg by mouth daily.    Historical Provider, MD  levETIRAcetam (KEPPRA) 500 MG tablet  Take 750 mg by mouth every 12 (twelve) hours.  06/28/12   [name], DO  loperamide (IMODIUM) 2 MG capsule Take 1 capsule (2 mg total) by mouth 4 (four) times daily as needed for diarrhea or loose stools. 08/21/14   [name], DO  LORazepam (ATIVAN) 0.5 MG tablet Take 0.5 tablets (0.25 mg total) by mouth 2 (two) times daily as needed for anxiety. 10/04/14   [name], PA-C  nitroGLYCERIN (NITROSTAT) 0.4 MG SL tablet Place 0.4 mg under the tongue every 5 (five) minutes as needed for chest pain.    Historical Provider, MD  ondansetron (ZOFRAN ODT) 4 MG disintegrating tablet Take 1 tablet (4 mg total) by mouth every 8 (eight) hours as needed for nausea or vomiting. 08/21/14   [name], DO  traMADol (ULTRAM) 50 MG tablet Take 50 mg by mouth every 6 (six) hours as needed for moderate pain.    Historical Provider, MD   Triage Vitals: Pulse 69  Temp(Src) 97.7 F (36.5 C) (Oral)  Resp 16  Ht 5\' 2"  (1.575 m)  Wt 121 lb (54.885 kg)  BMI 22.13 kg/m2  SpO2 94%  Physical Exam  Constitutional: She is oriented to person, place, and time. She appears well-developed and well-nourished. She appears distressed.  The patient appears to be in severe pain. She is alert and appropriate. She has no respiratory distress.  HENT:  Head: Normocephalic and atraumatic.  Right Ear: External ear normal.  Left Ear: External ear normal.  Nose: Nose normal.  Mouth/Throat: Oropharynx is clear and moist.  Eyes: Conjunctivae and EOM are normal. Pupils are equal, round, and reactive to light.  Neck: Neck supple. No tracheal deviation present.  Cardiovascular: Normal rate, regular rhythm, normal heart sounds and intact distal pulses.   Pulmonary/Chest: Effort normal and breath sounds normal. No respiratory distress. She has no wheezes. She has no rales. She exhibits no tenderness.  Abdominal: Soft. Bowel sounds are normal. She exhibits no distension. There is no tenderness. There is no guarding.   Musculoskeletal: She exhibits no edema.  Patient endorses severe pain in her right hip. She cannot perform any range of motion. The dorsalis pedis pulses are 2+ and symmetric. There is no peripheral edema. Bilateral upper extremities have normal range of motion and use.  Neurological: She is alert and oriented to person, place, and time. She has normal strength. No cranial nerve deficit. She exhibits normal muscle tone. Coordination normal. GCS eye subscore is 4. GCS verbal subscore is 5. GCS motor subscore is 6.  Skin: Skin is warm, dry and intact.  Psychiatric: She has  a normal mood and affect. Her behavior is normal.  Nursing note and vitals reviewed.   ED Course  Procedures (including critical care time)  DIAGNOSTIC STUDIES: Oxygen Saturation is 94% on room air, adequate by my interpretation.    COORDINATION OF CARE: At 2357 Discussed treatment plan with patient which includes morphine and imaging. Patient agrees.   At 0108 Upon re-evaluation, patient states her pain has not improved since receiving morphine.  Labs Review Labs Reviewed  BASIC METABOLIC PANEL  CBC WITH DIFFERENTIAL/PLATELET  PROTIME-INR  TYPE AND SCREEN    Imaging Review Dg Hip Unilat  With Pelvis 2-3 Views Right  12/05/2014   CLINICAL DATA:  Right hip injury coming out of the bathroom  EXAM: RIGHT HIP (WITH PELVIS) 2-3 VIEWS  COMPARISON:  None.  FINDINGS: Right femoral neck fracture, transcervical or subcapital. The fracture is displaced with femoral head rotation and varus angulation. The femoral head remains located.  No evidence of pelvic ring fracture or diastasis. The left hip is negative.  Osteopenia.  IMPRESSION: Displaced right femoral neck fracture.   Electronically Signed   By: Marnee Spring M.D.   On: 12/05/2014 00:51     EKG Interpretation None     Consult: Case discussed with Dr. Luiz Blare. He advises admit to medicine for plan surgical fixation tomorrow. MDM   Final diagnoses:  Closed right  hip fracture, initial encounter   Patient has fallen in her home and sustained a right hip fracture. At this point there do not appear to be any associated injuries. Patient's mental status and respiratory status are stable. She has comorbid medical illness over these conditions appear to be stable at this point. Vital signs are stable.    [name] Barrette, MD 12/05/14 [phone]

## 2014-12-05 ENCOUNTER — Encounter (HOSPITAL_COMMUNITY): Payer: Self-pay | Admitting: Internal Medicine

## 2014-12-05 ENCOUNTER — Inpatient Hospital Stay (HOSPITAL_COMMUNITY): Payer: Medicare HMO

## 2014-12-05 ENCOUNTER — Inpatient Hospital Stay (HOSPITAL_COMMUNITY): Payer: Medicare HMO | Admitting: Anesthesiology

## 2014-12-05 ENCOUNTER — Other Ambulatory Visit (HOSPITAL_COMMUNITY): Payer: Medicare HMO

## 2014-12-05 ENCOUNTER — Emergency Department (HOSPITAL_COMMUNITY): Payer: Medicare HMO

## 2014-12-05 ENCOUNTER — Encounter (HOSPITAL_COMMUNITY)
Admission: EM | Disposition: A | Discharge status: Skilled Nursing Facility | Payer: Self-pay | Source: Home / Self Care | Attending: Family Medicine

## 2014-12-05 DIAGNOSIS — E440 Moderate protein-calorie malnutrition: Secondary | ICD-10-CM | POA: Diagnosis present

## 2014-12-05 DIAGNOSIS — Z6822 Body mass index (BMI) 22.0-22.9, adult: Secondary | ICD-10-CM | POA: Diagnosis not present

## 2014-12-05 DIAGNOSIS — M25551 Pain in right hip: Secondary | ICD-10-CM | POA: Diagnosis present

## 2014-12-05 DIAGNOSIS — I2510 Atherosclerotic heart disease of native coronary artery without angina pectoris: Secondary | ICD-10-CM | POA: Diagnosis present

## 2014-12-05 DIAGNOSIS — N183 Chronic kidney disease, stage 3 unspecified: Secondary | ICD-10-CM | POA: Diagnosis present

## 2014-12-05 DIAGNOSIS — E785 Hyperlipidemia, unspecified: Secondary | ICD-10-CM | POA: Diagnosis present

## 2014-12-05 DIAGNOSIS — D62 Acute posthemorrhagic anemia: Secondary | ICD-10-CM | POA: Diagnosis not present

## 2014-12-05 DIAGNOSIS — Y92091 Bathroom in other non-institutional residence as the place of occurrence of the external cause: Secondary | ICD-10-CM | POA: Diagnosis not present

## 2014-12-05 DIAGNOSIS — S72001S Fracture of unspecified part of neck of right femur, sequela: Secondary | ICD-10-CM | POA: Diagnosis not present

## 2014-12-05 DIAGNOSIS — R569 Unspecified convulsions: Secondary | ICD-10-CM | POA: Diagnosis not present

## 2014-12-05 DIAGNOSIS — S72001A Fracture of unspecified part of neck of right femur, initial encounter for closed fracture: Secondary | ICD-10-CM | POA: Diagnosis present

## 2014-12-05 DIAGNOSIS — Z885 Allergy status to narcotic agent status: Secondary | ICD-10-CM | POA: Diagnosis not present

## 2014-12-05 DIAGNOSIS — G40909 Epilepsy, unspecified, not intractable, without status epilepticus: Secondary | ICD-10-CM | POA: Diagnosis present

## 2014-12-05 DIAGNOSIS — D509 Iron deficiency anemia, unspecified: Secondary | ICD-10-CM | POA: Diagnosis present

## 2014-12-05 DIAGNOSIS — I5032 Chronic diastolic (congestive) heart failure: Secondary | ICD-10-CM | POA: Diagnosis present

## 2014-12-05 DIAGNOSIS — I129 Hypertensive chronic kidney disease with stage 1 through stage 4 chronic kidney disease, or unspecified chronic kidney disease: Secondary | ICD-10-CM | POA: Diagnosis present

## 2014-12-05 DIAGNOSIS — F329 Major depressive disorder, single episode, unspecified: Secondary | ICD-10-CM | POA: Diagnosis present

## 2014-12-05 DIAGNOSIS — Z87891 Personal history of nicotine dependence: Secondary | ICD-10-CM | POA: Diagnosis not present

## 2014-12-05 DIAGNOSIS — Y9301 Activity, walking, marching and hiking: Secondary | ICD-10-CM | POA: Diagnosis not present

## 2014-12-05 DIAGNOSIS — F419 Anxiety disorder, unspecified: Secondary | ICD-10-CM | POA: Diagnosis present

## 2014-12-05 DIAGNOSIS — K449 Diaphragmatic hernia without obstruction or gangrene: Secondary | ICD-10-CM | POA: Diagnosis present

## 2014-12-05 DIAGNOSIS — I10 Essential (primary) hypertension: Secondary | ICD-10-CM | POA: Diagnosis not present

## 2014-12-05 DIAGNOSIS — M8580 Other specified disorders of bone density and structure, unspecified site: Secondary | ICD-10-CM | POA: Diagnosis present

## 2014-12-05 DIAGNOSIS — K219 Gastro-esophageal reflux disease without esophagitis: Secondary | ICD-10-CM | POA: Diagnosis present

## 2014-12-05 DIAGNOSIS — W1830XA Fall on same level, unspecified, initial encounter: Secondary | ICD-10-CM | POA: Diagnosis present

## 2014-12-05 DIAGNOSIS — Z90710 Acquired absence of both cervix and uterus: Secondary | ICD-10-CM | POA: Diagnosis not present

## 2014-12-05 DIAGNOSIS — J449 Chronic obstructive pulmonary disease, unspecified: Secondary | ICD-10-CM | POA: Diagnosis present

## 2014-12-05 HISTORY — PX: HIP ARTHROPLASTY: SHX981

## 2014-12-05 LAB — BASIC METABOLIC PANEL
Anion gap: 14 (ref 5–15)
BUN: 18 mg/dL (ref 6–23)
CO2: 25 mmol/L (ref 19–32)
CREATININE: 1.36 mg/dL — AB (ref 0.50–1.10)
Calcium: 9 mg/dL (ref 8.4–10.5)
Chloride: 98 mmol/L (ref 96–112)
GFR calc Af Amer: 40 mL/min — ABNORMAL LOW (ref >90)
GFR calc non Af Amer: 34 mL/min — ABNORMAL LOW (ref >90)
GLUCOSE: 152 mg/dL — AB (ref 70–99)
POTASSIUM: 3.9 mmol/L (ref 3.5–5.1)
Sodium: 137 mmol/L (ref 135–145)

## 2014-12-05 LAB — URINALYSIS, ROUTINE W REFLEX MICROSCOPIC
Bilirubin Urine: NEGATIVE
Glucose, UA: NEGATIVE mg/dL
HGB URINE DIPSTICK: NEGATIVE
KETONES UR: NEGATIVE mg/dL
Leukocytes, UA: NEGATIVE
Nitrite: NEGATIVE
PH: 6.5 (ref 5.0–8.0)
PROTEIN: NEGATIVE mg/dL
Specific Gravity, Urine: 1.009 (ref 1.005–1.030)
Urobilinogen, UA: 0.2 mg/dL (ref 0.0–1.0)

## 2014-12-05 LAB — CBC WITH DIFFERENTIAL/PLATELET
Basophils Absolute: 0 10*3/uL (ref 0.0–0.1)
Basophils Absolute: 0 10*3/uL (ref 0.0–0.1)
Basophils Relative: 0 % (ref 0–1)
Basophils Relative: 0 % (ref 0–1)
EOS ABS: 0 10*3/uL (ref 0.0–0.7)
EOS PCT: 0 % (ref 0–5)
Eosinophils Absolute: 0 10*3/uL (ref 0.0–0.7)
Eosinophils Relative: 0 % (ref 0–5)
HCT: 32.8 % — ABNORMAL LOW (ref 36.0–46.0)
HCT: 30 % — ABNORMAL LOW (ref 36.0–46.0)
Hemoglobin: 11 g/dL — ABNORMAL LOW (ref 12.0–15.0)
Hemoglobin: 9.9 g/dL — ABNORMAL LOW (ref 12.0–15.0)
LYMPHS ABS: 1.5 10*3/uL (ref 0.7–4.0)
LYMPHS ABS: 1.9 10*3/uL (ref 0.7–4.0)
LYMPHS PCT: 13 % (ref 12–46)
LYMPHS PCT: 14 % (ref 12–46)
MCH: 31.4 pg (ref 26.0–34.0)
MCH: 31.6 pg (ref 26.0–34.0)
MCHC: 33.5 g/dL (ref 30.0–36.0)
MCHC: 33 g/dL (ref 30.0–36.0)
MCV: 93.7 fL (ref 78.0–100.0)
MCV: 95.8 fL (ref 78.0–100.0)
MONO ABS: 0.8 10*3/uL (ref 0.1–1.0)
Monocytes Absolute: 0.6 10*3/uL (ref 0.1–1.0)
Monocytes Relative: 5 % (ref 3–12)
Monocytes Relative: 7 % (ref 3–12)
NEUTROS PCT: 81 % — AB (ref 43–77)
Neutro Abs: 11.3 10*3/uL — ABNORMAL HIGH (ref 1.7–7.7)
Neutro Abs: 9 10*3/uL — ABNORMAL HIGH (ref 1.7–7.7)
Neutrophils Relative %: 80 % — ABNORMAL HIGH (ref 43–77)
PLATELETS: 311 10*3/uL (ref 150–400)
PLATELETS: 316 10*3/uL (ref 150–400)
RBC: 3.5 MIL/uL — ABNORMAL LOW (ref 3.87–5.11)
RBC: 3.13 MIL/uL — ABNORMAL LOW (ref 3.87–5.11)
RDW: 13.3 % (ref 11.5–15.5)
RDW: 13.4 % (ref 11.5–15.5)
WBC: 11.3 10*3/uL — AB (ref 4.0–10.5)
WBC: 13.9 10*3/uL — AB (ref 4.0–10.5)

## 2014-12-05 LAB — COMPREHENSIVE METABOLIC PANEL
ALK PHOS: 69 U/L (ref 39–117)
ALT: 12 U/L (ref 0–35)
AST: 22 U/L (ref 0–37)
Albumin: 3.5 g/dL (ref 3.5–5.2)
Anion gap: 10 (ref 5–15)
BILIRUBIN TOTAL: 0.6 mg/dL (ref 0.3–1.2)
BUN: 20 mg/dL (ref 6–23)
CO2: 28 mmol/L (ref 19–32)
Calcium: 8.5 mg/dL (ref 8.4–10.5)
Chloride: 99 mmol/L (ref 96–112)
Creatinine, Ser: 1.44 mg/dL — ABNORMAL HIGH (ref 0.50–1.10)
GFR calc Af Amer: 37 mL/min — ABNORMAL LOW (ref >90)
GFR calc non Af Amer: 32 mL/min — ABNORMAL LOW (ref >90)
GLUCOSE: 148 mg/dL — AB (ref 70–99)
POTASSIUM: 4.2 mmol/L (ref 3.5–5.1)
Sodium: 137 mmol/L (ref 135–145)
Total Protein: 6.1 g/dL (ref 6.0–8.3)

## 2014-12-05 LAB — ABO/RH: ABO/RH(D): O POS

## 2014-12-05 LAB — SURGICAL PCR SCREEN
MRSA, PCR: NEGATIVE
STAPHYLOCOCCUS AUREUS: NEGATIVE

## 2014-12-05 LAB — GLUCOSE, CAPILLARY
GLUCOSE-CAPILLARY: 143 mg/dL — AB (ref 70–99)
GLUCOSE-CAPILLARY: 102 mg/dL — AB (ref 70–99)
Glucose-Capillary: 97 mg/dL (ref 70–99)

## 2014-12-05 LAB — PROTIME-INR
INR: 1.01 (ref 0.00–1.49)
Prothrombin Time: 13.5 seconds (ref 11.6–15.2)

## 2014-12-05 LAB — PREPARE RBC (CROSSMATCH)

## 2014-12-05 SURGERY — HEMIARTHROPLASTY, HIP, DIRECT ANTERIOR APPROACH, FOR FRACTURE
Anesthesia: General | Site: Hip | Laterality: Right

## 2014-12-05 MED ORDER — PROPOFOL 10 MG/ML IV BOLUS
INTRAVENOUS | Status: DC | PRN
  Administered 2014-12-05: 70 mg via INTRAVENOUS

## 2014-12-05 MED ORDER — POLYETHYLENE GLYCOL 3350 17 G PO PACK
17.0000 g | PACK | Freq: Every day | ORAL | Status: DC | PRN
  Administered 2014-12-08: 17 g via ORAL
  Filled 2014-12-05: qty 1

## 2014-12-05 MED ORDER — MORPHINE SULFATE 4 MG/ML IJ SOLN
4.0000 mg | Freq: Once | INTRAMUSCULAR | Status: AC
  Administered 2014-12-05: 4 mg via INTRAVENOUS
  Filled 2014-12-05: qty 1

## 2014-12-05 MED ORDER — SODIUM CHLORIDE 0.9 % IR SOLN
Status: DC | PRN
  Administered 2014-12-05: 1000 mL

## 2014-12-05 MED ORDER — SODIUM CHLORIDE 0.9 % IV SOLN: INTRAVENOUS | Status: DC

## 2014-12-05 MED ORDER — SODIUM CHLORIDE 0.9 % IJ SOLN
INTRAMUSCULAR | Status: AC
  Filled 2014-12-05: qty 10

## 2014-12-05 MED ORDER — DOCUSATE SODIUM 100 MG PO CAPS
100.0000 mg | ORAL_CAPSULE | Freq: Two times a day (BID) | ORAL | Status: DC
  Administered 2014-12-05 – 2014-12-09 (×8): 100 mg via ORAL
  Filled 2014-12-05 (×8): qty 1

## 2014-12-05 MED ORDER — EPHEDRINE SULFATE 50 MG/ML IJ SOLN
INTRAMUSCULAR | Status: DC | PRN
  Administered 2014-12-05: 15 mg via INTRAVENOUS

## 2014-12-05 MED ORDER — ONDANSETRON HCL 4 MG/2ML IJ SOLN
INTRAMUSCULAR | Status: AC
  Filled 2014-12-05: qty 2

## 2014-12-05 MED ORDER — SODIUM CHLORIDE 0.9 % IV SOLN
INTRAVENOUS | Status: DC
  Administered 2014-12-05 – 2014-12-08 (×4): via INTRAVENOUS

## 2014-12-05 MED ORDER — LORAZEPAM 0.5 MG PO TABS
0.2500 mg | ORAL_TABLET | Freq: Two times a day (BID) | ORAL | Status: DC | PRN
  Administered 2014-12-05: 0.25 mg via ORAL
  Filled 2014-12-05: qty 1

## 2014-12-05 MED ORDER — METHOCARBAMOL 500 MG PO TABS
500.0000 mg | ORAL_TABLET | Freq: Four times a day (QID) | ORAL | Status: DC | PRN
  Administered 2014-12-06 – 2014-12-07 (×3): 500 mg via ORAL
  Filled 2014-12-05 (×3): qty 1

## 2014-12-05 MED ORDER — METHOCARBAMOL 1000 MG/10ML IJ SOLN: 500.0000 mg | Freq: Four times a day (QID) | INTRAVENOUS | Status: DC | PRN

## 2014-12-05 MED ORDER — PREGABALIN 75 MG PO CAPS
75.0000 mg | ORAL_CAPSULE | Freq: Every day | ORAL | Status: DC
  Administered 2014-12-05 – 2014-12-08 (×4): 75 mg via ORAL
  Filled 2014-12-05 (×4): qty 1

## 2014-12-05 MED ORDER — MORPHINE SULFATE 2 MG/ML IJ SOLN
1.0000 mg | INTRAMUSCULAR | Status: DC | PRN
  Administered 2014-12-05: 0.5 mg via INTRAVENOUS
  Filled 2014-12-05: qty 1

## 2014-12-05 MED ORDER — SUCCINYLCHOLINE CHLORIDE 20 MG/ML IJ SOLN
INTRAMUSCULAR | Status: DC | PRN
  Administered 2014-12-05: 80 mg via INTRAVENOUS

## 2014-12-05 MED ORDER — SODIUM CHLORIDE 0.9 % IV SOLN
INTRAVENOUS | Status: DC
  Administered 2014-12-05: 10:00:00 via INTRAVENOUS

## 2014-12-05 MED ORDER — HYDROCODONE-ACETAMINOPHEN 5-325 MG PO TABS
1.0000 | ORAL_TABLET | Freq: Four times a day (QID) | ORAL | Status: DC | PRN
  Administered 2014-12-05: 1 via ORAL
  Administered 2014-12-05: 2 via ORAL
  Administered 2014-12-05: 1 via ORAL
  Administered 2014-12-06 – 2014-12-09 (×6): 2 via ORAL
  Filled 2014-12-05: qty 1
  Filled 2014-12-05 (×7): qty 2
  Filled 2014-12-05: qty 1

## 2014-12-05 MED ORDER — CEFAZOLIN SODIUM-DEXTROSE 2-3 GM-% IV SOLR
INTRAVENOUS | Status: DC | PRN
  Administered 2014-12-05: 2 g via INTRAVENOUS

## 2014-12-05 MED ORDER — CEFAZOLIN SODIUM-DEXTROSE 2-3 GM-% IV SOLR
2.0000 g | Freq: Four times a day (QID) | INTRAVENOUS | Status: AC
  Administered 2014-12-06 (×2): 2 g via INTRAVENOUS
  Filled 2014-12-05 (×2): qty 50

## 2014-12-05 MED ORDER — EPHEDRINE SULFATE 50 MG/ML IJ SOLN
INTRAMUSCULAR | Status: AC
  Filled 2014-12-05: qty 1

## 2014-12-05 MED ORDER — LIDOCAINE HCL (CARDIAC) 20 MG/ML IV SOLN
INTRAVENOUS | Status: DC | PRN
  Administered 2014-12-05: 60 mg via INTRAVENOUS

## 2014-12-05 MED ORDER — ONDANSETRON HCL 4 MG/2ML IJ SOLN: 4.0000 mg | Freq: Four times a day (QID) | INTRAMUSCULAR | Status: DC | PRN

## 2014-12-05 MED ORDER — PROPOFOL 10 MG/ML IV BOLUS
INTRAVENOUS | Status: AC
  Filled 2014-12-05: qty 20

## 2014-12-05 MED ORDER — LEVETIRACETAM 750 MG PO TABS
750.0000 mg | ORAL_TABLET | Freq: Two times a day (BID) | ORAL | Status: DC
  Administered 2014-12-05 – 2014-12-09 (×8): 750 mg via ORAL
  Filled 2014-12-05 (×9): qty 1

## 2014-12-05 MED ORDER — FENTANYL CITRATE (PF) 100 MCG/2ML IJ SOLN
INTRAMUSCULAR | Status: DC | PRN
  Administered 2014-12-05: 25 ug via INTRAVENOUS

## 2014-12-05 MED ORDER — TRAMADOL HCL 50 MG PO TABS
50.0000 mg | ORAL_TABLET | Freq: Four times a day (QID) | ORAL | Status: DC | PRN
  Administered 2014-12-07 (×2): 50 mg via ORAL
  Filled 2014-12-05 (×2): qty 1

## 2014-12-05 MED ORDER — ACETAMINOPHEN 500 MG PO TABS
500.0000 mg | ORAL_TABLET | Freq: Four times a day (QID) | ORAL | Status: DC | PRN
  Administered 2014-12-06 (×3): 500 mg via ORAL
  Filled 2014-12-05 (×3): qty 1

## 2014-12-05 MED ORDER — FERROUS SULFATE 325 (65 FE) MG PO TABS
325.0000 mg | ORAL_TABLET | Freq: Every day | ORAL | Status: DC
  Administered 2014-12-06 – 2014-12-09 (×4): 325 mg via ORAL
  Filled 2014-12-05 (×4): qty 1

## 2014-12-05 MED ORDER — LACTATED RINGERS IV SOLN
INTRAVENOUS | Status: DC | PRN
  Administered 2014-12-05: 20:00:00 via INTRAVENOUS

## 2014-12-05 MED ORDER — CLOPIDOGREL BISULFATE 75 MG PO TABS
75.0000 mg | ORAL_TABLET | Freq: Every day | ORAL | Status: DC
Start: 2014-12-06 — End: 2014-12-09
  Administered 2014-12-06 – 2014-12-09 (×4): 75 mg via ORAL
  Filled 2014-12-05 (×4): qty 1

## 2014-12-05 MED ORDER — MORPHINE SULFATE 2 MG/ML IJ SOLN
0.5000 mg | INTRAMUSCULAR | Status: DC | PRN
Start: 2014-12-05 — End: 2014-12-05
  Administered 2014-12-05 (×4): 0.5 mg via INTRAVENOUS
  Filled 2014-12-05 (×4): qty 1

## 2014-12-05 MED ORDER — ESCITALOPRAM OXALATE 10 MG PO TABS
10.0000 mg | ORAL_TABLET | Freq: Every day | ORAL | Status: DC
  Administered 2014-12-06 – 2014-12-09 (×4): 10 mg via ORAL
  Filled 2014-12-05 (×4): qty 1

## 2014-12-05 MED ORDER — MIDAZOLAM HCL 2 MG/2ML IJ SOLN
INTRAMUSCULAR | Status: AC
  Filled 2014-12-05: qty 2

## 2014-12-05 MED ORDER — ENSURE ENLIVE PO LIQD
237.0000 mL | Freq: Two times a day (BID) | ORAL | Status: DC
  Administered 2014-12-08 – 2014-12-09 (×3): 237 mL via ORAL

## 2014-12-05 MED ORDER — ALUM & MAG HYDROXIDE-SIMETH 200-200-20 MG/5ML PO SUSP: 30.0000 mL | ORAL | Status: DC | PRN

## 2014-12-05 MED ORDER — FLEET ENEMA 7-19 GM/118ML RE ENEM: 1.0000 | ENEMA | Freq: Once | RECTAL | Status: AC | PRN

## 2014-12-05 MED ORDER — ISOSORBIDE MONONITRATE ER 30 MG PO TB24
30.0000 mg | ORAL_TABLET | Freq: Every day | ORAL | Status: DC
  Administered 2014-12-06 – 2014-12-09 (×4): 30 mg via ORAL
  Filled 2014-12-05 (×4): qty 1

## 2014-12-05 MED ORDER — FENTANYL CITRATE (PF) 250 MCG/5ML IJ SOLN
INTRAMUSCULAR | Status: AC
  Filled 2014-12-05: qty 5

## 2014-12-05 MED ORDER — ONDANSETRON HCL 4 MG/2ML IJ SOLN
INTRAMUSCULAR | Status: DC | PRN
  Administered 2014-12-05: 4 mg via INTRAVENOUS

## 2014-12-05 MED ORDER — ONDANSETRON HCL 4 MG PO TABS: 4.0000 mg | ORAL_TABLET | Freq: Four times a day (QID) | ORAL | Status: DC | PRN

## 2014-12-05 MED ORDER — FUROSEMIDE 20 MG PO TABS
20.0000 mg | ORAL_TABLET | Freq: Two times a day (BID) | ORAL | Status: DC
  Administered 2014-12-06 – 2014-12-09 (×7): 20 mg via ORAL
  Filled 2014-12-05 (×7): qty 1

## 2014-12-05 MED ORDER — SODIUM CHLORIDE 0.9 % IV SOLN
750.0000 mg | Freq: Two times a day (BID) | INTRAVENOUS | Status: DC
  Administered 2014-12-05: 750 mg via INTRAVENOUS
  Filled 2014-12-05 (×6): qty 7.5

## 2014-12-05 MED ORDER — ALBUTEROL SULFATE (2.5 MG/3ML) 0.083% IN NEBU: 2.5000 mg | INHALATION_SOLUTION | Freq: Four times a day (QID) | RESPIRATORY_TRACT | Status: DC | PRN

## 2014-12-05 MED ORDER — BUPIVACAINE-EPINEPHRINE 0.5% -1:200000 IJ SOLN
INTRAMUSCULAR | Status: DC | PRN
  Administered 2014-12-05: 10 mL

## 2014-12-05 MED ORDER — BUPIVACAINE-EPINEPHRINE (PF) 0.25% -1:200000 IJ SOLN
INTRAMUSCULAR | Status: AC
  Filled 2014-12-05: qty 30

## 2014-12-05 MED ORDER — BISACODYL 10 MG RE SUPP: 10.0000 mg | Freq: Every day | RECTAL | Status: DC | PRN

## 2014-12-05 MED ORDER — PRAVASTATIN SODIUM 40 MG PO TABS
40.0000 mg | ORAL_TABLET | Freq: Every day | ORAL | Status: DC
  Administered 2014-12-06 – 2014-12-08 (×3): 40 mg via ORAL
  Filled 2014-12-05 (×3): qty 1

## 2014-12-05 MED ORDER — NITROGLYCERIN 0.4 MG SL SUBL: 0.4000 mg | SUBLINGUAL_TABLET | SUBLINGUAL | Status: DC | PRN

## 2014-12-05 MED ORDER — FENTANYL CITRATE (PF) 100 MCG/2ML IJ SOLN: 25.0000 ug | INTRAMUSCULAR | Status: DC | PRN

## 2014-12-05 SURGICAL SUPPLY — 63 items
BLADE SAW SAG 73X25 THK (BLADE) ×2
BLADE SAW SGTL 73X25 THK (BLADE) ×1 IMPLANT
BRUSH FEMORAL CANAL (MISCELLANEOUS) ×2 IMPLANT
CAPT HIP HEMI 1 ×2 IMPLANT
COVER BACK TABLE 24X17X13 BIG (DRAPES) IMPLANT
DRAPE IMP U-DRAPE 54X76 (DRAPES) ×3 IMPLANT
DRAPE ORTHO SPLIT 77X108 STRL (DRAPES) ×6
DRAPE PROXIMA HALF (DRAPES) ×3 IMPLANT
DRAPE SURG ORHT 6 SPLT 77X108 (DRAPES) ×2 IMPLANT
DRAPE U-SHAPE 47X51 STRL (DRAPES) ×3 IMPLANT
DRILL BIT 7/64X5 (BIT) ×3 IMPLANT
DRSG MEPILEX BORDER 4X12 (GAUZE/BANDAGES/DRESSINGS) ×2 IMPLANT
DRSG PAD ABDOMINAL 8X10 ST (GAUZE/BANDAGES/DRESSINGS) ×3 IMPLANT
DURAPREP 26ML APPLICATOR (WOUND CARE) ×5 IMPLANT
ELECT BLADE 6.5 EXT (BLADE) ×2 IMPLANT
ELECT CAUTERY BLADE 6.4 (BLADE) ×3 IMPLANT
ELECT REM PT RETURN 9FT ADLT (ELECTROSURGICAL) ×3
ELECTRODE REM PT RTRN 9FT ADLT (ELECTROSURGICAL) ×1 IMPLANT
EVACUATOR 1/8 PVC DRAIN (DRAIN) IMPLANT
FACESHIELD WRAPAROUND (MASK) ×6 IMPLANT
FACESHIELD WRAPAROUND OR TEAM (MASK) ×1 IMPLANT
GAUZE SPONGE 4X4 12PLY STRL (GAUZE/BANDAGES/DRESSINGS) ×3 IMPLANT
GAUZE XEROFORM 5X9 LF (GAUZE/BANDAGES/DRESSINGS) ×3 IMPLANT
GLOVE BIO SURGEON STRL SZ 6.5 (GLOVE) ×3 IMPLANT
GLOVE BIO SURGEONS STRL SZ 6.5 (GLOVE) ×3
GLOVE BIOGEL PI IND STRL 6.5 (GLOVE) IMPLANT
GLOVE BIOGEL PI IND STRL 8 (GLOVE) ×2 IMPLANT
GLOVE BIOGEL PI INDICATOR 6.5 (GLOVE) ×6
GLOVE BIOGEL PI INDICATOR 8 (GLOVE) ×4
GLOVE ECLIPSE 7.5 STRL STRAW (GLOVE) ×8 IMPLANT
GOWN STRL REUS W/ TWL LRG LVL3 (GOWN DISPOSABLE) ×1 IMPLANT
GOWN STRL REUS W/ TWL XL LVL3 (GOWN DISPOSABLE) ×3 IMPLANT
GOWN STRL REUS W/TWL LRG LVL3 (GOWN DISPOSABLE) ×9
GOWN STRL REUS W/TWL XL LVL3 (GOWN DISPOSABLE) ×9
HANDPIECE INTERPULSE COAX TIP (DISPOSABLE) ×3
HOOD PEEL AWAY FACE SHEILD DIS (HOOD) ×2 IMPLANT
IMMOBILIZER KNEE 20 (SOFTGOODS) IMPLANT
KIT BASIN OR (CUSTOM PROCEDURE TRAY) ×3 IMPLANT
KIT ROOM TURNOVER OR (KITS) ×3 IMPLANT
MANIFOLD NEPTUNE II (INSTRUMENTS) ×3 IMPLANT
NDL 1/2 CIR MAYO (NEEDLE) IMPLANT
NEEDLE 1/2 CIR MAYO (NEEDLE) ×3 IMPLANT
NEEDLE 22X1 1/2 (OR ONLY) (NEEDLE) ×2 IMPLANT
NS IRRIG 1000ML POUR BTL (IV SOLUTION) ×3 IMPLANT
PACK TOTAL JOINT (CUSTOM PROCEDURE TRAY) ×3 IMPLANT
PACK UNIVERSAL I (CUSTOM PROCEDURE TRAY) ×3 IMPLANT
PAD ARMBOARD 7.5X6 YLW CONV (MISCELLANEOUS) ×6 IMPLANT
PASSER SUT SWANSON 36MM LOOP (INSTRUMENTS) ×2 IMPLANT
SET HNDPC FAN SPRY TIP SCT (DISPOSABLE) IMPLANT
SUCTION FRAZIER TIP 10 FR DISP (SUCTIONS) ×2 IMPLANT
SUT ETHIBOND 2 V 37 (SUTURE) ×3 IMPLANT
SUT PASSER 2.0 195M (MISCELLANEOUS) ×3 IMPLANT
SUT VIC AB 0 CT1 27 (SUTURE) ×6
SUT VIC AB 0 CT1 27XBRD ANBCTR (SUTURE) ×2 IMPLANT
SUT VIC AB 1 CTX 36 (SUTURE) ×6
SUT VIC AB 1 CTX36XBRD ANBCTR (SUTURE) ×2 IMPLANT
SUT VIC AB 2-0 CT1 36 (SUTURE) ×6 IMPLANT
SYR CONTROL 10ML LL (SYRINGE) ×2 IMPLANT
TOWEL OR 17X24 6PK STRL BLUE (TOWEL DISPOSABLE) ×3 IMPLANT
TOWEL OR 17X26 10 PK STRL BLUE (TOWEL DISPOSABLE) ×3 IMPLANT
TOWER CARTRIDGE SMART MIX (DISPOSABLE) IMPLANT
TRAY FOLEY CATH 16FRSI W/METER (SET/KITS/TRAYS/PACK) IMPLANT
WATER STERILE IRR 1000ML POUR (IV SOLUTION) ×12 IMPLANT

## 2014-12-05 NOTE — Anesthesia Procedure Notes (Signed)
Procedure Name: Intubation Date/Time: 12/05/2014 8:40 PM Performed by: Arlice ColtMANESS, Fidela Cieslak B Pre-anesthesia Checklist: Patient identified, Emergency Drugs available, Suction available, Patient being monitored and Timeout performed Patient Re-evaluated:Patient Re-evaluated prior to inductionOxygen Delivery Method: Circle system utilized Preoxygenation: Pre-oxygenation with 100% oxygen Intubation Type: IV induction and Rapid sequence Laryngoscope Size: Mac and 3 Grade View: Grade I Tube type: Oral Tube size: 7.0 mm Number of attempts: 1 Airway Equipment and Method: Stylet Placement Confirmation: ETT inserted through vocal cords under direct vision,  positive ETCO2 and breath sounds checked- equal and bilateral Secured at: 21 cm Tube secured with: Tape Dental Injury: Teeth and Oropharynx as per pre-operative assessment

## 2014-12-05 NOTE — Progress Notes (Signed)
INITIAL NUTRITION ASSESSMENT  Pt meets criteria for NON-SEVERE (MODERATE) MALNUTRITION in the context of chronic illness as evidenced by moderate fat mass loss and moderate to severe muscle mass loss.  DOCUMENTATION CODES Per approved criteria  -Non-severe (moderate) malnutrition in the context of chronic illness   INTERVENTION: Once diet advances, provide Ensure Enlive po BID, each supplement provides 350 kcal and 20 grams of protein.  NUTRITION DIAGNOSIS: Increased nutrient needs related to surgery as evidenced by estimated nutrition needs.   Goal: Pt to meet >/= 90% of their estimated nutrition needs   Monitor:  Diet advancement, weight trends, labs, I/O's  Reason for Assessment: MD consult  81 y.o. female  Admitting Dx: <principal problem not specified>  ASSESSMENT: Pt with history of CAD manage medically, seizure disorder, diastolic CHF, chronic kidney disease III presents after patient had a fall. X-rays revealed a right hip fracture.  Pt is currently NPO for surgery today. Pt reports her appetite PTA is fine with no other difficulties. Pt does report she drinks Ensure on occasion, however lately has not had any as she has been eating well. Pt reports usual body weight of 121 lbs. RD to order Ensure once diet advances to aid in caloric and protein needs as well as in healing.   Nutrition Focused Physical Exam:  Subcutaneous Fat:  Orbital Region: N/A Upper Arm Region: Mild to moderate depletion Thoracic and Lumbar Region: N/A  Muscle:  Temple Region: N/A Clavicle Bone Region: Moderate depletion Clavicle and Acromion Bone Region: Moderate depletion Scapular Bone Region: N/A Dorsal Hand: N/A Patellar Region: Moderate depletion Anterior Thigh Region: Mild to moderate depletion Posterior Calf Region: Severe depletion  Edema: non-pitting RLE  Labs: Low GFR. High creatinine.  Height: Ht Readings from Last 1 Encounters:  12/04/14  (1.575 m)    Weight: Wt  Readings from Last 1 Encounters:  12/04/14 121 lb (54.885 kg)    Ideal Body Weight: 110 lbs  % Ideal Body Weight: 110%  Wt Readings from Last 10 Encounters:  12/04/14 121 lb (54.885 kg)  04/09/14 127 lb 3.3 oz (57.7 kg)  12/19/13 130 lb (58.968 kg)  11/14/13 132 lb 4.8 oz (60.011 kg)  06/04/13 128 lb (58.06 kg)  05/06/13 128 lb (58.06 kg)  11/05/12 147 lb 3.2 oz (66.769 kg)  06/28/12 150 lb (68.04 kg)  04/12/12 144 lb 8 oz (65.545 kg)  04/06/12 144 lb (65.318 kg)    Usual Body Weight: 121 lbs per pt report  % Usual Body Weight: 100%  BMI:  Body mass index is 22.13 kg/(m^2).  Estimated Nutritional Needs: Kcal: 1650-1850 Protein: 75-90 grams Fluid: 1.6 - 1.8 L/day  Skin: Non-pitting RLE edema  Diet Order: Diet NPO time specified  EDUCATION NEEDS: -No education needs identified at this time   Intake/Output Summary (Last 24 hours) at 12/05/14 1015 Last data filed at 12/05/14 0600  Gross per 24 hour  Intake      0 ml  Output    600 ml  Net   -600 ml    Last BM: 4/14  Labs:   Recent Labs Lab 12/05/14 0055 12/05/14 0531  NA 137 137  K 3.9 4.2  CL 98 99  CO2 25 28  BUN 18 20  CREATININE 1.36* 1.44*  CALCIUM 9.0 8.5  GLUCOSE 152* 148*    CBG (last 3)   Recent Labs  12/05/14 0644  GLUCAP 143*    Scheduled Meds: . sodium chloride   Intravenous STAT  . levETIRAcetam  750 mg Intravenous Q12H    Continuous Infusions: . sodium chloride      Past Medical History  Diagnosis Date  . Fatigue   . Anxiety   . Dizziness   . CAD (coronary artery disease)     a. Cath 2004: 70% ostial LAD, 50-60% prox ramus disease, treated medically. b. Nuc 12/2009 - EF 74%, normal study.  . Hypertension   . Hyperlipidemia   . Coronary artery disease   . Anemia     a. Chronic iron deficiency anemia  . Syncope and collapse     RECURRENT, RELATED TO HYPONATREMIA, NARCOTICS, AND BRADYCARDIA  . Tobacco abuse     PAST  . Memory disorder   . Seizure disorder   .  Depression   . Chronic diastolic CHF (congestive heart failure)     a. Echo 02/2014: mild focal basal hypertrophy of septum, Normal LV function EF 55-60%; grade 1 diastolic dysfunction; mildly elevated PASP.  Marland Kitchen. GERD (gastroesophageal reflux disease)   . COPD (chronic obstructive pulmonary disease)   . CKD (chronic kidney disease), stage III     Past Surgical History  Procedure Laterality Date  . Cardiac catheterization  2004  . Abdominal hysterectomy      early 1970's for fibroid    Marijean NiemannStephanie La, MS, RD, LDN Pager # [phone] After hours/ weekend pager # [phone]

## 2014-12-05 NOTE — H&P (Signed)
[facility] History and Physical  [name] [name] ZOX:[id number] DOB: 06-19-1927 DOA: 12/04/2014  Referring physician: Dr. Clarice Pole. ER physician. PCP: [name], MD   Chief Complaint: Right hip pain.  HPI: [name] [name] is a 81 y.o. female with history of CAD manage medically, seizure disorder, diastolic CHF last EF measured was 74% in May 2011, chronic kidney disease was brought to the ER after patient had a fall at her house. Patient states she was walking from the bathroom to her cerumen she suddenly fell. Denies any loss of consciousness or hitting her head. Denies any chest pain or shortness of breath or palpitations. In the ER x-rays revealed a right hip fracture and on-call orthopedic surgeon Dr. Luiz Blare has been consulted and patient has been admitted for further management. Patient on exam denies any chest pain shortness of breath palpitations headache or any visual symptoms.   Review of Systems: As presented in the history of presenting illness, rest negative.  Past Medical History  Diagnosis Date  . Fatigue   . Anxiety   . Dizziness   . CAD (coronary artery disease)     a. Cath 2004: 70% ostial LAD, 50-60% prox ramus disease, treated medically. b. Nuc 12/2009 - EF 74%, normal study.  . Hypertension   . Hyperlipidemia   . Coronary artery disease   . Anemia     a. Chronic iron deficiency anemia  . Syncope and collapse     RECURRENT, RELATED TO HYPONATREMIA, NARCOTICS, AND BRADYCARDIA  . Tobacco abuse     PAST  . Memory disorder   . Seizure disorder   . Depression   . Chronic diastolic CHF (congestive heart failure)     a. Echo 02/2014: mild focal basal hypertrophy of septum, Normal LV function EF 55-60%; grade 1 diastolic dysfunction; mildly elevated PASP.  Marland Kitchen GERD (gastroesophageal reflux disease)   . COPD (chronic obstructive pulmonary disease)   . CKD (chronic kidney disease), stage III    Past Surgical History  Procedure Laterality Date  . Cardiac  catheterization  2004  . Abdominal hysterectomy      early 1970's for fibroid   Social History:  reports that she quit smoking about 15 years ago. She has never used smokeless tobacco. She reports that she does not drink alcohol or use illicit drugs. Where does patient live at home. Can patient participate in ADLs? Yes.  Allergies  Allergen Reactions  . Codeine Nausea And Vomiting    Family History:  Family History  Problem Relation Age of Onset  . Heart attack Father   . Heart failure Mother   . CAD Father   . Congestive Heart Failure Mother   . Other Brother     cerebral hemmorhage  . Coronary artery disease Sister       Prior to Admission medications   Medication Sig Start Date End Date Taking? Authorizing Provider  acetaminophen (TYLENOL) 500 MG tablet Take 1,000 mg by mouth every 6 (six) hours as needed. For pain.    Historical Provider, MD  albuterol (PROVENTIL) (2.5 MG/3ML) 0.083% nebulizer solution Take 3 mLs (2.5 mg total) by nebulization every 6 (six) hours as needed for wheezing. Patient not taking: Reported on 08/21/2014 04/09/14   [name], MD  Calcium Carbonate-Vitamin D (CALCIUM 600 + D PO) Take 1 tablet by mouth daily.     Historical Provider, MD  clopidogrel (PLAVIX) 75 MG tablet Take 75 mg by mouth daily.    Historical Provider, MD  diphenhydrAMINE (BENADRYL)  25 MG tablet Take 25 mg by mouth every 6 (six) hours as needed for allergies.    Historical Provider, MD  escitalopram (LEXAPRO) 10 MG tablet Take 10 mg by mouth daily.    Historical Provider, MD  furosemide (LASIX) 20 MG tablet Take 20 mg by mouth 2 (two) times daily.    Historical Provider, MD  HYDROcodone-acetaminophen (NORCO/VICODIN) 5-325 MG per tablet Take 1-2 tablets by mouth every 6 (six) hours as needed. 11/19/14   [name], MD  isosorbide mononitrate (IMDUR) 30 MG 24 hr tablet Take 30 mg by mouth daily.    Historical Provider, MD  levETIRAcetam (KEPPRA) 500 MG tablet Take 750 mg by mouth  every 12 (twelve) hours.  06/28/12   [name], DO  loperamide (IMODIUM) 2 MG capsule Take 1 capsule (2 mg total) by mouth 4 (four) times daily as needed for diarrhea or loose stools. 08/21/14   [name], DO  LORazepam (ATIVAN) 0.5 MG tablet Take 0.5 tablets (0.25 mg total) by mouth 2 (two) times daily as needed for anxiety. 10/04/14   [name], PA-C  nitroGLYCERIN (NITROSTAT) 0.4 MG SL tablet Place 0.4 mg under the tongue every 5 (five) minutes as needed for chest pain.    Historical Provider, MD  ondansetron (ZOFRAN ODT) 4 MG disintegrating tablet Take 1 tablet (4 mg total) by mouth every 8 (eight) hours as needed for nausea or vomiting. 08/21/14   [name], DO  traMADol (ULTRAM) 50 MG tablet Take 50 mg by mouth every 6 (six) hours as needed for moderate pain.    Historical Provider, MD    Physical Exam: Filed Vitals:   12/05/14 0115 12/05/14 0120 12/05/14 0130 12/05/14 0200  BP: 133/71 133/71 116/59 119/60  Pulse: 85 96 84 80  Temp:  97.8 F (36.6 C)    TempSrc:  Oral    Resp: 17 26  26   Height:      Weight:      SpO2: 100% 97% 97% 97%     General:  Moderately built and poorly nourished.  Eyes: Anicteric no pallor.  ENT: No discharge from ears eyes nose or mouth.  Neck: No mass felt.  Cardiovascular: S1-S2 heard.  Respiratory: No rhonchi or crepitations.  Abdomen: Soft nontender bowel sounds present.  Skin: No rash.  Musculoskeletal: Pain on moving the right hip.  Psychiatric: Appears normal.  Neurologic: Alert awake oriented to time place and person. Moves all extremities.  Labs on Admission:  Basic Metabolic Panel:  Recent Labs Lab 12/05/14 0055  NA 137  K 3.9  CL 98  CO2 25  GLUCOSE 152*  BUN 18  CREATININE 1.36*  CALCIUM 9.0   Liver Function Tests: No results for input(s): AST, ALT, ALKPHOS, BILITOT, PROT, ALBUMIN in the last 168 hours. No results for input(s): LIPASE, AMYLASE in the last 168 hours. No results for input(s):  AMMONIA in the last 168 hours. CBC:  Recent Labs Lab 12/05/14 0055  WBC 13.9*  NEUTROABS 11.3*  HGB 11.0*  HCT 32.8*  MCV 93.7  PLT 316   Cardiac Enzymes: No results for input(s): CKTOTAL, CKMB, CKMBINDEX, TROPONINI in the last 168 hours.  BNP (last 3 results)  Recent Labs  10/03/14 2205  BNP 89.5    ProBNP (last 3 results) No results for input(s): PROBNP in the last 8760 hours.  CBG: No results for input(s): GLUCAP in the last 168 hours.  Radiological Exams on Admission: Dg Chest 1 View  12/05/2014  CLINICAL DATA:  Hip injury. History of coronary artery disease, hypertension and CHF.  EXAM: CHEST  1 VIEW  COMPARISON:  10/03/2014  FINDINGS: Unchanged cardiomediastinal contours allowing for differences in technique. Hiatal hernia again seen. There is emphysema. Pulmonary vasculature is normal. No consolidation, pleural effusion, or pneumothorax. A skin fold projects over the left hemithorax. No acute osseous abnormalities are seen.  IMPRESSION: No acute process.   Electronically Signed   By: Rubye Oaks M.D.   On: 12/05/2014 01:52   Dg Hip Unilat  With Pelvis 2-3 Views Right  12/05/2014   CLINICAL DATA:  Right hip injury coming out of the bathroom  EXAM: RIGHT HIP (WITH PELVIS) 2-3 VIEWS  COMPARISON:  None.  FINDINGS: Right femoral neck fracture, transcervical or subcapital. The fracture is displaced with femoral head rotation and varus angulation. The femoral head remains located.  No evidence of pelvic ring fracture or diastasis. The left hip is negative.  Osteopenia.  IMPRESSION: Displaced right femoral neck fracture.   Electronically Signed   By: Marnee Spring M.D.   On: 12/05/2014 00:51    EKG: Independently reviewed. Normal sinus rhythm with nonspecific ST-T changes.  Assessment/Plan Active Problems:   Seizure   Hypertension   Hyperlipidemia   Hip fracture   CKD (chronic kidney disease) stage 3, GFR 30-59 ml/min   Closed right hip fracture   1. Right hip  fracture - after mechanical fall. Dr. Luiz Blare on-call orthopedic surgeon has been consulted. At this time in anticipation of surgery patient will be kept nothing by mouth. Patient has moderate risk for intermediate risk procedure. Continue with daily medications. While nothing by mouth closely follow CBGs. 2. CAD - being managed medically. Patient denies any chest pain. Patient's fall mechanical. Patient is usually on Plavix which will be at this time held in anticipation of surgery and restarted once okay with surgeon. 3. Diastolic CHF last EF measured was 74% in May 2011 with stress Myoview - patient at this time appears compensated. Lasix is on hold this can be restarted after surgery if patient is clinically stable. 4. Seizure disorder - until patient can take orally I have placed patient Keppra IV. 5. Chronic kidney disease stage III - creatinine appears to be at baseline. 6. Chronic anemia - follow CBC.   DVT Prophylaxis SCDs.  Code Status: Full code.  Family Communication: None.  Disposition Plan: Admit to inpatient.    KAKRAKANDY,ARSHAD N. [facility] Pager [phone].  If 7PM-7AM, please contact night-coverage [email or web address] Password [email or web address] 12/05/2014, 2:23 AM

## 2014-12-05 NOTE — Progress Notes (Signed)
Patient seen and evaluated earlier the same by my associate. Please refer to his H&P for details regarding assessment and plan  Physical exam General patient in no acute distress alert and awake CV: S1-S2 normal limits no rubs Pulmonary: No increased work of breathing, equal chest rise, no wheezes  We'll reassess next a.m. or sooner should any acute medical conditions arise.  Derwood Becraft, [employer]

## 2014-12-05 NOTE — Progress Notes (Signed)
Utilization review completed.  

## 2014-12-05 NOTE — ED Notes (Signed)
Ortho tech in room with this RN and Onalee Huaavid, [employer]. Pt moved to hospital bed. Ortho tech to put bucks traction in place on right leg.

## 2014-12-05 NOTE — Progress Notes (Signed)
[facility]  Patient Status: Active (receiving services up to time of hospitalization)  [facility] is providing the following services: RN, PT, OT, MSW and HHA  If patient discharges after hours, please call [phone].   [name] [name] 12/05/2014, 12:53 PM

## 2014-12-05 NOTE — Consult Note (Signed)
Reason for Consult:r hip fracture Referring Physician: hospitalists  [name] [name] is an 81 y.o. female.  HPI: 81 yo female who fell earlier today and suffered a r femoral neck fracture.  She was admitted and cleared by int medicine and we are consulted fior management of r femoral neck fracture.  Past Medical History  Diagnosis Date  . Fatigue   . Anxiety   . Dizziness   . CAD (coronary artery disease)     a. Cath 2004: 70% ostial LAD, 50-60% prox ramus disease, treated medically. b. Nuc 12/2009 - EF 74%, normal study.  . Hypertension   . Hyperlipidemia   . Coronary artery disease   . Anemia     a. Chronic iron deficiency anemia  . Syncope and collapse     RECURRENT, RELATED TO HYPONATREMIA, NARCOTICS, AND BRADYCARDIA  . Tobacco abuse     PAST  . Memory disorder   . Seizure disorder   . Depression   . Chronic diastolic CHF (congestive heart failure)     a. Echo 02/2014: mild focal basal hypertrophy of septum, Normal LV function EF 12-19%; grade 1 diastolic dysfunction; mildly elevated PASP.  Marland Kitchen GERD (gastroesophageal reflux disease)   . COPD (chronic obstructive pulmonary disease)   . CKD (chronic kidney disease), stage III     Past Surgical History  Procedure Laterality Date  . Cardiac catheterization  2004  . Abdominal hysterectomy      early 1970's for fibroid    Family History  Problem Relation Age of Onset  . Heart attack Father   . Heart failure Mother   . CAD Father   . Congestive Heart Failure Mother   . Other Brother     cerebral hemmorhage  . Coronary artery disease Sister     Social History:  reports that she quit smoking about 15 years ago. She has never used smokeless tobacco. She reports that she does not drink alcohol or use illicit drugs.  Allergies:  Allergies  Allergen Reactions  . Codeine Nausea And Vomiting    Medications: I have reviewed the patient's current medications.  Results for orders placed or performed during the hospital  encounter of 12/04/14 (from the past 48 hour(s))  Type and screen     Status: None   Collection Time: 12/05/14 12:53 AM  Result Value Ref Range   ABO/RH(D) O POS    Antibody Screen NEG    Sample Expiration 12/08/2014   ABO/Rh     Status: None   Collection Time: 12/05/14 12:53 AM  Result Value Ref Range   ABO/RH(D) O POS   Basic metabolic panel     Status: Abnormal   Collection Time: 12/05/14 12:55 AM  Result Value Ref Range   Sodium 137 135 - 145 mmol/L   Potassium 3.9 3.5 - 5.1 mmol/L   Chloride 98 96 - 112 mmol/L   CO2 25 19 - 32 mmol/L   Glucose, Bld 152 (H) 70 - 99 mg/dL   BUN 18 6 - 23 mg/dL   Creatinine, Ser 1.36 (H) 0.50 - 1.10 mg/dL   Calcium 9.0 8.4 - 10.5 mg/dL   GFR calc non Af Amer 34 (L) >90 mL/min   GFR calc Af Amer 40 (L) >90 mL/min    Comment: (NOTE) The eGFR has been calculated using the CKD EPI equation. This calculation has not been validated in all clinical situations. eGFR's persistently <90 mL/min signify possible Chronic Kidney Disease.    Anion gap 14 5 -  15  CBC with Differential     Status: Abnormal   Collection Time: 12/05/14 12:55 AM  Result Value Ref Range   WBC 13.9 (H) 4.0 - 10.5 K/uL   RBC 3.50 (L) 3.87 - 5.11 MIL/uL   Hemoglobin 11.0 (L) 12.0 - 15.0 g/dL   HCT 32.8 (L) 36.0 - 46.0 %   MCV 93.7 78.0 - 100.0 fL   MCH 31.4 26.0 - 34.0 pg   MCHC 33.5 30.0 - 36.0 g/dL   RDW 13.3 11.5 - 15.5 %   Platelets 316 150 - 400 K/uL   Neutrophils Relative % 81 (H) 43 - 77 %   Neutro Abs 11.3 (H) 1.7 - 7.7 K/uL   Lymphocytes Relative 14 12 - 46 %   Lymphs Abs 1.9 0.7 - 4.0 K/uL   Monocytes Relative 5 3 - 12 %   Monocytes Absolute 0.6 0.1 - 1.0 K/uL   Eosinophils Relative 0 0 - 5 %   Eosinophils Absolute 0.0 0.0 - 0.7 K/uL   Basophils Relative 0 0 - 1 %   Basophils Absolute 0.0 0.0 - 0.1 K/uL  Protime-INR     Status: None   Collection Time: 12/05/14 12:55 AM  Result Value Ref Range   Prothrombin Time 13.5 11.6 - 15.2 seconds   INR 1.01 0.00 -  1.49  Comprehensive metabolic panel     Status: Abnormal   Collection Time: 12/05/14  5:31 AM  Result Value Ref Range   Sodium 137 135 - 145 mmol/L   Potassium 4.2 3.5 - 5.1 mmol/L   Chloride 99 96 - 112 mmol/L   CO2 28 19 - 32 mmol/L   Glucose, Bld 148 (H) 70 - 99 mg/dL   BUN 20 6 - 23 mg/dL   Creatinine, Ser 1.44 (H) 0.50 - 1.10 mg/dL   Calcium 8.5 8.4 - 10.5 mg/dL   Total Protein 6.1 6.0 - 8.3 g/dL   Albumin 3.5 3.5 - 5.2 g/dL   AST 22 0 - 37 U/L   ALT 12 0 - 35 U/L   Alkaline Phosphatase 69 39 - 117 U/L   Total Bilirubin 0.6 0.3 - 1.2 mg/dL   GFR calc non Af Amer 32 (L) >90 mL/min   GFR calc Af Amer 37 (L) >90 mL/min    Comment: (NOTE) The eGFR has been calculated using the CKD EPI equation. This calculation has not been validated in all clinical situations. eGFR's persistently <90 mL/min signify possible Chronic Kidney Disease.    Anion gap 10 5 - 15  CBC WITH DIFFERENTIAL     Status: Abnormal   Collection Time: 12/05/14  5:31 AM  Result Value Ref Range   WBC 11.3 (H) 4.0 - 10.5 K/uL   RBC 3.13 (L) 3.87 - 5.11 MIL/uL   Hemoglobin 9.9 (L) 12.0 - 15.0 g/dL   HCT 30.0 (L) 36.0 - 46.0 %   MCV 95.8 78.0 - 100.0 fL   MCH 31.6 26.0 - 34.0 pg   MCHC 33.0 30.0 - 36.0 g/dL   RDW 13.4 11.5 - 15.5 %   Platelets 311 150 - 400 K/uL   Neutrophils Relative % 80 (H) 43 - 77 %   Neutro Abs 9.0 (H) 1.7 - 7.7 K/uL   Lymphocytes Relative 13 12 - 46 %   Lymphs Abs 1.5 0.7 - 4.0 K/uL   Monocytes Relative 7 3 - 12 %   Monocytes Absolute 0.8 0.1 - 1.0 K/uL   Eosinophils Relative 0 0 - 5 %  Eosinophils Absolute 0.0 0.0 - 0.7 K/uL   Basophils Relative 0 0 - 1 %   Basophils Absolute 0.0 0.0 - 0.1 K/uL  Urinalysis, Routine w reflex microscopic     Status: None   Collection Time: 12/05/14  6:01 AM  Result Value Ref Range   Color, Urine YELLOW YELLOW   APPearance CLEAR CLEAR   Specific Gravity, Urine 1.009 1.005 - 1.030   pH 6.5 5.0 - 8.0   Glucose, UA NEGATIVE NEGATIVE mg/dL   Hgb  urine dipstick NEGATIVE NEGATIVE   Bilirubin Urine NEGATIVE NEGATIVE   Ketones, ur NEGATIVE NEGATIVE mg/dL   Protein, ur NEGATIVE NEGATIVE mg/dL   Urobilinogen, UA 0.2 0.0 - 1.0 mg/dL   Nitrite NEGATIVE NEGATIVE   Leukocytes, UA NEGATIVE NEGATIVE    Comment: MICROSCOPIC NOT DONE ON URINES WITH NEGATIVE PROTEIN, BLOOD, LEUKOCYTES, NITRITE, OR GLUCOSE <1000 mg/dL.  Glucose, capillary     Status: Abnormal   Collection Time: 12/05/14  6:44 AM  Result Value Ref Range   Glucose-Capillary 143 (H) 70 - 99 mg/dL  Glucose, capillary     Status: None   Collection Time: 12/05/14 11:47 AM  Result Value Ref Range   Glucose-Capillary 97 70 - 99 mg/dL  Surgical pcr screen     Status: None   Collection Time: 12/05/14 12:33 PM  Result Value Ref Range   MRSA, PCR NEGATIVE NEGATIVE   Staphylococcus aureus NEGATIVE NEGATIVE    Comment:        The Xpert SA Assay (FDA approved for NASAL specimens in patients over 46 years of age), is one component of a comprehensive surveillance program.  Test performance has been validated by [facility] for patients greater than or equal to 43 year old. It is not intended to diagnose infection nor to guide or monitor treatment.   Glucose, capillary     Status: Abnormal   Collection Time: 12/05/14  4:37 PM  Result Value Ref Range   Glucose-Capillary 102 (H) 70 - 99 mg/dL    Dg Chest 1 View  12/05/2014   CLINICAL DATA:  Hip injury. History of coronary artery disease, hypertension and CHF.  EXAM: CHEST  1 VIEW  COMPARISON:  10/03/2014  FINDINGS: Unchanged cardiomediastinal contours allowing for differences in technique. Hiatal hernia again seen. There is emphysema. Pulmonary vasculature is normal. No consolidation, pleural effusion, or pneumothorax. A skin fold projects over the left hemithorax. No acute osseous abnormalities are seen.  IMPRESSION: No acute process.   Electronically Signed   By: Jeb Levering M.D.   On: 12/05/2014 01:52   Dg Hip Unilat  With  Pelvis 2-3 Views Right  12/05/2014   CLINICAL DATA:  Right hip injury coming out of the bathroom  EXAM: RIGHT HIP (WITH PELVIS) 2-3 VIEWS  COMPARISON:  None.  FINDINGS: Right femoral neck fracture, transcervical or subcapital. The fracture is displaced with femoral head rotation and varus angulation. The femoral head remains located.  No evidence of pelvic ring fracture or diastasis. The left hip is negative.  Osteopenia.  IMPRESSION: Displaced right femoral neck fracture.   Electronically Signed   By: Monte Fantasia M.D.   On: 12/05/2014 00:51    ROS  ROS: I have reviewed the patient's review of systems thoroughly and there are no positive responses as relates to the HPI. EXAM: Blood pressure 131/55, pulse 84, temperature 98.5 F (36.9 C), temperature source Oral, resp. rate 16, height '5\' 2"'  (1.575 m), weight 121 lb (54.885 kg), SpO2 94 %. Physical  Exam Well-developed well-nourished patient in no acute distress. Alert and oriented x3 HEENT:within normal limits Cardiac: Regular rate and rhythm Pulmonary: Lungs clear to auscultation Abdomen: Soft and nontender.  Normal active bowel sounds  Musculoskeletal: (R HIP: externally rotated and shortened with pain on all rom  Assessment/Plan: 81 yo female with r fem neck fracture.  // plan for r hemi arthroplasty.  Discussed risks and benefits of surgery including bleeding, infection, dislocation and need for further surgery.  She also understands slight risk of death in and around surgery. She and family wish to proceed with surgery.   Ronte Parker L 12/05/2014, 6:38 PM

## 2014-12-05 NOTE — Anesthesia Preprocedure Evaluation (Addendum)
Anesthesia Evaluation  Patient identified by MRN, date of birth, ID band Patient awake    Reviewed: Allergy & Precautions, NPO status , Patient's Chart, lab work & pertinent test results  Airway Mallampati: II  TM Distance: >3 FB Neck ROM: Full    Dental  (+) Edentulous Upper, Edentulous Lower   Pulmonary shortness of breath, COPDformer smoker,  breath sounds clear to auscultation        Cardiovascular hypertension, + CAD and +CHF Rhythm:Regular Rate:Normal  02/2014 TTE: EF 60%. Grade I diastolic dysfunction.   Neuro/Psych Seizures -,  Anxiety Depression    GI/Hepatic Neg liver ROS, GERD-  ,  Endo/Other  negative endocrine ROS  Renal/GU Renal InsufficiencyRenal disease     Musculoskeletal negative musculoskeletal ROS (+)   Abdominal   Peds  Hematology  (+) anemia , Hgb 9.9   Anesthesia Other Findings   Reproductive/Obstetrics                           Anesthesia Physical Anesthesia Plan  ASA: III  Anesthesia Plan: General   Post-op Pain Management:    Induction: Intravenous  Airway Management Planned: Oral ETT  Additional Equipment:   Intra-op Plan:   Post-operative Plan: Extubation in OR  Informed Consent: I have reviewed the patients History and Physical, chart, labs and discussed the procedure including the risks, benefits and alternatives for the proposed anesthesia with the patient or authorized representative who has indicated his/her understanding and acceptance.   Dental advisory given  Plan Discussed with: CRNA, Anesthesiologist and Surgeon  Anesthesia Plan Comments:        Anesthesia Quick Evaluation

## 2014-12-06 DIAGNOSIS — S72001S Fracture of unspecified part of neck of right femur, sequela: Secondary | ICD-10-CM

## 2014-12-06 DIAGNOSIS — I10 Essential (primary) hypertension: Secondary | ICD-10-CM

## 2014-12-06 DIAGNOSIS — E785 Hyperlipidemia, unspecified: Secondary | ICD-10-CM

## 2014-12-06 DIAGNOSIS — E440 Moderate protein-calorie malnutrition: Secondary | ICD-10-CM

## 2014-12-06 LAB — URINE CULTURE
COLONY COUNT: NO GROWTH
CULTURE: NO GROWTH

## 2014-12-06 LAB — BASIC METABOLIC PANEL
Anion gap: 12 (ref 5–15)
BUN: 17 mg/dL (ref 6–23)
CALCIUM: 8.1 mg/dL — AB (ref 8.4–10.5)
CHLORIDE: 100 mmol/L (ref 96–112)
CO2: 25 mmol/L (ref 19–32)
Creatinine, Ser: 1.2 mg/dL — ABNORMAL HIGH (ref 0.50–1.10)
GFR calc Af Amer: 46 mL/min — ABNORMAL LOW (ref >90)
GFR calc non Af Amer: 40 mL/min — ABNORMAL LOW (ref >90)
GLUCOSE: 129 mg/dL — AB (ref 70–99)
Potassium: 4.5 mmol/L (ref 3.5–5.1)
Sodium: 137 mmol/L (ref 135–145)

## 2014-12-06 LAB — CBC
HCT: 26.8 % — ABNORMAL LOW (ref 36.0–46.0)
Hemoglobin: 8.8 g/dL — ABNORMAL LOW (ref 12.0–15.0)
MCH: 31.1 pg (ref 26.0–34.0)
MCHC: 32.8 g/dL (ref 30.0–36.0)
MCV: 94.7 fL (ref 78.0–100.0)
PLATELETS: 241 10*3/uL (ref 150–400)
RBC: 2.83 MIL/uL — ABNORMAL LOW (ref 3.87–5.11)
RDW: 13.3 % (ref 11.5–15.5)
WBC: 8.5 10*3/uL (ref 4.0–10.5)

## 2014-12-06 LAB — URINALYSIS, ROUTINE W REFLEX MICROSCOPIC
BILIRUBIN URINE: NEGATIVE
Glucose, UA: NEGATIVE mg/dL
Ketones, ur: NEGATIVE mg/dL
LEUKOCYTES UA: NEGATIVE
Nitrite: NEGATIVE
PH: 5 (ref 5.0–8.0)
PROTEIN: NEGATIVE mg/dL
Specific Gravity, Urine: 1.011 (ref 1.005–1.030)
Urobilinogen, UA: 0.2 mg/dL (ref 0.0–1.0)

## 2014-12-06 LAB — URINE MICROSCOPIC-ADD ON

## 2014-12-06 LAB — GLUCOSE, CAPILLARY
GLUCOSE-CAPILLARY: 135 mg/dL — AB (ref 70–99)
GLUCOSE-CAPILLARY: 210 mg/dL — AB (ref 70–99)
GLUCOSE-CAPILLARY: 134 mg/dL — AB (ref 70–99)
Glucose-Capillary: 126 mg/dL — ABNORMAL HIGH (ref 70–99)
Glucose-Capillary: 131 mg/dL — ABNORMAL HIGH (ref 70–99)

## 2014-12-06 LAB — VITAMIN D 25 HYDROXY (VIT D DEFICIENCY, FRACTURES): VIT D 25 HYDROXY: 35.1 ng/mL (ref 30.0–100.0)

## 2014-12-06 NOTE — Progress Notes (Signed)
Physical Therapy Evaluation Patient Details Name: [name] [name] MRN: [id number] DOB: 11-22-1926 Today's Date: 12/06/2014   History of Present Illness  81 yo female admitted due to mechanical fall and femoral neck fracture with R hip replacement surgery 12/05/14.  Prior Med Hx: CAD, seizure disorder, CHF with EF 74%, CKD.  Clinical Impression  Patient mildly confused, wants to get up to bathroom.  Set up with nursing for bedside commode, MAX assist for pivot transfer, limited by pain and R LE weakness.  Unable to void, returned to bed due to fatigue.  Patient is appropriate for skilled PT services, and will be placed on caseload, likely will need low/mod intensity daily therapy to regain function.    Follow Up Recommendations SNF;Supervision/Assistance - 24 hour    Equipment Recommendations  None recommended by PT (has cane and walker at home)    Recommendations for Other Services       Precautions / Restrictions Precautions Precautions: Posterior Hip;Fall Precaution Booklet Issued: Yes (comment) Restrictions Weight Bearing Restrictions: Yes RLE Weight Bearing: Weight bearing as tolerated      Mobility  Bed Mobility Overal bed mobility: Needs Assistance Bed Mobility: Rolling;Supine to Sit Rolling: Mod assist   Supine to sit: Max assist     General bed mobility comments: Limit by pain and confusion  Transfers Overall transfer level: Needs assistance Equipment used: Rolling walker (2 wheeled) Transfers: Sit to/from [employer] Sit to Stand: Mod assist;From elevated surface Stand pivot transfers: Max assist       General transfer comment: Painful, not tolerating full weight R LE  Ambulation/Gait Ambulation/Gait assistance:  (Unable on evaluation, pivot transfer only)              Stairs            Wheelchair Mobility    Modified Rankin (Stroke Patients Only)       Balance                                              Pertinent Vitals/Pain Pain Assessment: 0-10 Pain Score: 6  Pain Location: R hip with movement Pain Intervention(s): Limited activity within patient's tolerance;Monitored during session;RN gave pain meds during session    Home Living Family/patient expects to be discharged to:: Unsure                 Additional Comments: Likely SNF    Prior Function Level of Independence: Independent with assistive device(s)               Hand Dominance        Extremity/Trunk Assessment   Upper Extremity Assessment: Overall WFL for tasks assessed;Defer to OT evaluation           Lower Extremity Assessment: RLE deficits/detail RLE Deficits / Details: Weakness and pain due to acute hip replacement surgery       Communication   Communication: No difficulties  Cognition Arousal/Alertness: Awake/alert Behavior During Therapy: WFL for tasks assessed/performed Overall Cognitive Status: Impaired/Different from baseline Area of Impairment: Orientation;Problem solving Orientation Level: Person;Place           Problem Solving: Decreased initiation;Difficulty sequencing General Comments: Mild/moderate confusion possibly related to surgery    General Comments      Exercises Total Joint Exercises Heel Slides: AAROM;Right;10 reps;Supine Hip ABduction/ADduction: AAROM;Right;5 reps;Supine      Assessment/Plan  PT Assessment Patient needs continued PT services  PT Diagnosis Difficulty walking;Generalized weakness;Acute pain;Altered mental status   PT Problem List Decreased strength;Decreased activity tolerance;Decreased mobility;Decreased safety awareness;Decreased knowledge of precautions;Pain  PT Treatment Interventions Functional mobility training;Gait training;Therapeutic activities;Therapeutic exercise;Patient/family education   PT Goals (Current goals can be found in the Care Plan section) Acute Rehab PT Goals Patient Stated Goal: "I may need to go to Angie's  (daughter)" PT Goal Formulation: With patient Time For Goal Achievement: 12/20/14 Potential to Achieve Goals: Good    Frequency 7X/week   Barriers to discharge Decreased caregiver support Will benefit from significant physical assist 24/7 at this time.    Co-evaluation               End of Session Equipment Utilized During Treatment: Gait belt;Right knee immobilizer (immobilizer in bed (for hip precautions)) Activity Tolerance: Patient limited by pain;Patient limited by fatigue Patient left: in bed;with call bell/phone within reach Nurse Communication: Mobility status;Precautions         Time: 1235-1300 PT Time Calculation (min) (ACUTE ONLY): 25 min   Charges:   PT Evaluation $Initial PT Evaluation Tier I: 1 Procedure PT Treatments $Therapeutic Activity: 8-22 mins   PT G Codes:        Neidy Guerrieri L 12/06/2014, 2:00 PM

## 2014-12-06 NOTE — Progress Notes (Signed)
[facility] PROGRESS NOTE  [name] [name] ZOX:[id number] DOB: Nov 24, 1926 DOA: 12/04/2014 PCP: [name], MD  Assessment/Plan: Principal Problem:   Fracture of femoral neck, right - Ortho Evra on board and assisting with management - Patient is status post 1 day postop arthroplasty Bipolar hip (right) - Continue supportive therapy - Physical therapy evaluation  Active Problems:   Seizure - Table no breakthrough seizures reported, continue current regimen    Hypertension - Patient is currently on Imdur  Fever - Postop day 1 may be secondary to atelectasis - WBC levels within normal limits patient currently covered with Ancef - Should fever recur we'll plan on obtaining blood cultures, urine cultures    Hyperlipidemia - Stable patient on Plavix and statin    CKD (chronic kidney disease) stage 3, GFR 30-59 ml/min - Stable    Malnutrition of moderate degree -Agree with registered dietitian's findings. Please refer to their note on 12/05/2014. Continue ensure  Code Status: Full Family Communication: No family at bedside Disposition Plan: Pending improvement in condition   Consultants:  Orthopedic  Procedures:  None  Antibiotics:  Ancef  HPI/Subjective: Patient states she feels better. No new complaints states that pain is tolerable with pain medication  Objective: Filed Vitals:   12/06/14 0630  BP: 120/48  Pulse:   Temp:   Resp:     Intake/Output Summary (Last 24 hours) at 12/06/14 1223 Last data filed at 12/06/14 0800  Gross per 24 hour  Intake    600 ml  Output   2350 ml  Net  -1750 ml   Filed Weights   12/04/14 2330  Weight: 54.885 kg (121 lb)    Exam:   General:  Patient in no acute distress, alert and awake  Cardiovascular: Regular rate and rhythm, no murmurs rubs  Respiratory: Clear to auscultation bilaterally, no wheezes  Abdomen: Soft, nondistended  Musculoskeletal: Normal tone no clubbing on limited exam   Data  Reviewed: Basic Metabolic Panel:  Recent Labs Lab 12/05/14 0055 12/05/14 0531 12/06/14 0605  NA 137 137 137  K 3.9 4.2 4.5  CL 98 99 100  CO2 GLUCOSE 152* 148* 129*  BUN CREATININE 1.36* 1.44* 1.20*  CALCIUM 9.0 8.5 8.1*   Liver Function Tests:  Recent Labs Lab 12/05/14 0531  AST 22  ALT 12  ALKPHOS 69  BILITOT 0.6  PROT 6.1  ALBUMIN 3.5   No results for input(s): LIPASE, AMYLASE in the last 168 hours. No results for input(s): AMMONIA in the last 168 hours. CBC:  Recent Labs Lab 12/05/14 0055 12/05/14 0531 12/06/14 0605  WBC 13.9* 11.3* 8.5  NEUTROABS 11.3* 9.0*  --   HGB 11.0* 9.9* 8.8*  HCT 32.8* 30.0* 26.8*  MCV 93.7 95.8 94.7  PLT 316 311 241   Cardiac Enzymes: No results for input(s): CKTOTAL, CKMB, CKMBINDEX, TROPONINI in the last 168 hours. BNP (last 3 results)  Recent Labs  10/03/14 2205  BNP 89.5    ProBNP (last 3 results) No results for input(s): PROBNP in the last 8760 hours.  CBG:  Recent Labs Lab 12/05/14 1147 12/05/14 1637 12/06/14 0118 12/06/14 0643 12/06/14 1201  GLUCAP 97 102* 135* 126* 131*    Recent Results (from the past 240 hour(s))  Surgical pcr screen     Status: None   Collection Time: 12/05/14 12:33 PM  Result Value Ref Range Status   MRSA, PCR NEGATIVE NEGATIVE Final   Staphylococcus aureus NEGATIVE NEGATIVE Final  Comment:        The Xpert SA Assay (FDA approved for NASAL specimens in patients over 79 years of age), is one component of a comprehensive surveillance program.  Test performance has been validated by [facility] for patients greater than or equal to 79 year old. It is not intended to diagnose infection nor to guide or monitor treatment.      Studies: Dg Chest 1 View  12/05/2014   CLINICAL DATA:  Hip injury. History of coronary artery disease, hypertension and CHF.  EXAM: CHEST  1 VIEW  COMPARISON:  10/03/2014  FINDINGS: Unchanged cardiomediastinal contours allowing  for differences in technique. Hiatal hernia again seen. There is emphysema. Pulmonary vasculature is normal. No consolidation, pleural effusion, or pneumothorax. A skin fold projects over the left hemithorax. No acute osseous abnormalities are seen.  IMPRESSION: No acute process.   Electronically Signed   By: Rubye OaksMelanie  Ehinger M.D.   On: 12/05/2014 01:52   Pelvis Portable  12/06/2014   CLINICAL DATA:  Right hip hemiarthroplasty  EXAM: PORTABLE PELVIS 1-2 VIEWS  COMPARISON:  Pelvis x-ray from the earlier the same day  FINDINGS: Right hip hemiarthroplasty is located and intact. There is no evidence of periprosthetic fracture. Expected soft tissue gas about the right hip.  IMPRESSION: Unremarkable right hip hemiarthroplasty.   Electronically Signed   By: Marnee SpringJonathon  Watts M.D.   On: 12/06/2014 03:07   Dg Hip Unilat  With Pelvis 2-3 Views Right  12/05/2014   CLINICAL DATA:  Right hip injury coming out of the bathroom  EXAM: RIGHT HIP (WITH PELVIS) 2-3 VIEWS  COMPARISON:  None.  FINDINGS: Right femoral neck fracture, transcervical or subcapital. The fracture is displaced with femoral head rotation and varus angulation. The femoral head remains located.  No evidence of pelvic ring fracture or diastasis. The left hip is negative.  Osteopenia.  IMPRESSION: Displaced right femoral neck fracture.   Electronically Signed   By: Marnee SpringJonathon  Watts M.D.   On: 12/05/2014 00:51    Scheduled Meds: . clopidogrel  75 mg Oral Daily  . docusate sodium  100 mg Oral BID  . escitalopram  10 mg Oral Daily  . feeding supplement (ENSURE ENLIVE)  237 mL Oral BID BM  . ferrous sulfate  325 mg Oral Q breakfast  . furosemide  20 mg Oral BID  . isosorbide mononitrate  30 mg Oral Daily  . levETIRAcetam  750 mg Intravenous Q12H  . levETIRAcetam  750 mg Oral Q12H  . pravastatin  40 mg Oral q1800  . pregabalin  75 mg Oral QHS   Continuous Infusions: . sodium chloride 75 mL/hr at 12/05/14 2308     Time spent: > 35  minutes    Penny PiaVEGA, Davisha Linthicum  [facility] Pager [phone]. If 7PM-7AM, please contact night-coverage at [email or web address], password [email or web address] 12/06/2014, 12:23 PM  LOS: 1 day

## 2014-12-06 NOTE — Progress Notes (Addendum)
Clinical Social Work Department BRIEF PSYCHOSOCIAL ASSESSMENT 12/06/2014  Patient:  [name] [name],[name] [name]     Account Number:  [id number]     Admit date:  12/04/2014  Clinical Social Worker:  Leron CroakALLEN,Abigael Mogle, CLINICAL SOCIAL WORKER  Date/Time:  12/06/2014 05:26 PM  Referred by:  Physician  Date Referred:  12/06/2014 Referred for  SNF Placement   Other Referral:   Interview type:  Family Other interview type:   CSW spoke with Pt's daughter in law concerning d/c planning.    Dtr-in-law:  [name] SailorsAngela [name] [phone]    PSYCHOSOCIAL DATA Living Status:  OTHER Admitted from facility:   Level of care:   Primary support name:  [name] [name] [phone] Primary support relationship to patient:  FAMILY Degree of support available:   Mrs. [name] [name] has been Pt's support and helps make her decisions. Otherwise Pt has a very limited support system.    CURRENT CONCERNS Current Concerns  Post-Acute Placement   Other Concerns:    SOCIAL WORK ASSESSMENT / PLAN CSW received referral and reviewed chart. Pt not fully oriented. CSW then contact emergency contact to assess if there was a HCPOA and the daughter in law stated that she had not finished the paperwork but that she "has been making all of her health care decisions". CSW then explained reason for contact and referral. Pt's daughter in law stated that she is in agreement [place] [facility]. CSW will email that Pt's daughter in law with the facility information and also with bed offers.[facility]    Assessment/plan status:  [facility] Other assessment/ plan:   Information/referral to community resources:   CSW will email that Pt's daughter in law with the facility information and also with bed offers.    PATIENT'S/FAMILY'S RESPONSE TO PLAN OF CARE: Pt's daughter in law was appreciative for the contact and for assisting with d/c planning.  CSW will update [name] [name] with any changes.        Leron Croakassandra Hopie P[facility]  [facility]  2S, 22M, 5N, 6N, 6E, PEDS/PICU [phone]

## 2014-12-06 NOTE — Progress Notes (Addendum)
Clinical Social Work Department CLINICAL SOCIAL WORK PLACEMENT NOTE 12/06/2014  Patient:  [name] [name],[name] [name]  Account Number:  [id number] Admit date:  12/04/2014  Clinical Social Worker:  Leron CroakASSANDRA ALLEN, CLINICAL SOCIAL WORKER  Date/time:  12/06/2014 06:02 PM  Clinical Social Work is seeking post-discharge placement for this patient at the following level of care:   SKILLED NURSING   (*CSW will update this form in Epic as items are completed)   12/06/2014  Patient/family provided with [facility] of Clinical Social Work's list of facilities offering this level of care within the geographic area requested by the patient (or if unable, by the patient's family).  12/06/2014  Patient/family informed of their freedom to choose among providers that offer the needed level of care, that participate in Medicare, Medicaid or managed care program needed by the patient, have an available bed and are willing to accept the patient.  12/06/2014  Patient/family informed of [facility]' ownership interest in [facility], as well as of the fact that they are under no obligation to receive care at this facility.  PASARR submitted to EDS on 12/06/2014 PASARR number received on 12/06/2014  FL2 transmitted to all facilities in geographic area requested by pt/family on  12/06/2014 FL2 transmitted to all facilities within larger geographic area on   Patient informed that his/her managed care company has contracts with or will negotiate with  certain facilities, including the following:     Patient/family informed of bed offers received:  12/07/2014 [facility] Marcelline Deist(Carita Sollars, [place]) Physician recommends and patient chooses bed at    Patient [facility] be [facility] to  [facility] on  12/09/2014 Marcelline Deist(Vershawn Westrup, [facility]) Patient to be transferred to facility by [facility] Marcelline Deist(Raiana Pharris, LCSWA) Patient and family notified of transfer on 12/09/2014 Marcelline Deist(Allani Reber, Theresia MajorsLCSWA) Name of  family member notified:  Brooks SailorsAngela [facility](Marcelline DeistEmily Tenlee Wollin, Theresia MajorsLCSWA)  The following physician request were entered in Epic:   Additional Comments:  Cassandra Janelle FloorAllen LCSWA  [facility]  2S, 9M, 5N, 6N, 6E, PEDS/PICU [phone]:[phone]  Lily Kochermily Broedy Osbourne, LCSWA Cell: [phone]       Fax: [phone] Clinical Social Work: Orthopedics [phone]([id number]) and Surgical [phone]([id number])

## 2014-12-06 NOTE — Evaluation (Signed)
Occupational Therapy Evaluation and Discharge Patient Details Name: [name] [name] MRN: [id number] DOB: 28-Aug-1926 Today's Date: 12/06/2014    History of Present Illness 81 yo female admitted due to mechanical fall and femoral neck fracture with R hip replacement surgery 12/05/14.  Prior Med Hx: CAD, seizure disorder, CHF with EF 74%, CKD.   Clinical Impression   This 81 yo female admitted and underwent above presents to acute OT with decreased balance, decreased mobility, increased lethargy, increased pain, and posterior hip precautions all affecting her ability to care for herself at home. She will benefit from continued OT at SNF, we will sign off.    Follow Up Recommendations  SNF    Equipment Recommendations   (TBD at next venue)       Precautions / Restrictions Precautions Precautions: Posterior Hip;Fall Precaution Booklet Issued: Yes (comment) Restrictions Weight Bearing Restrictions: Yes RLE Weight Bearing: Weight bearing as tolerated      Mobility Bed Mobility Overal bed mobility: Needs Assistance Bed Mobility: Supine to Sit;Sit to Supine Rolling: Mod assist   Supine to sit: Max assist Sit to supine: Max assist   General bed mobility comments: Limit by pain and confusion  Transfers Overall transfer level: Needs assistance Equipment used: Rolling walker (2 wheeled) Transfers: Sit to/from [employer] Sit to Stand: Mod assist;From elevated surface Stand pivot transfers: Max assist       General transfer comment: Painful, not tolerating full weight R LE    Balance Overall balance assessment: Needs assistance Sitting-balance support: Bilateral upper extremity supported;Feet supported Sitting balance-Leahy Scale: Zero                                      ADL Overall ADL's : Needs assistance/impaired Eating/Feeding: Total assistance;Bed level (due to lethargy)   Grooming: Total assistance;Bed level (due to lethargy)   Upper  Body Bathing: Total assistance;Bed level (due to lethargy)   Lower Body Bathing: Total assistance;Bed level (due to lethargy)   Upper Body Dressing : Total assistance;Bed level (due to lethargy)   Lower Body Dressing: Total assistance;Bed level (due to lethargy)     Toilet Transfer Details (indicate cue type and reason): Unable to attempt too lethargic                 Vision Additional Comments: unable to assess too lethargic          Pertinent Vitals/Pain Pain Assessment: Faces Pain Score: 6  Faces Pain Scale: Hurts even more Pain Location: right hip with movement Pain Descriptors / Indicators: Tightness;Aching;Sore Pain Intervention(s): Monitored during session;Limited activity within patient's tolerance     Hand Dominance Right   Extremity/Trunk Assessment Upper Extremity Assessment Upper Extremity Assessment: LUE deficits/detail LUE Deficits / Details: Decreased AROM/PROM--pt states Yes to was her shoudler this way pta LUE Coordination: decreased gross motor   Lower Extremity Assessment Lower Extremity Assessment: RLE deficits/detail RLE Deficits / Details: Weakness and pain due to acute hip replacement surgery       Communication Communication Communication:  (lethargy)   Cognition Arousal/Alertness: Lethargic Behavior During Therapy: Flat affect Overall Cognitive Status: Impaired/Different from baseline (lethargic) Area of Impairment: Orientation;Problem solving Orientation Level: Person;Place           Problem Solving: Decreased initiation;Difficulty sequencing General Comments: Mild/moderate confusion possibly related to surgery      Exercises Exercises: Total Joint          Home  Living Family/patient expects to be discharged to:: Skilled nursing facility                                 Additional Comments: Likely SNF      Prior Functioning/Environment Level of Independence:  (Pt could not tell me--lethargic)              OT Diagnosis: Generalized weakness;Acute pain;Cognitive deficits   OT Problem List: Decreased strength;Decreased range of motion;Decreased activity tolerance;Impaired balance (sitting and/or standing);Pain;Decreased cognition;Decreased knowledge of use of DME or AE      OT Goals(Current goals can be found in the care plan section) Acute Rehab OT Goals Patient Stated Goal: to walk OT Goal Formulation: With patient Time For Goal Achievement: 12/13/14 Potential to Achieve Goals: Good  OT Frequency:                End of Session Nurse Communication:  (IV is complete)  Activity Tolerance: Patient limited by lethargy Patient left: in bed;with call bell/phone within reach;with bed alarm set   Time: 1444-1453 OT Time Calculation (min): 9 min Charges:  OT General Charges $OT Visit: 1 Procedure OT Evaluation $Initial OT Evaluation Tier I: 1 Procedure  Evette GeorgesLeonard, Abree Romick Eva [phone] 12/06/2014, 3:53 PM

## 2014-12-06 NOTE — Progress Notes (Signed)
Subjective: 1 Day Post-Op Procedure(s) (LRB): ARTHROPLASTY BIPOLAR HIP (Right)  Activity level:  wbat Diet tolerance:  ok Voiding:  ok Patient reports pain as mild.    Objective: Vital signs in last 24 hours: Temp:  [98.1 F (36.7 C)-100.7 F (38.2 C)] 100.7 F (38.2 C) (04/16 0502) Pulse Rate:  [84-131] 93 (04/16 0502) Resp:  [16-27] 18 (04/16 0502) BP: (116-145)/(34-89) 120/48 mmHg (04/16 0630) SpO2:  [92 %-100 %] 100 % (04/16 0502) FiO2 (%):  [32 %] 32 % (04/15 2258)  Labs:  Recent Labs  12/05/14 0055 12/05/14 0531 12/06/14 0605  HGB 11.0* 9.9* 8.8*    Recent Labs  12/05/14 0531 12/06/14 0605  WBC 11.3* 8.5  RBC 3.13* 2.83*  HCT 30.0* 26.8*  PLT 311 241    Recent Labs  12/05/14 0531 12/06/14 0605  NA 137 137  K 4.2 4.5  CL 99 100  CO2 28 25  BUN 20 17  CREATININE 1.44* 1.20*  GLUCOSE 148* 129*  CALCIUM 8.5 8.1*    Recent Labs  12/05/14 0055  INR 1.01    Physical Exam:  Neurologically intact ABD soft Neurovascular intact Sensation intact distally Intact pulses distally Dorsiflexion/Plantar flexion intact Incision: dressing C/D/I No cellulitis present Compartment soft  Assessment/Plan:  1 Day Post-Op Procedure(s) (LRB): ARTHROPLASTY BIPOLAR HIP (Right) Advance diet Up with therapy  Continue on ASA 325mg  for DVT prevention Continue current pain meds at this time but monitor closely for increasing confusion. We greatly appreciate medical teams management. Follow up in office 10 days.     Penn Grissett, Ginger OrganNDREW PAUL 12/06/2014, 8:46 AM

## 2014-12-07 LAB — GLUCOSE, CAPILLARY
GLUCOSE-CAPILLARY: 104 mg/dL — AB (ref 70–99)
GLUCOSE-CAPILLARY: 95 mg/dL (ref 70–99)
Glucose-Capillary: 116 mg/dL — ABNORMAL HIGH (ref 70–99)
Glucose-Capillary: 129 mg/dL — ABNORMAL HIGH (ref 70–99)

## 2014-12-07 LAB — CBC
HCT: 23.4 % — ABNORMAL LOW (ref 36.0–46.0)
Hemoglobin: 7.6 g/dL — ABNORMAL LOW (ref 12.0–15.0)
MCH: 31 pg (ref 26.0–34.0)
MCHC: 32.5 g/dL (ref 30.0–36.0)
MCV: 95.5 fL (ref 78.0–100.0)
Platelets: 191 10*3/uL (ref 150–400)
RBC: 2.45 MIL/uL — ABNORMAL LOW (ref 3.87–5.11)
RDW: 13.4 % (ref 11.5–15.5)
WBC: 7.9 10*3/uL (ref 4.0–10.5)

## 2014-12-07 LAB — BASIC METABOLIC PANEL
Anion gap: 8 (ref 5–15)
BUN: 10 mg/dL (ref 6–23)
CO2: 27 mmol/L (ref 19–32)
Calcium: 8.1 mg/dL — ABNORMAL LOW (ref 8.4–10.5)
Chloride: 102 mmol/L (ref 96–112)
Creatinine, Ser: 1.11 mg/dL — ABNORMAL HIGH (ref 0.50–1.10)
GFR calc Af Amer: 51 mL/min — ABNORMAL LOW (ref >90)
GFR, EST NON AFRICAN AMERICAN: 44 mL/min — AB (ref >90)
Glucose, Bld: 143 mg/dL — ABNORMAL HIGH (ref 70–99)
POTASSIUM: 4.2 mmol/L (ref 3.5–5.1)
Sodium: 137 mmol/L (ref 135–145)

## 2014-12-07 NOTE — Progress Notes (Signed)
   CSW emailed Pt's bed offers to Dtr in [place] for review and bed choice.      Leron Croakassandra Seraj Dunnam [facility]  [facility]  2S, 50M, 5N, 6N, 6E, PEDS/PICU [phone]:[phone]

## 2014-12-07 NOTE — Progress Notes (Signed)
Subjective: 2 Days Post-Op Procedure(s) (LRB): ARTHROPLASTY BIPOLAR HIP (Right)   Patient asleep in bed but was arouseable. She wanted to sit up and eat. She is still al little sleepy.  Activity level:  wbat Diet tolerance:  ok Voiding:  Foley in place Patient reports pain as mild and moderate.    Objective: Vital signs in last 24 hours: Temp:  [98 F (36.7 C)-100.3 F (37.9 C)] 98 F (36.7 C) (04/17 0601) Pulse Rate:  [80-98] 80 (04/17 0601) Resp:  [16-18] 17 (04/17 0601) BP: (91-115)/(38-53) 91/38 mmHg (04/17 0601) SpO2:  [97 %-100 %] 100 % (04/17 0601)  Labs:  Recent Labs  12/05/14 0055 12/05/14 0531 12/06/14 0605 12/07/14 0512  HGB 11.0* 9.9* 8.8* 7.6*    Recent Labs  12/06/14 0605 12/07/14 0512  WBC 8.5 7.9  RBC 2.83* 2.45*  HCT 26.8* 23.4*  PLT 241 191    Recent Labs  12/06/14 0605 12/07/14 0512  NA 137 137  K 4.5 4.2  CL 100 102  CO2 25 27  BUN 17 10  CREATININE 1.20* 1.11*  GLUCOSE 129* 143*  CALCIUM 8.1* 8.1*    Recent Labs  12/05/14 0055  INR 1.01    Physical Exam:  Neurologically intact ABD soft Neurovascular intact Sensation intact distally Intact pulses distally Dorsiflexion/Plantar flexion intact Incision: dressing C/D/I and no drainage No cellulitis present Compartment soft  Assessment/Plan:  2 Days Post-Op Procedure(s) (LRB): ARTHROPLASTY BIPOLAR HIP (Right) Advance diet Up with therapy Discharge to SNF once cleared by the medical team. We will try to switch her to tramadol for pain. We will keep hydrocodone available increase pain becomes unbareable but hopefully she will not need it.  Continue on ASA 325mg  for DVT prevention Follow up in office 10 days We greatly appreciate medical management.       Jeffie Widdowson, Ginger OrganNDREW PAUL 12/07/2014, 8:36 AM

## 2014-12-07 NOTE — Progress Notes (Signed)
Physical Therapy Treatment Patient Details Name: [name] [name] G Gaige MRN: [id number] DOB: 04-28-1927 Today's Date: 12/07/2014    History of Present Illness 81 yo female admitted due to mechanical fall and femoral neck fracture with R hip replacement surgery 12/05/14.  Prior Med Hx: CAD, seizure disorder, CHF with EF 74%, CKD.    PT Comments    Significant progress, especially with tolerance of standing and able to take steps; Max encouragement for pt to remain in chair -- educated pt in the benefits of spending time OOB; Needs continuing reinforcement in posterior hip prec and benefits of being OOB  Follow Up Recommendations  SNF;Supervision/Assistance - 24 hour     Equipment Recommendations  None recommended by PT (has cane and walker at home)    Recommendations for Other Services       Precautions / Restrictions Precautions Precautions: Posterior Hip;Fall Restrictions RLE Weight Bearing: Weight bearing as tolerated    Mobility  Bed Mobility Overal bed mobility: Needs Assistance Bed Mobility: Supine to Sit     Supine to sit: Mod assist     General bed mobility comments: simple, step-by-step cues for technqiue; good use of LLE to half-bridge to EOB with close guard for post hip prec RLE; mod assist to elevate trunk to transition to sitting  Transfers Overall transfer level: Needs assistance Equipment used: Rolling walker (2 wheeled) Transfers: Sit to/from Stand Sit to Stand: +2 safety/equipment;Mod assist         General transfer comment: Pt stood from bed somewhat impulsively; light mod assist to steady and help translate center of mass over feet (noted heavy posterior lean  Ambulation/Gait Ambulation/Gait assistance: +2 safety/equipment;Mod assist Ambulation Distance (Feet): 5 Feet Assistive device: Rolling walker (2 wheeled) Gait Pattern/deviations: Step-to pattern Gait velocity: slow   General Gait Details: Step-by-step cues for gait sequence, max encouragement to  keep going; cues also to bear down into RW to take weight off of painful RLE and allow for LLE steps   Stairs            Wheelchair Mobility    Modified Rankin (Stroke Patients Only)       Balance Overall balance assessment: Needs assistance Sitting-balance support: Bilateral upper extremity supported Sitting balance-Leahy Scale: Poor       Standing balance-Leahy Scale: Poor                      Cognition Arousal/Alertness: Awake/alert Behavior During Therapy: WFL for tasks assessed/performed Overall Cognitive Status: Within Functional Limits for tasks assessed (for simple mobility tasks)                      Exercises Total Joint Exercises Heel Slides: AAROM;Right;10 reps;Supine Hip ABduction/ADduction: AAROM;Right;5 reps;Supine    General Comments        Pertinent Vitals/Pain Pain Assessment: Faces Faces Pain Scale: Hurts even more Pain Location: R hip with movement and weight bearing Pain Descriptors / Indicators: Grimacing Pain Intervention(s): Limited activity within patient's tolerance;Monitored during session;Repositioned    Home Living                      Prior Function            PT Goals (current goals can now be found in the care plan section) Acute Rehab PT Goals Patient Stated Goal: to walk PT Goal Formulation: With patient Time For Goal Achievement: 12/20/14 Potential to Achieve Goals: Good Progress towards PT goals: Progressing toward goals  Frequency  Min 5X/week    PT Plan Current plan remains appropriate;Frequency needs to be updated (noted dc plan of SNF)    Co-evaluation             End of Session Equipment Utilized During Treatment: Gait belt Activity Tolerance: Patient tolerated treatment well;Patient limited by pain Patient left: in chair;with call bell/phone within reach (camera room)     Time: 2952-8413 PT Time Calculation (min) (ACUTE ONLY): 26 min  Charges:  $Gait Training:  8-22 mins $Therapeutic Activity: 8-22 mins                    G Codes:      Olen Pel 12/07/2014, 11:42 AM  Van Clines, PT  Acute Rehabilitation Services Pager [phone] Office [phone]

## 2014-12-07 NOTE — Progress Notes (Signed)
[facility] PROGRESS NOTE  [name] [name] G Dake VWU:[id number] DOB: [birth year] DOA: 12/04/2014 PCP: Eartha InchBADGER,MICHAEL C, MD  Assessment/Plan: Principal Problem:    Fracture of femoral neck, right - Ortho Evra on board and assisting with management - Patient is status post 2 day postop arthroplasty Bipolar hip (right) - Continue supportive therapy - Physical therapy evaluation recommending SNF on d/c  Active Problems:   Seizure - Table no breakthrough seizures reported, continue current regimen    Hypertension - Patient is currently on Imdur  Fever - Postop day 1 may be secondary to atelectasis - No fevers within the last 24 hours    Hyperlipidemia - Stable patient on Plavix and statin    CKD (chronic kidney disease) stage 3, GFR 30-59 ml/min - Stable    Malnutrition of moderate degree -Agree with registered dietitian's findings. Please refer to their note on 12/05/2014. Continue ensure  Code Status: Full Family Communication: No family at bedside Disposition Plan: Pending improvement in condition   Consultants:  Orthopedic  Procedures:  None  Antibiotics:  Ancef  HPI/Subjective: No new complaints.  Objective: Filed Vitals:   12/07/14 1331  BP: 113/48  Pulse: 95  Temp: 98.9 F (37.2 C)  Resp: 18    Intake/Output Summary (Last 24 hours) at 12/07/14 1650 Last data filed at 12/07/14 1300  Gross per 24 hour  Intake   2345 ml  Output    800 ml  Net   1545 ml   Filed Weights   12/04/14 2330  Weight: 54.885 kg (121 lb)    Exam:   General:  Patient in no acute distress, alert and awake  Cardiovascular: Regular rate and rhythm, no murmurs rubs  Respiratory: Clear to auscultation bilaterally, no wheezes  Abdomen: Soft, nondistended  Musculoskeletal: Normal tone no clubbing on limited exam   Data Reviewed: Basic Metabolic Panel:  Recent Labs Lab 12/05/14 0055 12/05/14 0531 12/06/14 0605 12/07/14 0512  NA 137 137 137 137  K 3.9 4.2 4.5  4.2  CL 98 99 100 102  CO2 25 28 25 27   GLUCOSE 152* 148* 129* 143*  BUN 18 20 17 10   CREATININE 1.36* 1.44* 1.20* 1.11*  CALCIUM 9.0 8.5 8.1* 8.1*   Liver Function Tests:  Recent Labs Lab 12/05/14 0531  AST 22  ALT 12  ALKPHOS 69  BILITOT 0.6  PROT 6.1  ALBUMIN 3.5   No results for input(s): LIPASE, AMYLASE in the last 168 hours. No results for input(s): AMMONIA in the last 168 hours. CBC:  Recent Labs Lab 12/05/14 0055 12/05/14 0531 12/06/14 0605 12/07/14 0512  WBC 13.9* 11.3* 8.5 7.9  NEUTROABS 11.3* 9.0*  --   --   HGB 11.0* 9.9* 8.8* 7.6*  HCT 32.8* 30.0* 26.8* 23.4*  MCV 93.7 95.8 94.7 95.5  PLT 316 311 241 191   Cardiac Enzymes: No results for input(s): CKTOTAL, CKMB, CKMBINDEX, TROPONINI in the last 168 hours. BNP (last 3 results)  Recent Labs  10/03/14 2205  BNP 89.5    ProBNP (last 3 results) No results for input(s): PROBNP in the last 8760 hours.  CBG:  Recent Labs Lab 12/06/14 1557 12/06/14 2049 12/07/14 0559 12/07/14 1217 12/07/14 1630  GLUCAP 210* 134* 116* 104* 129*    Recent Results (from the past 240 hour(s))  Urine culture     Status: None   Collection Time: 12/05/14  6:01 AM  Result Value Ref Range Status   Specimen Description URINE, CATHETERIZED  Final   Special Requests NONE  Final   Colony Count NO GROWTH Performed at [employer] [facility]   Final   Culture NO GROWTH Performed at [employer] [facility]   Final   Report Status 12/06/2014 FINAL  Final  Surgical pcr screen     Status: None   Collection Time: 12/05/14 12:33 PM  Result Value Ref Range Status   MRSA, PCR NEGATIVE NEGATIVE Final   Staphylococcus aureus NEGATIVE NEGATIVE Final    Comment:        The Xpert SA Assay (FDA approved for NASAL specimens in patients over 68 years of age), is one component of a comprehensive surveillance program.  Test performance has been validated by [facility] for patients greater than or equal to 34 year old. It is not  intended to diagnose infection nor to guide or monitor treatment.      Studies: Pelvis Portable  12/06/2014   CLINICAL DATA:  Right hip hemiarthroplasty  EXAM: PORTABLE PELVIS 1-2 VIEWS  COMPARISON:  Pelvis x-ray from the earlier the same day  FINDINGS: Right hip hemiarthroplasty is located and intact. There is no evidence of periprosthetic fracture. Expected soft tissue gas about the right hip.  IMPRESSION: Unremarkable right hip hemiarthroplasty.   Electronically Signed   By: Marnee Spring M.D.   On: 12/06/2014 03:07    Scheduled Meds: . clopidogrel  75 mg Oral Daily  . docusate sodium  100 mg Oral BID  . escitalopram  10 mg Oral Daily  . feeding supplement (ENSURE ENLIVE)  237 mL Oral BID BM  . ferrous sulfate  325 mg Oral Q breakfast  . furosemide  20 mg Oral BID  . isosorbide mononitrate  30 mg Oral Daily  . levETIRAcetam  750 mg Oral Q12H  . pravastatin  40 mg Oral q1800  . pregabalin  75 mg Oral QHS   Continuous Infusions: . sodium chloride 75 mL/hr at 12/07/14 1442     Time spent: > 35 minutes    Penny Pia  [facility] Pager [phone]. If 7PM-7AM, please contact night-coverage at [email or web address], password [facility] 12/07/2014, 4:50 PM  LOS: 2 days

## 2014-12-07 NOTE — Brief Op Note (Signed)
12/04/2014 - 12/05/2014  10:59 AM  PATIENT:  [name] [name]  81 y.o. female  PRE-OPERATIVE DIAGNOSIS:  right fem neck fracture  POST-OPERATIVE DIAGNOSIS:  right fem neck fracture  PROCEDURE:  Procedure(s): ARTHROPLASTY BIPOLAR HIP (Right)  SURGEON:  Surgeon(s) and Role:    * Jodi GeraldsJohn Avary Pitsenbarger, MD - Primary  PHYSICIAN ASSISTANT:   ASSISTANTS: bethune   ANESTHESIA:   general  EBL:  Total I/O In: 240 [P.O.:240] Out: -   BLOOD ADMINISTERED:none  DRAINS: none   LOCAL MEDICATIONS USED:  MARCAINE     SPECIMEN:  No Specimen  DISPOSITION OF SPECIMEN:  N/A  COUNTS:  YES  TOURNIQUET:  * No tourniquets in log *  DICTATION: .Other Dictation: Dictation Number [id number]  PLAN OF CARE: Admit to inpatient   PATIENT DISPOSITION:  PACU - hemodynamically stable.   Delay start of Pharmacological VTE agent (>24hrs) due to surgical blood loss or risk of bleeding: no

## 2014-12-07 NOTE — Op Note (Signed)
[name], [name] [name]                  ACCOUNT NO.:  [id number]  MEDICAL RECORD NO.:  [id number]  LOCATION:  5N32C                        FACILITY:  [facility]  PHYSICIAN:  Harvie JuniorJohn L. Armanda Forand, M.D.   DATE OF BIRTH:  10-Nov-1926  DATE OF PROCEDURE:  12/05/2014 DATE OF DISCHARGE:                              OPERATIVE REPORT   PREOPERATIVE DIAGNOSIS:  Femoral neck fracture, right.  POSTOPERATIVE DIAGNOSIS:  Femoral neck fracture, right.  PROCEDURE:  Hemiarthroplasty, right hip with Summit basic stem, size 5 with a 45 mm +0 hip ball.  SURGEON:  Harvie JuniorJohn L. Reda Citron, MD.  ASSISTANT:  Marshia LyJames Bethune, PA.  ANESTHESIA:  General.  BRIEF HISTORY:  [name] [name] is an 81 year old female with a long history of significant complaints of right hip pain after a fall.  X-rays showed a femoral neck fracture displaced.  She was admitted to the Hospitalist Service and cleared.  She was taken to the operating room for right hemiarthroplasty.  DESCRIPTION OF PROCEDURE:  The patient was taken to the operating room. After adequate anesthesia was obtained with general anesthetic, the patient was placed supine on the operating table.  She was moved in the left lateral decubitus position.  All bony prominences were well padded. Attention was then turned to the right hip where after prep and drape, an incision was made for posterior approach to the hip, subcutaneous tissues down to the level of the tensor fascia which was divided in line with its fibers.  Gluteus medius fascia was released on the top, muscle finger fractured __________ fractured.  Charnley retractor was put in place.  Piriformis was identified.  Retractors were placed above and below the femoral head.  Piriformis, short external rotators were taken down and tagged as well as the posterior capsule.  Once this was done, a provisional neck cut was made and then the head was removed, incised on the back table to 45.  The acetabulum was then inspected and noted  to be within normal limits.  It was irrigated.  Attention was then turned to the femoral side where a cookie cutter was used, followed by a lateralizing canal finder.  The hip was then sequentially rasped to a level of a size 5.  Excellent fit and stability was achieved with the 5, a press-fit 5 implant was then opened and a +0 ball placed, after this had been trialed on the final implant.  Excellent range of motion and stability were achieved at this point.  Short external rotators and piriformis were repaired to the posterior intertrochanteric line through drill holes.  The wound was again irrigated, suctioned dry.  Tensor fascia was closed with running Vicryl, skin with 0 Vicryl and 2-0 Vicryl and skin staples.  Sterile compressive dressing was applied.  Then, the patient was taken to the recovery room and was noted to be in satisfactory condition.  Estimated blood loss for the procedure was amenable.     Harvie JuniorJohn L. Sarye Kath, M.D.     Ranae PlumberJLG/MEDQ  D:  12/07/2014  T:  12/07/2014  Job:  [id number]

## 2014-12-08 ENCOUNTER — Encounter (HOSPITAL_COMMUNITY): Payer: Self-pay | Admitting: Orthopedic Surgery

## 2014-12-08 DIAGNOSIS — D62 Acute posthemorrhagic anemia: Secondary | ICD-10-CM

## 2014-12-08 LAB — BASIC METABOLIC PANEL
ANION GAP: 7 (ref 5–15)
BUN: 9 mg/dL (ref 6–23)
CO2: 28 mmol/L (ref 19–32)
CREATININE: 0.99 mg/dL (ref 0.50–1.10)
Calcium: 7.8 mg/dL — ABNORMAL LOW (ref 8.4–10.5)
Chloride: 103 mmol/L (ref 96–112)
GFR calc Af Amer: 59 mL/min — ABNORMAL LOW (ref >90)
GFR calc non Af Amer: 51 mL/min — ABNORMAL LOW (ref >90)
Glucose, Bld: 102 mg/dL — ABNORMAL HIGH (ref 70–99)
Potassium: 3.9 mmol/L (ref 3.5–5.1)
Sodium: 138 mmol/L (ref 135–145)

## 2014-12-08 LAB — CBC
HCT: 21.2 % — ABNORMAL LOW (ref 36.0–46.0)
Hemoglobin: 6.9 g/dL — CL (ref 12.0–15.0)
MCH: 30.8 pg (ref 26.0–34.0)
MCHC: 32.5 g/dL (ref 30.0–36.0)
MCV: 94.6 fL (ref 78.0–100.0)
Platelets: 196 10*3/uL (ref 150–400)
RBC: 2.24 MIL/uL — AB (ref 3.87–5.11)
RDW: 13.3 % (ref 11.5–15.5)
WBC: 7.5 10*3/uL (ref 4.0–10.5)

## 2014-12-08 LAB — GLUCOSE, CAPILLARY
GLUCOSE-CAPILLARY: 93 mg/dL (ref 70–99)
GLUCOSE-CAPILLARY: 112 mg/dL — AB (ref 70–99)
Glucose-Capillary: 141 mg/dL — ABNORMAL HIGH (ref 70–99)
Glucose-Capillary: 99 mg/dL (ref 70–99)

## 2014-12-08 LAB — PREPARE RBC (CROSSMATCH)

## 2014-12-08 MED ORDER — FUROSEMIDE 10 MG/ML IJ SOLN
10.0000 mg | Freq: Once | INTRAMUSCULAR | Status: AC
  Administered 2014-12-08: 10 mg via INTRAVENOUS
  Filled 2014-12-08: qty 2

## 2014-12-08 MED ORDER — SODIUM CHLORIDE 0.9 % IV SOLN: Freq: Once | INTRAVENOUS | Status: DC

## 2014-12-08 NOTE — Clinical Documentation Improvement (Signed)
Presents with right femoral neck fracture; hemiarthroplasty performed. Has CKD3 and moderate malnutrition.   Patient with abnormal lab values- Creatinine was 1.44 on 4/15 and has decreased to 1.11 on 12/07/14  A 0.33 drop in 48 hours  Being hydrated with 0.9% NaCl at 100cc/hr  Please provide a diagnosis associated with the above clinical indicators and document findings in next progress note and include in discharge summary if applicable.  Acute Renal Failure/Acute Kidney Injury Acute on Chronic Renal Failure Chronic Renal Failure Other Condition Cannot Clinically Determine   Thank You, Shellee MiloEileen T Yarelis Ambrosino ,RN Clinical Documentation Specialist:  [phone]  [facility]- Health Information Management

## 2014-12-08 NOTE — Progress Notes (Signed)
[facility] PROGRESS NOTE  [name] [name] XBM:[id number] DOB: 21-May-1927 DOA: 12/04/2014 PCP: [name] Inch, MD  Assessment/Plan: Principal Problem:    Fracture of femoral neck, right - Ortho board and assisting with management - Patient is status post 3 day postop arthroplasty Bipolar hip (right) - Continue supportive therapy - Physical therapy evaluation recommending SNF on d/c (most likely dc next am)  Active Problems:   Seizure - Table no breakthrough seizures reported, continue current regimen    Hypertension - Patient is currently on Imdur  Fever - Postop day 1 may be secondary to atelectasis - No fevers within the last 24 hours    Hyperlipidemia - Stable patient on Plavix and statin    CKD (chronic kidney disease) stage 3, GFR 30-59 ml/min - Stable    Malnutrition of moderate degree -Agree with registered dietitian's findings. Please refer to their note on 12/05/2014.  - Continue ensure  Code Status: Full Family Communication: No family at bedside Disposition Plan: SNF most likely next am.   Consultants:  Orthopedic  Procedures:  None  Antibiotics:  Ancef  HPI/Subjective: No new complaints. No acute issues reported overnight.  Objective: Filed Vitals:   12/08/14 1125  BP: 104/37  Pulse: 87  Temp: 98.1 F (36.7 C)  Resp: 16    Intake/Output Summary (Last 24 hours) at 12/08/14 1616 Last data filed at 12/08/14 1500  Gross per 24 hour  Intake   1340 ml  Output    975 ml  Net    365 ml   Filed Weights   12/04/14 2330  Weight: 54.885 kg (121 lb)    Exam:   General:  Patient in no acute distress, alert and awake  Cardiovascular: Regular rate and rhythm, no murmurs rubs  Respiratory: Clear to auscultation bilaterally, no wheezes  Abdomen: Soft, nondistended  Musculoskeletal: Normal tone no clubbing on limited exam   Data Reviewed: Basic Metabolic Panel:  Recent Labs Lab 12/05/14 0055 12/05/14 0531 12/06/14 0605  12/07/14 0512 12/08/14 0548  NA 137 137 137 137 138  K 3.9 4.2 4.5 4.2 3.9  CL 98 99 100 102 103  CO2 GLUCOSE 152* 148* 129* 143* 102*  BUN CREATININE 1.36* 1.44* 1.20* 1.11* 0.99  CALCIUM 9.0 8.5 8.1* 8.1* 7.8*   Liver Function Tests:  Recent Labs Lab 12/05/14 0531  AST 22  ALT 12  ALKPHOS 69  BILITOT 0.6  PROT 6.1  ALBUMIN 3.5   No results for input(s): LIPASE, AMYLASE in the last 168 hours. No results for input(s): AMMONIA in the last 168 hours. CBC:  Recent Labs Lab 12/05/14 0055 12/05/14 0531 12/06/14 0605 12/07/14 0512 12/08/14 0548  WBC 13.9* 11.3* 8.5 7.9 7.5  NEUTROABS 11.3* 9.0*  --   --   --   HGB 11.0* 9.9* 8.8* 7.6* 6.9*  HCT 32.8* 30.0* 26.8* 23.4* 21.2*  MCV 93.7 95.8 94.7 95.5 94.6  PLT 316 311 241 191 196   Cardiac Enzymes: No results for input(s): CKTOTAL, CKMB, CKMBINDEX, TROPONINI in the last 168 hours. BNP (last 3 results)  Recent Labs  10/03/14 2205  BNP 89.5    ProBNP (last 3 results) No results for input(s): PROBNP in the last 8760 hours.  CBG:  Recent Labs Lab 12/07/14 1217 12/07/14 1630 12/07/14 2143 12/08/14 0611 12/08/14 1124  GLUCAP 104* 129* 95 93 141*    Recent Results (from the past 240 hour(s))  Urine culture  Status: None   Collection Time: 12/05/14  6:01 AM  Result Value Ref Range Status   Specimen Description URINE, CATHETERIZED  Final   Special Requests NONE  Final   Colony Count NO GROWTH Performed at [facility]   Final   Culture NO GROWTH Performed at [facility]   Final   Report Status 12/06/2014 FINAL  Final  Surgical pcr screen     Status: None   Collection Time: 12/05/14 12:33 PM  Result Value Ref Range Status   MRSA, PCR NEGATIVE NEGATIVE Final   Staphylococcus aureus NEGATIVE NEGATIVE Final    Comment:        The Xpert SA Assay (FDA approved for NASAL specimens in patients over 79 years of age), is one component of a comprehensive  surveillance program.  Test performance has been validated by [facility] for patients greater than or equal to 79 year old. It is not intended to diagnose infection nor to guide or monitor treatment.      Studies: No results found.  Scheduled Meds: . sodium chloride   Intravenous Once  . clopidogrel  75 mg Oral Daily  . docusate sodium  100 mg Oral BID  . escitalopram  10 mg Oral Daily  . feeding supplement (ENSURE ENLIVE)  237 mL Oral BID BM  . ferrous sulfate  325 mg Oral Q breakfast  . furosemide  10 mg Intravenous Once  . furosemide  20 mg Oral BID  . isosorbide mononitrate  30 mg Oral Daily  . levETIRAcetam  750 mg Oral Q12H  . pravastatin  40 mg Oral q1800  . pregabalin  75 mg Oral QHS   Continuous Infusions: . sodium chloride 75 mL/hr at 12/08/14 0600     Time spent: > 35 minutes    [name] [name]  [facility] Pager [phone]. If 7PM-7AM, please contact night-coverage at [email or web address], password [email or web address] 12/08/2014, 4:16 PM  LOS: 3 days

## 2014-12-08 NOTE — Progress Notes (Signed)
Lab notified that pt had hgb of 6.9. Dr Cena BentonVega notified. Pt is asymptomatic. VS stable. Pulse 85. Sats 97% on 2 L.

## 2014-12-08 NOTE — Care Management Note (Signed)
CARE MANAGEMENT NOTE 12/08/2014  Patient:  [name] [name],[name]   Account Number:  [id number]  Date Initiated:  12/05/2014  Documentation initiated by:  [facility]RI[facility],[name]  Subjective/Objective Assessment:   rt hip fracture  s/p right hip hemiarthroplasty.     Action/Plan:   PT/OT evals after surgery  Patient will need shortterm rehab, social worker is aware. Will need transfusion for hgb 6.9 today. Will d/c on 12/09/14   Anticipated DC Date:  12/09/2014   Anticipated DC Plan:  SKILLED NURSING FACILITY  In-house referral  Clinical Social Worker      DC Planning Services  CM consult      [facility]  NA   Choice offered to / List presented to:     DME arranged  NA        HH arranged  NA      Status of service:  Completed, signed off Medicare Important Message given?  YES (If response is "NO", the following Medicare IM given date fields will be blank) Date Medicare IM given:  12/08/2014 Medicare IM given by:   Date Additional Medicare IM given:   Additional Medicare IM given by:    Discharge Disposition:  SKILLED NURSING FACILITY  Per UR Regulation:  Reviewed for med. necessity/level of care/duration of stay

## 2014-12-08 NOTE — Progress Notes (Signed)
Subjective: 3 Days Post-Op Procedure(s) (LRB): ARTHROPLASTY BIPOLAR HIP (Right) Patient reports pain as mild.  No complaints. Question mild dizziness.  Objective: Vital signs in last 24 hours: Temp:  [98.5 F (36.9 C)-99 F (37.2 C)] 98.5 F (36.9 C) (04/18 [id number]) Pulse Rate:  [78-95] 78 (04/18 0614) Resp:  [16-18] 16 (04/18 0614) BP: (107-113)/(44-57) 107/57 mmHg (04/18 0614) SpO2:  [99 %-100 %] 100 % (04/18 0614)  Intake/Output from previous day: 04/17 0701 - 04/18 0700 In: 1580 [P.O.:680; I.V.:900] Out: 625 [Urine:625] Intake/Output this shift:     Recent Labs  12/06/14 0605 12/07/14 0512 12/08/14 0548  HGB 8.8* 7.6* 6.9*    Recent Labs  12/07/14 0512 12/08/14 0548  WBC 7.9 7.5  RBC 2.45* 2.24*  HCT 23.4* 21.2*  PLT 191 196    Recent Labs  12/07/14 0512 12/08/14 0548  NA 137 138  K 4.2 3.9  CL 102 103  CO2 27 28  BUN 10 9  CREATININE 1.11* 0.99  GLUCOSE 143* 102*  CALCIUM 8.1* 7.8*   No results for input(s): LABPT, INR in the last 72 hours. Right hip exam: Neurovascular intact Sensation intact distally Intact pulses distally Dorsiflexion/Plantar flexion intact Incision: dressing C/D/I Compartment soft  Assessment/Plan: 3 Days Post-Op Procedure(s) (LRB): ARTHROPLASTY BIPOLAR HIP (Right)  Acute blood loss anemia. Plan: Will transfuse 2 units packed RBCs today. Aspirin enteric-coated 325 mg along with SCDs for DVT prophylaxis. Up with therapy  WBAT on right with posterior hip precautions. Will need SNF.possibly tomorrow.  Sten Dematteo G 12/08/2014, 10:00 AM

## 2014-12-08 NOTE — Progress Notes (Signed)
Physical Therapy Treatment Patient Details Name: [name] [name] MRN: [id number] DOB: 1927-04-01 Today's Date: 12/08/2014    History of Present Illness 81 yo female admitted due to mechanical fall and femoral neck fracture with R hip replacement surgery 12/05/14.  Prior Med Hx: CAD, seizure disorder, CHF with EF 74%, CKD.    PT Comments    Pt is progressing towards goals, but is still limited due to decreased mobility. Pt unable to recall 3/3 hip precautions and difficulty understanding exercise instructions. However, pt was very pleasant and eager to get OOB and sit in chair. She would benefit from continued PT to increase functional independence. Continue with POC.  Follow Up Recommendations  SNF;Supervision/Assistance - 24 hour     Equipment Recommendations  None recommended by PT    Recommendations for Other Services       Precautions / Restrictions Precautions Precautions: Posterior Hip;Fall Restrictions RLE Weight Bearing: Weight bearing as tolerated    Mobility  Bed Mobility Overal bed mobility: Needs Assistance Bed Mobility: Supine to Sit     Supine to sit: Mod assist     General bed mobility comments: Increased time, but able to use rails and UE to push up to upright postion with Mod A to support trunk and RLE.  Transfers Overall transfer level: Needs assistance Equipment used: Rolling walker (2 wheeled) Transfers: Stand Pivot Transfers;Sit to/from Stand Sit to Stand: Mod assist;+2 safety/equipment Stand pivot transfers: Max assist;+2 safety/equipment       General transfer comment: Pt required Mod A/cues for hand placement for balance with sit to stand and Max A/cues for SPT. +2 for safety.  Ambulation/Gait                 Stairs            Wheelchair Mobility    Modified Rankin (Stroke Patients Only)       Balance                                    Cognition Arousal/Alertness: Awake/alert Behavior During Therapy: WFL  for tasks assessed/performed Overall Cognitive Status: Within Functional Limits for tasks assessed       Memory: Decreased recall of precautions         General Comments: Difficulty recalling 3/3 hip precautions.    Exercises Total Joint Exercises Quad Sets: AROM;Right;5 reps Heel Slides: AAROM;Right;10 reps Hip ABduction/ADduction: AAROM;Right;5 reps    General Comments        Pertinent Vitals/Pain Pain Assessment: 0-10 Pain Score: 7  Pain Location: R hip Pain Descriptors / Indicators: Grimacing;Discomfort Pain Intervention(s): Limited activity within patient's tolerance;Monitored during session;Repositioned    Home Living                      Prior Function            PT Goals (current goals can now be found in the care plan section) Progress towards PT goals: Progressing toward goals    Frequency  Min 5X/week    PT Plan Current plan remains appropriate    Co-evaluation             End of Session Equipment Utilized During Treatment: Gait belt Activity Tolerance: Patient tolerated treatment well;Patient limited by fatigue Patient left: in chair;with call bell/phone within reach     Time: [id number] PT Time Calculation (min) (ACUTE ONLY): 24 min  Charges:  G CodesLeonard Schwartz, SPTA 12/08/2014, 10:10 AM

## 2014-12-09 DIAGNOSIS — D62 Acute posthemorrhagic anemia: Secondary | ICD-10-CM

## 2014-12-09 LAB — CBC
HCT: 29.3 % — ABNORMAL LOW (ref 36.0–46.0)
Hemoglobin: 10 g/dL — ABNORMAL LOW (ref 12.0–15.0)
MCH: 30.8 pg (ref 26.0–34.0)
MCHC: 34.1 g/dL (ref 30.0–36.0)
MCV: 90.2 fL (ref 78.0–100.0)
Platelets: 202 10*3/uL (ref 150–400)
RBC: 3.25 MIL/uL — ABNORMAL LOW (ref 3.87–5.11)
RDW: 13.7 % (ref 11.5–15.5)
WBC: 8.4 10*3/uL (ref 4.0–10.5)

## 2014-12-09 LAB — TYPE AND SCREEN
ABO/RH(D): O POS
Antibody Screen: NEGATIVE
UNIT DIVISION: 0
Unit division: 0

## 2014-12-09 LAB — GLUCOSE, CAPILLARY
Glucose-Capillary: 118 mg/dL — ABNORMAL HIGH (ref 70–99)
Glucose-Capillary: 172 mg/dL — ABNORMAL HIGH (ref 70–99)

## 2014-12-09 MED ORDER — FERROUS SULFATE 325 (65 FE) MG PO TABS: 325.0000 mg | ORAL_TABLET | Freq: Every day | ORAL | Status: DC

## 2014-12-09 NOTE — Progress Notes (Signed)
PT Cancellation Note  Patient Details Name: [name] [name] MRN: [id number] DOB: July 26, 1927   Cancelled Treatment:    Reason Eval/Treat Not Completed: Pain limiting ability to participate. Patient adamently refusing therapy this AM. Patient yelling and pointing "GET OUT NOW".    [name] [name], [name] [name] 12/09/2014, 11:05 AM

## 2014-12-09 NOTE — Discharge Planning (Signed)
[facility]  Patient's daughter, Brooks Sailorsngela Thobe, updated via phone.  [facility] Report number: [phone] Transportation: EMS ([facility])  Marcelline DeistEmily Breyanna Valera, [place] Cell: [phone]       Fax: [phone] Clinical Social Work: Orthopedics [phone]([id number]) and Surgical [phone]([id number])

## 2014-12-09 NOTE — Discharge Summary (Signed)
Physician Discharge Summary  [name] Koepke BJY:[id number]:[id number] DOB: [birth year] DOA: 12/04/2014  PCP: Eartha InchBADGER,MICHAEL C, MD  Admit date: 12/04/2014 Discharge date: 12/09/2014  Time spent: > 35 minutes  Recommendations for Outpatient Follow-up:  1. Please reassess CBC levels 2. On discharge minimized pain medicine to Tylenol  Discharge Diagnoses:  Principal Problem:   Fracture of femoral neck, right Active Problems:   Seizure   Hypertension   Hyperlipidemia   CKD (chronic kidney disease) stage 3, GFR 30-59 ml/min   Closed right hip fracture   Malnutrition of moderate degree   Postoperative anemia due to acute blood loss   Discharge Condition: Stable  Diet recommendation: Regular diet  Filed Weights   12/04/14 2330  Weight: 54.885 kg (121 lb)    History of present illness:  From original history of present illness: 81 y.o. female with history of CAD manage medically, seizure disorder, diastolic CHF last EF measured was 74% in May 2011, chronic kidney disease was brought to the ER after patient had a fall at her house. Patient states she was walking from the bathroom to her cerumen she suddenly fell. Denies any loss of consciousness or hitting her head. Denies any chest pain or shortness of breath or palpitations. In the ER x-rays revealed a right hip fracture and on-call orthopedic surgeon Dr. Luiz BlareGraves has been consulted and patient has been admitted for further management.  Hospital Course:  Fracture of femoral neck, right - Ortho board and assisting with management - Patient is status post 4 day postop arthroplasty Bipolar hip (right) - Continue supportive therapy - Physical therapy evaluation recommending SNF on d/c  - Orthopedic surgery recommended aspirin but I discussed case with them and after knowing patient was on Plavix it was decided to continue Plavix on discharge  Active Problems:  Seizure - No breakthrough seizures reported, continue home regimen on discharge    Hypertension - Stable, Patient is currently on Imdur  Fever - Occurred postop day 1 and resolved   Hyperlipidemia - Stable patient on Plavix and statin   CKD (chronic kidney disease) stage 3, GFR 30-59 ml/min - Stable   Malnutrition of moderate degree - Continue ensure at facility  Procedures:  None  Consultations:  Orthopaedic surgery  Discharge Exam: Filed Vitals:   12/09/14 0813  BP: 142/54  Pulse: 83  Temp: 99 F (37.2 C)  Resp: 18    General: Pt in nad, alert and awake  Cardiovascular: rrr, no mrg Respiratory: cta bl, no wheezes  Discharge Instructions   Discharge Instructions    Call MD for:  extreme fatigue    Complete by:  As directed      Call MD for:  severe uncontrolled pain    Complete by:  As directed      Call MD for:  temperature >100.4    Complete by:  As directed      Diet - low sodium heart healthy    Complete by:  As directed      Increase activity slowly    Complete by:  As directed      Weight bearing as tolerated    Complete by:  As directed   Laterality:  right  Extremity:  Lower          Current Discharge Medication List    START taking these medications   Details  ferrous sulfate 325 (65 FE) MG tablet Take 1 tablet (325 mg total) by mouth daily with breakfast. Refills: 3  CONTINUE these medications which have NOT CHANGED   Details  acetaminophen (TYLENOL) 500 MG tablet Take 1,000 mg by mouth every 6 (six) hours as needed. For pain.    Calcium Carbonate-Vitamin D (CALCIUM 600 + D PO) Take 1 tablet by mouth daily.     clopidogrel (PLAVIX) 75 MG tablet Take 75 mg by mouth daily.    escitalopram (LEXAPRO) 10 MG tablet Take 10 mg by mouth daily.    esomeprazole (NEXIUM) 40 MG capsule Take 40 mg by mouth every morning. Before breakfast.    furosemide (LASIX) 20 MG tablet Take 20 mg by mouth 2 (two) times daily.    isosorbide mononitrate (IMDUR) 30 MG 24 hr tablet Take 30 mg by mouth daily.    levETIRAcetam  (KEPPRA) 500 MG tablet Take 750 mg by mouth every 12 (twelve) hours.     menthol-thymol (ABSORBINE JR) LIQD Apply 1 application topically daily as needed (shoulder pain).    nitroGLYCERIN (NITROSTAT) 0.4 MG SL tablet Place 0.4 mg under the tongue every 5 (five) minutes as needed for chest pain.    ondansetron (ZOFRAN ODT) 4 MG disintegrating tablet Take 1 tablet (4 mg total) by mouth every 8 (eight) hours as needed for nausea or vomiting. Qty: 20 tablet, Refills: 0    pravastatin (PRAVACHOL) 40 MG tablet Take 40 mg by mouth daily. Refills: 1    pregabalin (LYRICA) 75 MG capsule Take 75 mg by mouth at bedtime.    tiotropium (SPIRIVA) 18 MCG inhalation capsule Place 18 mcg into inhaler and inhale daily.      STOP taking these medications     diphenhydrAMINE (BENADRYL) 25 MG tablet      HYDROcodone-acetaminophen (NORCO/VICODIN) 5-325 MG per tablet      loperamide (IMODIUM) 2 MG capsule      LORazepam (ATIVAN) 0.5 MG tablet      traMADol (ULTRAM) 50 MG tablet      albuterol (PROVENTIL) (2.5 MG/3ML) 0.083% nebulizer solution      LORazepam (ATIVAN) 0.5 MG tablet        Allergies  Allergen Reactions  . Codeine Nausea And Vomiting   Follow-up Information    Follow up with GRAVES,JOHN L, MD. Schedule an appointment as soon as possible for a visit in 2 weeks.   Specialty:  Orthopedic Surgery   Contact information:   [address] [place] [address] [phone]        The results of significant diagnostics from this hospitalization (including imaging, microbiology, ancillary and laboratory) are listed below for reference.    Significant Diagnostic Studies: Dg Chest 1 View  12/05/2014   CLINICAL DATA:  Hip injury. History of coronary artery disease, hypertension and CHF.  EXAM: CHEST  1 VIEW  COMPARISON:  10/03/2014  FINDINGS: Unchanged cardiomediastinal contours allowing for differences in technique. Hiatal hernia again seen. There is emphysema. Pulmonary vasculature  is normal. No consolidation, pleural effusion, or pneumothorax. A skin fold projects over the left hemithorax. No acute osseous abnormalities are seen.  IMPRESSION: No acute process.   Electronically Signed   By: Rubye Oaks M.D.   On: 12/05/2014 01:52   Pelvis Portable  12/06/2014   CLINICAL DATA:  Right hip hemiarthroplasty  EXAM: PORTABLE PELVIS 1-2 VIEWS  COMPARISON:  Pelvis x-ray from the earlier the same day  FINDINGS: Right hip hemiarthroplasty is located and intact. There is no evidence of periprosthetic fracture. Expected soft tissue gas about the right hip.  IMPRESSION: Unremarkable right hip hemiarthroplasty.   Electronically Signed  By: Marnee Spring M.D.   On: 12/06/2014 03:07   Dg Hip Unilat  With Pelvis 2-3 Views Right  12/05/2014   CLINICAL DATA:  Right hip injury coming out of the bathroom  EXAM: RIGHT HIP (WITH PELVIS) 2-3 VIEWS  COMPARISON:  None.  FINDINGS: Right femoral neck fracture, transcervical or subcapital. The fracture is displaced with femoral head rotation and varus angulation. The femoral head remains located.  No evidence of pelvic ring fracture or diastasis. The left hip is negative.  Osteopenia.  IMPRESSION: Displaced right femoral neck fracture.   Electronically Signed   By: Marnee Spring M.D.   On: 12/05/2014 00:51    Microbiology: Recent Results (from the past 240 hour(s))  Urine culture     Status: None   Collection Time: 12/05/14  6:01 AM  Result Value Ref Range Status   Specimen Description URINE, CATHETERIZED  Final   Special Requests NONE  Final   Colony Count NO GROWTH Performed at [facility]   Final   Culture NO GROWTH Performed at [facility]   Final   Report Status 12/06/2014 FINAL  Final  Surgical pcr screen     Status: None   Collection Time: 12/05/14 12:33 PM  Result Value Ref Range Status   MRSA, PCR NEGATIVE NEGATIVE Final   Staphylococcus aureus NEGATIVE NEGATIVE Final    Comment:        The Xpert SA Assay  (FDA approved for NASAL specimens in patients over 69 years of age), is one component of a comprehensive surveillance program.  Test performance has been validated by [facility] for patients greater than or equal to 58 year old. It is not intended to diagnose infection nor to guide or monitor treatment.      Labs: Basic Metabolic Panel:  Recent Labs Lab 12/05/14 0055 12/05/14 0531 12/06/14 0605 12/07/14 0512 12/08/14 0548  NA 137 137 137 137 138  K 3.9 4.2 4.5 4.2 3.9  CL 98 99 100 102 103  CO2 GLUCOSE 152* 148* 129* 143* 102*  BUN CREATININE 1.36* 1.44* 1.20* 1.11* 0.99  CALCIUM 9.0 8.5 8.1* 8.1* 7.8*   Liver Function Tests:  Recent Labs Lab 12/05/14 0531  AST 22  ALT 12  ALKPHOS 69  BILITOT 0.6  PROT 6.1  ALBUMIN 3.5   No results for input(s): LIPASE, AMYLASE in the last 168 hours. No results for input(s): AMMONIA in the last 168 hours. CBC:  Recent Labs Lab 12/05/14 0055 12/05/14 0531 12/06/14 0605 12/07/14 0512 12/08/14 0548 12/09/14 1000  WBC 13.9* 11.3* 8.5 7.9 7.5 8.4  NEUTROABS 11.3* 9.0*  --   --   --   --   HGB 11.0* 9.9* 8.8* 7.6* 6.9* 10.0*  HCT 32.8* 30.0* 26.8* 23.4* 21.2* 29.3*  MCV 93.7 95.8 94.7 95.5 94.6 90.2  PLT 316 311 241 191 196 202   Cardiac Enzymes: No results for input(s): CKTOTAL, CKMB, CKMBINDEX, TROPONINI in the last 168 hours. BNP: BNP (last 3 results)  Recent Labs  10/03/14 2205  BNP 89.5    ProBNP (last 3 results) No results for input(s): PROBNP in the last 8760 hours.  CBG:  Recent Labs Lab 12/08/14 0611 12/08/14 1124 12/08/14 1622 12/08/14 2114 12/09/14 0626  GLUCAP 93 141* 112* 99 118*     Signed:  Penny Pia  [facility] 12/09/2014, 10:48 AM

## 2014-12-09 NOTE — Progress Notes (Addendum)
Subjective: 4 Days Post-Op Procedure(s) (LRB): ARTHROPLASTY BIPOLAR HIP (Right) Patient reports pain as mild. Patient reports she does not feel well this morning. 2 units of packed RBCs were transfused yesterday. She had a CBC done this morning. Results are pending.  Objective: Vital signs in last 24 hours: Temp:  [98.1 F (36.7 C)-100.3 F (37.9 C)] 99 F (37.2 C) (04/19 0813) Pulse Rate:  [69-92] 83 (04/19 0813) Resp:  [14-18] 18 (04/19 0813) BP: (102-152)/(37-63) 142/54 mmHg (04/19 0813) SpO2:  [93 %-100 %] 99 % (04/19 0813)  Intake/Output from previous day: 04/18 0701 - 04/19 0700 In: 1315 [P.O.:480; Blood:835] Out: 351 [Urine:351] Intake/Output this shift:     Recent Labs  12/07/14 0512 12/08/14 0548  HGB 7.6* 6.9*    Recent Labs  12/07/14 0512 12/08/14 0548  WBC 7.9 7.5  RBC 2.45* 2.24*  HCT 23.4* 21.2*  PLT 191 196    Recent Labs  12/07/14 0512 12/08/14 0548  NA 137 138  K 4.2 3.9  CL 102 103  CO2 27 28  BUN 10 9  CREATININE 1.11* 0.99  GLUCOSE 143* 102*  CALCIUM 8.1* 7.8*   No results for input(s): LABPT, INR in the last 72 hours. Right hip exam: Neurovascular intact Sensation intact distally Intact pulses distally Dorsiflexion/Plantar flexion intact Incision: dressing C/D/I Compartment soft  Assessment/Plan: 4 Days Post-Op Procedure(s) (LRB): ARTHROPLASTY BIPOLAR HIP (Right) Plan: continuePlavix as she was on preoperatively. Weight-bear as tolerated on the right with posterior hip precautions. Would use Tylenol for pain Discharge to SNF per medicine service. Ready from orthopedic viewpoint Will need follow-up with Dr. Luiz BlareGraves in 2 weeks  [name] [name] 12/09/2014, 9:50 AM

## 2014-12-11 ENCOUNTER — Non-Acute Institutional Stay (SKILLED_NURSING_FACILITY): Payer: 59 | Admitting: Adult Health

## 2014-12-11 ENCOUNTER — Encounter: Payer: Self-pay | Admitting: Adult Health

## 2014-12-11 DIAGNOSIS — I10 Essential (primary) hypertension: Secondary | ICD-10-CM | POA: Diagnosis not present

## 2014-12-11 DIAGNOSIS — F329 Major depressive disorder, single episode, unspecified: Secondary | ICD-10-CM

## 2014-12-11 DIAGNOSIS — J449 Chronic obstructive pulmonary disease, unspecified: Secondary | ICD-10-CM

## 2014-12-11 DIAGNOSIS — K219 Gastro-esophageal reflux disease without esophagitis: Secondary | ICD-10-CM

## 2014-12-11 DIAGNOSIS — I2510 Atherosclerotic heart disease of native coronary artery without angina pectoris: Secondary | ICD-10-CM | POA: Diagnosis not present

## 2014-12-11 DIAGNOSIS — I5032 Chronic diastolic (congestive) heart failure: Secondary | ICD-10-CM

## 2014-12-11 DIAGNOSIS — R569 Unspecified convulsions: Secondary | ICD-10-CM | POA: Diagnosis not present

## 2014-12-11 DIAGNOSIS — E785 Hyperlipidemia, unspecified: Secondary | ICD-10-CM | POA: Diagnosis not present

## 2014-12-11 DIAGNOSIS — D62 Acute posthemorrhagic anemia: Secondary | ICD-10-CM | POA: Diagnosis not present

## 2014-12-11 DIAGNOSIS — S72001S Fracture of unspecified part of neck of right femur, sequela: Secondary | ICD-10-CM

## 2014-12-11 DIAGNOSIS — F32A Depression, unspecified: Secondary | ICD-10-CM

## 2014-12-11 NOTE — Progress Notes (Signed)
Patient ID: [name] [name], female   DOB: [birth year], 81 y.o.   MRN: [id number]   12/11/2014  Facility:  Nursing Home Location:  [facility] Room Number: [id number] LEVEL OF CARE:  SNF (31)   Chief Complaint  Patient presents with  . Hospitalization Follow-up    Right hip fracture S/P right hip arthroplasty, anemia, CAD, depression, diastolic CHF, seizure, COPD, GERD, hyperlipidemia and hypertension    HISTORY OF PRESENT ILLNESS:  This is an 81 year old female who has been admitted to [facility] on 12/09/14 from [facility]. She has PMH of CAD, seizure disorder, diastolic CHF with EF 74% (12/2009) and CKD. She fell at home and sustained a right hip fracture. She had arthroplasty, bipolar hip on 12/05/14.  She has been admitted for a short-term rehabilitation.  PAST MEDICAL HISTORY:  Past Medical History  Diagnosis Date  . Fatigue   . Anxiety   . Dizziness   . CAD (coronary artery disease)     a. Cath 2004: 70% ostial LAD, 50-60% prox ramus disease, treated medically. b. Nuc 12/2009 - EF 74%, normal study.  . Hypertension   . Hyperlipidemia   . Coronary artery disease   . Anemia     a. Chronic iron deficiency anemia  . Syncope and collapse     RECURRENT, RELATED TO HYPONATREMIA, NARCOTICS, AND BRADYCARDIA  . Tobacco abuse     PAST  . Memory disorder   . Seizure disorder   . Depression   . Chronic diastolic CHF (congestive heart failure)     a. Echo 02/2014: mild focal basal hypertrophy of septum, Normal LV function EF 55-60%; grade 1 diastolic dysfunction; mildly elevated PASP.  Marland Kitchen GERD (gastroesophageal reflux disease)   . COPD (chronic obstructive pulmonary disease)   . CKD (chronic kidney disease), stage III     CURRENT MEDICATIONS: Reviewed per MAR/see medication list  Allergies  Allergen Reactions  . Codeine Nausea And Vomiting     REVIEW OF SYSTEMS:  GENERAL: no change in appetite, no fatigue, no weight changes, no fever, chills  or weakness RESPIRATORY: no cough, SOB, DOE, wheezing, hemoptysis CARDIAC: no chest pain, edema or palpitations GI: no abdominal pain, diarrhea, constipation, heart burn, nausea or vomiting  PHYSICAL EXAMINATION  GENERAL: no acute distress, normal body habitus SKIN:  Right hip surgical site has dry dressing, no edema nor erythema EYES: conjunctivae normal, sclerae normal, normal eye lids NECK: supple, trachea midline, no neck masses, no thyroid tenderness, no thyromegaly LYMPHATICS: no LAN in the neck, no supraclavicular LAN RESPIRATORY: breathing is even & unlabored, BS CTAB CARDIAC: RRR, no murmur,no extra heart sounds, no edema GI: abdomen soft, normal BS, no masses, no tenderness, no hepatomegaly, no splenomegaly EXTREMITIES: Able to move 4 extremities PSYCHIATRIC: the patient is alert & oriented to person, affect & behavior appropriate  LABS/RADIOLOGY: Labs reviewed: Basic Metabolic Panel:  Recent Labs  69/62/95 0605 12/07/14 0512 12/08/14 0548  NA 137 137 138  K 4.5 4.2 3.9  CL 100 102 103  CO2 GLUCOSE 129* 143* 102*  BUN CREATININE 1.20* 1.11* 0.99  CALCIUM 8.1* 8.1* 7.8*   Liver Function Tests:  Recent Labs  04/08/14 1040 08/21/14 1328 12/05/14 0531  AST ALT ALKPHOS 61 51 69  BILITOT 0.3 0.6 0.6  PROT 6.9 7.0 6.1  ALBUMIN 3.8 4.3 3.5    Recent Labs  08/21/14 1328  LIPASE 40   CBC:  Recent Labs  10/03/14 2205 12/05/14 0055 12/05/14 0531  12/07/14 0512 12/08/14 0548 12/09/14 1000  WBC 8.7 13.9* 11.3*  < > 7.9 7.5 8.4  NEUTROABS 3.4 11.3* 9.0*  --   --   --   --   HGB 12.3 11.0* 9.9*  < > 7.6* 6.9* 10.0*  HCT 35.8* 32.8* 30.0*  < > 23.4* 21.2* 29.3*  MCV 91.6 93.7 95.8  < > 95.5 94.6 90.2  PLT 311 316 311  < > 191 196 202  < > = values in this interval not displayed.  Cardiac Enzymes:  Recent Labs  04/08/14 1607 04/08/14 2040 10/04/14 0100  TROPONINI <0.30 <0.30 <0.03   CBG:  Recent  Labs  12/08/14 2114 12/09/14 0626 12/09/14 1127  GLUCAP 99 118* 172*    Dg Chest 1 View  12/05/2014   CLINICAL DATA:  Hip injury. History of coronary artery disease, hypertension and CHF.  EXAM: CHEST  1 VIEW  COMPARISON:  10/03/2014  FINDINGS: Unchanged cardiomediastinal contours allowing for differences in technique. Hiatal hernia again seen. There is emphysema. Pulmonary vasculature is normal. No consolidation, pleural effusion, or pneumothorax. A skin fold projects over the left hemithorax. No acute osseous abnormalities are seen.  IMPRESSION: No acute process.   Electronically Signed   By: Rubye OaksMelanie  Ehinger M.D.   On: 12/05/2014 01:52   Pelvis Portable  12/06/2014   CLINICAL DATA:  Right hip hemiarthroplasty  EXAM: PORTABLE PELVIS 1-2 VIEWS  COMPARISON:  Pelvis x-ray from the earlier the same day  FINDINGS: Right hip hemiarthroplasty is located and intact. There is no evidence of periprosthetic fracture. Expected soft tissue gas about the right hip.  IMPRESSION: Unremarkable right hip hemiarthroplasty.   Electronically Signed   By: Marnee SpringJonathon  Watts M.D.   On: 12/06/2014 03:07   Dg Hip Unilat  With Pelvis 2-3 Views Right  12/05/2014   CLINICAL DATA:  Right hip injury coming out of the bathroom  EXAM: RIGHT HIP (WITH PELVIS) 2-3 VIEWS  COMPARISON:  None.  FINDINGS: Right femoral neck fracture, transcervical or subcapital. The fracture is displaced with femoral head rotation and varus angulation. The femoral head remains located.  No evidence of pelvic ring fracture or diastasis. The left hip is negative.  Osteopenia.  IMPRESSION: Displaced right femoral neck fracture.   Electronically Signed   By: Marnee SpringJonathon  Watts M.D.   On: 12/05/2014 00:51    ASSESSMENT/PLAN:  Right hip fracture S/P arthroplasty, bipolar right hip - for rehabilitation; follow-up with Dr. Luiz BlareGraves, orthopedic surgeon, in 2 weeks; continue Robaxin 500 mg by mouth every 6 hours when necessary for muscle spasm and tramadol 50 mg 1-2  tabs by mouth every 6 hours when necessary for pain Anemia, acute blood loss - hemoglobin 10.0; continue ferrous sulfate 325 mg by mouth daily CAD - continue Plavix 75 mg by mouth daily, NTG when necessary and into her 30 mg by mouth daily Depression - moderate disease is stable; continue Lexapro 10 mg by mouth daily Diastolic CHF - stable; continue Lasix 20 mg by mouth twice a day, daily weights Seizure disorder - continue Keppra 750 mg by mouth twice a day and Lyrica 75 mg by mouth daily at bedtime COPD -  continuous Spiriva 18 g inhale daily; check O2 sat on room air twice a day 1 week Hyperlipidemia - continue atorvastatin 10 mg by mouth daily Hypertension - well controlled; continue Imdur 30 mg by mouth daily GERD - continue Prilosec 20  mg by mouth daily   Goals of care:  Short-term rehabilitation   Labs/test ordered:  CBC, BMP  Spent 50 minutes in patient care.    [facility], NP [employer] [phone]

## 2014-12-12 ENCOUNTER — Other Ambulatory Visit: Payer: Self-pay

## 2014-12-12 MED ORDER — TRAMADOL HCL 50 MG PO TABS: 50.0000 mg | ORAL_TABLET | Freq: Four times a day (QID) | ORAL | Status: DC | PRN

## 2014-12-12 NOTE — Telephone Encounter (Signed)
Rx faxed to [facility] @ [phone], phone number [phone]  

## 2014-12-15 ENCOUNTER — Encounter (HOSPITAL_COMMUNITY): Payer: Self-pay | Admitting: Orthopedic Surgery

## 2014-12-15 NOTE — Anesthesia Postprocedure Evaluation (Signed)
  Anesthesia Post-op Note  Patient: [name] [name]  Procedure(s) Performed: Procedure(s): ARTHROPLASTY BIPOLAR HIP (Right)  Patient Location: PACU  Anesthesia Type:General  Level of Consciousness: awake and alert   Airway and Oxygen Therapy: Patient Spontanous Breathing  Post-op Pain: mild  Post-op Assessment: Post-op Vital signs reviewed  Post-op Vital Signs: Reviewed  Last Vitals:  Filed Vitals:   12/09/14 1258  BP: 112/53  Pulse: 91  Temp: 36.7 C  Resp: 18    Complications: No apparent anesthesia complications

## 2014-12-15 NOTE — Transfer of Care (Signed)
Immediate Anesthesia Transfer of Care Note  Patient: [name] [name]  Procedure(s) Performed: Procedure(s): ARTHROPLASTY BIPOLAR HIP (Right)  Patient Location: PACU  Anesthesia Type:General  Level of Consciousness: awake and alert   Airway & Oxygen Therapy: Patient Spontanous Breathing  Post-op Assessment: Report given to RN  Post vital signs: Reviewed  Last Vitals:  Filed Vitals:   12/09/14 1258  BP: 112/53  Pulse: 91  Temp: 36.7 C  Resp: 18    Complications: No apparent anesthesia complications

## 2014-12-16 ENCOUNTER — Other Ambulatory Visit: Payer: Self-pay | Admitting: *Deleted

## 2014-12-16 MED ORDER — TRAMADOL HCL 50 MG PO TABS: ORAL_TABLET | ORAL | Status: DC

## 2014-12-16 NOTE — Telephone Encounter (Signed)
[facility] 

## 2014-12-26 ENCOUNTER — Encounter: Payer: Self-pay | Admitting: Adult Health

## 2014-12-26 ENCOUNTER — Non-Acute Institutional Stay (SKILLED_NURSING_FACILITY): Payer: Medicare Other | Admitting: Adult Health

## 2014-12-26 DIAGNOSIS — I2510 Atherosclerotic heart disease of native coronary artery without angina pectoris: Secondary | ICD-10-CM

## 2014-12-26 DIAGNOSIS — R569 Unspecified convulsions: Secondary | ICD-10-CM | POA: Diagnosis not present

## 2014-12-26 DIAGNOSIS — S72001S Fracture of unspecified part of neck of right femur, sequela: Secondary | ICD-10-CM

## 2014-12-26 DIAGNOSIS — E785 Hyperlipidemia, unspecified: Secondary | ICD-10-CM | POA: Diagnosis not present

## 2014-12-26 DIAGNOSIS — I5032 Chronic diastolic (congestive) heart failure: Secondary | ICD-10-CM

## 2014-12-26 DIAGNOSIS — K219 Gastro-esophageal reflux disease without esophagitis: Secondary | ICD-10-CM

## 2014-12-26 DIAGNOSIS — D62 Acute posthemorrhagic anemia: Secondary | ICD-10-CM | POA: Diagnosis not present

## 2014-12-26 DIAGNOSIS — J449 Chronic obstructive pulmonary disease, unspecified: Secondary | ICD-10-CM | POA: Diagnosis not present

## 2014-12-26 DIAGNOSIS — F329 Major depressive disorder, single episode, unspecified: Secondary | ICD-10-CM | POA: Diagnosis not present

## 2014-12-26 DIAGNOSIS — F32A Depression, unspecified: Secondary | ICD-10-CM

## 2014-12-26 DIAGNOSIS — I10 Essential (primary) hypertension: Secondary | ICD-10-CM

## 2014-12-26 NOTE — Progress Notes (Signed)
Patient ID: [name] GoldenAgnes G [name], female   DOB: [birth year], 81 y.o.   MRN: [id number]   12/26/2014  Facility:  Nursing Home Location:  [facility] Room Number: 705-P LEVEL OF CARE:  SNF (31)   Chief Complaint  Patient presents with  . Discharge Note    Right hip fracture S/P right hip arthroplasty, anemia, CAD, depression, diastolic CHF, seizure, COPD, GERD, hyperlipidemia and hypertension    HISTORY OF PRESENT ILLNESS:  This is an 81 year old female who is for discharge home with Home health PT, OT, SW, ST and CNA. She has been [facility]. She has PMH of CAD, seizure disorder, diastolic CHF with EF 74% (12/2009) and CKD. She fell at home and sustained a right hip fracture. She had arthroplasty, bipolar hip on 12/05/14.  Patient was admitted to this facility for short-term rehabilitation after the patient's recent hospitalization.  Patient has completed SNF rehabilitation and therapy has cleared the patient for discharge.   PAST MEDICAL HISTORY:  Past Medical History  Diagnosis Date  . Fatigue   . Anxiety   . Dizziness   . CAD (coronary artery disease)     a. Cath 2004: 70% ostial LAD, 50-60% prox ramus disease, treated medically. b. Nuc 12/2009 - EF 74%, normal study.  . Hypertension   . Hyperlipidemia   . Coronary artery disease   . Anemia     a. Chronic iron deficiency anemia  . Syncope and collapse     RECURRENT, RELATED TO HYPONATREMIA, NARCOTICS, AND BRADYCARDIA  . Tobacco abuse     PAST  . Memory disorder   . Seizure disorder   . Depression   . Chronic diastolic CHF (congestive heart failure)     a. Echo 02/2014: mild focal basal hypertrophy of septum, Normal LV function EF 55-60%; grade 1 diastolic dysfunction; mildly elevated PASP.  Marland Kitchen. GERD (gastroesophageal reflux disease)   . COPD (chronic obstructive pulmonary disease)   . CKD (chronic kidney disease), stage III     CURRENT MEDICATIONS:  Reviewed per MAR/see medication list  Allergies  Allergen Reactions  . Codeine Nausea And Vomiting     REVIEW OF SYSTEMS:  GENERAL: no change in appetite, no fatigue, no weight changes, no fever, chills or weakness RESPIRATORY: no cough, SOB, DOE, wheezing, hemoptysis CARDIAC: no chest pain, edema or palpitations GI: no abdominal pain, diarrhea, constipation, heart burn, nausea or vomiting  PHYSICAL EXAMINATION  GENERAL: no acute distress, normal body habitus SKIN:  Right hip surgical site has dry dressing, no edema nor erythema EYES: conjunctivae normal, sclerae normal, normal eye lids NECK: supple, trachea midline, no neck masses, no thyroid tenderness, no thyromegaly LYMPHATICS: no LAN in the neck, no supraclavicular LAN RESPIRATORY: breathing is even & unlabored, BS CTAB CARDIAC: RRR, no murmur,no extra heart sounds, no edema GI: abdomen soft, normal BS, no masses, no tenderness, no hepatomegaly, no splenomegaly EXTREMITIES: Able to move 4 extremities PSYCHIATRIC: the patient is alert & oriented to person, affect & behavior appropriate  LABS/RADIOLOGY: Labs reviewed: 12/12/14  WBC 8.7 hemoglobin 10.5 hematocrit 31.6 MCV 91.6 sodium 139 potassium 4.6 glucose86  BUN 18  Creatinine 0.91  CA 9.1 Basic Metabolic Panel:  Recent Labs  40/98/1104/16/16 0605 12/07/14 0512 12/08/14 0548  NA 137 137 138  K 4.5 4.2 3.9  CL 100 102 103  CO2 25 27 28   GLUCOSE 129* 143* 102*  BUN 17 10 9   CREATININE 1.20* 1.11* 0.99  CALCIUM 8.1* 8.1* 7.8*   Liver Function Tests:  Recent Labs  04/08/14 1040 08/21/14 1328 12/05/14 0531  AST ALT ALKPHOS 61 51 69  BILITOT 0.3 0.6 0.6  PROT 6.9 7.0 6.1  ALBUMIN 3.8 4.3 3.5    Recent Labs  08/21/14 1328  LIPASE 40   CBC:  Recent Labs  10/03/14 2205 12/05/14 0055 12/05/14 0531  12/07/14 0512 12/08/14 0548 12/09/14 1000  WBC 8.7 13.9* 11.3*  < > 7.9 7.5 8.4  NEUTROABS 3.4 11.3* 9.0*  --   --   --   --   HGB  12.3 11.0* 9.9*  < > 7.6* 6.9* 10.0*  HCT 35.8* 32.8* 30.0*  < > 23.4* 21.2* 29.3*  MCV 91.6 93.7 95.8  < > 95.5 94.6 90.2  PLT 311 316 311  < > 191 196 202  < > = values in this interval not displayed.  Cardiac Enzymes:  Recent Labs  04/08/14 1607 04/08/14 2040 10/04/14 0100  TROPONINI <0.30 <0.30 <0.03   CBG:  Recent Labs  12/08/14 2114 12/09/14 0626 12/09/14 1127  GLUCAP 99 118* 172*    Dg Chest 1 View  12/05/2014   CLINICAL DATA:  Hip injury. History of coronary artery disease, hypertension and CHF.  EXAM: CHEST  1 VIEW  COMPARISON:  10/03/2014  FINDINGS: Unchanged cardiomediastinal contours allowing for differences in technique. Hiatal hernia again seen. There is emphysema. Pulmonary vasculature is normal. No consolidation, pleural effusion, or pneumothorax. A skin fold projects over the left hemithorax. No acute osseous abnormalities are seen.  IMPRESSION: No acute process.   Electronically Signed   By: Rubye Oaks M.D.   On: 12/05/2014 01:52   Pelvis Portable  12/06/2014   CLINICAL DATA:  Right hip hemiarthroplasty  EXAM: PORTABLE PELVIS 1-2 VIEWS  COMPARISON:  Pelvis x-ray from the earlier the same day  FINDINGS: Right hip hemiarthroplasty is located and intact. There is no evidence of periprosthetic fracture. Expected soft tissue gas about the right hip.  IMPRESSION: Unremarkable right hip hemiarthroplasty.   Electronically Signed   By: Marnee Spring M.D.   On: 12/06/2014 03:07   Dg Hip Unilat  With Pelvis 2-3 Views Right  12/05/2014   CLINICAL DATA:  Right hip injury coming out of the bathroom  EXAM: RIGHT HIP (WITH PELVIS) 2-3 VIEWS  COMPARISON:  None.  FINDINGS: Right femoral neck fracture, transcervical or subcapital. The fracture is displaced with femoral head rotation and varus angulation. The femoral head remains located.  No evidence of pelvic ring fracture or diastasis. The left hip is negative.  Osteopenia.  IMPRESSION: Displaced right femoral neck fracture.    Electronically Signed   By: Marnee Spring M.D.   On: 12/05/2014 00:51    ASSESSMENT/PLAN:  Right hip fracture S/P arthroplasty, bipolar right hip - for Home health PT, OT, SW, Nursing, ST and CNA; follow-up with Dr. Luiz Blare, orthopedic surgeon, in 2-3 weeks; continue Robaxin 500 mg by mouth every 6 hours when necessary for muscle spasm and tramadol 50 mg 2 tabs = 100 mg by mouth every 4 hours when necessary for pain Anemia, acute blood loss - hemoglobin 10.5; continue ferrous sulfate 325 mg by mouth daily CAD - continue Plavix 75 mg by mouth daily, NTG when necessary and into her 30 mg by mouth daily Depression - moderate disease is stable; continue Lexapro 10 mg by mouth daily Diastolic CHF - stable; continue Lasix 20 mg by mouth twice a  day, daily weights Seizure disorder - continue Keppra 750 mg by mouth twice a day and Lyrica 75 mg by mouth daily at bedtime COPD -  continuous Spiriva 18 g inhale daily; check O2 sat on room air twice a day 1 week Hyperlipidemia - continue atorvastatin 10 mg by mouth daily Hypertension - well controlled; continue Imdur 30 mg by mouth daily GERD - continue Prilosec 20 mg by mouth daily    I have filled out patient's discharge paperwork and written prescriptions.  Patient will receive home health PT, OT, ST, Nursing and CNA.  Total discharge time: Less than 30 minutes  Discharge time involved coordination of the discharge process with Child psychotherapistsocial worker, nursing staff and therapy department. Medical justification for home health services verified.     [facility],Kiowa Hollar, NP [facility] [phone]

## 2015-01-18 ENCOUNTER — Inpatient Hospital Stay (HOSPITAL_COMMUNITY): Payer: Medicare Other

## 2015-01-18 ENCOUNTER — Inpatient Hospital Stay (HOSPITAL_COMMUNITY): Payer: Medicare Other | Admitting: Anesthesiology

## 2015-01-18 ENCOUNTER — Encounter (HOSPITAL_COMMUNITY): Payer: Self-pay | Admitting: *Deleted

## 2015-01-18 ENCOUNTER — Encounter (HOSPITAL_COMMUNITY)
Admission: EM | Disposition: A | Discharge status: Skilled Nursing Facility | Payer: Self-pay | Source: Home / Self Care | Attending: Family Medicine

## 2015-01-18 ENCOUNTER — Inpatient Hospital Stay (HOSPITAL_COMMUNITY)
Admission: EM | Admit: 2015-01-18 | Discharge: 2015-01-22 | DRG: 480 | Disposition: A | Discharge status: Skilled Nursing Facility | Payer: Medicare Other | Attending: Internal Medicine | Admitting: Internal Medicine

## 2015-01-18 ENCOUNTER — Emergency Department (HOSPITAL_COMMUNITY): Payer: Medicare Other

## 2015-01-18 DIAGNOSIS — R627 Adult failure to thrive: Secondary | ICD-10-CM | POA: Diagnosis present

## 2015-01-18 DIAGNOSIS — I5032 Chronic diastolic (congestive) heart failure: Secondary | ICD-10-CM | POA: Diagnosis present

## 2015-01-18 DIAGNOSIS — Z79899 Other long term (current) drug therapy: Secondary | ICD-10-CM | POA: Diagnosis not present

## 2015-01-18 DIAGNOSIS — F419 Anxiety disorder, unspecified: Secondary | ICD-10-CM | POA: Diagnosis present

## 2015-01-18 DIAGNOSIS — D649 Anemia, unspecified: Secondary | ICD-10-CM | POA: Diagnosis present

## 2015-01-18 DIAGNOSIS — R296 Repeated falls: Secondary | ICD-10-CM | POA: Diagnosis present

## 2015-01-18 DIAGNOSIS — E871 Hypo-osmolality and hyponatremia: Secondary | ICD-10-CM | POA: Diagnosis present

## 2015-01-18 DIAGNOSIS — Z8249 Family history of ischemic heart disease and other diseases of the circulatory system: Secondary | ICD-10-CM | POA: Diagnosis not present

## 2015-01-18 DIAGNOSIS — Z9889 Other specified postprocedural states: Secondary | ICD-10-CM

## 2015-01-18 DIAGNOSIS — N183 Chronic kidney disease, stage 3 unspecified: Secondary | ICD-10-CM | POA: Diagnosis present

## 2015-01-18 DIAGNOSIS — D638 Anemia in other chronic diseases classified elsewhere: Secondary | ICD-10-CM | POA: Diagnosis present

## 2015-01-18 DIAGNOSIS — E43 Unspecified severe protein-calorie malnutrition: Secondary | ICD-10-CM | POA: Diagnosis present

## 2015-01-18 DIAGNOSIS — I10 Essential (primary) hypertension: Secondary | ICD-10-CM | POA: Diagnosis present

## 2015-01-18 DIAGNOSIS — Z7982 Long term (current) use of aspirin: Secondary | ICD-10-CM | POA: Diagnosis not present

## 2015-01-18 DIAGNOSIS — G40909 Epilepsy, unspecified, not intractable, without status epilepticus: Secondary | ICD-10-CM

## 2015-01-18 DIAGNOSIS — I2510 Atherosclerotic heart disease of native coronary artery without angina pectoris: Secondary | ICD-10-CM | POA: Diagnosis present

## 2015-01-18 DIAGNOSIS — F329 Major depressive disorder, single episode, unspecified: Secondary | ICD-10-CM | POA: Diagnosis present

## 2015-01-18 DIAGNOSIS — E876 Hypokalemia: Secondary | ICD-10-CM | POA: Diagnosis present

## 2015-01-18 DIAGNOSIS — E785 Hyperlipidemia, unspecified: Secondary | ICD-10-CM | POA: Diagnosis present

## 2015-01-18 DIAGNOSIS — E860 Dehydration: Secondary | ICD-10-CM | POA: Diagnosis present

## 2015-01-18 DIAGNOSIS — Z96641 Presence of right artificial hip joint: Secondary | ICD-10-CM | POA: Diagnosis present

## 2015-01-18 DIAGNOSIS — N179 Acute kidney failure, unspecified: Secondary | ICD-10-CM | POA: Diagnosis present

## 2015-01-18 DIAGNOSIS — W1839XA Other fall on same level, initial encounter: Secondary | ICD-10-CM | POA: Diagnosis present

## 2015-01-18 DIAGNOSIS — Z7902 Long term (current) use of antithrombotics/antiplatelets: Secondary | ICD-10-CM | POA: Diagnosis not present

## 2015-01-18 DIAGNOSIS — Y92018 Other place in single-family (private) house as the place of occurrence of the external cause: Secondary | ICD-10-CM

## 2015-01-18 DIAGNOSIS — S7291XA Unspecified fracture of right femur, initial encounter for closed fracture: Secondary | ICD-10-CM

## 2015-01-18 DIAGNOSIS — E440 Moderate protein-calorie malnutrition: Secondary | ICD-10-CM | POA: Insufficient documentation

## 2015-01-18 DIAGNOSIS — Z9660 Presence of unspecified orthopedic joint implant: Secondary | ICD-10-CM | POA: Diagnosis not present

## 2015-01-18 DIAGNOSIS — D62 Acute posthemorrhagic anemia: Secondary | ICD-10-CM | POA: Diagnosis not present

## 2015-01-18 DIAGNOSIS — J449 Chronic obstructive pulmonary disease, unspecified: Secondary | ICD-10-CM | POA: Diagnosis present

## 2015-01-18 DIAGNOSIS — Y92009 Unspecified place in unspecified non-institutional (private) residence as the place of occurrence of the external cause: Secondary | ICD-10-CM

## 2015-01-18 DIAGNOSIS — L02415 Cutaneous abscess of right lower limb: Secondary | ICD-10-CM | POA: Diagnosis present

## 2015-01-18 DIAGNOSIS — K219 Gastro-esophageal reflux disease without esophagitis: Secondary | ICD-10-CM | POA: Diagnosis present

## 2015-01-18 DIAGNOSIS — T148XXA Other injury of unspecified body region, initial encounter: Secondary | ICD-10-CM

## 2015-01-18 DIAGNOSIS — T502X5A Adverse effect of carbonic-anhydrase inhibitors, benzothiadiazides and other diuretics, initial encounter: Secondary | ICD-10-CM | POA: Diagnosis present

## 2015-01-18 DIAGNOSIS — T84049S Periprosthetic fracture around unspecified internal prosthetic joint, sequela: Secondary | ICD-10-CM | POA: Diagnosis not present

## 2015-01-18 DIAGNOSIS — Z87891 Personal history of nicotine dependence: Secondary | ICD-10-CM | POA: Diagnosis not present

## 2015-01-18 DIAGNOSIS — S7290XA Unspecified fracture of unspecified femur, initial encounter for closed fracture: Secondary | ICD-10-CM | POA: Insufficient documentation

## 2015-01-18 DIAGNOSIS — T84040A Periprosthetic fracture around internal prosthetic right hip joint, initial encounter: Secondary | ICD-10-CM | POA: Diagnosis present

## 2015-01-18 DIAGNOSIS — I129 Hypertensive chronic kidney disease with stage 1 through stage 4 chronic kidney disease, or unspecified chronic kidney disease: Secondary | ICD-10-CM | POA: Diagnosis present

## 2015-01-18 DIAGNOSIS — Z8781 Personal history of (healed) traumatic fracture: Secondary | ICD-10-CM

## 2015-01-18 DIAGNOSIS — T814XXA Infection following a procedure, initial encounter: Secondary | ICD-10-CM | POA: Diagnosis present

## 2015-01-18 DIAGNOSIS — K210 Gastro-esophageal reflux disease with esophagitis: Secondary | ICD-10-CM | POA: Diagnosis not present

## 2015-01-18 DIAGNOSIS — M979XXA Periprosthetic fracture around unspecified internal prosthetic joint, initial encounter: Secondary | ICD-10-CM

## 2015-01-18 DIAGNOSIS — W19XXXA Unspecified fall, initial encounter: Secondary | ICD-10-CM

## 2015-01-18 HISTORY — PX: ORIF PERIPROSTHETIC FRACTURE: SHX5034

## 2015-01-18 LAB — POCT I-STAT 4, (NA,K, GLUC, HGB,HCT)
GLUCOSE: 137 mg/dL — AB (ref 65–99)
Glucose, Bld: 159 mg/dL — ABNORMAL HIGH (ref 65–99)
HCT: 27 % — ABNORMAL LOW (ref 36.0–46.0)
HEMATOCRIT: 24 % — AB (ref 36.0–46.0)
HEMOGLOBIN: 8.2 g/dL — AB (ref 12.0–15.0)
Hemoglobin: 9.2 g/dL — ABNORMAL LOW (ref 12.0–15.0)
POTASSIUM: 3.4 mmol/L — AB (ref 3.5–5.1)
Potassium: 3.4 mmol/L — ABNORMAL LOW (ref 3.5–5.1)
SODIUM: 140 mmol/L (ref 135–145)
Sodium: 138 mmol/L (ref 135–145)

## 2015-01-18 LAB — URINALYSIS, ROUTINE W REFLEX MICROSCOPIC
BILIRUBIN URINE: NEGATIVE
GLUCOSE, UA: NEGATIVE mg/dL
HGB URINE DIPSTICK: NEGATIVE
Ketones, ur: NEGATIVE mg/dL
Nitrite: NEGATIVE
PH: 5 (ref 5.0–8.0)
PROTEIN: NEGATIVE mg/dL
Specific Gravity, Urine: 1.017 (ref 1.005–1.030)
UROBILINOGEN UA: 0.2 mg/dL (ref 0.0–1.0)

## 2015-01-18 LAB — PROTIME-INR
INR: 1.1 (ref 0.00–1.49)
PROTHROMBIN TIME: 14.4 s (ref 11.6–15.2)

## 2015-01-18 LAB — CBC WITH DIFFERENTIAL/PLATELET
Basophils Absolute: 0 10*3/uL (ref 0.0–0.1)
Basophils Relative: 0 % (ref 0–1)
EOS PCT: 1 % (ref 0–5)
Eosinophils Absolute: 0.1 10*3/uL (ref 0.0–0.7)
HCT: 33.5 % — ABNORMAL LOW (ref 36.0–46.0)
Hemoglobin: 11.1 g/dL — ABNORMAL LOW (ref 12.0–15.0)
LYMPHS PCT: 29 % (ref 12–46)
Lymphs Abs: 3.1 10*3/uL (ref 0.7–4.0)
MCH: 30.4 pg (ref 26.0–34.0)
MCHC: 33.1 g/dL (ref 30.0–36.0)
MCV: 91.8 fL (ref 78.0–100.0)
MONO ABS: 1.1 10*3/uL — AB (ref 0.1–1.0)
Monocytes Relative: 10 % (ref 3–12)
Neutro Abs: 6.7 10*3/uL (ref 1.7–7.7)
Neutrophils Relative %: 60 % (ref 43–77)
Platelets: 355 10*3/uL (ref 150–400)
RBC: 3.65 MIL/uL — ABNORMAL LOW (ref 3.87–5.11)
RDW: 14.1 % (ref 11.5–15.5)
WBC: 10.8 10*3/uL — ABNORMAL HIGH (ref 4.0–10.5)

## 2015-01-18 LAB — BASIC METABOLIC PANEL
Anion gap: 13 (ref 5–15)
BUN: 27 mg/dL — AB (ref 6–20)
CHLORIDE: 98 mmol/L — AB (ref 101–111)
CO2: 22 mmol/L (ref 22–32)
Calcium: 8.4 mg/dL — ABNORMAL LOW (ref 8.9–10.3)
Creatinine, Ser: 1.53 mg/dL — ABNORMAL HIGH (ref 0.44–1.00)
GFR calc Af Amer: 34 mL/min — ABNORMAL LOW (ref >60)
GFR calc non Af Amer: 30 mL/min — ABNORMAL LOW (ref >60)
Glucose, Bld: 123 mg/dL — ABNORMAL HIGH (ref 65–99)
Potassium: 3.1 mmol/L — ABNORMAL LOW (ref 3.5–5.1)
Sodium: 133 mmol/L — ABNORMAL LOW (ref 135–145)

## 2015-01-18 LAB — IRON AND TIBC
IRON: 32 ug/dL (ref 28–170)
Saturation Ratios: 14 % (ref 10.4–31.8)
TIBC: 224 ug/dL — AB (ref 250–450)
UIBC: 192 ug/dL

## 2015-01-18 LAB — PREPARE RBC (CROSSMATCH)

## 2015-01-18 LAB — SURGICAL PCR SCREEN
MRSA, PCR: POSITIVE — AB
Staphylococcus aureus: POSITIVE — AB

## 2015-01-18 LAB — CALCIUM: Calcium: 8.3 mg/dL — ABNORMAL LOW (ref 8.9–10.3)

## 2015-01-18 LAB — URINE MICROSCOPIC-ADD ON

## 2015-01-18 LAB — TSH: TSH: 2.4 u[IU]/mL (ref 0.350–4.500)

## 2015-01-18 LAB — FERRITIN: FERRITIN: 530 ng/mL — AB (ref 11–307)

## 2015-01-18 LAB — VITAMIN B12: Vitamin B-12: 538 pg/mL (ref 180–914)

## 2015-01-18 LAB — RETICULOCYTES
RBC.: 3.35 MIL/uL — AB (ref 3.87–5.11)
RETIC CT PCT: 1 % (ref 0.4–3.1)
Retic Count, Absolute: 33.5 10*3/uL (ref 19.0–186.0)

## 2015-01-18 LAB — FOLATE: Folate: 9.6 ng/mL (ref >5.9)

## 2015-01-18 LAB — ALBUMIN: Albumin: 3.7 g/dL (ref 3.5–5.0)

## 2015-01-18 SURGERY — OPEN REDUCTION INTERNAL FIXATION (ORIF) PERIPROSTHETIC FRACTURE
Anesthesia: General | Laterality: Right

## 2015-01-18 MED ORDER — DEXTROSE 5 % IV SOLN
500.0000 mg | Freq: Four times a day (QID) | INTRAVENOUS | Status: DC | PRN
  Filled 2015-01-18: qty 5

## 2015-01-18 MED ORDER — ONDANSETRON HCL 4 MG/2ML IJ SOLN
INTRAMUSCULAR | Status: AC
  Filled 2015-01-18: qty 2

## 2015-01-18 MED ORDER — ONDANSETRON HCL 4 MG/2ML IJ SOLN: 4.0000 mg | Freq: Four times a day (QID) | INTRAMUSCULAR | Status: DC | PRN

## 2015-01-18 MED ORDER — MORPHINE SULFATE 2 MG/ML IJ SOLN: 0.5000 mg | INTRAMUSCULAR | Status: DC | PRN

## 2015-01-18 MED ORDER — ACETAMINOPHEN 325 MG PO TABS
650.0000 mg | ORAL_TABLET | Freq: Four times a day (QID) | ORAL | Status: DC | PRN
  Administered 2015-01-18 – 2015-01-21 (×3): 650 mg via ORAL
  Filled 2015-01-18 (×4): qty 2

## 2015-01-18 MED ORDER — DEXTROSE-NACL 5-0.45 % IV SOLN: 100.0000 mL/h | INTRAVENOUS | Status: DC

## 2015-01-18 MED ORDER — NEOSTIGMINE METHYLSULFATE 10 MG/10ML IV SOLN
INTRAVENOUS | Status: AC
  Filled 2015-01-18: qty 1

## 2015-01-18 MED ORDER — SODIUM CHLORIDE 0.9 % IV SOLN
10.0000 mg | INTRAVENOUS | Status: DC | PRN
  Administered 2015-01-18: 20 ug/min via INTRAVENOUS

## 2015-01-18 MED ORDER — METOCLOPRAMIDE HCL 5 MG/ML IJ SOLN: 5.0000 mg | Freq: Three times a day (TID) | INTRAMUSCULAR | Status: DC | PRN

## 2015-01-18 MED ORDER — GLYCOPYRROLATE 0.2 MG/ML IJ SOLN
INTRAMUSCULAR | Status: AC
  Filled 2015-01-18: qty 3

## 2015-01-18 MED ORDER — DEXAMETHASONE SODIUM PHOSPHATE 4 MG/ML IJ SOLN
INTRAMUSCULAR | Status: AC
  Filled 2015-01-18: qty 1

## 2015-01-18 MED ORDER — CEFAZOLIN SODIUM-DEXTROSE 2-3 GM-% IV SOLR
INTRAVENOUS | Status: AC
  Administered 2015-01-18 (×2): 2 g via INTRAVENOUS
  Filled 2015-01-18: qty 50

## 2015-01-18 MED ORDER — HYDROCODONE-ACETAMINOPHEN 5-325 MG PO TABS
1.0000 | ORAL_TABLET | Freq: Four times a day (QID) | ORAL | Status: DC | PRN
  Administered 2015-01-18 – 2015-01-19 (×4): 2 via ORAL
  Administered 2015-01-20 (×2): 1 via ORAL
  Administered 2015-01-20: 2 via ORAL
  Administered 2015-01-20 – 2015-01-21 (×2): 1 via ORAL
  Administered 2015-01-21: 2 via ORAL
  Administered 2015-01-22: 1 via ORAL
  Administered 2015-01-22: 2 via ORAL
  Administered 2015-01-22 (×2): 1 via ORAL
  Filled 2015-01-18 (×2): qty 1
  Filled 2015-01-18 (×2): qty 2
  Filled 2015-01-18 (×2): qty 1
  Filled 2015-01-18: qty 2
  Filled 2015-01-18: qty 1
  Filled 2015-01-18 (×5): qty 2
  Filled 2015-01-18: qty 1

## 2015-01-18 MED ORDER — CHLORHEXIDINE GLUCONATE CLOTH 2 % EX PADS
6.0000 | MEDICATED_PAD | Freq: Every day | CUTANEOUS | Status: DC
  Administered 2015-01-19 – 2015-01-22 (×3): 6 via TOPICAL

## 2015-01-18 MED ORDER — HEPARIN SODIUM (PORCINE) 5000 UNIT/ML IJ SOLN: 5000.0000 [IU] | Freq: Three times a day (TID) | INTRAMUSCULAR | Status: DC

## 2015-01-18 MED ORDER — HYDROMORPHONE HCL 1 MG/ML IJ SOLN
0.5000 mg | INTRAMUSCULAR | Status: DC | PRN
  Administered 2015-01-18 – 2015-01-19 (×5): 0.5 mg via INTRAVENOUS
  Filled 2015-01-18 (×6): qty 1

## 2015-01-18 MED ORDER — MAGNESIUM HYDROXIDE 400 MG/5ML PO SUSP: 30.0000 mL | Freq: Every day | ORAL | Status: DC | PRN

## 2015-01-18 MED ORDER — BISACODYL 10 MG RE SUPP: 10.0000 mg | Freq: Every day | RECTAL | Status: DC | PRN

## 2015-01-18 MED ORDER — FERROUS SULFATE 325 (65 FE) MG PO TABS
325.0000 mg | ORAL_TABLET | Freq: Three times a day (TID) | ORAL | Status: DC
  Administered 2015-01-18 – 2015-01-22 (×11): 325 mg via ORAL
  Filled 2015-01-18 (×9): qty 1

## 2015-01-18 MED ORDER — MAGNESIUM CITRATE PO SOLN: 1.0000 | Freq: Once | ORAL | Status: AC | PRN

## 2015-01-18 MED ORDER — PHENOL 1.4 % MT LIQD: 1.0000 | OROMUCOSAL | Status: DC | PRN

## 2015-01-18 MED ORDER — ROCURONIUM BROMIDE 100 MG/10ML IV SOLN
INTRAVENOUS | Status: DC | PRN
  Administered 2015-01-18: 50 mg via INTRAVENOUS

## 2015-01-18 MED ORDER — DEXAMETHASONE SODIUM PHOSPHATE 4 MG/ML IJ SOLN
INTRAMUSCULAR | Status: DC | PRN
  Administered 2015-01-18: 4 mg via INTRAVENOUS

## 2015-01-18 MED ORDER — POTASSIUM CHLORIDE IN NACL 20-0.9 MEQ/L-% IV SOLN
INTRAVENOUS | Status: DC
  Administered 2015-01-18 – 2015-01-20 (×3): via INTRAVENOUS
  Filled 2015-01-18 (×11): qty 1000

## 2015-01-18 MED ORDER — LACTATED RINGERS IV SOLN
INTRAVENOUS | Status: DC | PRN
  Administered 2015-01-18: 14:00:00 via INTRAVENOUS

## 2015-01-18 MED ORDER — PREGABALIN 75 MG PO CAPS
75.0000 mg | ORAL_CAPSULE | Freq: Every day | ORAL | Status: DC
  Administered 2015-01-19 – 2015-01-22 (×4): 75 mg via ORAL
  Filled 2015-01-18 (×4): qty 1

## 2015-01-18 MED ORDER — SUCCINYLCHOLINE CHLORIDE 20 MG/ML IJ SOLN
INTRAMUSCULAR | Status: AC
  Filled 2015-01-18: qty 1

## 2015-01-18 MED ORDER — SODIUM CHLORIDE 0.9 % IV SOLN
Freq: Once | INTRAVENOUS | Status: AC
  Administered 2015-01-18: 06:00:00 via INTRAVENOUS

## 2015-01-18 MED ORDER — 0.9 % SODIUM CHLORIDE (POUR BTL) OPTIME
TOPICAL | Status: DC | PRN
  Administered 2015-01-18: 1000 mL

## 2015-01-18 MED ORDER — SODIUM CHLORIDE 0.9 % IV SOLN
INTRAVENOUS | Status: DC | PRN
Start: 2015-01-18 — End: 2015-01-18
  Administered 2015-01-18: 15:00:00 via INTRAVENOUS

## 2015-01-18 MED ORDER — TIOTROPIUM BROMIDE MONOHYDRATE 18 MCG IN CAPS
18.0000 ug | ORAL_CAPSULE | Freq: Every day | RESPIRATORY_TRACT | Status: DC
  Administered 2015-01-19 – 2015-01-22 (×4): 18 ug via RESPIRATORY_TRACT
  Filled 2015-01-18: qty 5

## 2015-01-18 MED ORDER — FENTANYL CITRATE (PF) 250 MCG/5ML IJ SOLN
INTRAMUSCULAR | Status: AC
  Filled 2015-01-18: qty 5

## 2015-01-18 MED ORDER — DEXTROSE IN LACTATED RINGERS 5 % IV SOLN: INTRAVENOUS | Status: DC

## 2015-01-18 MED ORDER — METOCLOPRAMIDE HCL 5 MG PO TABS: 5.0000 mg | ORAL_TABLET | Freq: Three times a day (TID) | ORAL | Status: DC | PRN

## 2015-01-18 MED ORDER — HYDROMORPHONE HCL 1 MG/ML IJ SOLN: 0.2500 mg | INTRAMUSCULAR | Status: DC | PRN

## 2015-01-18 MED ORDER — METHOCARBAMOL 500 MG PO TABS
500.0000 mg | ORAL_TABLET | Freq: Four times a day (QID) | ORAL | Status: DC | PRN
  Administered 2015-01-18 – 2015-01-20 (×3): 500 mg via ORAL
  Filled 2015-01-18 (×3): qty 1

## 2015-01-18 MED ORDER — CLOPIDOGREL BISULFATE 75 MG PO TABS
75.0000 mg | ORAL_TABLET | Freq: Every day | ORAL | Status: DC
  Administered 2015-01-19 – 2015-01-22 (×4): 75 mg via ORAL
  Filled 2015-01-18 (×5): qty 1

## 2015-01-18 MED ORDER — ASPIRIN EC 325 MG PO TBEC
325.0000 mg | DELAYED_RELEASE_TABLET | Freq: Every day | ORAL | Status: DC
  Administered 2015-01-19 – 2015-01-22 (×4): 325 mg via ORAL
  Filled 2015-01-18 (×4): qty 1

## 2015-01-18 MED ORDER — MUPIROCIN 2 % EX OINT
1.0000 "application " | TOPICAL_OINTMENT | Freq: Two times a day (BID) | CUTANEOUS | Status: DC
  Administered 2015-01-18 – 2015-01-22 (×8): 1 via NASAL
  Filled 2015-01-18: qty 22

## 2015-01-18 MED ORDER — POTASSIUM CHLORIDE CRYS ER 20 MEQ PO TBCR
40.0000 meq | EXTENDED_RELEASE_TABLET | Freq: Once | ORAL | Status: DC
  Filled 2015-01-18: qty 2

## 2015-01-18 MED ORDER — SODIUM CHLORIDE 0.9 % IV SOLN
500.0000 mg | Freq: Two times a day (BID) | INTRAVENOUS | Status: DC
  Administered 2015-01-18 – 2015-01-19 (×3): 500 mg via INTRAVENOUS
  Filled 2015-01-18 (×4): qty 5

## 2015-01-18 MED ORDER — SODIUM CHLORIDE 0.9 % IV SOLN: INTRAVENOUS | Status: DC

## 2015-01-18 MED ORDER — PROPOFOL 10 MG/ML IV BOLUS
INTRAVENOUS | Status: AC
  Filled 2015-01-18: qty 20

## 2015-01-18 MED ORDER — SODIUM CHLORIDE 0.9 % IV BOLUS (SEPSIS)
500.0000 mL | Freq: Once | INTRAVENOUS | Status: AC
  Administered 2015-01-18: 500 mL via INTRAVENOUS

## 2015-01-18 MED ORDER — ARTIFICIAL TEARS OP OINT
TOPICAL_OINTMENT | OPHTHALMIC | Status: AC
  Filled 2015-01-18: qty 3.5

## 2015-01-18 MED ORDER — CEFAZOLIN SODIUM-DEXTROSE 2-3 GM-% IV SOLR: 2.0000 g | INTRAVENOUS | Status: DC

## 2015-01-18 MED ORDER — HYDROMORPHONE HCL 1 MG/ML IJ SOLN
0.5000 mg | Freq: Once | INTRAMUSCULAR | Status: AC
  Administered 2015-01-18: 0.5 mg via INTRAVENOUS

## 2015-01-18 MED ORDER — ACETAMINOPHEN 650 MG RE SUPP: 650.0000 mg | Freq: Four times a day (QID) | RECTAL | Status: DC | PRN

## 2015-01-18 MED ORDER — HYDROMORPHONE HCL 1 MG/ML IJ SOLN
0.5000 mg | Freq: Once | INTRAMUSCULAR | Status: AC
  Administered 2015-01-18: 0.5 mg via INTRAVENOUS
  Filled 2015-01-18: qty 1

## 2015-01-18 MED ORDER — PROPOFOL 10 MG/ML IV BOLUS
INTRAVENOUS | Status: DC | PRN
  Administered 2015-01-18: 50 mg via INTRAVENOUS

## 2015-01-18 MED ORDER — KETOROLAC TROMETHAMINE 15 MG/ML IJ SOLN
15.0000 mg | Freq: Once | INTRAMUSCULAR | Status: AC
  Administered 2015-01-18: 15 mg via INTRAVENOUS
  Filled 2015-01-18: qty 1

## 2015-01-18 MED ORDER — DOCUSATE SODIUM 100 MG PO CAPS
100.0000 mg | ORAL_CAPSULE | Freq: Two times a day (BID) | ORAL | Status: DC
  Administered 2015-01-18 – 2015-01-22 (×8): 100 mg via ORAL
  Filled 2015-01-18 (×8): qty 1

## 2015-01-18 MED ORDER — MENTHOL 3 MG MT LOZG: 1.0000 | LOZENGE | OROMUCOSAL | Status: DC | PRN

## 2015-01-18 MED ORDER — FENTANYL CITRATE (PF) 100 MCG/2ML IJ SOLN
INTRAMUSCULAR | Status: DC | PRN
Start: 2015-01-18 — End: 2015-01-18
  Administered 2015-01-18 (×2): 25 ug via INTRAVENOUS
  Administered 2015-01-18: 50 ug via INTRAVENOUS
  Administered 2015-01-18: 25 ug via INTRAVENOUS
  Administered 2015-01-18 (×2): 50 ug via INTRAVENOUS

## 2015-01-18 MED ORDER — ROCURONIUM BROMIDE 50 MG/5ML IV SOLN
INTRAVENOUS | Status: AC
  Filled 2015-01-18: qty 1

## 2015-01-18 MED ORDER — EPHEDRINE SULFATE 50 MG/ML IJ SOLN
INTRAMUSCULAR | Status: DC | PRN
  Administered 2015-01-18 (×2): 10 mg via INTRAVENOUS

## 2015-01-18 MED ORDER — ACETAMINOPHEN 500 MG PO TABS: 1000.0000 mg | ORAL_TABLET | Freq: Once | ORAL | Status: DC

## 2015-01-18 MED ORDER — LIDOCAINE HCL (CARDIAC) 20 MG/ML IV SOLN
INTRAVENOUS | Status: AC
  Filled 2015-01-18: qty 5

## 2015-01-18 MED ORDER — ONDANSETRON HCL 4 MG PO TABS: 4.0000 mg | ORAL_TABLET | Freq: Four times a day (QID) | ORAL | Status: DC | PRN

## 2015-01-18 MED ORDER — CEFAZOLIN SODIUM-DEXTROSE 2-3 GM-% IV SOLR
2.0000 g | Freq: Two times a day (BID) | INTRAVENOUS | Status: AC
  Administered 2015-01-18 – 2015-01-19 (×2): 2 g via INTRAVENOUS
  Filled 2015-01-18 (×2): qty 50

## 2015-01-18 MED ORDER — PANTOPRAZOLE SODIUM 40 MG IV SOLR
40.0000 mg | Freq: Two times a day (BID) | INTRAVENOUS | Status: DC
  Administered 2015-01-18 – 2015-01-19 (×3): 40 mg via INTRAVENOUS
  Filled 2015-01-18 (×4): qty 40

## 2015-01-18 SURGICAL SUPPLY — 72 items
BIT DRILL 110X2.5XQCK CNCT (BIT) IMPLANT
BIT DRILL 2.5 (BIT) ×3
BIT DRILL 4.3 (BIT) IMPLANT
BIT DRILL 7/64X5 DISP (BIT) ×1 IMPLANT
BIT DRILL QC 110 3.5 (BIT) ×1
BIT DRILL QC 110 3.5MM (BIT) IMPLANT
BIT DRL 110X2.5XQCK CNCT (BIT) ×1
BLADE SAGITTAL 25.0X1.27X90 (BLADE) ×1 IMPLANT
BLADE SAGITTAL 25.0X1.27X90MM (BLADE)
CABLE (Orthopedic Implant) ×4 IMPLANT
CABLE CERLAGE W/CRIMP 1.8 (Cable) ×4 IMPLANT
CABLE CERLAGE W/CRIMP 1.8MM (Cable) ×4 IMPLANT
CAP LOCK NCB (Cap) ×2 IMPLANT
CLOSURE STERI-STRIP 1/2X4 (GAUZE/BANDAGES/DRESSINGS)
CLSR STERI-STRIP ANTIMIC 1/2X4 (GAUZE/BANDAGES/DRESSINGS) ×2 IMPLANT
COVER SURGICAL LIGHT HANDLE (MISCELLANEOUS) ×3 IMPLANT
DRAPE C-ARM 42X72 X-RAY (DRAPES) ×2 IMPLANT
DRAPE C-ARMOR (DRAPES) ×2 IMPLANT
DRAPE IMP U-DRAPE 54X76 (DRAPES) ×3 IMPLANT
DRAPE ORTHO SPLIT 77X108 STRL (DRAPES) ×6
DRAPE SURG ORHT 6 SPLT 77X108 (DRAPES) ×2 IMPLANT
DRAPE U-SHAPE 47X51 STRL (DRAPES) ×3 IMPLANT
DRILL BIT 4.3 (BIT) ×3
DRILL BIT QC 110 3.5MM (BIT) ×3
DRSG MEPILEX BORDER 4X8 (GAUZE/BANDAGES/DRESSINGS) ×3 IMPLANT
DURAPREP 26ML APPLICATOR (WOUND CARE) ×3 IMPLANT
ELECT CAUTERY BLADE 6.4 (BLADE) ×3 IMPLANT
ELECT REM PT RETURN 9FT ADLT (ELECTROSURGICAL) ×3
ELECTRODE REM PT RTRN 9FT ADLT (ELECTROSURGICAL) ×1 IMPLANT
FACESHIELD WRAPAROUND (MASK) IMPLANT
FACESHIELD WRAPAROUND OR TEAM (MASK) ×1 IMPLANT
GLOVE BIO SURGEON STRL SZ7 (GLOVE) ×3 IMPLANT
GLOVE BIO SURGEON STRL SZ7.5 (GLOVE) IMPLANT
GLOVE BIOGEL PI IND STRL 7.0 (GLOVE) ×1 IMPLANT
GLOVE BIOGEL PI IND STRL 8 (GLOVE) ×1 IMPLANT
GLOVE BIOGEL PI INDICATOR 7.0 (GLOVE) ×2
GLOVE BIOGEL PI INDICATOR 8 (GLOVE) ×2
GOWN STRL REUS W/ TWL LRG LVL3 (GOWN DISPOSABLE) ×1 IMPLANT
GOWN STRL REUS W/ TWL XL LVL3 (GOWN DISPOSABLE) ×1 IMPLANT
GOWN STRL REUS W/TWL LRG LVL3 (GOWN DISPOSABLE) ×3
GOWN STRL REUS W/TWL XL LVL3 (GOWN DISPOSABLE) ×3
K-WIRE 2.0 (WIRE) ×3
K-WIRE FXSTD 280X2XNS SS (WIRE) ×1
KIT BASIN OR (CUSTOM PROCEDURE TRAY) ×3 IMPLANT
KIT ROOM TURNOVER OR (KITS) ×3 IMPLANT
KWIRE FXSTD 280X2XNS SS (WIRE) IMPLANT
LOCKPLATE CABLE BUTTON NCP HIP (Orthopedic Implant) ×6 IMPLANT
MANIFOLD NEPTUNE II (INSTRUMENTS) ×1 IMPLANT
NS IRRIG 1000ML POUR BTL (IV SOLUTION) ×3 IMPLANT
PACK TOTAL JOINT (CUSTOM PROCEDURE TRAY) ×3 IMPLANT
PACK UNIVERSAL I (CUSTOM PROCEDURE TRAY) ×3 IMPLANT
PAD ARMBOARD 7.5X6 YLW CONV (MISCELLANEOUS) ×6 IMPLANT
PILLOW ABDUCTION HIP (SOFTGOODS) ×3 IMPLANT
PLATE NCB 10H CURVED FEMUR (Plate) ×2 IMPLANT
RETRIEVER SUT HEWSON (MISCELLANEOUS) ×1 IMPLANT
SCREW 5.0 32MM (Screw) ×6 IMPLANT
SCREW CORT 2.5X30X3.5XST SM (Screw) IMPLANT
SCREW CORTICAL 3.5X26MM (Screw) ×2 IMPLANT
SCREW CORTICAL 3.5X28MM (Screw) ×2 IMPLANT
SCREW CORTICAL 3.5X30MM (Screw) ×3 IMPLANT
SCREW CORTICAL 5.0X10MM (Screw) ×4 IMPLANT
SUT FIBERWIRE #2 38 REV NDL BL (SUTURE)
SUT MNCRL AB 4-0 PS2 18 (SUTURE) ×3 IMPLANT
SUT MON AB 2-0 CT1 36 (SUTURE) ×3 IMPLANT
SUT VIC AB 1 CT1 27 (SUTURE) ×3
SUT VIC AB 1 CT1 27XBRD ANBCTR (SUTURE) ×1 IMPLANT
SUT WIRE 16GA (Orthopedic Implant) ×2 IMPLANT
SUTURE FIBERWR#2 38 REV NDL BL (SUTURE) ×2 IMPLANT
TOWEL OR 17X24 6PK STRL BLUE (TOWEL DISPOSABLE) ×3 IMPLANT
TOWEL OR 17X26 10 PK STRL BLUE (TOWEL DISPOSABLE) ×3 IMPLANT
TRAY FOLEY CATH 14FR (SET/KITS/TRAYS/PACK) IMPLANT
WATER STERILE IRR 1000ML POUR (IV SOLUTION) ×2 IMPLANT

## 2015-01-18 NOTE — ED Provider Notes (Addendum)
CSN: [id number]     Arrival date & time 01/18/15  0555 History   First MD Initiated Contact with Patient 01/18/15 [phone]     Chief Complaint  Patient presents with  . Fall     (Consider location/radiation/quality/duration/timing/severity/associated sxs/prior Treatment) HPI Comments: Pt comes in post fall. She had a mechanical fall, unwitnessed. Pt had a recent hip replacement on the R side, and she fell on to the same side. She has no other complains, specifically - no chest pain, neck pain, numbness, tingling.  The history is provided by the patient.    Past Medical History  Diagnosis Date  . Fatigue   . Anxiety   . Dizziness   . CAD (coronary artery disease)     a. Cath 2004: 70% ostial LAD, 50-60% prox ramus disease, treated medically. b. Nuc 12/2009 - EF 74%, normal study.  . Hypertension   . Hyperlipidemia   . Coronary artery disease   . Anemia     a. Chronic iron deficiency anemia  . Syncope and collapse     RECURRENT, RELATED TO HYPONATREMIA, NARCOTICS, AND BRADYCARDIA  . Tobacco abuse     PAST  . Memory disorder   . Seizure disorder   . Depression   . Chronic diastolic CHF (congestive heart failure)     a. Echo 02/2014: mild focal basal hypertrophy of septum, Normal LV function EF 55-60%; grade 1 diastolic dysfunction; mildly elevated PASP.  Marland Kitchen. GERD (gastroesophageal reflux disease)   . COPD (chronic obstructive pulmonary disease)   . CKD (chronic kidney disease), stage III    Past Surgical History  Procedure Laterality Date  . Cardiac catheterization  2004  . Abdominal hysterectomy      early 1970's for fibroid  . Hip arthroplasty Right 12/05/2014    Procedure: ARTHROPLASTY BIPOLAR HIP;  Surgeon: Jodi GeraldsJohn Graves, MD;  Location: [facility] OR;  Service: Orthopedics;  Laterality: Right;  . Orif periprosthetic fracture Right 01/18/2015    Procedure: OPEN REDUCTION INTERNAL FIXATION (ORIF) PERIPROSTHETIC FRACTURE;  Surgeon: Sheral Apleyimothy D Murphy, MD;  Location: [facility] OR;  Service:  Orthopedics;  Laterality: Right;   Family History  Problem Relation Age of Onset  . Heart attack Father   . Heart failure Mother   . CAD Father   . Congestive Heart Failure Mother   . Other Brother     cerebral hemmorhage  . Coronary artery disease Sister    History  Substance Use Topics  . Smoking status: Former Smoker -- 1.00 packs/day for 40 years    Quit date: 08/23/1999  . Smokeless tobacco: Never Used  . Alcohol Use: No   OB History    No data available     Review of Systems  Constitutional: Positive for activity change.  Respiratory: Negative for shortness of breath.   Cardiovascular: Negative for chest pain.  Gastrointestinal: Negative for nausea, vomiting and abdominal pain.  Genitourinary: Negative for dysuria.  Musculoskeletal: Positive for arthralgias. Negative for neck pain.  Neurological: Negative for headaches.  Hematological: Does not bruise/bleed easily.      Allergies  Codeine  Home Medications   Prior to Admission medications   Medication Sig Start Date End Date Taking? Authorizing Provider  acetaminophen (TYLENOL) 500 MG tablet Take 1,000 mg by mouth every 6 (six) hours as needed. For pain.   Yes Historical Provider, MD  atorvastatin (LIPITOR) 10 MG tablet Take 10 mg by mouth daily. 01/16/15  Yes Historical Provider, MD  Calcium Carbonate-Vitamin D (CALCIUM 600 + D PO)  Take 1 tablet by mouth daily.    Yes Historical Provider, MD  Cholecalciferol (VITAMIN D PO) Take 1 capsule by mouth daily.   Yes Historical Provider, MD  clopidogrel (PLAVIX) 75 MG tablet Take 75 mg by mouth daily.   Yes Historical Provider, MD  escitalopram (LEXAPRO) 10 MG tablet Take 10 mg by mouth daily.   Yes Historical Provider, MD  esomeprazole (NEXIUM) 40 MG capsule Take 40 mg by mouth every morning. Before breakfast. 11/26/14  Yes Historical Provider, MD  isosorbide mononitrate (IMDUR) 30 MG 24 hr tablet Take 30 mg by mouth daily.   Yes Historical Provider, MD  LORazepam  (ATIVAN) 0.5 MG tablet Take 0.5 mg by mouth every 6 (six) hours as needed. 01/04/15  Yes Historical Provider, MD  menthol-thymol (ABSORBINE JR) LIQD Apply 1 application topically daily as needed (shoulder pain).   Yes Historical Provider, MD  nitroGLYCERIN (NITROSTAT) 0.4 MG SL tablet Place 0.4 mg under the tongue every 5 (five) minutes as needed for chest pain.   Yes Historical Provider, MD  ondansetron (ZOFRAN ODT) 4 MG disintegrating tablet Take 1 tablet (4 mg total) by mouth every 8 (eight) hours as needed for nausea or vomiting. 08/21/14  Yes [name], DO  phenyltoloxamine-acetaminophen 30-325 MG per tablet Take 1 tablet by mouth every 4 (four) hours as needed for pain.   Yes Historical Provider, MD  pregabalin (LYRICA) 75 MG capsule Take 75 mg by mouth at bedtime. 11/27/14  Yes Historical Provider, MD  tiotropium (SPIRIVA) 18 MCG inhalation capsule Place 18 mcg into inhaler and inhale daily. 11/26/14  Yes Historical Provider, MD  aspirin EC 325 MG EC tablet Take 1 tablet (325 mg total) by mouth daily with breakfast. 01/21/15   Calvert Cantor, MD  docusate sodium (COLACE) 100 MG capsule Take 1 capsule (100 mg total) by mouth 2 (two) times daily. 01/21/15   Calvert Cantor, MD  feeding supplement, ENSURE ENLIVE, (ENSURE ENLIVE) LIQD Take 237 mLs by mouth 2 (two) times daily between meals. 01/21/15   Calvert Cantor, MD  ferrous sulfate 325 (65 FE) MG tablet Take 1 tablet (325 mg total) by mouth 3 (three) times daily with meals. 01/21/15   Calvert Cantor, MD  furosemide (LASIX) 20 MG tablet Take 1 tablet (20 mg total) by mouth daily. 01/21/15   Calvert Cantor, MD  HYDROcodone-acetaminophen (NORCO) 5-325 MG per tablet Take 1-2 tablets by mouth every 6 (six) hours as needed for moderate pain. 01/22/15   Brittney Tresa Endo, PA-C  levETIRAcetam (KEPPRA) 750 MG tablet Take 1 tablet (750 mg total) by mouth every 12 (twelve) hours. 01/22/15   Calvert Cantor, MD   BP 145/52 mmHg  Pulse 79  Temp(Src) 99 F (37.2 C) (Oral)  Resp 16   Ht  (1.6 m)  Wt 121 lb (54.885 kg)  BMI 21.44 kg/m2  SpO2 98% Physical Exam  Constitutional: She is oriented to person, place, and time. She appears well-developed and well-nourished.  HENT:  Head: Normocephalic and atraumatic.  Eyes: EOM are normal. Pupils are equal, round, and reactive to light.  Neck: Neck supple.  No midline c-spine tenderness  Cardiovascular: Normal rate and regular rhythm.   No murmur heard. Pulmonary/Chest: Effort normal and breath sounds normal. No respiratory distress. She exhibits no tenderness.  Abdominal: Soft. Bowel sounds are normal. She exhibits no distension. There is no tenderness.  Musculoskeletal:  Pt with R hip tenderness and gross deformity.  No long bone tenderness - upper and lower extrmeities and no  pelvic pain, instability.  Neurological: She is alert and oriented to person, place, and time. No cranial nerve deficit.  Skin: Skin is warm and dry. No rash noted.  Nursing note and vitals reviewed.   ED Course  Procedures (including critical care time) Labs Review Labs Reviewed  SURGICAL PCR SCREEN - Abnormal; Notable for the following:    MRSA, PCR POSITIVE (*)    Staphylococcus aureus POSITIVE (*)    All other components within normal limits  CBC WITH DIFFERENTIAL/PLATELET - Abnormal; Notable for the following:    WBC 10.8 (*)    RBC 3.65 (*)    Hemoglobin 11.1 (*)    HCT 33.5 (*)    Monocytes Absolute 1.1 (*)    All other components within normal limits  BASIC METABOLIC PANEL - Abnormal; Notable for the following:    Sodium 133 (*)    Potassium 3.1 (*)    Chloride 98 (*)    Glucose, Bld 123 (*)    BUN 27 (*)    Creatinine, Ser 1.53 (*)    Calcium 8.4 (*)    GFR calc non Af Amer 30 (*)    GFR calc Af Amer 34 (*)    All other components within normal limits  IRON AND TIBC - Abnormal; Notable for the following:    TIBC 224 (*)    All other components within normal limits  FERRITIN - Abnormal; Notable for the following:     Ferritin 530 (*)    All other components within normal limits  RETICULOCYTES - Abnormal; Notable for the following:    RBC. 3.35 (*)    All other components within normal limits  URINALYSIS, ROUTINE W REFLEX MICROSCOPIC (NOT AT [facility]) - Abnormal; Notable for the following:    Leukocytes, UA SMALL (*)    All other components within normal limits  CALCIUM - Abnormal; Notable for the following:    Calcium 8.3 (*)    All other components within normal limits  URINE MICROSCOPIC-ADD ON - Abnormal; Notable for the following:    Squamous Epithelial / LPF MANY (*)    Bacteria, UA FEW (*)    All other components within normal limits  BASIC METABOLIC PANEL - Abnormal; Notable for the following:    Glucose, Bld 113 (*)    Calcium 8.5 (*)    All other components within normal limits  CBC - Abnormal; Notable for the following:    RBC 2.68 (*)    Hemoglobin 8.2 (*)    HCT 24.8 (*)    RDW 15.8 (*)    All other components within normal limits  POCT I-STAT 4, (NA,K, GLUC, HGB,HCT) - Abnormal; Notable for the following:    Potassium 3.4 (*)    Glucose, Bld 137 (*)    HCT 24.0 (*)    Hemoglobin 8.2 (*)    All other components within normal limits  POCT I-STAT 4, (NA,K, GLUC, HGB,HCT) - Abnormal; Notable for the following:    Potassium 3.4 (*)    Glucose, Bld 159 (*)    HCT 27.0 (*)    Hemoglobin 9.2 (*)    All other components within normal limits  VITAMIN B12  FOLATE  TSH  PROTIME-INR  ALBUMIN  VITAMIN D 25 HYDROXY  TYPE AND SCREEN  PREPARE RBC (CROSSMATCH)    Imaging Review No results found.   EKG Interpretation   Date/Time:  Sunday Jan 18 2015 06:19:59 EDT Ventricular Rate:  84 PR Interval:  156 QRS Duration: 71 QT  Interval:  391 QTC Calculation: 462 R Axis:   20 Text Interpretation:  Sinus rhythm Non-specific abnormality, ST segment,  and/or T-wave No significant change since last tracing Confirmed by  Rhunette Croft, MD, Nieves Barberi [phone]) on 01/18/2015 7:44:21 AM      MDM    Final diagnoses:  Fall at home, initial encounter  Closed femur fracture, right, initial encounter    Pt with fall. Recent hip replacement - R side by [facility]. She has femur fx around the hardware - spoke with Dr. Yevette Edwards, Orthopedics. He is requesting [place] to keep the patient npo for now, and they will decide the next step. I have asked them to inform medicine team about the surgical plans. Pt is neurovascularly intact.    Derwood Kaplan, MD 01/18/15 1914  Derwood Kaplan, MD 01/28/15 [phone]

## 2015-01-18 NOTE — Anesthesia Postprocedure Evaluation (Signed)
  Anesthesia Post-op Note  Patient: [name] [name]  Procedure(s) Performed: Procedure(s): OPEN REDUCTION INTERNAL FIXATION (ORIF) PERIPROSTHETIC FRACTURE (Right)  Patient Location: PACU  Anesthesia Type:General  Level of Consciousness: awake and alert   Airway and Oxygen Therapy: Patient Spontanous Breathing  Post-op Pain: none  Post-op Assessment: Post-op Vital signs reviewed, Patient's Cardiovascular Status Stable and Respiratory Function Stable  Post-op Vital Signs: Reviewed  Filed Vitals:   01/18/15 1714  BP: 100/51  Pulse: 96  Temp:   Resp: 15    Complications: No apparent anesthesia complications

## 2015-01-18 NOTE — Anesthesia Procedure Notes (Signed)
Procedure Name: Intubation Date/Time: 01/18/2015 1:58 PM Performed by: Fransisca KaufmannMEYER, Yovanni Frenette E Pre-anesthesia Checklist: Patient identified, Emergency Drugs available, Suction available, Patient being monitored and Timeout performed Patient Re-evaluated:Patient Re-evaluated prior to inductionOxygen Delivery Method: Circle system utilized Preoxygenation: Pre-oxygenation with 100% oxygen Intubation Type: IV induction Ventilation: Mask ventilation without difficulty Laryngoscope Size: Mac and 3 Grade View: Grade I Tube type: Oral Tube size: 7.5 mm Airway Equipment and Method: Stylet Placement Confirmation: ETT inserted through vocal cords under direct vision,  positive ETCO2 and breath sounds checked- equal and bilateral Secured at: 22 cm Tube secured with: Tape Dental Injury: Teeth and Oropharynx as per pre-operative assessment

## 2015-01-18 NOTE — ED Notes (Signed)
Unwitnessed fall, pt had a recent right hip replacement last month, partial just replaced ball/head. Pt trying to go to the bathroom. Falling a lot recently lives at home alone. A&Ox4. of fent. Given per EMS

## 2015-01-18 NOTE — Progress Notes (Signed)
Orthopedic Tech Progress Note Patient Details:  [name] [name] 05/23/1927 [id number]  Musculoskeletal Traction Type of Traction: Bucks Skin Traction Traction Location: rle Traction Weight: 7 lbs    Nikki DomCrawford, Cristalle Rohm 01/18/2015, 10:01 AM

## 2015-01-18 NOTE — Consult Note (Signed)
ORTHOPAEDIC CONSULTATION  REQUESTING PHYSICIAN: Albertine Patricia, MD  Chief Complaint: R femur fracture, periprosthetic  HPI: [name] [name] is a 81 y.o. female who underwent a right hip hemiarthroplasty for fracture with Dr. Berenice Primas on 4/17. She recovered well from this but suffered a mechanical fall today on the right side. She complains of pain at the hip. Dr. Gildardo Pounds was on-call for [place] and he is called me and asked me to assist in the care of this patient.  I spoke with Dr. Waldron Labs who did not feel that any additional workup was necessary. She is at moderate surgical risk.  Past Medical History  Diagnosis Date  . Fatigue   . Anxiety   . Dizziness   . CAD (coronary artery disease)     a. Cath 2004: 70% ostial LAD, 50-60% prox ramus disease, treated medically. b. Nuc 12/2009 - EF 74%, normal study.  . Hypertension   . Hyperlipidemia   . Coronary artery disease   . Anemia     a. Chronic iron deficiency anemia  . Syncope and collapse     RECURRENT, RELATED TO HYPONATREMIA, NARCOTICS, AND BRADYCARDIA  . Tobacco abuse     PAST  . Memory disorder   . Seizure disorder   . Depression   . Chronic diastolic CHF (congestive heart failure)     a. Echo 02/2014: mild focal basal hypertrophy of septum, Normal LV function EF 65-03%; grade 1 diastolic dysfunction; mildly elevated PASP.  Marland Kitchen GERD (gastroesophageal reflux disease)   . COPD (chronic obstructive pulmonary disease)   . CKD (chronic kidney disease), stage III    Past Surgical History  Procedure Laterality Date  . Cardiac catheterization  2004  . Abdominal hysterectomy      early 1970's for fibroid  . Hip arthroplasty Right 12/05/2014    Procedure: ARTHROPLASTY BIPOLAR HIP;  Surgeon: Dorna Leitz, MD;  Location: [place];  Service: Orthopedics;  Laterality: Right;   History   Social History  . Marital Status: Widowed    Spouse Name: N/A  . Number of Children: N/A  . Years of Education: N/A   Social  History Main Topics  . Smoking status: Former Smoker -- 1.00 packs/day for 40 years    Quit date: 08/23/1999  . Smokeless tobacco: Never Used  . Alcohol Use: No  . Drug Use: No  . Sexual Activity: Not on file   Other Topics Concern  . 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 Heart attack Father   . Heart failure Mother   . CAD Father   . Congestive Heart Failure Mother   . Other Brother     cerebral hemmorhage  . Coronary artery disease Sister    Allergies  Allergen Reactions  . Codeine Nausea And Vomiting   Prior to Admission medications   Medication Sig Start Date End Date Taking? Authorizing Provider  acetaminophen (TYLENOL) 500 MG tablet Take 1,000 mg by mouth every 6 (six) hours as needed. For pain.    Historical Provider, MD  Calcium Carbonate-Vitamin D (CALCIUM 600 + D PO) Take 1 tablet by mouth daily.     Historical Provider, MD  clopidogrel (PLAVIX) 75 MG tablet Take 75 mg by mouth daily.    Historical Provider, MD  escitalopram (LEXAPRO) 10 MG tablet Take 10 mg by mouth daily.    Historical Provider, MD  esomeprazole (NEXIUM) 40 MG capsule Take 40 mg by mouth every morning. Before breakfast. 11/26/14   Historical Provider, MD  ferrous sulfate 325 (65 FE) MG tablet Take 1 tablet (325 mg total) by mouth daily with breakfast. 12/09/14   Velvet Bathe, MD  furosemide (LASIX) 20 MG tablet Take 20 mg by mouth 2 (two) times daily.    Historical Provider, MD  isosorbide mononitrate (IMDUR) 30 MG 24 hr tablet Take 30 mg by mouth daily.    Historical Provider, MD  levETIRAcetam (KEPPRA) 500 MG tablet Take 750 mg by mouth every 12 (twelve) hours.  06/28/12   Rebecca S Tat, DO  menthol-thymol (ABSORBINE JR) LIQD Apply 1 application topically daily as needed (shoulder pain).    Historical Provider, MD  nitroGLYCERIN (NITROSTAT) 0.4 MG SL tablet Place 0.4 mg under the tongue every 5 (five) minutes as needed  for chest pain.    Historical Provider, MD  ondansetron (ZOFRAN ODT) 4 MG disintegrating tablet Take 1 tablet (4 mg total) by mouth every 8 (eight) hours as needed for nausea or vomiting. 08/21/14   [name], DO  pravastatin (PRAVACHOL) 40 MG tablet Take 40 mg by mouth daily. 11/26/14   Historical Provider, MD  pregabalin (LYRICA) 75 MG capsule Take 75 mg by mouth at bedtime. 11/27/14   Historical Provider, MD  tiotropium (SPIRIVA) 18 MCG inhalation capsule Place 18 mcg into inhaler and inhale daily. 11/26/14   Historical Provider, MD  traMADol Veatrice Bourbon) 50 MG tablet Take two tablets by mouth every 4 hours as needed for severe pain 12/16/14   Estill Dooms, MD   Dg Chest 1 View  01/18/2015   CLINICAL DATA:  Preop chest x-ray, femur fracture.  EXAM: CHEST  1 VIEW  COMPARISON:  12/05/2014  FINDINGS: Patient is moderately rotated to the right. Lungs are adequately inflated without consolidation or effusion. Cardiomediastinal silhouette is within normal. Remainder the exam is unchanged to include a lower thoracic spine compression fracture post kyphoplasty.  IMPRESSION: No active disease.   Electronically Signed   By: Marin Olp M.D.   On: 01/18/2015 07:33   Dg Pelvis 1-2 Views  01/18/2015   CLINICAL DATA:  Unwitnessed fall. Recent right hip replacement last month.  EXAM: PELVIS - 1-2 VIEW  COMPARISON:  12/05/2014  FINDINGS: Right unipolar hip arthroplasty intact. Subtle focal irregularity over the right greater trochanter likely postsurgical. Mild degenerative change of the left hip. Degenerative changes of the spine. No acute fracture or dislocation.  IMPRESSION: No acute findings.   Electronically Signed   By: Marin Olp M.D.   On: 01/18/2015 07:36   Dg Femur, Min 2 Views Right  01/18/2015   CLINICAL DATA:  Fall.  Recent right hip replacement.  EXAM: RIGHT FEMUR 2 VIEWS  COMPARISON:  12/05/2014  FINDINGS: Unipolar right hip arthroplasty is intact and unchanged. There is a oblique mid diaphyseal  fracture with approximately 1 shaft's with of lateral displacement of the distal fragment and medial angulation of the distal fragment. Remainder of the exam is unremarkable.  IMPRESSION: Displaced oblique fracture of the right mid femoral diaphysis.   Electronically Signed   By: Marin Olp M.D.   On: 01/18/2015 07:32    Positive ROS: All other systems have been reviewed and were otherwise negative with the exception of those mentioned in the HPI and as above.  Labs cbc  Recent Labs  01/18/15 0623  WBC 10.8*  HGB 11.1*  HCT 33.5*  PLT 355  Labs inflam No results for input(s): CRP in the last 72 hours.  Invalid input(s): ESR  Labs coag  Recent Labs  01/18/15 0750  INR 1.10     Recent Labs  01/18/15 0623  NA 133*  K 3.1*  CL 98*  CO2 22  GLUCOSE 123*  BUN 27*  CREATININE 1.53*  CALCIUM 8.4*    Physical Exam: Filed Vitals:   01/18/15 0906  BP: 128/59  Pulse: 78  Temp: 99.2 F (37.3 C)  Resp: 22   General: Alert, no acute distress Cardiovascular: No pedal edema Respiratory: No cyanosis, no use of accessory musculature GI: No organomegaly, abdomen is soft and non-tender Skin: No lesions in the area of chief complaint other than those listed below in MSK exam.  Neurologic: Sensation intact distally Psychiatric: Patient is competent for consent with normal mood and affect Lymphatic: No axillary or cervical lymphadenopathy  MUSCULOSKELETAL:  RLE: Compartments are soft surgical incisions are well-healed she is able to wiggle her toes and has sensation distally. Other extremities are atraumatic with painless ROM and NVI.  Assessment: Right periprosthetic femur fracture stable implant.  Plan: I spoke with the patient and her family we discussed the risks associated with this surgery they would like to go forward with operative fixation. She is on aspirin I did discuss the risk of bleeding but I do think he be appropriate to go forward with this surgery  today they are in agreement with this and would like to go forward as soon as possible.  ORIF today   Renette Butters, MD Cell [phone]   01/18/2015 10:48 AM

## 2015-01-18 NOTE — Op Note (Signed)
01/18/2015  4:06 PM  PATIENT:  [name] [name]    PRE-OPERATIVE DIAGNOSIS:  right hip peri-prosthetic fracture  POST-OPERATIVE DIAGNOSIS:  Same  PROCEDURE:  OPEN REDUCTION INTERNAL FIXATION (ORIF) PERIPROSTHETIC FRACTURE  SURGEON:  Analie Katzman, Jewel BaizeIMOTHY D, MD  ASSISTANT: Janace LittenBrandon Parry, OPA-C, present and scrubbed throughout the case, critical for completion in a timely fashion, and for retraction, instrumentation, and closure.   ANESTHESIA:   gen  PREOPERATIVE INDICATIONS:  [name] [name] is a  81 y.o. female with a diagnosis of right hip peri-prosthetic fracture who failed conservative measures and elected for surgical management.    The risks benefits and alternatives were discussed with the patient preoperatively including but not limited to the risks of infection, bleeding, nerve injury, cardiopulmonary complications, the need for revision surgery, among others, and the patient was willing to proceed.  OPERATIVE IMPLANTS: biomet lat proximal femur plate  OPERATIVE FINDINGS: stable stem.   BLOOD LOSS: 500  COMPLICATIONS: none  TOURNIQUET TIME: none  OPERATIVE PROCEDURE:  Patient was identified in the preoperative holding area and site was marked by me She was transported to the operating theater and placed on the table in supine position taking care to pad all bony prominences. After a preincinduction time out anesthesia was induced. The right lower extremity was prepped and draped in normal sterile fashion and a pre-incision timeout was performed. She received ancef for preoperative antibiotics.   I took a closer inspection of her wound. She appeared to have a stitch abscess that recently drained. There is no palpable fluctuance around this there is minimal surrounding redness there was no active drainage. I elected to cover this and go forward with fracture management. Of note once we were down deep there is no purulent fluid found around the stem or proximal.  I made a lateral approach  to her fracture site. I incised the IT band longitudinally and identified her fracture site. The tip of the stem was visible it did not move independently from the proximal piece there was a posterior butterfly piece distally.  I performed a provisional reduction and placed 2 lag screws in the distal shaft and through the butterfly piece as happy with this reduction I then clamped around the stem and the 2 bone pieces of affecting a provisional reduction and placed an 18-gauge wire around this to hold our reduction. Next I selected a lateral anatomic plate selected location for this plate that allowed me 3 cables around the proximal portion with one Snow of nose locking screw and 3 bicortical screws distally. I provisionally fixed this plate in place took x-rays as happy with its position I placed the above-mentioned hardware and then also a table around the central portion of the fracture just distal to the stem as well. As very happy with the purchase of everything I took multiple x-rays confirming fracture reduction and placement of all hardware.   All the distal bicortical screws had excellent purchase so elected not to place locking Any of These I Did Place a Locking Cap on the Proximal Short Screw.   I Thoroughly Irrigated Her Wound I then closed her IT band the soft tissue in layers finishing with a nylon on this on the skin. Sterile dressing was applied followed by separate dressing after uncovering that stitch abscess. She was taking the PACU in stable condition.  POST OPERATIVE PLAN: NWB RLE, ASA for dvt px.     This note was generated using a template and dragon dictation system. In light  of that, I have reviewed the note and all aspects of it are applicable to this case. Any dictation errors are due to the computerized dictation system.

## 2015-01-18 NOTE — Progress Notes (Signed)
Orthopedic Tech Progress Note Patient Details:  [name] [name] [birth year] [id number]  Patient ID: [name] [name], female   DOB: [birth year], 81 y.o.   MRN: [id number] Pt unable to use trapeze bar patient helper  Nikki DomCrawford, Mahmoud Blazejewski 01/18/2015, 9:32 AM

## 2015-01-18 NOTE — H&P (Signed)
[facility] History and Physical                                                                                    [name] [name], is a 81 y.o. female  MRN: [id number]   DOB - [birth year]  Admit Date - 01/18/2015  Outpatient Primary MD for the patient is [name], MD  Referring MD: Rhunette Croft / ER  Consulting MD: Yevette Edwards / Orthopedics  With History of -  Past Medical History  Diagnosis Date  . Fatigue   . Anxiety   . Dizziness   . CAD (coronary artery disease)     a. Cath 2004: 70% ostial LAD, 50-60% prox ramus disease, treated medically. b. Nuc 12/2009 - EF 74%, normal study.  . Hypertension   . Hyperlipidemia   . Coronary artery disease   . Anemia     a. Chronic iron deficiency anemia  . Syncope and collapse     RECURRENT, RELATED TO HYPONATREMIA, NARCOTICS, AND BRADYCARDIA  . Tobacco abuse     PAST  . Memory disorder   . Seizure disorder   . Depression   . Chronic diastolic CHF (congestive heart failure)     a. Echo 02/2014: mild focal basal hypertrophy of septum, Normal LV function EF 55-60%; grade 1 diastolic dysfunction; mildly elevated PASP.  Marland Kitchen GERD (gastroesophageal reflux disease)   . COPD (chronic obstructive pulmonary disease)   . CKD (chronic kidney disease), stage III       Past Surgical History  Procedure Laterality Date  . Cardiac catheterization  2004  . Abdominal hysterectomy      early 1970's for fibroid  . Hip arthroplasty Right 12/05/2014    Procedure: ARTHROPLASTY BIPOLAR HIP;  Surgeon: Jodi Geralds, MD;  Location: [facility];  Service: Orthopedics;  Laterality: Right;    in for   Chief Complaint  Patient presents with  . Fall     HPI This is a 81 yo female recent dc to SNF fro rehab 4/19 after admit for fall and right hip fracture. Now back home. With sx's c/w FTT; 40 lb weight loss x 2 yrs, progressive decline with variable oral intake (prefers coffee over Ensure), diffuse myalgias that have responded to Lyrica. Went to [facility] during  middle of night and fell. Pt recalls falling but doesn't think she tripped or passed out (h/o orthostasis). No recent N/V/D or blood in urine or stool. In ER found to have femur fracture below right hip appliance. Having significant pain. Appears dehydrated w/ Na 27, Cr 1.53, elevated Hgb for pt of 11.1 (10). PCXR unremarkable.   Review of Systems   In addition to the HPI above,  No Fever-chills, myalgias or other constitutional symptoms No Headache, changes with Vision or hearing, new weakness, tingling, numbness in any extremity, No problems swallowing food or Liquids, indigestion/reflux No Chest pain, Cough or Shortness of Breath, palpitations, orthopnea or DOE No Abdominal pain, N/V; no melena or hematochezia, no dark tarry stools, Bowel movements are regular, No dysuria, hematuria or flank pain No new skin rashes, lesions, masses or bruises, No new joints pains-aches No recent weight gain or loss No polyuria, polydypsia or  polyphagia,  *A full 10 point Review of Systems was done, except as stated above, all other Review of Systems were negative.  Social History History  Substance Use Topics  . Smoking status: Former Smoker -- 1.00 packs/day for 40 years    Quit date: 08/23/1999  . Smokeless tobacco: Never Used  . Alcohol Use: No    Resides at: Private residence  Lives with: Sister  Ambulatory status: w/o assistive devices prior to admission   Family History Family History  Problem Relation Age of Onset  . Heart attack Father   . Heart failure Mother   . CAD Father   . Congestive Heart Failure Mother   . Other Brother     cerebral hemmorhage  . Coronary artery disease Sister      Prior to Admission medications   Medication Sig Start Date End Date Taking? Authorizing Provider  acetaminophen (TYLENOL) 500 MG tablet Take 1,000 mg by mouth every 6 (six) hours as needed. For pain.    Historical Provider, MD  Calcium Carbonate-Vitamin D (CALCIUM 600 + D PO) Take 1  tablet by mouth daily.     Historical Provider, MD  clopidogrel (PLAVIX) 75 MG tablet Take 75 mg by mouth daily.    Historical Provider, MD  escitalopram (LEXAPRO) 10 MG tablet Take 10 mg by mouth daily.    Historical Provider, MD  esomeprazole (NEXIUM) 40 MG capsule Take 40 mg by mouth every morning. Before breakfast. 11/26/14   Historical Provider, MD  ferrous sulfate 325 (65 FE) MG tablet Take 1 tablet (325 mg total) by mouth daily with breakfast. 12/09/14   Penny Pia, MD  furosemide (LASIX) 20 MG tablet Take 20 mg by mouth 2 (two) times daily.    Historical Provider, MD  isosorbide mononitrate (IMDUR) 30 MG 24 hr tablet Take 30 mg by mouth daily.    Historical Provider, MD  levETIRAcetam (KEPPRA) 500 MG tablet Take 750 mg by mouth every 12 (twelve) hours.  06/28/12   Rebecca S Tat, DO  menthol-thymol (ABSORBINE JR) LIQD Apply 1 application topically daily as needed (shoulder pain).    Historical Provider, MD  nitroGLYCERIN (NITROSTAT) 0.4 MG SL tablet Place 0.4 mg under the tongue every 5 (five) minutes as needed for chest pain.    Historical Provider, MD  ondansetron (ZOFRAN ODT) 4 MG disintegrating tablet Take 1 tablet (4 mg total) by mouth every 8 (eight) hours as needed for nausea or vomiting. 08/21/14   [name], DO  pravastatin (PRAVACHOL) 40 MG tablet Take 40 mg by mouth daily. 11/26/14   Historical Provider, MD  pregabalin (LYRICA) 75 MG capsule Take 75 mg by mouth at bedtime. 11/27/14   Historical Provider, MD  tiotropium (SPIRIVA) 18 MCG inhalation capsule Place 18 mcg into inhaler and inhale daily. 11/26/14   Historical Provider, MD  traMADol Janean Sark) 50 MG tablet Take two tablets by mouth every 4 hours as needed for severe pain 12/16/14   Kimber Relic, MD    Allergies  Allergen Reactions  . Codeine Nausea And Vomiting    Physical Exam  Vitals  Blood pressure 107/88, pulse 89, temperature 98.5 F (36.9 C), temperature source Oral, resp. rate 17, height  (1.6 m), weight  121 lb (54.885 kg), SpO2 95 %.   General:  In no acute distress, appears malnourished  Psych:  Normal affect, Denies Suicidal or Homicidal ideations, Awake Alert, Oriented X 3. Speech and thought patterns are clear and appropriate, no apparent short term memory  deficits  Neuro:   No focal neurological deficits, CN II through XII intact, Strength 5/5 all 4 extremities, Sensation intact all 4 extremities.  ENT:  Ears and Eyes appear Normal, Conjunctivae clear, PER. Moist oral mucosa without erythema or exudates.  Neck:  Supple, No lymphadenopathy appreciated  Respiratory:  Symmetrical chest wall movement, Good air movement bilaterally, CTAB. Room Air  Cardiac:  RRR, No Murmurs, no LE edema noted, no JVD, No carotid bruits, peripheral pulses palpable at 2+  Abdomen:  Positive bowel sounds, Soft, Non tender, Non distended,  No masses appreciated, no obvious hepatosplenomegaly  Skin:  No Cyanosis, poor Skin Turgor, No Skin Rash or Bruise.  Extremities: RLE shorter than LLE and TTP over right thigh area  Data Review  CBC  Recent Labs Lab 01/18/15 0623  WBC 10.8*  HGB 11.1*  HCT 33.5*  PLT 355  MCV 91.8  MCH 30.4  MCHC 33.1  RDW 14.1  LYMPHSABS 3.1  MONOABS 1.1*  EOSABS 0.1  BASOSABS 0.0    Chemistries   Recent Labs Lab 01/18/15 0623  NA 133*  K 3.1*  CL 98*  CO2 22  GLUCOSE 123*  BUN 27*  CREATININE 1.53*  CALCIUM 8.4*    estimated creatinine clearance is 21.8 mL/min (by C-G formula based on Cr of 1.53).  No results for input(s): TSH, T4TOTAL, T3FREE, THYROIDAB in the last 72 hours.  Invalid input(s): FREET3  Coagulation profile No results for input(s): INR, PROTIME in the last 168 hours.  No results for input(s): DDIMER in the last 72 hours.  Cardiac Enzymes No results for input(s): CKMB, TROPONINI, MYOGLOBIN in the last 168 hours.  Invalid input(s): CK  Invalid input(s): POCBNP  Urinalysis    Component Value Date/Time   COLORURINE YELLOW  12/06/2014 1408   APPEARANCEUR CLEAR 12/06/2014 1408   LABSPEC 1.011 12/06/2014 1408   PHURINE 5.0 12/06/2014 1408   GLUCOSEU NEGATIVE 12/06/2014 1408   HGBUR TRACE* 12/06/2014 1408   BILIRUBINUR NEGATIVE 12/06/2014 1408   BILIRUBINUR neg 01/16/2014 2050   KETONESUR NEGATIVE 12/06/2014 1408   PROTEINUR NEGATIVE 12/06/2014 1408   PROTEINUR neg 01/16/2014 2050   UROBILINOGEN 0.2 12/06/2014 1408   UROBILINOGEN 0.2 01/16/2014 2050   NITRITE NEGATIVE 12/06/2014 1408   NITRITE neg 01/16/2014 2050   LEUKOCYTESUR NEGATIVE 12/06/2014 1408    Imaging results:   Dg Chest 1 View  01/18/2015   CLINICAL DATA:  Preop chest x-ray, femur fracture.  EXAM: CHEST  1 VIEW  COMPARISON:  12/05/2014  FINDINGS: Patient is moderately rotated to the right. Lungs are adequately inflated without consolidation or effusion. Cardiomediastinal silhouette is within normal. Remainder the exam is unchanged to include a lower thoracic spine compression fracture post kyphoplasty.  IMPRESSION: No active disease.   Electronically Signed   By: Elberta Fortisaniel  Boyle M.D.   On: 01/18/2015 07:33   Dg Pelvis 1-2 Views  01/18/2015   CLINICAL DATA:  Unwitnessed fall. Recent right hip replacement last month.  EXAM: PELVIS - 1-2 VIEW  COMPARISON:  12/05/2014  FINDINGS: Right unipolar hip arthroplasty intact. Subtle focal irregularity over the right greater trochanter likely postsurgical. Mild degenerative change of the left hip. Degenerative changes of the spine. No acute fracture or dislocation.  IMPRESSION: No acute findings.   Electronically Signed   By: Elberta Fortisaniel  Boyle M.D.   On: 01/18/2015 07:36   Dg Femur, Min 2 Views Right  01/18/2015   CLINICAL DATA:  Fall.  Recent right hip replacement.  EXAM: RIGHT FEMUR 2 VIEWS  COMPARISON:  12/05/2014  FINDINGS: Unipolar right hip arthroplasty is intact and unchanged. There is a oblique mid diaphyseal fracture with approximately 1 shaft's with of lateral displacement of the distal fragment and medial  angulation of the distal fragment. Remainder of the exam is unremarkable.  IMPRESSION: Displaced oblique fracture of the right mid femoral diaphysis.   Electronically Signed   By: Elberta Fortis M.D.   On: 01/18/2015 07:32     EKG: (Independently reviewed) sinus rhythm, QTC 453 ms, ST-T wave without ischemic changes   Assessment & Plan  Principal Problem:   Femur fracture/recurrent falls -admit to floor -Ortho to see- surgery timing per ortho-NPO -tx pain (Dilaudid, 1x Toradol, Robaxin) -place foley and ck UA/cx -ck Vit D/Ca -BR -SNF at dc-PT/OT post op  Active Problems:   ARF on CKD 3 -likely from lasix in combo poor intake at home -DC Lasix and hydrate -baseline 9/0.99    Dehydration with hyponatremia/hypokalemia -from preadmit diuretics, antiHTN meds and poor intake -dropped BP w/ pain meds so give 500 cc NS bolus then NS w/KCL fluids at 125 /hr    Seizure disorder -cont Keppra but convert to IV while NPO    Hypertension -Cautiously cont Imdur in setting of DH    Anemia -mildly hemoconcentrated in setting DH -ck TSH and anemia panel -on Iron preadmit    Diastolic CHF, chronic -compensated -given underlying FTT and poor PO intake would NOT resume Lasix    FTT (failure to thrive) in adult/severe PCM -Nutrition consult -resume protein supplements once diet advanced      Hyperlipidemia -cont Pravachol -has diffuse myalgias preadmit that apparently are not r/t statin so cont Lyrica    Coronary artery disease -cont Plavix    GERD  -therapeutic substitution for Mexium     DVT Prophylaxis: Subcutaneous heparin  Family Communication:   Sister at bedside  Code Status:  Full code  Condition:  Stable  Discharge disposition: Anticipate discharge to rehabilitation therapy postoperatively prior to return to home environment  Time spent in minutes : 60      ELLIS,ALLISON L. ANP on 01/18/2015 at 8:08 AM  Between 7am to 7pm - Pager - [phone]  After  7pm go to [email or web address] - password [email or web address]  And look for the night coverage person covering me after hours  [facility]

## 2015-01-18 NOTE — Transfer of Care (Signed)
Immediate Anesthesia Transfer of Care Note  Patient: [name] [name]  Procedure(s) Performed: Procedure(s): OPEN REDUCTION INTERNAL FIXATION (ORIF) PERIPROSTHETIC FRACTURE (Right)  Patient Location: PACU  Anesthesia Type:General  Level of Consciousness: awake  Airway & Oxygen Therapy: Patient Spontanous Breathing and Patient connected to nasal cannula oxygen  Post-op Assessment: Report given to RN and Post -op Vital signs reviewed and stable  Post vital signs: Reviewed and stable  Last Vitals:  Filed Vitals:   01/18/15 1248  BP: 99/51  Pulse: 84  Temp: 36.8 C  Resp: 20    Complications: No apparent anesthesia complications

## 2015-01-18 NOTE — Anesthesia Preprocedure Evaluation (Addendum)
Anesthesia Evaluation  Patient identified by MRN, date of birth, ID band Patient awake    Reviewed: Allergy & Precautions, H&P , NPO status , Patient's Chart, lab work & pertinent test results  Airway Mallampati: I  TM Distance: >3 FB Neck ROM: Full    Dental no notable dental hx. (+) Edentulous Upper, Edentulous Lower, Dental Advisory Given   Pulmonary COPD COPD inhaler, former smoker,  breath sounds clear to auscultation  Pulmonary exam normal       Cardiovascular hypertension, Pt. on medications + CAD and +CHF Rhythm:Regular Rate:Normal     Neuro/Psych Anxiety Depression negative neurological ROS     GI/Hepatic Neg liver ROS, GERD-  Medicated and Controlled,  Endo/Other  negative endocrine ROS  Renal/GU Renal InsufficiencyRenal disease  negative genitourinary   Musculoskeletal   Abdominal   Peds  Hematology negative hematology ROS (+)   Anesthesia Other Findings   Reproductive/Obstetrics negative OB ROS                            Anesthesia Physical Anesthesia Plan  ASA: III  Anesthesia Plan: General   Post-op Pain Management:    Induction: Intravenous  Airway Management Planned: Oral ETT  Additional Equipment:   Intra-op Plan:   Post-operative Plan: Extubation in OR  Informed Consent: I have reviewed the patients History and Physical, chart, labs and discussed the procedure including the risks, benefits and alternatives for the proposed anesthesia with the patient or authorized representative who has indicated his/her understanding and acceptance.   Dental advisory given  Plan Discussed with: CRNA  Anesthesia Plan Comments:         Anesthesia Quick Evaluation

## 2015-01-19 DIAGNOSIS — E440 Moderate protein-calorie malnutrition: Secondary | ICD-10-CM | POA: Insufficient documentation

## 2015-01-19 LAB — TYPE AND SCREEN
ABO/RH(D): O POS
Antibody Screen: NEGATIVE
UNIT DIVISION: 0
Unit division: 0

## 2015-01-19 LAB — VITAMIN D 25 HYDROXY (VIT D DEFICIENCY, FRACTURES): Vit D, 25-Hydroxy: 30.6 ng/mL (ref 30.0–100.0)

## 2015-01-19 MED ORDER — PANTOPRAZOLE SODIUM 40 MG PO TBEC
40.0000 mg | DELAYED_RELEASE_TABLET | Freq: Two times a day (BID) | ORAL | Status: DC
  Administered 2015-01-19 – 2015-01-22 (×6): 40 mg via ORAL
  Filled 2015-01-19 (×6): qty 1

## 2015-01-19 MED ORDER — ENSURE ENLIVE PO LIQD
237.0000 mL | Freq: Two times a day (BID) | ORAL | Status: DC
  Administered 2015-01-19 – 2015-01-22 (×6): 237 mL via ORAL

## 2015-01-19 MED ORDER — LEVETIRACETAM 750 MG PO TABS
750.0000 mg | ORAL_TABLET | Freq: Two times a day (BID) | ORAL | Status: DC
  Administered 2015-01-19 – 2015-01-22 (×5): 750 mg via ORAL
  Filled 2015-01-19 (×8): qty 1

## 2015-01-19 NOTE — Progress Notes (Signed)
Initial Nutrition Assessment  DOCUMENTATION CODES:  Non-severe (moderate) malnutrition in context of chronic illness  Pt meets criteria for MODERATE MALNUTRITION in the context of chronic illness as evidenced by moderate fat and muscle mass loss.  INTERVENTION:  Ensure Enlive (each supplement provides 350kcal and 20 grams of protein) (BID)   Encourage adequate PO intake.  NUTRITION DIAGNOSIS:  Increased nutrient needs related to  (s/p surgery) as evidenced by estimated needs.  GOAL:  Patient will meet greater than or equal to 90% of their needs  MONITOR:  PO intake, Supplement acceptance, Weight trends, Labs, I & O's  REASON FOR ASSESSMENT:  Consult Assessment of nutrition requirement/status  ASSESSMENT: Pt with recent admission secondary to right hip fracture status post surgical repair, presents with fall, no loss of consciousness, no syncope or presyncope, unfortunately she has femur fracture.  PROCEDURE (5/29): OPEN REDUCTION INTERNAL FIXATION (ORIF) PERIPROSTHETIC FRACTURE   Pt reports her appetite is fine currently and PTA at home with consuming of 2 meals a day with an Ensure once daily. No recently recorded percent meal completion, however pt reports eating adequately. Weight has been stable. Pt is agreeable to Ensure to aid in caloric and protein needs. RD to order. Pt was encouraged to eat her food at meals and to consumer her supplements.   Nutrition-Focused physical exam completed. Findings are moderate fat depletion, moderate muscle depletion, and no edema.   Labs and medications reviewed.  Height:  Ht Readings from Last 1 Encounters:  01/18/15 5\' 3"  (1.6 m)    Weight:  Wt Readings from Last 1 Encounters:  01/18/15 121 lb (54.885 kg)    Ideal Body Weight:  52 kg  Wt Readings from Last 10 Encounters:  01/18/15 121 lb (54.885 kg)  12/26/14 120 lb 9.6 oz (54.704 kg)  12/11/14 121 lb (54.885 kg)  12/04/14 121 lb (54.885 kg)  04/09/14 127 lb 3.3  oz (57.7 kg)  12/19/13 130 lb (58.968 kg)  11/14/13 132 lb 4.8 oz (60.011 kg)  06/04/13 128 lb (58.06 kg)  05/06/13 128 lb (58.06 kg)  11/05/12 147 lb 3.2 oz (66.769 kg)    BMI:  Body mass index is 21.44 kg/(m^2).  Estimated Nutritional Needs:  Kcal:  1650-1850  Protein:  75-85 grams  Fluid:  1.6 - 1.8 L/day  Skin:   (Incision on lateral leg)  Diet Order:  Diet regular Room service appropriate?: Yes; Fluid consistency:: Thin  EDUCATION NEEDS:  No education needs identified at this time   Intake/Output Summary (Last 24 hours) at 01/19/15 1153 Last data filed at 01/19/15 0709  Gross per 24 hour  Intake 3541.25 ml  Output    995 ml  Net 2546.25 ml    Last BM:  5/29  Marijean NiemannStephanie La, MS, RD, LDN Pager # [phone] After hours/ weekend pager # [phone]

## 2015-01-19 NOTE — Evaluation (Signed)
Physical Therapy Evaluation Patient Details Name: [name] [name] MRN: [id number] DOB: [birth year] Today's Date: 01/19/2015   History of Present Illness  Pt is a 81 y/o F s/p fall, R femur fx and now R ORIF.  Pt w/ recent admission 4/16 2/2 R hip fx w/ surgical repair.  Pt's PMH includes fatigue, anxiety, dizziness, CAD, HTN, syncope and collapse, memory disorder, seizure disorder, depression, chronic diastolic CHF, COPD, CKD stage III, and R bipolar hip arthroplasty.  Clinical Impression  Patient is s/p above surgery resulting in functional limitations due to the deficits listed below (see PT Problem List). Pt refuses to ambulate this session and says "it is past 5:00 and this is my time to rest, take your bag and leave" and agrees to walk tomorrow.  Pt demonstrated ability to adhere to R NWB status during sit<>stand. Patient will benefit from skilled PT to increase their independence and safety with mobility to allow discharge to the venue listed below.     Follow Up Recommendations SNF    Equipment Recommendations  Other (comment) (TBD by next venue of care)    Recommendations for Other Services       Precautions / Restrictions Precautions Precautions: Fall Restrictions Weight Bearing Restrictions: Yes RLE Weight Bearing: Non weight bearing      Mobility  Bed Mobility Overal bed mobility: Needs Assistance Bed Mobility: Supine to Sit;Sit to Supine     Supine to sit: Min assist;HOB elevated Sit to supine: Min assist   General bed mobility comments: Min assist for managing BLEs during supine<>sit.  Pt w/ use of bed rails and requires verbal cues for sequencing.  Pt able to scoot toward HOB using railings w/ BUEs and pushing through LLE.  Transfers Overall transfer level: Needs assistance Equipment used: Rolling walker (2 wheeled) Transfers: Sit to/from Stand Sit to Stand: Min assist         General transfer comment: Requires min assist to power up to standing.  Pt requires  verbal cues for hand placement and technique to not WB through RLE which pt adhered to.    Ambulation/Gait                Stairs            Wheelchair Mobility    Modified Rankin (Stroke Patients Only)       Balance Overall balance assessment: Needs assistance;History of Falls Sitting-balance support: Feet unsupported;Bilateral upper extremity supported Sitting balance-Leahy Scale: Poor   Postural control: Posterior lean Standing balance support: Bilateral upper extremity supported Standing balance-Leahy Scale: Poor (Pt w/ posterior lean standing EOB )                               Pertinent Vitals/Pain Pain Assessment: Faces Faces Pain Scale: Hurts even more Pain Location: R hip Pain Descriptors / Indicators: Aching Pain Intervention(s): Limited activity within patient's tolerance;Monitored during session;Repositioned    Home Living Family/patient expects to be discharged to:: Skilled nursing facility Living Arrangements: Alone               Additional Comments: Pt lives alone at apt w/ level entry w/ intermittent assist from son.  Pt's daughter present during evaluation who reports her son is available intermittent but he is not always "the most willing" to help.    Prior Function Level of Independence: Independent with assistive device(s)         Comments: Rollator at all times  Hand Dominance   Dominant Hand: Right    Extremity/Trunk Assessment               Lower Extremity Assessment: Generalized weakness;RLE deficits/detail RLE Deficits / Details: muscle guarding w/ weakness and limited ROM s/p surgery       Communication   Communication: No difficulties  Cognition Arousal/Alertness: Awake/alert Behavior During Therapy: Agitated Overall Cognitive Status: History of cognitive impairments - at baseline       Memory:  (Pt able to remember that she is not to put any weight on RLE)              General  Comments General comments (skin integrity, edema, etc.): Pt refused to participate in ambulatory activity.  Took much education on importance of mobility and support from RN and sister to have pt agree to perform exercises, sit EOB, and stand EOB.  Pt says, "I already go up today and now I'm tired", "It's past 5:00 and this is my time to rest", "I will show you how I can walk tomorrow"    Exercises Total Joint Exercises Ankle Circles/Pumps: AROM;Both;15 reps;Supine Heel Slides: AAROM;Right;Supine;10 reps Hip ABduction/ADduction: AAROM;Right;10 reps;Supine Straight Leg Raises: AAROM;Right;5 reps;Supine      Assessment/Plan    PT Assessment Patient needs continued PT services  PT Diagnosis Difficulty walking;Abnormality of gait;Generalized weakness;Acute pain   PT Problem List Decreased strength;Decreased range of motion;Decreased activity tolerance;Decreased balance;Decreased mobility;Decreased coordination;Decreased cognition;Decreased knowledge of use of DME;Decreased safety awareness;Decreased knowledge of precautions;Cardiopulmonary status limiting activity;Decreased skin integrity;Pain  PT Treatment Interventions DME instruction;Gait training;Functional mobility training;Therapeutic activities;Therapeutic exercise;Balance training;Neuromuscular re-education;Cognitive remediation;Patient/family education;Modalities;Wheelchair mobility training   PT Goals (Current goals can be found in the Care Plan section) Acute Rehab PT Goals Patient Stated Goal: to rest now and work tomorrow PT Goal Formulation: With patient/family Time For Goal Achievement: 01/26/15 Potential to Achieve Goals: Good    Frequency Min 3X/week   Barriers to discharge Decreased caregiver support Intermittent assist at home    Co-evaluation               End of Session Equipment Utilized During Treatment: Oxygen Activity Tolerance: Treatment limited secondary to agitation Patient left: in bed;with call  bell/phone within reach;with bed alarm set;with family/visitor present;with SCD's reapplied Nurse Communication: Mobility status;Precautions;Weight bearing status         Time: 1610-9604 PT Time Calculation (min) (ACUTE ONLY): 22 min   Charges:   PT Evaluation $Initial PT Evaluation Tier I: 1 Procedure     PT G CodesMichail Jewels PT, DPT [phone] Pager: [phone] 01/19/2015, 5:31 PM

## 2015-01-19 NOTE — Progress Notes (Signed)
[facility] PROGRESS NOTE  [name] [name] UVO:[id number] DOB: 01/01/1927 DOA: 01/18/2015 PCP: [name], MD  Assessment/Plan: Principal Problem:   Periprosthetic fracture around internal prosthetic joint - Orthopedic surgery managing - Continue supportive therapy    Malnutrition of moderate degree/Severe protein-calorie malnutrition - Malnutrition agree with registered dietitian's notes please review for details  Active Problems:   Seizure disorder - No seizure activity reported patient's currently on Keppra    Hypertension - Patient is not on any antihypertensives and blood pressures remain within normal limits. Has had decreased value reported initially. - Last blood pressure 97/68    Hyperlipidemia   Coronary artery disease - Patient is currently on Plavix and aspirin. No chest pain reported    Anemia - Could be secondary to chronic disease given history CK D. Last hemoglobin up from prior check. - No active bleeding    GERD (gastroesophageal reflux disease) - Stable patient on Protonix    Diastolic CHF, chronic - Compensated currently    Acute renal failure/  CKD (chronic kidney disease) stage 3, GFR 30-59 ml/min - Reassess serum creatinine next a.m. - Could be prerenal given elevated BUN/creatinine ratio    Hypokalemia - Mildly decreased - We'll reassess next a.m.  Code Status: Full Family Communication: No family at bedside Disposition Plan: Pending improvement in condition   Consultants:  Orthopedic surgery Dr. Eulah Pont  Procedures:  *Please refer to orthopedic surgeons notes  Antibiotics:  None  HPI/Subjective: Patient has no new complaints  Objective: Filed Vitals:   01/19/15 1337  BP: 97/68  Pulse: 93  Temp: 98.8 F (37.1 C)  Resp: 18    Intake/Output Summary (Last 24 hours) at 01/19/15 1524 Last data filed at 01/19/15 1521  Gross per 24 hour  Intake 2541.25 ml  Output   1120 ml  Net 1421.25 ml   Filed Weights   01/18/15 0605  Weight: 54.885 kg (121 lb)    Exam:   General:  Patient in no acute distress, alert and awake  Cardiovascular: S1 and S2 within normal limits, no rubs  Respiratory: Clear to auscultation bilaterally, no wheezes, equal chest rise  Abdomen: Soft, nondistended, no guarding  Musculoskeletal: No clubbing   Data Reviewed: Basic Metabolic Panel:  Recent Labs Lab 01/18/15 0623 01/18/15 0750 01/18/15 1533 01/18/15 1642  NA 133*  --  138 140  K 3.1*  --  3.4* 3.4*  CL 98*  --   --   --   CO2 22  --   --   --   GLUCOSE 123*  --  137* 159*  BUN 27*  --   --   --   CREATININE 1.53*  --   --   --   CALCIUM 8.4* 8.3*  --   --    Liver Function Tests:  Recent Labs Lab 01/18/15 0750  ALBUMIN 3.7   No results for input(s): LIPASE, AMYLASE in the last 168 hours. No results for input(s): AMMONIA in the last 168 hours. CBC:  Recent Labs Lab 01/18/15 0623 01/18/15 1533 01/18/15 1642  WBC 10.8*  --   --   NEUTROABS 6.7  --   --   HGB 11.1* 8.2* 9.2*  HCT 33.5* 24.0* 27.0*  MCV 91.8  --   --   PLT 355  --   --    Cardiac Enzymes: No results for input(s): CKTOTAL, CKMB, CKMBINDEX, TROPONINI in the last 168 hours. BNP (last 3 results)  Recent Labs  10/03/14 2205  BNP 89.5  ProBNP (last 3 results) No results for input(s): PROBNP in the last 8760 hours.  CBG: No results for input(s): GLUCAP in the last 168 hours.  Recent Results (from the past 240 hour(s))  Surgical pcr screen     Status: Abnormal   Collection Time: 01/18/15 12:56 PM  Result Value Ref Range Status   MRSA, PCR POSITIVE (A) NEGATIVE Final   Staphylococcus aureus POSITIVE (A) NEGATIVE Final    Comment:        The Xpert SA Assay (FDA approved for NASAL specimens in patients over 79 years of age), is one component of a comprehensive surveillance program.  Test performance has been validated by [facility] for patients greater than or equal to 79 year old. It is not intended to  diagnose infection nor to guide or monitor treatment.      Studies: Dg Chest 1 View  01/18/2015   CLINICAL DATA:  Preop chest x-ray, femur fracture.  EXAM: CHEST  1 VIEW  COMPARISON:  12/05/2014  FINDINGS: Patient is moderately rotated to the right. Lungs are adequately inflated without consolidation or effusion. Cardiomediastinal silhouette is within normal. Remainder the exam is unchanged to include a lower thoracic spine compression fracture post kyphoplasty.  IMPRESSION: No active disease.   Electronically Signed   By: Elberta Fortisaniel  Boyle M.D.   On: 01/18/2015 07:33   Dg Pelvis 1-2 Views  01/18/2015   CLINICAL DATA:  Unwitnessed fall. Recent right hip replacement last month.  EXAM: PELVIS - 1-2 VIEW  COMPARISON:  12/05/2014  FINDINGS: Right unipolar hip arthroplasty intact. Subtle focal irregularity over the right greater trochanter likely postsurgical. Mild degenerative change of the left hip. Degenerative changes of the spine. No acute fracture or dislocation.  IMPRESSION: No acute findings.   Electronically Signed   By: Elberta Fortisaniel  Boyle M.D.   On: 01/18/2015 07:36   Dg C-arm 1-60 Min  01/18/2015   CLINICAL DATA:  Right femoral fracture fixation, intraoperative imaging  EXAM: RIGHT FEMUR 2 VIEWS; DG C-ARM 61-120 MIN  COMPARISON:  Preoperative imaging 01/18/2015  FINDINGS: Four intra procedural fluoroscopic images are provided demonstrating side plate and screw/ cerclage fixation of left mid femoral fracture. Fracture fragments are in near anatomic alignment. Previously seen right hip arthroplasty partly visualized. No evidence for hardware failure.  IMPRESSION: Near anatomic alignment after right femoral fracture fixation.   Electronically Signed   By: Christiana PellantGretchen  Green M.D.   On: 01/18/2015 16:08   Dg Femur, Min 2 Views Right  01/18/2015   CLINICAL DATA:  Right femoral fracture fixation, intraoperative imaging  EXAM: RIGHT FEMUR 2 VIEWS; DG C-ARM 61-120 MIN  COMPARISON:  Preoperative imaging 01/18/2015   FINDINGS: Four intra procedural fluoroscopic images are provided demonstrating side plate and screw/ cerclage fixation of left mid femoral fracture. Fracture fragments are in near anatomic alignment. Previously seen right hip arthroplasty partly visualized. No evidence for hardware failure.  IMPRESSION: Near anatomic alignment after right femoral fracture fixation.   Electronically Signed   By: Christiana PellantGretchen  Green M.D.   On: 01/18/2015 16:08   Dg Femur, Min 2 Views Right  01/18/2015   CLINICAL DATA:  Fall.  Recent right hip replacement.  EXAM: RIGHT FEMUR 2 VIEWS  COMPARISON:  12/05/2014  FINDINGS: Unipolar right hip arthroplasty is intact and unchanged. There is a oblique mid diaphyseal fracture with approximately 1 shaft's with of lateral displacement of the distal fragment and medial angulation of the distal fragment. Remainder of the exam is unremarkable.  IMPRESSION: Displaced oblique fracture  of the right mid femoral diaphysis.   Electronically Signed   By: Elberta Fortis M.D.   On: 01/18/2015 07:32   Dg Femur Port, Min 2 Views Right  01/18/2015   CLINICAL DATA:  Postop exam after ORIF right femur fracture  EXAM: RIGHT FEMUR PORTABLE 1 VIEW  COMPARISON:  Intraoperative imaging same date  FINDINGS: Expected postoperative appearance after side plate, screws, and cerclage fixation of oblique right mid femoral fracture. Fracture fragments are in near anatomic alignment. Right total hip arthroplasty reidentified.  IMPRESSION: Expected postoperative appearance after right femoral fracture.   Electronically Signed   By: Christiana Pellant M.D.   On: 01/18/2015 17:50    Scheduled Meds: . aspirin EC  325 mg Oral Q breakfast  . Chlorhexidine Gluconate Cloth  6 each Topical Q0600  . clopidogrel  75 mg Oral Daily  . docusate sodium  100 mg Oral BID  . feeding supplement (ENSURE ENLIVE)  237 mL Oral BID BM  . ferrous sulfate  325 mg Oral TID PC  . levETIRAcetam  750 mg Oral BID  . mupirocin ointment  1 application  Nasal BID  . pantoprazole  40 mg Oral BID  . potassium chloride  40 mEq Oral Once  . pregabalin  75 mg Oral Daily  . tiotropium  18 mcg Inhalation Daily   Continuous Infusions: . 0.9 % NaCl with KCl 20 mEq / L 75 mL/hr at 01/19/15 0600     Time spent: > 35 minutes    Penny Pia  [facility] Pager [phone]. If 7PM-7AM, please contact night-coverage at [email or web address], password [facility] 01/19/2015, 3:24 PM  LOS: 1 day

## 2015-01-20 ENCOUNTER — Encounter (HOSPITAL_COMMUNITY): Payer: Self-pay | Admitting: Orthopedic Surgery

## 2015-01-20 DIAGNOSIS — E440 Moderate protein-calorie malnutrition: Secondary | ICD-10-CM

## 2015-01-20 DIAGNOSIS — K210 Gastro-esophageal reflux disease with esophagitis: Secondary | ICD-10-CM

## 2015-01-20 SURGERY — Surgical Case
Anesthesia: *Unknown

## 2015-01-20 NOTE — Clinical Social Work Placement (Signed)
   CLINICAL SOCIAL WORK PLACEMENT  NOTE  Date:  01/20/2015  Patient Details  Name: [name] Settlemyre MRN: [id number] Date of Birth: 05/11/1927  Clinical Social Work is seeking post-discharge placement for this patient at the Skilled  Nursing Facility level of care (*CSW will initial, date and re-position this form in  chart as items are completed):  Yes   Patient/family provided with [place] Clinical Social Work Department's list of facilities offering this level of care within the geographic area requested by the patient (or if unable, by the patient's family).  Yes   Patient/family informed of their freedom to choose among providers that offer the needed level of care, that participate in Medicare, Medicaid or managed care program needed by the patient, have an available bed and are willing to accept the patient.  Yes   Patient/family informed of 's ownership interest in [facility] and [facility], as well as of the fact that they are under no obligation to receive care at these facilities.  PASRR submitted to EDS on       PASRR number received on       Existing PASRR number confirmed on 01/20/15     FL2 transmitted to all facilities in geographic area requested by pt/family on 01/20/15     FL2 transmitted to all facilities within larger geographic area on       Patient informed that his/her managed care company has contracts with or will negotiate with certain facilities, including the following:            Patient/family informed of bed offers received.  Patient chooses bed at       Physician recommends and patient chooses bed at      Patient to be transferred to   on  .  Patient to be transferred to facility by       Patient family notified on   of transfer.  Name of family member notified:        PHYSICIAN Please sign FL2     Additional Comment:    _______________________________________________ Rod MaeVaughn, Abria Vannostrand S, LCSW 01/20/2015, 4:00  PM [phone]

## 2015-01-20 NOTE — Clinical Social Work Note (Signed)
Clinical Social Work Assessment  Patient Details  Name: [name] [name] G Eaker MRN: [id number] Date of Birth: 1927/05/01  Date of referral:  01/20/15               Reason for consult:  Facility Placement, Discharge Planning                Permission sought to share information with:    Permission granted to share information::  No  Name::        Agency::     Relationship::     Contact Information:     Housing/Transportation Living arrangements for the past 2 months:  Single Family Home Source of Information:  Patient Patient Interpreter Needed:  None Criminal Activity/Legal Involvement Pertinent to Current Situation/Hospitalization:  No - Comment as needed Significant Relationships:  None Lives with:  Self Do you feel safe going back to the place where you live?  No (High fall risk) Need for family participation in patient care:  No (Coment)  Care giving concerns:  Patient expressed no concerns at this time.   Social Worker assessment / plan:  CSW spoke with patient regarding discharge plan. Per patient, patient [facility] and would like to return once medically stable. CSW to initiate SNF search. CSW to continue to follow and assist with discharge planning needs.  Employment status:  Retired Health and safety inspectornsurance information:  Medicare PT Recommendations:  Skilled Nursing Facility Information / Referral to community resources:  Skilled Nursing Facility  Patient/Family's Response to care:  Patient understanding and agreeable to CSW plan of care.  Patient/Family's Understanding of and Emotional Response to Diagnosis, Current Treatment, and Prognosis:  Patient understanding and agreeable to CSW plan of care.  Emotional Assessment Appearance:  Appears stated age Attitude/Demeanor/Rapport:  Other (Pleasant) Affect (typically observed):  Appropriate, Pleasant, Calm, Happy Orientation:  Oriented to Self, Oriented to Place, Oriented to  Time,  Oriented to Situation Alcohol / Substance use:  Not Applicable Psych involvement (Current and /or in the community):  No (Comment) (Not appropriate on this admission.)  Discharge Needs  Concerns to be addressed:  No discharge needs identified Readmission within the last 30 days:  No Current discharge risk:  None Barriers to Discharge:  No Barriers Identified   Rod MaeVaughn, Almir Botts S, LCSW 01/20/2015, 3:58 PM [phone]

## 2015-01-20 NOTE — Progress Notes (Signed)
     Subjective:  POD#2 ORIF R periprosthetic fx. Patient reports pain as mild.  Resting comfortably in bed.   Objective:   VITALS:   Filed Vitals:   01/19/15 1337 01/19/15 2003 01/20/15 0643 01/20/15 0815  BP: 97/68  100/50   Pulse: 93 92 74   Temp: 98.8 F (37.1 C)  98.1 F (36.7 C)   TempSrc: Oral     Resp: 18     Height:      Weight:      SpO2: 100% 100% 100% 98%    ABD soft Neurovascular intact Sensation intact distally Intact pulses distally Dorsiflexion/Plantar flexion intact Incision: dressing C/D/I   Lab Results  Component Value Date   WBC 10.8* 01/18/2015   HGB 9.2* 01/18/2015   HCT 27.0* 01/18/2015   MCV 91.8 01/18/2015   PLT 355 01/18/2015   BMET    Component Value Date/Time   NA 140 01/18/2015 1642   NA 134* 11/14/2013 1308   K 3.4* 01/18/2015 1642   K 4.7 11/14/2013 1308   CL 98* 01/18/2015 0623   CL 104 11/05/2012 1411   CO2 22 01/18/2015 0623   CO2 23 11/14/2013 1308   GLUCOSE 159* 01/18/2015 1642   GLUCOSE 124 11/14/2013 1308   GLUCOSE 94 11/05/2012 1411   BUN 27* 01/18/2015 0623   BUN 20.2 11/14/2013 1308   CREATININE 1.53* 01/18/2015 0623   CREATININE 1.40* 01/16/2014 2050   CREATININE 1.4* 11/14/2013 1308   CALCIUM 8.3* 01/18/2015 0750   CALCIUM 9.6 11/14/2013 1308   GFRNONAA 30* 01/18/2015 0623   GFRAA 34* 01/18/2015 0623     Assessment/Plan: 2 Days Post-Op   Principal Problem:   Periprosthetic fracture around internal prosthetic joint Active Problems:   Seizure disorder   Hypertension   Hyperlipidemia   Coronary artery disease   Anemia   GERD (gastroesophageal reflux disease)   Diastolic CHF, chronic   CKD (chronic kidney disease) stage 3, GFR 30-59 ml/min   Femur fracture   Recurrent falls   Acute renal failure   Dehydration with hyponatremia   FTT (failure to thrive) in adult   Severe protein-calorie malnutrition   Hypokalemia   Closed femur fracture   Malnutrition of moderate degree   Up with  therapy NWB in the RLE ASA 325mg  for DVT prophylaxis Plan is for d/c to facility once bed is available and cleared by medicine.   [name] [name] 01/20/2015, 10:19 AM Cell [phone]

## 2015-01-20 NOTE — Progress Notes (Signed)
OT Cancellation Note  Patient Details Name: [name] [name] MRN: [id number] DOB: 05/22/1927   Cancelled Treatment:    Reason Eval/Treat Not Completed: OT screened, no needs identified, will sign off (Defer needs > SNF). Plan is for patient to discharge > SNF. Please send text page to OT services if any questions or with new orders: [phone] OR call office at [phone]. Thank you for the order.   Rhyatt Muska , MS, OTR/L, CLT Pager: [phone]  01/20/2015, 8:44 AM

## 2015-01-20 NOTE — Progress Notes (Signed)
[facility] PROGRESS NOTE  [name] [name] ZOX:[id number] DOB: [birth year] DOA: 01/18/2015 PCP: [name], MD  Assessment/Plan: Principal Problem:   Periprosthetic fracture around internal prosthetic joint - Orthopedic surgery managing - Continue supportive therapy    Malnutrition of moderate degree/Severe protein-calorie malnutrition - Malnutrition agree with registered dietitian's notes please review for details  Active Problems:   Seizure disorder - No seizure activity reported patient's currently on Keppra    Hypertension - Patient is not on any antihypertensives and blood pressures remain within normal limits. Has had decreased value reported initially. - Last blood pressure 139/60    Hyperlipidemia   Coronary artery disease - Patient is currently on Plavix and aspirin. No chest pain reported    Anemia - Could be secondary to chronic disease given history CKD. Last hemoglobin up from prior check. - No active bleeding    GERD (gastroesophageal reflux disease) - Stable patient on Protonix    Diastolic CHF, chronic - Compensated currently    Acute renal failure/  CKD (chronic kidney disease) stage 3, GFR 30-59 ml/min - Reassess serum creatinine next a.m. - Could be prerenal given elevated BUN/creatinine ratio    Hypokalemia - Mildly decreased - We'll reassess next a.m. - currently on oral K replacement.  Code Status: Full Family Communication: No family at bedside Disposition Plan: Pending improvement in condition   Consultants:  Orthopedic surgery Dr. Eulah Pont  Procedures:  *Please refer to orthopedic surgeons notes  Antibiotics:  None  HPI/Subjective: Patient has no new complaints  Objective: Filed Vitals:   01/20/15 1346  BP: 139/60  Pulse: 103  Temp: 99.3 F (37.4 C)  Resp:     Intake/Output Summary (Last 24 hours) at 01/20/15 1746 Last data filed at 01/20/15 1500  Gross per 24 hour  Intake   2564 ml  Output    300 ml  Net    2264 ml   Filed Weights   01/18/15 0605  Weight: 54.885 kg (121 lb)    Exam:   General:  Patient in no acute distress, alert and awake  Cardiovascular: S1 and S2 within normal limits, no rubs  Respiratory: Clear to auscultation bilaterally, no wheezes, equal chest rise  Abdomen: Soft, nondistended, no guarding  Musculoskeletal: No clubbing   Data Reviewed: Basic Metabolic Panel:  Recent Labs Lab 01/18/15 0623 01/18/15 0750 01/18/15 1533 01/18/15 1642  NA 133*  --  138 140  K 3.1*  --  3.4* 3.4*  CL 98*  --   --   --   CO2 22  --   --   --   GLUCOSE 123*  --  137* 159*  BUN 27*  --   --   --   CREATININE 1.53*  --   --   --   CALCIUM 8.4* 8.3*  --   --    Liver Function Tests:  Recent Labs Lab 01/18/15 0750  ALBUMIN 3.7   No results for input(s): LIPASE, AMYLASE in the last 168 hours. No results for input(s): AMMONIA in the last 168 hours. CBC:  Recent Labs Lab 01/18/15 0623 01/18/15 1533 01/18/15 1642  WBC 10.8*  --   --   NEUTROABS 6.7  --   --   HGB 11.1* 8.2* 9.2*  HCT 33.5* 24.0* 27.0*  MCV 91.8  --   --   PLT 355  --   --    Cardiac Enzymes: No results for input(s): CKTOTAL, CKMB, CKMBINDEX, TROPONINI in the last 168 hours. BNP (last  3 results)  Recent Labs  10/03/14 2205  BNP 89.5    ProBNP (last 3 results) No results for input(s): PROBNP in the last 8760 hours.  CBG: No results for input(s): GLUCAP in the last 168 hours.  Recent Results (from the past 240 hour(s))  Surgical pcr screen     Status: Abnormal   Collection Time: 01/18/15 12:56 PM  Result Value Ref Range Status   MRSA, PCR POSITIVE (A) NEGATIVE Final   Staphylococcus aureus POSITIVE (A) NEGATIVE Final    Comment:        The Xpert SA Assay (FDA approved for NASAL specimens in patients over 79 years of age), is one component of a comprehensive surveillance program.  Test performance has been validated by [facility] for patients greater than or equal to 1 year  old. It is not intended to diagnose infection nor to guide or monitor treatment.      Studies: No results found.  Scheduled Meds: . aspirin EC  325 mg Oral Q breakfast  . Chlorhexidine Gluconate Cloth  6 each Topical Q0600  . clopidogrel  75 mg Oral Daily  . docusate sodium  100 mg Oral BID  . feeding supplement (ENSURE ENLIVE)  237 mL Oral BID BM  . ferrous sulfate  325 mg Oral TID PC  . levETIRAcetam  750 mg Oral BID  . mupirocin ointment  1 application Nasal BID  . pantoprazole  40 mg Oral BID  . potassium chloride  40 mEq Oral Once  . pregabalin  75 mg Oral Daily  . tiotropium  18 mcg Inhalation Daily   Continuous Infusions: . 0.9 % NaCl with KCl 20 mEq / L 75 mL/hr at 01/20/15 1458     Time spent: > 35 minutes    Penny PiaVEGA, Rayansh Herbst  [facility] Pager [phone]. If 7PM-7AM, please contact night-coverage at [email or web address], password [email or web address] 01/20/2015, 5:46 PM  LOS: 2 days

## 2015-01-20 NOTE — Progress Notes (Signed)
Physical Therapy Treatment Patient Details Name: [name] [name] MRN: [id number] DOB: 05/29/1927 Today's Date: 01/20/2015    History of Present Illness Pt is a 81 y/o F s/p fall, R femur fx and now R ORIF.  Pt w/ recent admission 4/16 2/2 R hip fx w/ surgical repair.  Pt's PMH includes fatigue, anxiety, dizziness, CAD, HTN, syncope and collapse, memory disorder, seizure disorder, depression, chronic diastolic CHF, COPD, CKD stage III, and R bipolar hip arthroplasty.    PT Comments    Pt performed stand pivot this session but was unable to adhere to RLE NWB status and instead performed TDWB despite verbal and tactile cues.  Pt will benefit from continued skilled PT services to increase functional independence and safety.  Follow Up Recommendations  SNF     Equipment Recommendations  Other (comment) (TBD by next venue of care)    Recommendations for Other Services       Precautions / Restrictions Precautions Precautions: Fall Restrictions Weight Bearing Restrictions: Yes RLE Weight Bearing: Non weight bearing    Mobility  Bed Mobility Overal bed mobility: Needs Assistance Bed Mobility: Supine to Sit     Supine to sit: HOB elevated;Min assist;+2 for physical assistance     General bed mobility comments: Min assist for managing BLEs and trunk posteriorly during supine>sit.  Cues for hand placement and use of bed railing.  Transfers Overall transfer level: Needs assistance Equipment used: Rolling walker (2 wheeled) Transfers: Sit to/from [employer] Sit to Stand: Min assist;From elevated surface Stand pivot transfers: Min assist       General transfer comment: Min assist to power up to standing and to maneuver RW during stand pivot.  Pt is not adherent to WB status and does TDWB instead despite verbal and tactile cues. (Pt lacking BUE strength to adhere to R NWB status)  Ambulation/Gait             General Gait Details: pivotal steps  only   Stairs            Wheelchair Mobility    Modified Rankin (Stroke Patients Only)       Balance Overall balance assessment: Needs assistance;History of Falls Sitting-balance support: Feet supported;Bilateral upper extremity supported Sitting balance-Leahy Scale: Poor     Standing balance support: Bilateral upper extremity supported;During functional activity Standing balance-Leahy Scale: Fair                      Cognition Arousal/Alertness: Awake/alert Behavior During Therapy: WFL for tasks assessed/performed Overall Cognitive Status: History of cognitive impairments - at baseline                      Exercises Total Joint Exercises Ankle Circles/Pumps: AROM;Both;20 reps;Supine Heel Slides: AAROM;Right;Supine;10 reps;AROM;Left Hip ABduction/ADduction: AAROM;Right;10 reps;Supine Straight Leg Raises: AAROM;Right;5 reps;Supine    General Comments        Pertinent Vitals/Pain Pain Assessment: Faces Faces Pain Scale: Hurts even more Pain Location: R hip Pain Descriptors / Indicators: Aching;Grimacing Pain Intervention(s): Limited activity within patient's tolerance;Monitored during session;Repositioned    Home Living                      Prior Function            PT Goals (current goals can now be found in the care plan section) Acute Rehab PT Goals Patient Stated Goal: to get to chair Progress towards PT goals: Progressing toward goals  Frequency  Min 3X/week    PT Plan Current plan remains appropriate    Co-evaluation             End of Session Equipment Utilized During Treatment: Gait belt;Oxygen Activity Tolerance: Patient limited by fatigue Patient left: in chair;with call bell/phone within reach;with chair alarm set (w/ tray over pt's lap)     Time: [id number] PT Time Calculation (min) (ACUTE ONLY): 21 min  Charges:  $Therapeutic Exercise: 8-22 mins                    G Codes:      Michail JewelsAshley Parr  PT, DPT [phone] Pager: [phone] 01/20/2015, 10:54 AM

## 2015-01-21 DIAGNOSIS — Z9660 Presence of unspecified orthopedic joint implant: Secondary | ICD-10-CM

## 2015-01-21 DIAGNOSIS — E871 Hypo-osmolality and hyponatremia: Secondary | ICD-10-CM

## 2015-01-21 DIAGNOSIS — N183 Chronic kidney disease, stage 3 (moderate): Secondary | ICD-10-CM

## 2015-01-21 DIAGNOSIS — T84049S Periprosthetic fracture around unspecified internal prosthetic joint, sequela: Secondary | ICD-10-CM

## 2015-01-21 DIAGNOSIS — N179 Acute kidney failure, unspecified: Secondary | ICD-10-CM

## 2015-01-21 LAB — CBC
HCT: 24.8 % — ABNORMAL LOW (ref 36.0–46.0)
Hemoglobin: 8.2 g/dL — ABNORMAL LOW (ref 12.0–15.0)
MCH: 30.6 pg (ref 26.0–34.0)
MCHC: 33.1 g/dL (ref 30.0–36.0)
MCV: 92.5 fL (ref 78.0–100.0)
PLATELETS: 230 10*3/uL (ref 150–400)
RBC: 2.68 MIL/uL — ABNORMAL LOW (ref 3.87–5.11)
RDW: 15.8 % — AB (ref 11.5–15.5)
WBC: 10 10*3/uL (ref 4.0–10.5)

## 2015-01-21 LAB — BASIC METABOLIC PANEL
Anion gap: 10 (ref 5–15)
BUN: 9 mg/dL (ref 6–20)
CO2: 23 mmol/L (ref 22–32)
CREATININE: 0.85 mg/dL (ref 0.44–1.00)
Calcium: 8.5 mg/dL — ABNORMAL LOW (ref 8.9–10.3)
Chloride: 104 mmol/L (ref 101–111)
GFR calc Af Amer: >60 mL/min (ref >60)
Glucose, Bld: 113 mg/dL — ABNORMAL HIGH (ref 65–99)
Potassium: 4.3 mmol/L (ref 3.5–5.1)
Sodium: 137 mmol/L (ref 135–145)

## 2015-01-21 MED ORDER — DOCUSATE SODIUM 100 MG PO CAPS
100.0000 mg | ORAL_CAPSULE | Freq: Two times a day (BID) | ORAL | Status: AC
Start: 2015-01-21 — End: ?

## 2015-01-21 MED ORDER — ASPIRIN 325 MG PO TBEC: 325.0000 mg | DELAYED_RELEASE_TABLET | Freq: Every day | ORAL | Status: DC

## 2015-01-21 MED ORDER — ENSURE ENLIVE PO LIQD: 237.0000 mL | Freq: Two times a day (BID) | ORAL | Status: DC

## 2015-01-21 MED ORDER — FUROSEMIDE 20 MG PO TABS: 20.0000 mg | ORAL_TABLET | Freq: Every day | ORAL | Status: DC

## 2015-01-21 MED ORDER — FERROUS SULFATE 325 (65 FE) MG PO TABS: 325.0000 mg | ORAL_TABLET | Freq: Three times a day (TID) | ORAL | Status: DC

## 2015-01-21 NOTE — Progress Notes (Signed)
     Subjective:  POD#3 ORIF R periprosthetic fx. Patient reports pain as mild.  Resting comfortably in bed.   Objective:   VITALS:   Filed Vitals:   01/20/15 0815 01/20/15 1346 01/20/15 2225 01/21/15 0550  BP:  139/60 178/71 143/53  Pulse:  103 109 96  Temp:  99.3 F (37.4 C) 97.9 F (36.6 C) 98 F (36.7 C)  TempSrc:  Oral Oral Oral  Resp:   18 18  Height:      Weight:      SpO2: 98% 100% 100% 100%    ABD soft Neurovascular intact Sensation intact distally Intact pulses distally Dorsiflexion/Plantar flexion intact Incision: dressing C/D/I   Lab Results  Component Value Date   WBC 10.8* 01/18/2015   HGB 9.2* 01/18/2015   HCT 27.0* 01/18/2015   MCV 91.8 01/18/2015   PLT 355 01/18/2015   BMET    Component Value Date/Time   NA 140 01/18/2015 1642   NA 134* 11/14/2013 1308   K 3.4* 01/18/2015 1642   K 4.7 11/14/2013 1308   CL 98* 01/18/2015 0623   CL 104 11/05/2012 1411   CO2 22 01/18/2015 0623   CO2 23 11/14/2013 1308   GLUCOSE 159* 01/18/2015 1642   GLUCOSE 124 11/14/2013 1308   GLUCOSE 94 11/05/2012 1411   BUN 27* 01/18/2015 0623   BUN 20.2 11/14/2013 1308   CREATININE 1.53* 01/18/2015 0623   CREATININE 1.40* 01/16/2014 2050   CREATININE 1.4* 11/14/2013 1308   CALCIUM 8.3* 01/18/2015 0750   CALCIUM 9.6 11/14/2013 1308   GFRNONAA 30* 01/18/2015 0623   GFRAA 34* 01/18/2015 0623     Assessment/Plan: 3 Days Post-Op   Principal Problem:   Periprosthetic fracture around internal prosthetic joint Active Problems:   Seizure disorder   Hypertension   Hyperlipidemia   Coronary artery disease   Anemia   GERD (gastroesophageal reflux disease)   Diastolic CHF, chronic   CKD (chronic kidney disease) stage 3, GFR 30-59 ml/min   Femur fracture   Recurrent falls   Acute renal failure   Dehydration with hyponatremia   FTT (failure to thrive) in adult   Severe protein-calorie malnutrition   Hypokalemia   Closed femur fracture   Malnutrition of  moderate degree   Up with therapy NWB in the RLE ASA 325mg  for DVT prophylaxis OK from ortho standpoint to d/c to facility once stable and cleared by medicine   [name] [name] 01/21/2015, 6:54 AM Cell [phone]

## 2015-01-21 NOTE — Clinical Social Work Note (Signed)
CSW met with patient to review disposition.  [facility] states that patient has only 2 days of SNF benefits left before being in her copay days.  Her copay is $156 per day.  [facility] requires at least two weeks up front from the patient prior to admission.  Patient is not financially able to make this upfront payment.  CSW reviewed the possibility of searching for SNF that would possibly make payment arrangements for the patient to receive STR rather than the upfront payment.  Patient is agreeable.  CSW contacted facilities and [facility] state they can make arrangements with this patient and bed available on Thursday.  This decision was confirmed by their business office.  Patient was made aware.  Patient would like to discuss this facility option with her son and sister.    Gina , LCSW [phone]  Psychiatric & Orthopedics (5N 1-8) Clinical Social Worker    

## 2015-01-21 NOTE — Clinical Social Work Note (Signed)
CSW was informed by SNF that patient has only 2 days left at 100%.  The remaining SNF days will be copay days- $156 per day.  This information was relayed to the patient.  Patient states she does not have a caregiver at home and will need SNF, but cannot afford an upfront payment.  CSW is attempting to locate a SNF facility that would accommodate payment arrangements.  [facility] but requires at least two weeks copayment up front.  Patient states she cannot meet that financial request.  Patient will discuss options with patient's sister while CSW attempts to locate a SNF that will accommodate payment arrangements.  Vickii PennaGina Lexany Belknap, LCSW [phone]  Psychiatric & Orthopedics (5N 1-8) Clinical Social Worker

## 2015-01-21 NOTE — Discharge Summary (Addendum)
Physician Discharge Summary  [name] [name] XLK:[id number] DOB: [birth year] DOA: 01/18/2015  PCP: [name], MD  Admit date: 01/18/2015 Discharge date: 01/22/2015  Time spent: 60 minutes  Recommendations for Outpatient Follow-up:  1. F/u with ortho- Dr Eulah Pont for wound check 2. Follow daily weights and check a metabolic panel in 1 week-adjust dose of Lasix as needed- follow for need to replace potassium  Discharge Condition: stable Diet recommendation: heart healthy, low sodium  Discharge Diagnoses:  Principal Problem:   Periprosthetic fracture - right femoral Active Problems:   Hypokalemia   Acute renal failure   Seizure disorder   Hypertension   Hyperlipidemia   Coronary artery disease   Anemia   GERD (gastroesophageal reflux disease)   Diastolic CHF, chronic   CKD (chronic kidney disease) stage 3, GFR 30-59 ml/min   Femur fracture   Recurrent falls   Dehydration with hyponatremia   FTT (failure to thrive) in adult   Severe protein-calorie malnutrition   Closed femur fracture   Malnutrition of moderate degree   History of present illness:  This is an 81 year old female who presented from home after a fall. She was ambulating to the bathroom in the middle of the night when she fell. She was found to have a right femur fracture, acute on chronic kidney disease and hyponatremia secondary to dehydration.  Principal Problem: Right periprosthetic femoral fracture -Underwent surgery on 5/29 -has been on Vicodin for pain control as prescribed by ortho -Ferrous sulfate ordered by ortho 3 times a day-she was previously taking it once a day -Ortho recommending a daily full dose aspirin for DVT prophylaxis (she is also on Plavix), nonweightbearing right lower extremity and and follow-up in the office   Malnutrition of moderate degree/Severe protein-calorie malnutrition -Has been started on Ensure twice a day  Acute renal failure/ CKD (chronic kidney disease) stage 3, GFR  30-59 ml/min/ hyponatremia - Furosemide was held and the patient was hydrated -Creatinine was 1.53 on admission with her baseline being 0.9-1 -Sodium 133 on admission improved to 137 today -Creatinine today is 0.85   Diastolic CHF, chronic -Last Echo in June 2015 revealed grade 1 diastolic dysfunction and normal systolic function - Furosemide held and patient hydrated as mentioned above-Will cut dose from 20 mg twice a day to 20 mg daily -Recommended to follow daily weights at home   Seizure disorder - No seizure activity reported  -Continue Keppra   Hypertension - Patient is not on any antihypertensives and blood pressures remain within normal limits.    Hyperlipidemia -Continue statin- noted to have 2 statins on Med rec- she was taking the Atorvastatin and therefore will d/c the Pravastatin   Coronary artery disease - Patient is on Plavix at home   Anemia- of acute blood loss superimposed on anemia of chronic disease - Baseline hemoglobin is around 10-after surgery hemoglobin has dropped to 8 and has been steady at this level -Anemia panel performed on 5/29 revealed normal iron levels-B-12 normal at 530-folic acid normal at 9.6 -Ferrous sulfate 3 times a day ordered by ortho   GERD (gastroesophageal reflux disease) - Uses Nexium at home   Hypokalemia - On admission-possibly secondary to diuretics-not on potassium replacement at home -Potassium replaced and now 4.3   Procedures:  5/29-ORIF right femur  Consultations:  Repeated surgery  Discharge Exam: [facility] Weights   01/18/15 0605  Weight: 54.885 kg (121 lb)   Filed Vitals:   01/22/15 0521  BP: 145/52  Pulse: 79  Temp: 99 F (  37.2 C)  Resp: 16    General: AAO x 3, no distress Cardiovascular: RRR, no murmurs  Respiratory: clear to auscultation bilaterally- oxygen level 98% on room air GI: soft, non-tender, non-distended, bowel sound positive  Discharge Instructions You were cared for by a  hospitalist during your hospital stay. If you have any questions about your discharge medications or the care you received while you were in the hospital after you are discharged, you can call the unit and asked to speak with the hospitalist on call if the hospitalist that took care of you is not available. Once you are discharged, your primary care physician will handle any further medical issues. Please note that NO REFILLS for any discharge medications will be authorized once you are discharged, as it is imperative that you return to your primary care physician (or establish a relationship with a primary care physician if you do not have one) for your aftercare needs so that they can reassess your need for medications and monitor your lab values.      Discharge Instructions    (HEART FAILURE PATIENTS) Call MD:  Anytime you have any of the following symptoms: 1) 3 pound weight gain in 24 hours or 5 pounds in 1 week 2) shortness of breath, with or without a dry hacking cough 3) swelling in the hands, feet or stomach 4) if you have to sleep on extra pillows at night in order to breathe.    Complete by:  As directed      Diet - low sodium heart healthy    Complete by:  As directed      Increase activity slowly    Complete by:  As directed             Medication List    STOP taking these medications        ondansetron 4 MG tablet  Commonly known as:  ZOFRAN     pantoprazole 40 MG tablet  Commonly known as:  PROTONIX     pravastatin 40 MG tablet  Commonly known as:  PRAVACHOL     traMADol 50 MG tablet  Commonly known as:  ULTRAM      TAKE these medications        acetaminophen 500 MG tablet  Commonly known as:  TYLENOL  Take 1,000 mg by mouth every 6 (six) hours as needed. For pain.     aspirin 325 MG EC tablet  Take 1 tablet (325 mg total) by mouth daily with breakfast.     atorvastatin 10 MG tablet  Commonly known as:  LIPITOR  Take 10 mg by mouth daily.     CALCIUM 600 +  D PO  Take 1 tablet by mouth daily.     clopidogrel 75 MG tablet  Commonly known as:  PLAVIX  Take 75 mg by mouth daily.     docusate sodium 100 MG capsule  Commonly known as:  COLACE  Take 1 capsule (100 mg total) by mouth 2 (two) times daily.     escitalopram 10 MG tablet  Commonly known as:  LEXAPRO  Take 10 mg by mouth daily.     esomeprazole 40 MG capsule  Commonly known as:  NEXIUM  Take 40 mg by mouth every morning. Before breakfast.     feeding supplement (ENSURE ENLIVE) Liqd  Take 237 mLs by mouth 2 (two) times daily between meals.     ferrous sulfate 325 (65 FE) MG tablet  Take 1 tablet (325  mg total) by mouth 3 (three) times daily with meals.     furosemide 20 MG tablet  Commonly known as:  LASIX  Take 1 tablet (20 mg total) by mouth daily.     HYDROcodone-acetaminophen 5-325 MG per tablet  Commonly known as:  NORCO  Take 1-2 tablets by mouth every 6 (six) hours as needed for moderate pain.     isosorbide mononitrate 30 MG 24 hr tablet  Commonly known as:  IMDUR  Take 30 mg by mouth daily.     levETIRAcetam 750 MG tablet  Commonly known as:  KEPPRA  Take 1 tablet (750 mg total) by mouth every 12 (twelve) hours.     LORazepam 0.5 MG tablet  Commonly known as:  ATIVAN  Take 0.5 mg by mouth every 6 (six) hours as needed.     menthol-thymol Liqd  Apply 1 application topically daily as needed (shoulder pain).     nitroGLYCERIN 0.4 MG SL tablet  Commonly known as:  NITROSTAT  Place 0.4 mg under the tongue every 5 (five) minutes as needed for chest pain.     ondansetron 4 MG disintegrating tablet  Commonly known as:  ZOFRAN ODT  Take 1 tablet (4 mg total) by mouth every 8 (eight) hours as needed for nausea or vomiting.     phenyltoloxamine-acetaminophen 30-325 MG per tablet  Take 1 tablet by mouth every 4 (four) hours as needed for pain.     pregabalin 75 MG capsule  Commonly known as:  LYRICA  Take 75 mg by mouth at bedtime.     tiotropium 18 MCG  inhalation capsule  Commonly known as:  SPIRIVA  Place 18 mcg into inhaler and inhale daily.     VITAMIN D PO  Take 1 capsule by mouth daily.       Allergies  Allergen Reactions  . Codeine Nausea And Vomiting   Follow-up Information    Follow up with MURPHY, TIMOTHY D, MD In 1 week.   Specialty:  Orthopedic Surgery   Contact information:   [address] [place] [address] [phone]        The results of significant diagnostics from this hospitalization (including imaging, microbiology, ancillary and laboratory) are listed below for reference.    Significant Diagnostic Studies: Dg Chest 1 View  01/18/2015   CLINICAL DATA:  Preop chest x-ray, femur fracture.  EXAM: CHEST  1 VIEW  COMPARISON:  12/05/2014  FINDINGS: Patient is moderately rotated to the right. Lungs are adequately inflated without consolidation or effusion. Cardiomediastinal silhouette is within normal. Remainder the exam is unchanged to include a lower thoracic spine compression fracture post kyphoplasty.  IMPRESSION: No active disease.   Electronically Signed   By: Elberta Fortisaniel  Boyle M.D.   On: 01/18/2015 07:33   Dg Pelvis 1-2 Views  01/18/2015   CLINICAL DATA:  Unwitnessed fall. Recent right hip replacement last month.  EXAM: PELVIS - 1-2 VIEW  COMPARISON:  12/05/2014  FINDINGS: Right unipolar hip arthroplasty intact. Subtle focal irregularity over the right greater trochanter likely postsurgical. Mild degenerative change of the left hip. Degenerative changes of the spine. No acute fracture or dislocation.  IMPRESSION: No acute findings.   Electronically Signed   By: Elberta Fortisaniel  Boyle M.D.   On: 01/18/2015 07:36   Dg C-arm 1-60 Min  01/18/2015   CLINICAL DATA:  Right femoral fracture fixation, intraoperative imaging  EXAM: RIGHT FEMUR 2 VIEWS; DG C-ARM 61-120 MIN  COMPARISON:  Preoperative imaging 01/18/2015  FINDINGS: Four intra procedural fluoroscopic images are provided demonstrating side plate and  screw/ cerclage fixation of left mid femoral fracture. Fracture fragments are in near anatomic alignment. Previously seen right hip arthroplasty partly visualized. No evidence for hardware failure.  IMPRESSION: Near anatomic alignment after right femoral fracture fixation.   Electronically Signed   By: Christiana Pellant M.D.   On: 01/18/2015 16:08   Dg Femur, Min 2 Views Right  01/18/2015   CLINICAL DATA:  Right femoral fracture fixation, intraoperative imaging  EXAM: RIGHT FEMUR 2 VIEWS; DG C-ARM 61-120 MIN  COMPARISON:  Preoperative imaging 01/18/2015  FINDINGS: Four intra procedural fluoroscopic images are provided demonstrating side plate and screw/ cerclage fixation of left mid femoral fracture. Fracture fragments are in near anatomic alignment. Previously seen right hip arthroplasty partly visualized. No evidence for hardware failure.  IMPRESSION: Near anatomic alignment after right femoral fracture fixation.   Electronically Signed   By: Christiana Pellant M.D.   On: 01/18/2015 16:08   Dg Femur, Min 2 Views Right  01/18/2015   CLINICAL DATA:  Fall.  Recent right hip replacement.  EXAM: RIGHT FEMUR 2 VIEWS  COMPARISON:  12/05/2014  FINDINGS: Unipolar right hip arthroplasty is intact and unchanged. There is a oblique mid diaphyseal fracture with approximately 1 shaft's with of lateral displacement of the distal fragment and medial angulation of the distal fragment. Remainder of the exam is unremarkable.  IMPRESSION: Displaced oblique fracture of the right mid femoral diaphysis.   Electronically Signed   By: Elberta Fortis M.D.   On: 01/18/2015 07:32   Dg Femur Port, Min 2 Views Right  01/18/2015   CLINICAL DATA:  Postop exam after ORIF right femur fracture  EXAM: RIGHT FEMUR PORTABLE 1 VIEW  COMPARISON:  Intraoperative imaging same date  FINDINGS: Expected postoperative appearance after side plate, screws, and cerclage fixation of oblique right mid femoral fracture. Fracture fragments are in near anatomic  alignment. Right total hip arthroplasty reidentified.  IMPRESSION: Expected postoperative appearance after right femoral fracture.   Electronically Signed   By: Christiana Pellant M.D.   On: 01/18/2015 17:50    Microbiology: Recent Results (from the past 240 hour(s))  Surgical pcr screen     Status: Abnormal   Collection Time: 01/18/15 12:56 PM  Result Value Ref Range Status   MRSA, PCR POSITIVE (A) NEGATIVE Final   Staphylococcus aureus POSITIVE (A) NEGATIVE Final    Comment:        The Xpert SA Assay (FDA approved for NASAL specimens in patients over 65 years of age), is one component of a comprehensive surveillance program.  Test performance has been validated by [facility] for patients greater than or equal to 1 year old. It is not intended to diagnose infection nor to guide or monitor treatment.      Labs: Basic Metabolic Panel:  Recent Labs Lab 01/18/15 0623 01/18/15 0750 01/18/15 1533 01/18/15 1642 01/21/15 0700  NA 133*  --  138 140 137  K 3.1*  --  3.4* 3.4* 4.3  CL 98*  --   --   --  104  CO2 22  --   --   --  23  GLUCOSE 123*  --  137* 159* 113*  BUN 27*  --   --   --  9  CREATININE 1.53*  --   --   --  0.85  CALCIUM 8.4* 8.3*  --   --  8.5*   Liver Function Tests:  Recent Labs  Lab 01/18/15 0750  ALBUMIN 3.7   No results for input(s): LIPASE, AMYLASE in the last 168 hours. No results for input(s): AMMONIA in the last 168 hours. CBC:  Recent Labs Lab 01/18/15 0623 01/18/15 1533 01/18/15 1642 01/21/15 0700  WBC 10.8*  --   --  10.0  NEUTROABS 6.7  --   --   --   HGB 11.1* 8.2* 9.2* 8.2*  HCT 33.5* 24.0* 27.0* 24.8*  MCV 91.8  --   --  92.5  PLT 355  --   --  230   Cardiac Enzymes: No results for input(s): CKTOTAL, CKMB, CKMBINDEX, TROPONINI in the last 168 hours. BNP: BNP (last 3 results)  Recent Labs  10/03/14 2205  BNP 89.5    ProBNP (last 3 results) No results for input(s): PROBNP in the last 8760 hours.  CBG: No results  for input(s): GLUCAP in the last 168 hours.     SignedCalvert Cantor, MD [facility] 01/22/2015, 9:44 AM

## 2015-01-21 NOTE — Progress Notes (Addendum)
Patient continues to voice the need to void, however, [facility]'t [facility]. 387 ml was noted in her bladder when the [employer] bladder scanned her. Notified MD Rizwan. Orders was given to continue to monitor. Doesn't want to place catheter until patient has 500ml or greater.

## 2015-01-21 NOTE — Progress Notes (Signed)
Assessed patient on room air remained above 96%. Will continue to monitor for desaturation

## 2015-01-22 ENCOUNTER — Telehealth (HOSPITAL_COMMUNITY): Payer: Self-pay | Admitting: Pharmacist

## 2015-01-22 ENCOUNTER — Encounter (HOSPITAL_COMMUNITY): Payer: Self-pay | Admitting: Orthopedic Surgery

## 2015-01-22 DIAGNOSIS — I5032 Chronic diastolic (congestive) heart failure: Secondary | ICD-10-CM

## 2015-01-22 DIAGNOSIS — T84048A Periprosthetic fracture around other internal prosthetic joint, initial encounter: Secondary | ICD-10-CM

## 2015-01-22 MED ORDER — HYDROCODONE-ACETAMINOPHEN 5-325 MG PO TABS: 1.0000 | ORAL_TABLET | Freq: Four times a day (QID) | ORAL | Status: DC | PRN

## 2015-01-22 MED ORDER — LEVETIRACETAM 750 MG PO TABS: 750.0000 mg | ORAL_TABLET | Freq: Two times a day (BID) | ORAL | Status: AC

## 2015-01-22 NOTE — Progress Notes (Signed)
[facility]  [name] [name] was evaluated this morning. She was discharged yesterday however there was trouble obtaining a bed at a nursing facility and therefore she could not leave the hospital. She is stable this morning and has no complaints. [facility]. See my updated discharge summary from 01/21/15.  Calvert CantorSaima Dequavius Kuhner, MD

## 2015-01-22 NOTE — Progress Notes (Signed)
     Subjective:  POD#4 ORIF R periprosthetic fracture. Patient reports pain as mild.  Resting comfortably.  Discharge was cleared by medicine but no beds available at the facility.  Hopeful she will be able to be transferred today.   Objective:   VITALS:   Filed Vitals:   01/21/15 1400 01/21/15 2128 01/22/15 0521 01/22/15 0540  BP: 140/53 138/52 145/52   Pulse: 97 63 79   Temp: 98.7 F (37.1 C) 98.8 F (37.1 C) 99 F (37.2 C)   TempSrc:  Oral Oral   Resp: 16 15 16    Height:      Weight:      SpO2: 100% 99% 100% 98%    ABD soft Neurovascular intact Sensation intact distally Intact pulses distally Dorsiflexion/Plantar flexion intact Incision: dressing C/D/I   Lab Results  Component Value Date   WBC 10.0 01/21/2015   HGB 8.2* 01/21/2015   HCT 24.8* 01/21/2015   MCV 92.5 01/21/2015   PLT 230 01/21/2015   BMET    Component Value Date/Time   NA 137 01/21/2015 0700   NA 134* 11/14/2013 1308   K 4.3 01/21/2015 0700   K 4.7 11/14/2013 1308   CL 104 01/21/2015 0700   CL 104 11/05/2012 1411   CO2 23 01/21/2015 0700   CO2 23 11/14/2013 1308   GLUCOSE 113* 01/21/2015 0700   GLUCOSE 124 11/14/2013 1308   GLUCOSE 94 11/05/2012 1411   BUN 9 01/21/2015 0700   BUN 20.2 11/14/2013 1308   CREATININE 0.85 01/21/2015 0700   CREATININE 1.40* 01/16/2014 2050   CREATININE 1.4* 11/14/2013 1308   CALCIUM 8.5* 01/21/2015 0700   CALCIUM 9.6 11/14/2013 1308   GFRNONAA >60 01/21/2015 0700   GFRAA >60 01/21/2015 0700     Assessment/Plan: 4 Days Post-Op   Principal Problem:   Periprosthetic fracture around internal prosthetic joint Active Problems:   Seizure disorder   Hypertension   Hyperlipidemia   Coronary artery disease   Anemia   GERD (gastroesophageal reflux disease)   Diastolic CHF, chronic   CKD (chronic kidney disease) stage 3, GFR 30-59 ml/min   Femur fracture   Recurrent falls   Acute renal failure   Dehydration with hyponatremia   FTT (failure to  thrive) in adult   Severe protein-calorie malnutrition   Hypokalemia   Closed femur fracture   Malnutrition of moderate degree   Up with therapy NWB in the RLE ASA 325mg  daily for DVT prophylaxis   Florabelle Cardin Marie 01/22/2015, 7:05 AM Cell [phone]

## 2015-01-22 NOTE — Progress Notes (Signed)
  Pharmacy Discharge Medication Therapy Review   Total Number of meds on admission ____20______ (polypharmacy > 10 meds)  Indications for all medications: [x]  Yes       []  No  Adherence Review  []  Excellent (no doses missed/week)     []  Good (no more than 1 dose missed/week)     [x]  Partial (2-3 doses missed/week)     []  Poor (>3 doses missed/week)  Total number of high risk medications __3_ (Anticoagulants, Dual antiplatelets, oral Antihyperglycemic agents,Insulins, Antipsychotics, Anti-Seizure meds, Inhalers, HF/ACS meds, Antibiotics and HIV medications)   Assessment: (Medication related problems)  Intervention  YES NO  Explanation   Indications      Medication without noted indication []  [x]     Indication without noted medication []  [x]     Duplicate therapy [x]  []   Upon review, two duplicates were found:   - Two orders for Zofran were found with identical indications/instructions but with differing tablet formulations (one oral solid and one ODT formulation). Recommend to drop oral solid formulation from discharge list.   - Atorvastatin and Pravastatin were also found on patient's discharge list. Upon questioning patient's sister, patient was taking pravastatin [facility] At[facility]vastatin. Sister states she continued use of Atorvastatin upon the patient's arrival home. Recommend to continue Atorvastatin upon discharge and to discontinue Pravastatin.  Efficacy      Suboptimal drug or dose selection []  [x]    Insufficient dose/duration []  [x]    Failure to receive therapy  (Rx not filled) []  [x]     Safety      Adverse drug event []  [x]     Drug interaction []  [x]     Excessive dose/duration []  [x]    High-risk medications []  [x]    Compliance     Underuse []  [x]     Overuse []  [x]    Other pertinent pharmacist counseling [x]  []   Patient was to be discharged on Keppra 750 mg PO Q12H using 500 mg tablets. Recommend change to 750 mg  tablets so that tablets would not have to be cut to meet required dose. Reviewed renal function, dose is appropriate.    Total number of new medications upon discharge: __3__  Time:  Time spent preparing for discharge counseling: 15 minutes Time spent counseling patient: 10 minutes Additional time spent on discharge (specify): 5 minutes.    PLAN:  Consider the following at discharge/Recommendations discussed with provider - DC ondansetron (ZOFRAN) 4 MG tablet. Continue Zofran ODT tablets. - DC pravastatin (PRAVACHOL) 40 MG tablet  - Change Keppra 500 mg tablets to Keppra 750 mg tablets for better adherence/tolerance/drug interaction  Red ChristiansSamson Tenley Winward, Pharm. D. Clinical Pharmacy Resident Pager: [phone] Ph: [phone] 01/22/2015 10:50 AM

## 2015-01-22 NOTE — Telephone Encounter (Signed)
Spoke with patient's sister. Communicated discharge plan with her. Informed sister of pravastatin discontinuation. Acetaminophen use counseling given. Also, attempted to contact patient's son but no answer.

## 2015-01-22 NOTE — Progress Notes (Signed)
[facility]. Gave report to receiving RN.

## 2015-01-22 NOTE — Clinical Social Work Note (Signed)
Patient will discharge today per MD order. [facility] RN to call report prior to transportation to [phone] Transportation: [facility]- to be scheduled after sister signs paperwork at 1:30pm  CSW sent discharge summary to SNF for review.  Packet is complete.  RN, patient and family aware of discharge plans.  Vickii PennaGina Analiah Drum, LCSWA [phone]  Psychiatric & Orthopedics (5N 1-16) Clinical Social Worker     CSW spoke with patient's sister, per patient's request.  S[facility] will sign [facility]:30 [facility].  [facility] MD order.  Vickii PennaGina Adolphe Fortunato, LCSW [phone]  Psychiatric & Orthopedics (5N 1-8) Clinical Social Worker

## 2015-01-22 NOTE — Progress Notes (Signed)
Physical Therapy Treatment Patient Details Name: [name] [name] MRN: [id number] DOB: 12/23/1926 Today's Date: 01/22/2015    History of Present Illness Pt is a 81 y/o F s/p fall, R femur fx and now R ORIF.  Pt w/ recent admission 4/16 2/2 R hip fx w/ surgical repair.  Pt's PMH includes fatigue, anxiety, dizziness, CAD, HTN, syncope and collapse, memory disorder, seizure disorder, depression, chronic diastolic CHF, COPD, CKD stage III, and R bipolar hip arthroplasty.    PT Comments    Pt required min assist to adhere to RLE NWB status during sit>stand and stand pivot transfers.  Pt will benefit from continued skilled PT services to increase functional independence and safety.  Follow Up Recommendations  SNF     Equipment Recommendations  Other (comment) (TBD by next venue of care)    Recommendations for Other Services       Precautions / Restrictions Precautions Precautions: Fall Restrictions Weight Bearing Restrictions: Yes RLE Weight Bearing: Non weight bearing    Mobility  Bed Mobility Overal bed mobility: Needs Assistance Bed Mobility: Supine to Sit     Supine to sit: HOB elevated;Mod assist     General bed mobility comments: Mod assist managing BLEs and using bed pad to scoot pt's hips to sitting EOB.  Pt w/ use of bed rails.  Transfers Overall transfer level: Needs assistance Equipment used: Rolling walker (2 wheeled) Transfers: Sit to/from Stand Sit to Stand: Min assist;+2 safety/equipment Stand pivot transfers: +2 safety/equipment;Min assist       General transfer comment: Min assist for sit>stand to push up from sitting.  Additionally, pt required min assist to keep RLE from touching floor during sit>stand and stand pivot.  Ambulation/Gait             General Gait Details: pivotal steps only   Careers information officer    Modified Rankin (Stroke Patients Only)       Balance Overall balance assessment: Needs  assistance Sitting-balance support: Bilateral upper extremity supported;Feet supported Sitting balance-Leahy Scale: Fair     Standing balance support: Bilateral upper extremity supported;During functional activity Standing balance-Leahy Scale: Fair                      Cognition Arousal/Alertness: Awake/alert Behavior During Therapy: WFL for tasks assessed/performed Overall Cognitive Status: History of cognitive impairments - at baseline                      Exercises Total Joint Exercises Ankle Circles/Pumps: AROM;Both;20 reps;Supine Quad Sets: AROM;Both;5 reps;Supine Hip ABduction/ADduction: AAROM;Right;10 reps;Supine Straight Leg Raises: AAROM;Right;5 reps;Supine Long Arc Quad: AROM;Both;15 reps;Seated    General Comments        Pertinent Vitals/Pain Pain Assessment: Faces Faces Pain Scale: Hurts little more Pain Location: R hip Pain Descriptors / Indicators: Aching;Grimacing;Guarding Pain Intervention(s): Limited activity within patient's tolerance;Monitored during session;Repositioned    Home Living                      Prior Function            PT Goals (current goals can now be found in the care plan section) Acute Rehab PT Goals Patient Stated Goal: none stated Progress towards PT goals: Progressing toward goals    Frequency  Min 3X/week    PT Plan Current plan remains appropriate    Co-evaluation  End of Session Equipment Utilized During Treatment: Gait belt;Oxygen Activity Tolerance: Patient limited by fatigue Patient left: in chair;with call bell/phone within reach;with chair alarm set     Time: 1125-1140 PT Time Calculation (min) (ACUTE ONLY): 15 min  Charges:  $Therapeutic Exercise: 8-22 mins                    G Codes:      Michail JewelsAshley Parr PT, [place]PT [phone] Pager: [phone] 01/22/2015, 3:09 PM

## 2015-01-22 NOTE — Discharge Instructions (Signed)
Keep dressing clean and dry till follow up.  ASA 325mg  daily for DVT prophylaxis  NWB in the RLE

## 2015-02-09 ENCOUNTER — Encounter (HOSPITAL_COMMUNITY): Payer: Self-pay | Admitting: Orthopedic Surgery

## 2015-05-28 ENCOUNTER — Emergency Department (HOSPITAL_COMMUNITY)
Admission: EM | Admit: 2015-05-28 | Discharge: 2015-05-28 | Disposition: A | Discharge status: Home / Self Care | Payer: Medicare Other | Attending: Emergency Medicine | Admitting: Emergency Medicine

## 2015-05-28 ENCOUNTER — Encounter (HOSPITAL_COMMUNITY): Payer: Self-pay | Admitting: Emergency Medicine

## 2015-05-28 ENCOUNTER — Emergency Department (HOSPITAL_COMMUNITY): Payer: Medicare Other

## 2015-05-28 DIAGNOSIS — J449 Chronic obstructive pulmonary disease, unspecified: Secondary | ICD-10-CM | POA: Diagnosis not present

## 2015-05-28 DIAGNOSIS — Z7902 Long term (current) use of antithrombotics/antiplatelets: Secondary | ICD-10-CM | POA: Insufficient documentation

## 2015-05-28 DIAGNOSIS — Z9889 Other specified postprocedural states: Secondary | ICD-10-CM | POA: Diagnosis not present

## 2015-05-28 DIAGNOSIS — D649 Anemia, unspecified: Secondary | ICD-10-CM | POA: Insufficient documentation

## 2015-05-28 DIAGNOSIS — F419 Anxiety disorder, unspecified: Secondary | ICD-10-CM | POA: Insufficient documentation

## 2015-05-28 DIAGNOSIS — R05 Cough: Secondary | ICD-10-CM | POA: Diagnosis present

## 2015-05-28 DIAGNOSIS — I5032 Chronic diastolic (congestive) heart failure: Secondary | ICD-10-CM | POA: Insufficient documentation

## 2015-05-28 DIAGNOSIS — Z7982 Long term (current) use of aspirin: Secondary | ICD-10-CM | POA: Insufficient documentation

## 2015-05-28 DIAGNOSIS — N183 Chronic kidney disease, stage 3 (moderate): Secondary | ICD-10-CM | POA: Diagnosis not present

## 2015-05-28 DIAGNOSIS — F329 Major depressive disorder, single episode, unspecified: Secondary | ICD-10-CM | POA: Insufficient documentation

## 2015-05-28 DIAGNOSIS — Z79899 Other long term (current) drug therapy: Secondary | ICD-10-CM | POA: Insufficient documentation

## 2015-05-28 DIAGNOSIS — Z87891 Personal history of nicotine dependence: Secondary | ICD-10-CM | POA: Insufficient documentation

## 2015-05-28 DIAGNOSIS — R059 Cough, unspecified: Secondary | ICD-10-CM

## 2015-05-28 DIAGNOSIS — E785 Hyperlipidemia, unspecified: Secondary | ICD-10-CM | POA: Insufficient documentation

## 2015-05-28 DIAGNOSIS — I129 Hypertensive chronic kidney disease with stage 1 through stage 4 chronic kidney disease, or unspecified chronic kidney disease: Secondary | ICD-10-CM | POA: Insufficient documentation

## 2015-05-28 DIAGNOSIS — I2510 Atherosclerotic heart disease of native coronary artery without angina pectoris: Secondary | ICD-10-CM | POA: Diagnosis not present

## 2015-05-28 DIAGNOSIS — K219 Gastro-esophageal reflux disease without esophagitis: Secondary | ICD-10-CM | POA: Insufficient documentation

## 2015-05-28 LAB — BASIC METABOLIC PANEL
Anion gap: 11 (ref 5–15)
BUN: 14 mg/dL (ref 6–20)
CO2: 26 mmol/L (ref 22–32)
Calcium: 9.6 mg/dL (ref 8.9–10.3)
Chloride: 101 mmol/L (ref 101–111)
Creatinine, Ser: 1.09 mg/dL — ABNORMAL HIGH (ref 0.44–1.00)
GFR calc Af Amer: 52 mL/min — ABNORMAL LOW (ref >60)
GFR calc non Af Amer: 45 mL/min — ABNORMAL LOW (ref >60)
GLUCOSE: 146 mg/dL — AB (ref 65–99)
POTASSIUM: 3.3 mmol/L — AB (ref 3.5–5.1)
Sodium: 138 mmol/L (ref 135–145)

## 2015-05-28 LAB — CBC WITH DIFFERENTIAL/PLATELET
Basophils Absolute: 0 10*3/uL (ref 0.0–0.1)
Basophils Relative: 0 %
Eosinophils Absolute: 0 10*3/uL (ref 0.0–0.7)
Eosinophils Relative: 0 %
HEMATOCRIT: 36.1 % (ref 36.0–46.0)
HEMOGLOBIN: 11.9 g/dL — AB (ref 12.0–15.0)
LYMPHS PCT: 17 %
Lymphs Abs: 2.6 10*3/uL (ref 0.7–4.0)
MCH: 30.1 pg (ref 26.0–34.0)
MCHC: 33 g/dL (ref 30.0–36.0)
MCV: 91.4 fL (ref 78.0–100.0)
Monocytes Absolute: 1.1 10*3/uL — ABNORMAL HIGH (ref 0.1–1.0)
Monocytes Relative: 7 %
Neutro Abs: 11.4 10*3/uL — ABNORMAL HIGH (ref 1.7–7.7)
Neutrophils Relative %: 76 %
Platelets: 365 10*3/uL (ref 150–400)
RBC: 3.95 MIL/uL (ref 3.87–5.11)
RDW: 13.3 % (ref 11.5–15.5)
WBC: 15.1 10*3/uL — AB (ref 4.0–10.5)

## 2015-05-28 MED ORDER — BENZONATATE 100 MG PO CAPS: 100.0000 mg | ORAL_CAPSULE | Freq: Three times a day (TID) | ORAL | Status: DC

## 2015-05-28 MED ORDER — ALBUTEROL SULFATE HFA 108 (90 BASE) MCG/ACT IN AERS: 2.0000 | INHALATION_SPRAY | RESPIRATORY_TRACT | Status: DC | PRN

## 2015-05-28 MED ORDER — ALBUTEROL SULFATE HFA 108 (90 BASE) MCG/ACT IN AERS
2.0000 | INHALATION_SPRAY | RESPIRATORY_TRACT | Status: DC
  Administered 2015-05-28: 2 via RESPIRATORY_TRACT
  Filled 2015-05-28: qty 6.7

## 2015-05-28 MED ORDER — BENZONATATE 100 MG PO CAPS
200.0000 mg | ORAL_CAPSULE | Freq: Once | ORAL | Status: AC
  Administered 2015-05-28: 200 mg via ORAL
  Filled 2015-05-28: qty 2

## 2015-05-28 NOTE — ED Notes (Signed)
Attempted to call report to [facility].  Left on hold for 15 minutes. No one came to phone.

## 2015-05-28 NOTE — ED Notes (Signed)
Oxygen decreased to 2L [place]-o2 saturation @ 98%.

## 2015-05-28 NOTE — ED Provider Notes (Signed)
CSN: [id number]     Arrival date & time 05/28/15  1702 History   First MD Initiated Contact with Patient 05/28/15 1717     Chief Complaint  Patient presents with  . Cough    HPI Patient presents the emergency department complaining of 24 hours of cough.  She reports the cough is occasionally productive.  No fevers or chills.  She denies shortness of breath.  No chest pain.  Denies abdominal pain.  No nausea vomiting or diarrhea.  She was sent to the emergency department from her nursing facility.   Past Medical History  Diagnosis Date  . Fatigue   . Anxiety   . Dizziness   . CAD (coronary artery disease)     a. Cath 2004: 70% ostial LAD, 50-60% prox ramus disease, treated medically. b. Nuc 12/2009 - EF 74%, normal study.  . Hypertension   . Hyperlipidemia   . Coronary artery disease   . Anemia     a. Chronic iron deficiency anemia  . Syncope and collapse     RECURRENT, RELATED TO HYPONATREMIA, NARCOTICS, AND BRADYCARDIA  . Tobacco abuse     PAST  . Memory disorder   . Seizure disorder (HCC)   . Depression   . Chronic diastolic CHF (congestive heart failure) (HCC)     a. Echo 02/2014: mild focal basal hypertrophy of septum, Normal LV function EF 55-60%; grade 1 diastolic dysfunction; mildly elevated PASP.  Marland Kitchen GERD (gastroesophageal reflux disease)   . COPD (chronic obstructive pulmonary disease) (HCC)   . CKD (chronic kidney disease), stage III    Past Surgical History  Procedure Laterality Date  . Cardiac catheterization  2004  . Abdominal hysterectomy      early 1970's for fibroid  . Hip arthroplasty Right 12/05/2014    Procedure: ARTHROPLASTY BIPOLAR HIP;  Surgeon: Jodi Geralds, MD;  Location: [facility] OR;  Service: Orthopedics;  Laterality: Right;  . Orif periprosthetic fracture Right 01/18/2015    Procedure: OPEN REDUCTION INTERNAL FIXATION (ORIF) PERIPROSTHETIC FRACTURE;  Surgeon: Sheral Apley, MD;  Location: [facility] OR;  Service: Orthopedics;  Laterality: Right;   Family  History  Problem Relation Age of Onset  . Heart attack Father   . Heart failure Mother   . CAD Father   . Congestive Heart Failure Mother   . Other Brother     cerebral hemmorhage  . Coronary artery disease Sister    Social History  Substance Use Topics  . Smoking status: Former Smoker -- 1.00 packs/day for 40 years    Quit date: 08/23/1999  . Smokeless tobacco: Never Used  . Alcohol Use: No   OB History    No data available     Review of Systems  All other systems reviewed and are negative.     Allergies  Codeine  Home Medications   Prior to Admission medications   Medication Sig Start Date End Date Taking? Authorizing Provider  acetaminophen (TYLENOL) 500 MG tablet Take 1,000 mg by mouth every 6 (six) hours as needed. For pain.   Yes Historical Provider, MD  aspirin EC 325 MG EC tablet Take 1 tablet (325 mg total) by mouth daily with breakfast. 01/21/15  Yes Calvert Cantor, MD  atorvastatin (LIPITOR) 10 MG tablet Take 10 mg by mouth daily. 01/16/15  Yes Historical Provider, MD  Calcium Carbonate-Vitamin D (CALCIUM 600 + D PO) Take 1 tablet by mouth daily.    Yes Historical Provider, MD  Cholecalciferol (VITAMIN D PO) Take 1,000 Units  by mouth daily.    Yes Historical Provider, MD  clopidogrel (PLAVIX) 75 MG tablet Take 75 mg by mouth daily.   Yes Historical Provider, MD  docusate sodium (COLACE) 100 MG capsule Take 1 capsule (100 mg total) by mouth 2 (two) times daily. 01/21/15  Yes Calvert Cantor, MD  escitalopram (LEXAPRO) 10 MG tablet Take 10 mg by mouth daily.   Yes Historical Provider, MD  esomeprazole (NEXIUM) 40 MG capsule Take 40 mg by mouth every morning. Before breakfast. 11/26/14  Yes Historical Provider, MD  feeding supplement, ENSURE ENLIVE, (ENSURE ENLIVE) LIQD Take 237 mLs by mouth 2 (two) times daily between meals. 01/21/15  Yes Calvert Cantor, MD  ferrous sulfate 325 (65 FE) MG tablet Take 1 tablet (325 mg total) by mouth 3 (three) times daily with meals. 01/21/15  Yes  Calvert Cantor, MD  furosemide (LASIX) 20 MG tablet Take 1 tablet (20 mg total) by mouth daily. 01/21/15  Yes Calvert Cantor, MD  HYDROcodone-acetaminophen (NORCO) 5-325 MG per tablet Take 1-2 tablets by mouth every 6 (six) hours as needed for moderate pain. 01/22/15  Yes Brittney Tresa Endo, PA-C  isosorbide mononitrate (IMDUR) 30 MG 24 hr tablet Take 30 mg by mouth daily.   Yes Historical Provider, MD  levETIRAcetam (KEPPRA) 750 MG tablet Take 1 tablet (750 mg total) by mouth every 12 (twelve) hours. 01/22/15  Yes Calvert Cantor, MD  LORazepam (ATIVAN) 0.5 MG tablet Take 0.5 mg by mouth every 6 (six) hours as needed for anxiety.  01/04/15  Yes Historical Provider, MD  nitroGLYCERIN (NITROSTAT) 0.4 MG SL tablet Place 0.4 mg under the tongue every 5 (five) minutes as needed for chest pain.   Yes Historical Provider, MD  ondansetron (ZOFRAN ODT) 4 MG disintegrating tablet Take 1 tablet (4 mg total) by mouth every 8 (eight) hours as needed for nausea or vomiting. 08/21/14  Yes [name], DO  pregabalin (LYRICA) 75 MG capsule Take 75 mg by mouth at bedtime. 11/27/14  Yes Historical Provider, MD  tiotropium (SPIRIVA) 18 MCG inhalation capsule Place 18 mcg into inhaler and inhale daily. 11/26/14  Yes Historical Provider, MD                 BP 155/85 mmHg  Pulse 116  Temp(Src) 98.2 F (36.8 C) (Oral)  Resp 16  SpO2 88% Physical Exam  Constitutional: She is oriented to person, place, and time. She appears well-developed and well-nourished. No distress.  HENT:  Head: Normocephalic and atraumatic.  Eyes: EOM are normal.  Neck: Normal range of motion.  Cardiovascular: Normal rate, regular rhythm and normal heart sounds.   Pulmonary/Chest: Effort normal and breath sounds normal.  Abdominal: Soft. She exhibits no distension. There is no tenderness.  Musculoskeletal: Normal range of motion.  Neurological: She is alert and oriented to person, place, and time.  Skin: Skin is warm and dry.  Psychiatric: She has a  normal mood and affect. Judgment normal.  Nursing note and vitals reviewed.   ED Course  Procedures (including critical care time) Labs Review Labs Reviewed  CBC WITH DIFFERENTIAL/PLATELET - Abnormal; Notable for the following:    WBC 15.1 (*)    Hemoglobin 11.9 (*)    Neutro Abs 11.4 (*)    Monocytes Absolute 1.1 (*)    All other components within normal limits  BASIC METABOLIC PANEL - Abnormal; Notable for the following:    Potassium 3.3 (*)    Glucose, Bld 146 (*)    Creatinine, Ser 1.09 (*)  GFR calc non Af Amer 45 (*)    GFR calc Af Amer 52 (*)    All other components within normal limits    Imaging Review Dg Chest 2 View  05/28/2015   CLINICAL DATA:  Nonproductive cough for 1 day.  EXAM: CHEST  2 VIEW  COMPARISON:  01/18/2015  FINDINGS: There is hyperinflation. There is emphysematous change in the upper lobes. Heart size is normal. There is no evidence of acute infiltrate or CHF. There is no pleural effusion. There is a hiatal hernia.  IMPRESSION: Hyperinflation.  Hiatal hernia.  No acute cardiopulmonary findings.   Electronically Signed   By: Ellery Plunk M.D.   On: 05/28/2015 18:29   I have personally reviewed and evaluated these images and lab results as part of my medical decision-making.   EKG Interpretation None      MDM   Final diagnoses:  Cough    Patient is overall well-appearing.  She feels much better this time.  Labs without significant abnormality.  Chest x-ray demonstrates no infiltrate.  Doubt pulmonary embolism.  Discharge home in good condition.    Azalia Bilis, MD 05/28/15 2017

## 2015-05-28 NOTE — Discharge Instructions (Signed)

## 2015-05-28 NOTE — ED Notes (Signed)
Per EMS- c/o non-productive cough x1 day. No associated fever/chills. SpO2 low 90s. On 4L nasal cannula-SpO2 up to 96%. No wheezing noted. Pt is from [facility]. VSS.

## 2015-05-28 NOTE — ED Notes (Signed)
Bed: [facility] Expected date:  Expected time:  Means of arrival:  Comments: EMS/21F/cough

## 2016-07-12 IMAGING — CR DG CHEST 2V
2 series · 2 of 2 positions shown · non-contrast
Comparison: 04/08/2014

CLINICAL DATA: The patient started having SOB today has been having
it since yesterday. SOB and body aches.

EXAM:
CHEST  2 VIEW

[w chest pa]
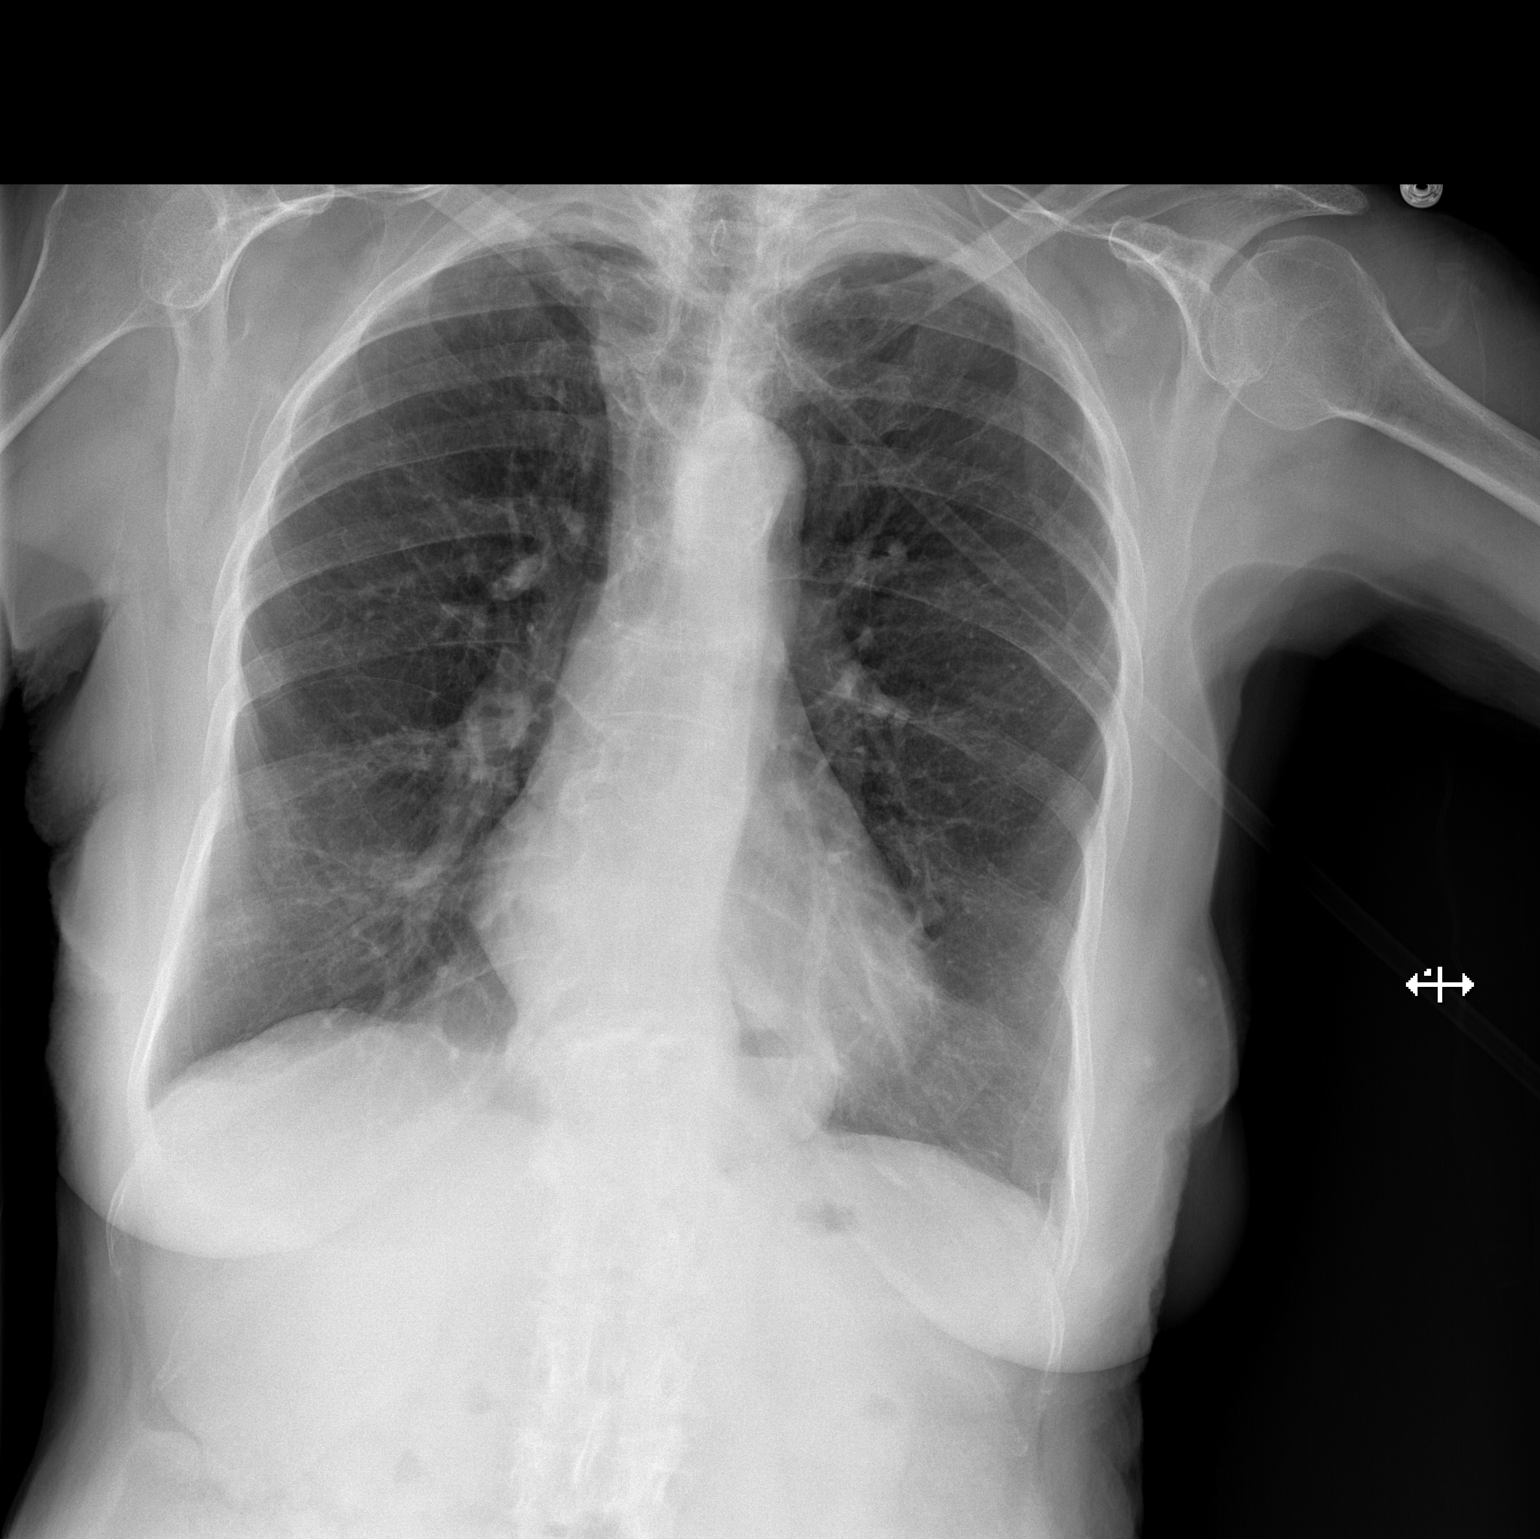

[w chest lat]
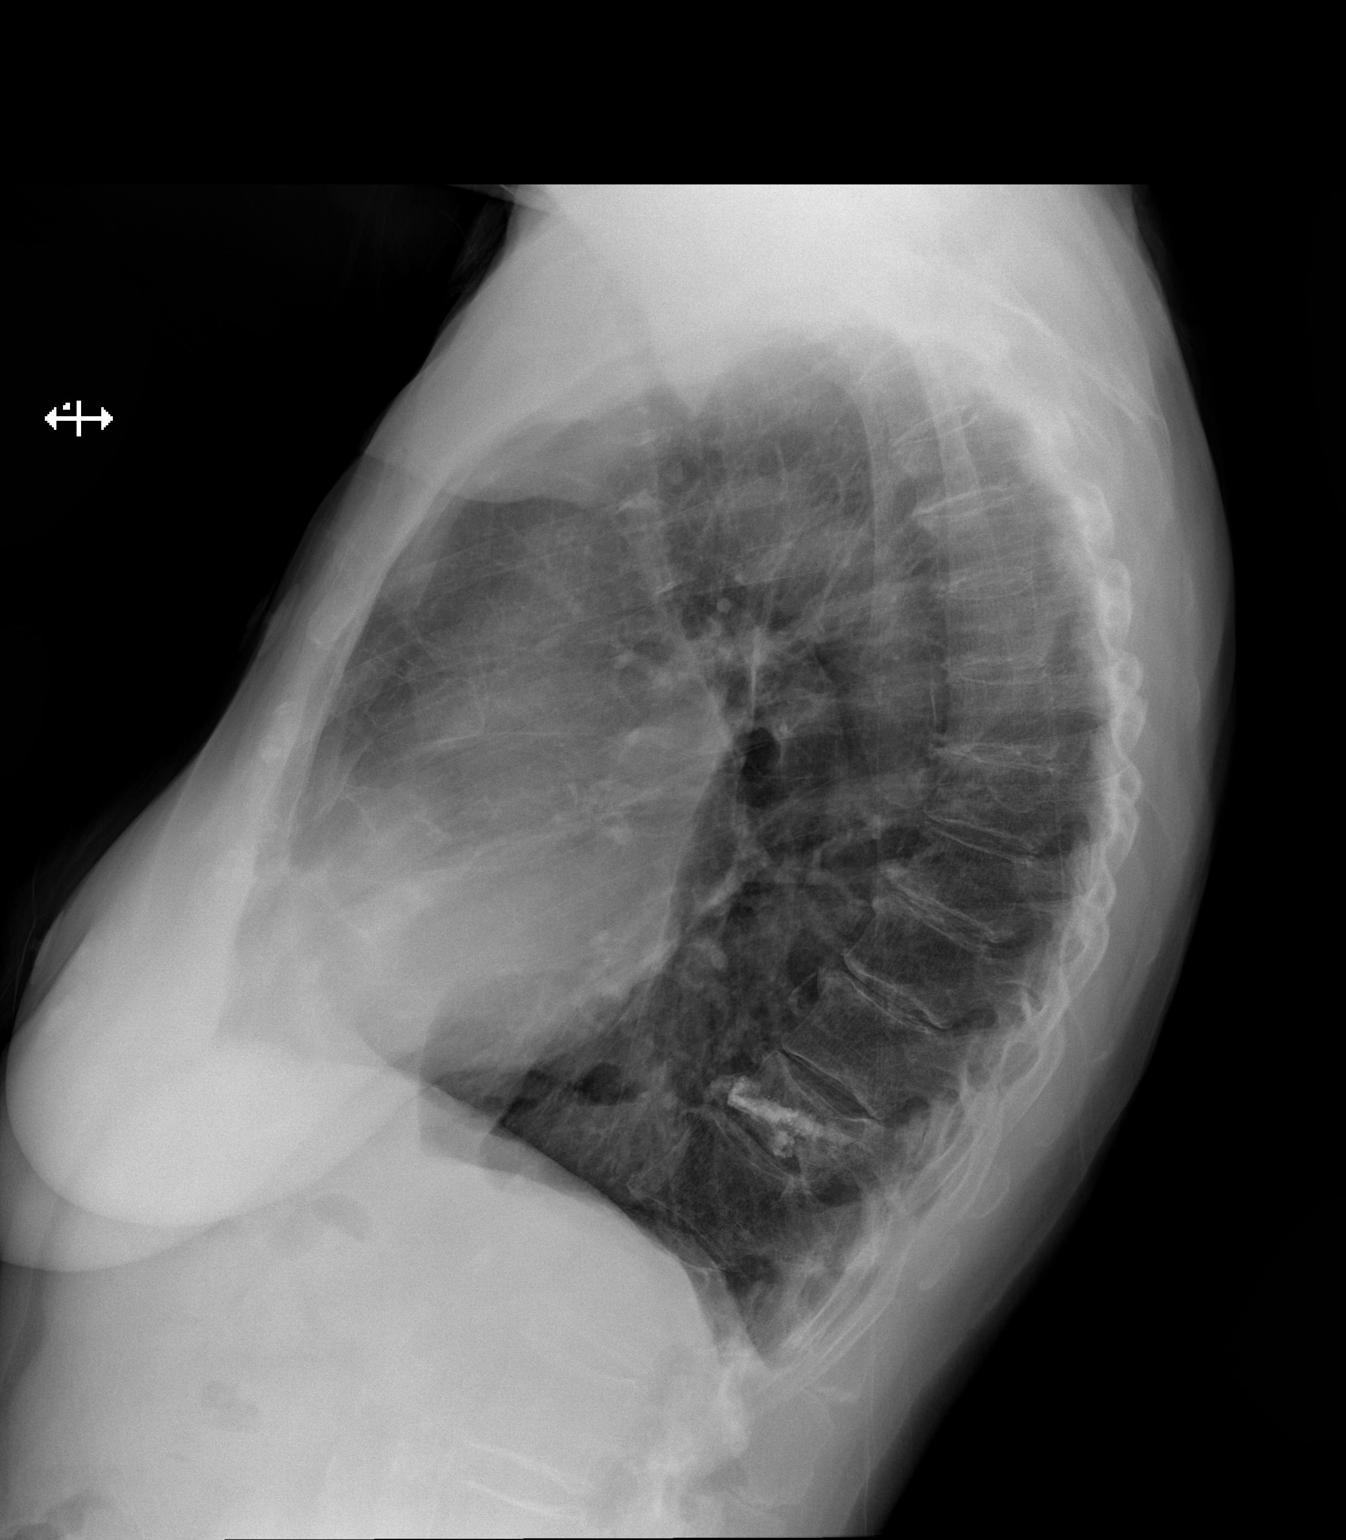

[2 of 2 positions shown; findings below may reference images not displayed]

FINDINGS: Stable cardiac size and mediastinal contours. Stable lung volumes.
Chronic small to moderate hiatal hernia. No pneumothorax, edema,
effusions or acute pulmonary opacity. Stable visualized osseous
structures.
IMPRESSION: No active cardiopulmonary disease.

## 2016-08-12 ENCOUNTER — Encounter (HOSPITAL_COMMUNITY): Payer: Self-pay

## 2016-08-12 ENCOUNTER — Emergency Department (HOSPITAL_COMMUNITY): Payer: Medicare Other

## 2016-08-12 ENCOUNTER — Inpatient Hospital Stay (HOSPITAL_COMMUNITY)
Admission: EM | Admit: 2016-08-12 | Discharge: 2016-08-17 | DRG: 683 | Disposition: A | Discharge status: Skilled Nursing Facility | Payer: Medicare Other | Attending: Internal Medicine | Admitting: Internal Medicine

## 2016-08-12 DIAGNOSIS — R0600 Dyspnea, unspecified: Secondary | ICD-10-CM

## 2016-08-12 DIAGNOSIS — R6510 Systemic inflammatory response syndrome (SIRS) of non-infectious origin without acute organ dysfunction: Secondary | ICD-10-CM | POA: Diagnosis present

## 2016-08-12 DIAGNOSIS — N183 Chronic kidney disease, stage 3 unspecified: Secondary | ICD-10-CM | POA: Diagnosis present

## 2016-08-12 DIAGNOSIS — R4182 Altered mental status, unspecified: Secondary | ICD-10-CM | POA: Diagnosis not present

## 2016-08-12 DIAGNOSIS — E785 Hyperlipidemia, unspecified: Secondary | ICD-10-CM | POA: Diagnosis present

## 2016-08-12 DIAGNOSIS — I248 Other forms of acute ischemic heart disease: Secondary | ICD-10-CM | POA: Diagnosis present

## 2016-08-12 DIAGNOSIS — F419 Anxiety disorder, unspecified: Secondary | ICD-10-CM | POA: Diagnosis present

## 2016-08-12 DIAGNOSIS — R000 Tachycardia, unspecified: Secondary | ICD-10-CM | POA: Diagnosis present

## 2016-08-12 DIAGNOSIS — I10 Essential (primary) hypertension: Secondary | ICD-10-CM | POA: Diagnosis present

## 2016-08-12 DIAGNOSIS — I130 Hypertensive heart and chronic kidney disease with heart failure and stage 1 through stage 4 chronic kidney disease, or unspecified chronic kidney disease: Secondary | ICD-10-CM | POA: Diagnosis present

## 2016-08-12 DIAGNOSIS — Z993 Dependence on wheelchair: Secondary | ICD-10-CM

## 2016-08-12 DIAGNOSIS — N179 Acute kidney failure, unspecified: Secondary | ICD-10-CM | POA: Diagnosis not present

## 2016-08-12 DIAGNOSIS — R296 Repeated falls: Secondary | ICD-10-CM

## 2016-08-12 DIAGNOSIS — E860 Dehydration: Secondary | ICD-10-CM | POA: Diagnosis not present

## 2016-08-12 DIAGNOSIS — J449 Chronic obstructive pulmonary disease, unspecified: Secondary | ICD-10-CM | POA: Diagnosis present

## 2016-08-12 DIAGNOSIS — Z7982 Long term (current) use of aspirin: Secondary | ICD-10-CM

## 2016-08-12 DIAGNOSIS — G309 Alzheimer's disease, unspecified: Secondary | ICD-10-CM | POA: Diagnosis present

## 2016-08-12 DIAGNOSIS — K219 Gastro-esophageal reflux disease without esophagitis: Secondary | ICD-10-CM | POA: Diagnosis present

## 2016-08-12 DIAGNOSIS — Z90710 Acquired absence of both cervix and uterus: Secondary | ICD-10-CM

## 2016-08-12 DIAGNOSIS — Z8249 Family history of ischemic heart disease and other diseases of the circulatory system: Secondary | ICD-10-CM

## 2016-08-12 DIAGNOSIS — F329 Major depressive disorder, single episode, unspecified: Secondary | ICD-10-CM | POA: Diagnosis present

## 2016-08-12 DIAGNOSIS — G40909 Epilepsy, unspecified, not intractable, without status epilepticus: Secondary | ICD-10-CM

## 2016-08-12 DIAGNOSIS — Z79899 Other long term (current) drug therapy: Secondary | ICD-10-CM

## 2016-08-12 DIAGNOSIS — D509 Iron deficiency anemia, unspecified: Secondary | ICD-10-CM | POA: Diagnosis present

## 2016-08-12 DIAGNOSIS — Z7902 Long term (current) use of antithrombotics/antiplatelets: Secondary | ICD-10-CM

## 2016-08-12 DIAGNOSIS — I5032 Chronic diastolic (congestive) heart failure: Secondary | ICD-10-CM | POA: Diagnosis present

## 2016-08-12 DIAGNOSIS — I2510 Atherosclerotic heart disease of native coronary artery without angina pectoris: Secondary | ICD-10-CM | POA: Diagnosis present

## 2016-08-12 DIAGNOSIS — R05 Cough: Secondary | ICD-10-CM

## 2016-08-12 DIAGNOSIS — Z885 Allergy status to narcotic agent status: Secondary | ICD-10-CM

## 2016-08-12 DIAGNOSIS — I4891 Unspecified atrial fibrillation: Secondary | ICD-10-CM | POA: Diagnosis present

## 2016-08-12 DIAGNOSIS — F0280 Dementia in other diseases classified elsewhere without behavioral disturbance: Secondary | ICD-10-CM | POA: Diagnosis present

## 2016-08-12 DIAGNOSIS — R059 Cough, unspecified: Secondary | ICD-10-CM

## 2016-08-12 DIAGNOSIS — Z96641 Presence of right artificial hip joint: Secondary | ICD-10-CM | POA: Diagnosis present

## 2016-08-12 DIAGNOSIS — Z9181 History of falling: Secondary | ICD-10-CM

## 2016-08-12 DIAGNOSIS — Z87891 Personal history of nicotine dependence: Secondary | ICD-10-CM

## 2016-08-12 DIAGNOSIS — D72829 Elevated white blood cell count, unspecified: Secondary | ICD-10-CM | POA: Diagnosis present

## 2016-08-12 LAB — COMPREHENSIVE METABOLIC PANEL
ALT: 13 U/L — ABNORMAL LOW (ref 14–54)
ANION GAP: 13 (ref 5–15)
AST: 23 U/L (ref 15–41)
Albumin: 3.7 g/dL (ref 3.5–5.0)
Alkaline Phosphatase: 46 U/L (ref 38–126)
BUN: 46 mg/dL — ABNORMAL HIGH (ref 6–20)
CHLORIDE: 96 mmol/L — AB (ref 101–111)
CO2: 27 mmol/L (ref 22–32)
Calcium: 9.7 mg/dL (ref 8.9–10.3)
Creatinine, Ser: 1.7 mg/dL — ABNORMAL HIGH (ref 0.44–1.00)
GFR, EST AFRICAN AMERICAN: 30 mL/min — AB (ref >60)
GFR, EST NON AFRICAN AMERICAN: 26 mL/min — AB (ref >60)
Glucose, Bld: 172 mg/dL — ABNORMAL HIGH (ref 65–99)
Potassium: 3.7 mmol/L (ref 3.5–5.1)
Sodium: 136 mmol/L (ref 135–145)
Total Bilirubin: 0.5 mg/dL (ref 0.3–1.2)
Total Protein: 6.6 g/dL (ref 6.5–8.1)

## 2016-08-12 LAB — URINALYSIS, ROUTINE W REFLEX MICROSCOPIC
BILIRUBIN URINE: NEGATIVE
Bacteria, UA: NONE SEEN
GLUCOSE, UA: NEGATIVE mg/dL
KETONES UR: NEGATIVE mg/dL
Leukocytes, UA: NEGATIVE
NITRITE: NEGATIVE
PH: 5 (ref 5.0–8.0)
Protein, ur: NEGATIVE mg/dL
Specific Gravity, Urine: 1.018 (ref 1.005–1.030)

## 2016-08-12 LAB — CBC
HCT: 40.6 % (ref 36.0–46.0)
Hemoglobin: 13.9 g/dL (ref 12.0–15.0)
MCH: 31.8 pg (ref 26.0–34.0)
MCHC: 34.2 g/dL (ref 30.0–36.0)
MCV: 92.9 fL (ref 78.0–100.0)
Platelets: 375 10*3/uL (ref 150–400)
RBC: 4.37 MIL/uL (ref 3.87–5.11)
RDW: 12.3 % (ref 11.5–15.5)
WBC: 18.1 10*3/uL — AB (ref 4.0–10.5)

## 2016-08-12 LAB — I-STAT CG4 LACTIC ACID, ED: Lactic Acid, Venous: 2.87 mmol/L (ref 0.5–1.9)

## 2016-08-12 LAB — I-STAT TROPONIN, ED: Troponin i, poc: 0.05 ng/mL (ref 0.00–0.08)

## 2016-08-12 MED ORDER — SODIUM CHLORIDE 0.9 % IV BOLUS (SEPSIS)
1000.0000 mL | Freq: Once | INTRAVENOUS | Status: AC
  Administered 2016-08-12: 1000 mL via INTRAVENOUS

## 2016-08-12 NOTE — ED Provider Notes (Signed)
[facility]-EMERGENCY DEPT Provider Note   CSN: [id number] Arrival date & time: 08/12/16  1849     History   Chief Complaint Chief Complaint  Patient presents with  . Altered Mental Status    HPI [name] [name] is a 82 y.o. female with history of falls, COPD,CKD, CHF brought in by EMS for altered mental status since just PTA. Patient does not remember falling or any reason why she is here. Patient denies any head pain, neck pain, central spine pain, chest pain, or any pain whatsoever. Patient denies shortness of breath. She does not know where she is at this time but knows her name and her family members beside her. More history provided by representative of [facility]: Representative states that she was found sliding down her bed and a resident helped her down to the floor. She denies the patient having any injury in the process. She states that has been recently vomiting about every other day with no evidence of blood. She states that the patient normally ambulates with a wheelchair. The representative denied patient having any changes in medication, fevers, chills, seizures. Representative states that she had her vitals taken last night which were normal for her.   The history is provided by the patient, the nursing home and a relative. The history is limited by the condition of the patient.  Altered Mental Status      Past Medical History:  Diagnosis Date  . Anemia    a. Chronic iron deficiency anemia  . Anxiety   . CAD (coronary artery disease)    a. Cath 2004: 70% ostial LAD, 50-60% prox ramus disease, treated medically. b. Nuc 12/2009 - EF 74%, normal study.  . Chronic diastolic CHF (congestive heart failure) (HCC)    a. Echo 02/2014: mild focal basal hypertrophy of septum, Normal LV function EF 55-60%; grade 1 diastolic dysfunction; mildly elevated PASP.  Marland Kitchen. CKD (chronic kidney disease), stage III   . COPD (chronic obstructive pulmonary disease) (HCC)   . Coronary artery  disease   . Depression   . Dizziness   . Fatigue   . GERD (gastroesophageal reflux disease)   . Hyperlipidemia   . Hypertension   . Memory disorder   . Seizure disorder (HCC)   . Syncope and collapse    RECURRENT, RELATED TO HYPONATREMIA, NARCOTICS, AND BRADYCARDIA  . Tobacco abuse    PAST    Patient Active Problem List   Diagnosis Date Noted  . Malnutrition of moderate degree (HCC) 01/19/2015  . Femur fracture (HCC) 01/18/2015  . Recurrent falls 01/18/2015  . Acute renal failure (HCC) 01/18/2015  . Dehydration with hyponatremia 01/18/2015  . FTT (failure to thrive) in adult 01/18/2015  . Severe protein-calorie malnutrition (HCC) 01/18/2015  . Hypokalemia 01/18/2015  . Periprosthetic fracture around internal prosthetic joint 01/18/2015  . Closed femur fracture (HCC)   . Fracture of femoral neck, right (HCC) 12/05/2014  . CKD (chronic kidney disease) stage 3, GFR 30-59 ml/min 12/05/2014  . Diastolic CHF, chronic (HCC) 11/17/2011  . GERD (gastroesophageal reflux disease) 09/19/2011  . Orthostatic hypotension 05/11/2011  . Anxiety   . Seizure disorder (HCC)   . Hypertension   . Hyperlipidemia   . Coronary artery disease   . Anemia   . Tobacco abuse   . Memory disorder     Past Surgical History:  Procedure Laterality Date  . ABDOMINAL HYSTERECTOMY     early 1970's for fibroid  . CARDIAC CATHETERIZATION  2004  . HIP  ARTHROPLASTY Right 12/05/2014   Procedure: ARTHROPLASTY BIPOLAR HIP;  Surgeon: Jodi GeraldsJohn Graves, MD;  Location: [facility] OR;  Service: Orthopedics;  Laterality: Right;  . ORIF PERIPROSTHETIC FRACTURE Right 01/18/2015   Procedure: OPEN REDUCTION INTERNAL FIXATION (ORIF) PERIPROSTHETIC FRACTURE;  Surgeon: Sheral Apleyimothy D Murphy, MD;  Location: [facility] OR;  Service: Orthopedics;  Laterality: Right;    OB History    No data available       Home Medications    Prior to Admission medications   Medication Sig Start Date End Date Taking? Authorizing Provider  acetaminophen  (TYLENOL) 500 MG tablet Take 1,000 mg by mouth every 6 (six) hours as needed. For pain.    Historical Provider, MD  albuterol (PROVENTIL HFA;VENTOLIN HFA) 108 (90 BASE) MCG/ACT inhaler Inhale 2 puffs into the lungs every 4 (four) hours as needed for wheezing or shortness of breath. 05/28/15   Azalia BilisKevin Campos, MD  aspirin EC 325 MG EC tablet Take 1 tablet (325 mg total) by mouth daily with breakfast. 01/21/15   Calvert CantorSaima Rizwan, MD  atorvastatin (LIPITOR) 10 MG tablet Take 10 mg by mouth daily. 01/16/15   Historical Provider, MD  benzonatate (TESSALON) 100 MG capsule Take 1 capsule (100 mg total) by mouth every 8 (eight) hours. 05/28/15   Azalia BilisKevin Campos, MD  Calcium Carbonate-Vitamin D (CALCIUM 600 + D PO) Take 1 tablet by mouth daily.     Historical Provider, MD  Cholecalciferol (VITAMIN D PO) Take 1,000 Units by mouth daily.     Historical Provider, MD  clopidogrel (PLAVIX) 75 MG tablet Take 75 mg by mouth daily.    Historical Provider, MD  docusate sodium (COLACE) 100 MG capsule Take 1 capsule (100 mg total) by mouth 2 (two) times daily. 01/21/15   Calvert CantorSaima Rizwan, MD  escitalopram (LEXAPRO) 10 MG tablet Take 10 mg by mouth daily.    Historical Provider, MD  esomeprazole (NEXIUM) 40 MG capsule Take 40 mg by mouth every morning. Before breakfast. 11/26/14   Historical Provider, MD  feeding supplement, ENSURE ENLIVE, (ENSURE ENLIVE) LIQD Take 237 mLs by mouth 2 (two) times daily between meals. 01/21/15   Calvert CantorSaima Rizwan, MD  ferrous sulfate 325 (65 FE) MG tablet Take 1 tablet (325 mg total) by mouth 3 (three) times daily with meals. 01/21/15   Calvert CantorSaima Rizwan, MD  furosemide (LASIX) 20 MG tablet Take 1 tablet (20 mg total) by mouth daily. 01/21/15   Calvert CantorSaima Rizwan, MD  HYDROcodone-acetaminophen (NORCO) 5-325 MG per tablet Take 1-2 tablets by mouth every 6 (six) hours as needed for moderate pain. 01/22/15   Brittney Tresa EndoKelly, PA-C  isosorbide mononitrate (IMDUR) 30 MG 24 hr tablet Take 30 mg by mouth daily.    Historical Provider, MD    levETIRAcetam (KEPPRA) 750 MG tablet Take 1 tablet (750 mg total) by mouth every 12 (twelve) hours. 01/22/15   Calvert CantorSaima Rizwan, MD  LORazepam (ATIVAN) 0.5 MG tablet Take 0.5 mg by mouth every 6 (six) hours as needed for anxiety.  01/04/15   Historical Provider, MD  nitroGLYCERIN (NITROSTAT) 0.4 MG SL tablet Place 0.4 mg under the tongue every 5 (five) minutes as needed for chest pain.    Historical Provider, MD  ondansetron (ZOFRAN ODT) 4 MG disintegrating tablet Take 1 tablet (4 mg total) by mouth every 8 (eight) hours as needed for nausea or vomiting. 08/21/14   [name], DO  pregabalin (LYRICA) 75 MG capsule Take 75 mg by mouth at bedtime. 11/27/14   Historical Provider, MD  tiotropium (SPIRIVA) 18  MCG inhalation capsule Place 18 mcg into inhaler and inhale daily. 11/26/14   Historical Provider, MD    Family History Family History  Problem Relation Age of Onset  . Heart attack Father   . CAD Father   . Heart failure Mother   . Congestive Heart Failure Mother   . Other Brother     cerebral hemmorhage  . Coronary artery disease Sister     Social History Social History  Substance Use Topics  . Smoking status: Former Smoker    Packs/day: 1.00    Years: 40.00    Quit date: 08/23/1999  . Smokeless tobacco: Never Used  . Alcohol use No     Allergies   Codeine   Review of Systems Review of Systems  Unable to perform ROS: Acuity of condition  Constitutional: Negative for chills and fever.  Respiratory: Negative for shortness of breath.   Cardiovascular: Negative for chest pain.  Gastrointestinal: Positive for vomiting. Negative for abdominal pain.  Musculoskeletal: Negative for back pain, neck pain and neck stiffness.  Skin: Negative for wound.     Physical Exam Updated Vital Signs BP 131/56 (BP Location: Right Arm)   Pulse 112   Temp 98.9 F (37.2 C) (Rectal)   Resp 18   SpO2 100%   Physical Exam  Constitutional: She appears well-developed and well-nourished.  HENT:   Head: Normocephalic and atraumatic.  Nose: Nose normal.  Mouth/Throat: Oropharynx is clear and moist.  No visual wound, deformity, or palpated tenderness to head, neck.  Eyes: Conjunctivae and EOM are normal. Pupils are equal, round, and reactive to light.  Neck: Normal range of motion. Neck supple.  Cardiovascular: Normal rate, normal heart sounds and intact distal pulses.   Pulmonary/Chest: Effort normal and breath sounds normal. No respiratory distress. She exhibits no tenderness.  Abdominal: Soft. Bowel sounds are normal. There is no tenderness. There is no rebound and no guarding.  Musculoskeletal: Normal range of motion. She exhibits no edema, tenderness or deformity.  No visual wound, deformity, or palpated tenderness to head, neck, central spine, back, pelvis, lower extremities, and upper extremities.   Neurological: She is alert. No sensory deficit.  Alert and oriented to person. Not oriented to time or place at this time.   Cranial Nerves:  III,IV, VI: ptosis not present, extra-ocular movements intact bilaterally, direct and consensual pupillary light reflexes intact bilaterally V: facial sensation, jaw opening, and bite strength equal bilaterally VII: eyebrow raise, eyelid close, smile, frown, pucker equal bilaterally VIII: hearing grossly normal bilaterally  IX,X: palate elevation and swallowing intact XI: bilateral shoulder shrug and lateral head rotation equal and strong XII: midline tongue extension  Skin: Skin is warm. Capillary refill takes less than 2 seconds.  Psychiatric: She has a normal mood and affect. Her behavior is normal.  Nursing note and vitals reviewed.    ED Treatments / Results  Labs (all labs ordered are listed, but only abnormal results are displayed) Labs Reviewed  COMPREHENSIVE METABOLIC PANEL  CBC  URINALYSIS, ROUTINE W REFLEX MICROSCOPIC  I-STAT TROPOININ, ED    EKG  EKG Interpretation None       Radiology No results  found.  Procedures Procedures (including critical care time)  Medications Ordered in ED Medications - No data to display   Initial Impression / Assessment and Plan / ED Course  I have reviewed the triage vital signs and the nursing notes.  Pertinent labs & imaging results that were available during my care of the patient were  reviewed by me and considered in my medical decision making (see chart for details).  Clinical Course   Patient is an 82 year old female who presents by EMS from her nursing home for altered mental status today. I talked to the nursing home staff who stated that she was found sliding down her bed and was caught by someone who was passing by the room and lying down to the floor gently. Staff denied any injuries to patient. On exam patient is afebrile, vital signs stable, and in no apparent distress. Heart and lungs sounds clear. Abdomen soft/nontender. No visual wound, deformity, or palpated tenderness to head, neck, central spine, back, pelvis, lower extremities, and upper extremities.  At shift change care was transferred to [facility], PA-C who will follow pending studies, re-evaulate and determine disposition.    Final Clinical Impressions(s) / ED Diagnoses   Final diagnoses:  None    New Prescriptions New Prescriptions   No medications on file     [address] [place], [place] 08/12/16 2108    Shaune Pollack, MD 08/12/16 [phone]

## 2016-08-12 NOTE — ED Provider Notes (Signed)
  Pt signed out to me at shift change by previous provider pending lab analysis and reassessment. Please see previous provider's note for full H&P. Daughter in law at bedside reports she was told patient was found down, nursing staff told previous provider that when the staff helped the patient to the floor. Nursing staff noted that patient was not at her baseline, daughter-in-law bedside reports she is at her baseline. They also not several episodes of vomiting over the last several days. (EMS notes one episode of vomiting)   Patient denies any complaints upon my initial evaluation.  Labs returned showing elevated WBC at 18.1, AKI at 1.70 with a BUN of 46. Most recent labs here one year ago Cr. 1.09. Pt has documented CKD, uncertain where her baseline is.  No episodes of vomiting while here in the ED, likely viral gastroenteritis. Hospital service will be consulted for observation and hydration.   Vitals:   08/12/16 2356 08/13/16 0100  BP: (!) 123/50 133/66  Pulse: (!) 52 108  Resp: 25 26  Temp:       Eyvonne MechanicJeffrey Kayvon Mo, PA-C 08/13/16 0143    Shaune Pollackameron Isaacs, MD 08/15/16 [phone]

## 2016-08-12 NOTE — ED Triage Notes (Signed)
Per EMS: [facility], pt a/o x 2 at baseline, soiled. Pt denies any chest pain or SOB. Vitals stable upon arrival. Pt with 1 episode emesis enroute.

## 2016-08-12 NOTE — H&P (Addendum)
[name] [name] [id number] DOB: May 07, 1927 DOA: 08/12/2016     PCP: Eartha InchBADGER,MICHAEL C, MD   Outpatient Specialists: [facility]  [facility]: [facility]  Chief Complaint: confusion  HPI: [name] Goldengnes G [name] [name] is a 82 y.o. female with medical history significant of falls, COPD,CKD, CHF   dementia, hip fracture  Presented with questionable fall. Patient apparently was sleeping in bed did not sustain a a fall patient having some vomiting no diarrhea no hematemesis no fevers or chills.    she has unable to provide history history obtained per chart review. Daughter states patient Has some dementia and currently presents to be stable. Patient had one episode of nausea and vomiting en route. Denies any chest pain she is minimally verbal Regarding pertinent Chronic problems: hip fracture status post repair in 2016 she is currently wheelchair bound she has frequent falls CXR no evidence of pneumonia on chest x-ray hyperinflation  IN ER:  Temp (24hrs), Avg:98.9 F (37.2 C), Min:98.9 F (37.2 C), Max:98.9 F (37.2 C)  RR 26 98 % on 2L Hr 94 BP 114/54  Venous lactic acid  2.87  Troponin 0.05 NA 136 K 3.7 Cr 1.7 up from baseline of 1.09  WBC 18.1  Following Medications were ordered in ER: Medications  sodium chloride 0.9 % bolus 1,000 mL (1,000 mLs Intravenous New Bag/Given 08/12/16 2310)       Hospitalist was called for admission for Acute renal failure secondary to dehydration and evidence of SIRS   Review of Systems:    Pertinent positives include: confusion, nausea, vomiting,   Constitutional:  No weight loss, night sweats, Fevers, chills, fatigue, weight loss  HEENT:  No headaches, Difficulty swallowing,Tooth/dental problems,Sore throat,  No sneezing, itching, ear ache, nasal congestion, post nasal drip,  Cardio-vascular:  No chest pain, Orthopnea, PND, anasarca, dizziness, palpitations.no Bilateral lower extremity swelling  GI:  No  heartburn, indigestion, abdominal pain, diarrhea, change in bowel habits, loss of appetite, melena, blood in stool, hematemesis Resp:  no shortness of breath at rest. No dyspnea on exertion, No excess mucus, no productive cough, No non-productive cough, No coughing up of blood.No change in color of mucus.No wheezing. Skin:  no rash or lesions. No jaundice GU:  no dysuria, change in color of urine, no urgency or frequency. No straining to urinate.  No flank pain.  Musculoskeletal:  No joint pain or no joint swelling. No decreased range of motion. No back pain.  Psych:  No change in mood or affect. No depression or anxiety. No memory loss.  Neuro: no localizing neurological complaints, no tingling, no weakness, no double vision, no gait abnormality, no slurred speech, no   As per HPI otherwise 10 point review of systems negative.   Past Medical History: Past Medical History:  Diagnosis Date  . Anemia    a. Chronic iron deficiency anemia  . Anxiety   . CAD (coronary artery disease)    a. Cath 2004: 70% ostial LAD, 50-60% prox ramus disease, treated medically. b. Nuc 12/2009 - EF 74%, normal study.  . Chronic diastolic CHF (congestive heart failure) (HCC)    a. Echo 02/2014: mild focal basal hypertrophy of septum, Normal LV function EF 55-60%; grade 1 diastolic dysfunction; mildly elevated PASP.  Marland Kitchen. CKD (chronic kidney disease), stage III   . COPD (chronic obstructive pulmonary disease) (HCC)   . Coronary artery disease   . Depression   . Dizziness   . Fatigue   . GERD (gastroesophageal reflux  disease)   . Hyperlipidemia   . Hypertension   . Memory disorder   . Seizure disorder (HCC)   . Syncope and collapse    RECURRENT, RELATED TO HYPONATREMIA, NARCOTICS, AND BRADYCARDIA  . Tobacco abuse    PAST   Past Surgical History:  Procedure Laterality Date  . ABDOMINAL HYSTERECTOMY     early 1970's for fibroid  . CARDIAC CATHETERIZATION  2004  . HIP ARTHROPLASTY Right 12/05/2014    Procedure: ARTHROPLASTY BIPOLAR HIP;  Surgeon: Jodi GeraldsJohn Graves, MD;  Location: [facility] OR;  Service: Orthopedics;  Laterality: Right;  . ORIF PERIPROSTHETIC FRACTURE Right 01/18/2015   Procedure: OPEN REDUCTION INTERNAL FIXATION (ORIF) PERIPROSTHETIC FRACTURE;  Surgeon: Sheral Apleyimothy D Murphy, MD;  Location: [facility] OR;  Service: Orthopedics;  Laterality: Right;     Social History:  Ambulatory   wheelchair bound,     reports that she quit smoking about 16 years ago. She has a 40.00 pack-year smoking history. She has never used smokeless tobacco. She reports that she does not drink alcohol or use drugs.  Allergies:   Allergies  Allergen Reactions  . Codeine Nausea And Vomiting       Family History:   Family History  Problem Relation Age of Onset  . Heart attack Father   . CAD Father   . Heart failure Mother   . Congestive Heart Failure Mother   . Other Brother     cerebral hemmorhage  . Coronary artery disease Sister     Medications: Prior to Admission medications   Medication Sig Start Date End Date Taking? Authorizing Provider  acetaminophen (TYLENOL) 500 MG tablet Take 1,000 mg by mouth every 6 (six) hours as needed. For pain.   Yes Historical Provider, MD  albuterol (PROVENTIL HFA;VENTOLIN HFA) 108 (90 BASE) MCG/ACT inhaler Inhale 2 puffs into the lungs every 4 (four) hours as needed for wheezing or shortness of breath. 05/28/15  Yes Azalia BilisKevin Campos, MD  aspirin EC 325 MG EC tablet Take 1 tablet (325 mg total) by mouth daily with breakfast. 01/21/15  Yes Calvert CantorSaima Rizwan, MD  atorvastatin (LIPITOR) 10 MG tablet Take 10 mg by mouth daily. 01/16/15  Yes Historical Provider, MD  Calcium Carbonate-Vitamin D (CALCIUM 600 + D PO) Take 1 tablet by mouth daily.    Yes Historical Provider, MD  Cholecalciferol (VITAMIN D PO) Take 1,000 Units by mouth daily.    Yes Historical Provider, MD  clopidogrel (PLAVIX) 75 MG tablet Take 75 mg by mouth daily.   Yes Historical Provider, MD  docusate sodium (COLACE) 100 MG  capsule Take 1 capsule (100 mg total) by mouth 2 (two) times daily. 01/21/15  Yes Calvert CantorSaima Rizwan, MD  escitalopram (LEXAPRO) 5 MG tablet Take 5 mg by mouth daily.   Yes Historical Provider, MD  ferrous sulfate 325 (65 FE) MG tablet Take 1 tablet (325 mg total) by mouth 3 (three) times daily with meals. 01/21/15  Yes Calvert CantorSaima Rizwan, MD  furosemide (LASIX) 20 MG tablet Take 1 tablet (20 mg total) by mouth daily. 01/21/15  Yes Calvert CantorSaima Rizwan, MD  HYDROcodone-acetaminophen (NORCO) 5-325 MG per tablet Take 1-2 tablets by mouth every 6 (six) hours as needed for moderate pain. 01/22/15  Yes Brittney Tresa EndoKelly, PA-C  isosorbide mononitrate (IMDUR) 30 MG 24 hr tablet Take 30 mg by mouth daily.   Yes Historical Provider, MD  levETIRAcetam (KEPPRA) 750 MG tablet Take 1 tablet (750 mg total) by mouth every 12 (twelve) hours. 01/22/15  Yes Calvert CantorSaima Rizwan, MD  nitroGLYCERIN (NITROSTAT)  0.4 MG SL tablet Place 0.4 mg under the tongue every 5 (five) minutes as needed for chest pain.   Yes Historical Provider, MD  omeprazole (PRILOSEC) 20 MG capsule Take 20 mg by mouth daily.   Yes Historical Provider, MD  ondansetron (ZOFRAN ODT) 4 MG disintegrating tablet Take 1 tablet (4 mg total) by mouth every 8 (eight) hours as needed for nausea or vomiting. 08/21/14  Yes [name], DO  pregabalin (LYRICA) 75 MG capsule Take 75 mg by mouth at bedtime. 11/27/14  Yes Historical Provider, MD  tiotropium (SPIRIVA) 18 MCG inhalation capsule Place 18 mcg into inhaler and inhale daily. 11/26/14  Yes Historical Provider, MD  Vitamin D, Ergocalciferol, (DRISDOL) 50000 units CAPS capsule Take 50,000 Units by mouth every 7 (seven) days. Take on Sundays   Yes Historical Provider, MD  benzonatate (TESSALON) 100 MG capsule Take 1 capsule (100 mg total) by mouth every 8 (eight) hours. Patient not taking: Reported on 08/12/2016 05/28/15   Azalia Bilis, MD  feeding supplement, ENSURE ENLIVE, (ENSURE ENLIVE) LIQD Take 237 mLs by mouth 2 (two) times daily between meals.  01/21/15   Calvert Cantor, MD    Physical Exam: Patient Vitals for the past 24 hrs:  BP Temp Temp src Pulse Resp SpO2  08/12/16 2330 (!) 114/54 - - 94 26 98 %  08/12/16 2145 136/64 - - 113 17 97 %  08/12/16 2135 127/78 - - 108 17 98 %  08/12/16 1909 131/56 98.9 F (37.2 C) Rectal 112 18 100 %    1. General:  in No Acute distress 2. Psychological: Alert but not Oriented 3. Head/ENT:    Dry Mucous Membranes                          Head Non traumatic, neck supple                            Poor Dentition 4. SKIN: decreased Skin turgor,  Skin clean Dry and intact no rash 5. Heart: Regular rate and rhythm  loud  Murmur noted, Rub or gallop 6. Lungs:  Clear to auscultation bilaterally, no wheezes or crackles   7. Abdomen: Soft,  non-tender, Non distended 8. Lower extremities: no clubbing, cyanosis, or edema 9. Neurologically Grossly intact, moving all 4 extremities equally 10. MSK: Normal range of motion   body mass index is unknown because there is no height or weight on file.  Labs on Admission:   Labs on Admission: I have personally reviewed following labs and imaging studies  CBC:  Recent Labs Lab 08/12/16 1942  WBC 18.1*  HGB 13.9  HCT 40.6  MCV 92.9  PLT 375   Basic Metabolic Panel:  Recent Labs Lab 08/12/16 1942  NA 136  K 3.7  CL 96*  CO2 27  GLUCOSE 172*  BUN 46*  CREATININE 1.70*  CALCIUM 9.7   GFR: CrCl cannot be calculated (Unknown ideal weight.). Liver Function Tests:  Recent Labs Lab 08/12/16 1942  AST 23  ALT 13*  ALKPHOS 46  BILITOT 0.5  PROT 6.6  ALBUMIN 3.7   No results for input(s): LIPASE, AMYLASE in the last 168 hours. No results for input(s): AMMONIA in the last 168 hours. Coagulation Profile: No results for input(s): INR, PROTIME in the last 168 hours. Cardiac Enzymes: No results for input(s): CKTOTAL, CKMB, CKMBINDEX, TROPONINI in the last 168 hours. BNP (last 3 results)  No results for input(s): PROBNP in the last 8760  hours. HbA1C: No results for input(s): HGBA1C in the last 72 hours. CBG: No results for input(s): GLUCAP in the last 168 hours. Lipid Profile: No results for input(s): CHOL, HDL, LDLCALC, TRIG, CHOLHDL, LDLDIRECT in the last 72 hours. Thyroid Function Tests: No results for input(s): TSH, T4TOTAL, FREET4, T3FREE, THYROIDAB in the last 72 hours. Anemia Panel: No results for input(s): VITAMINB12, FOLATE, FERRITIN, TIBC, IRON, RETICCTPCT in the last 72 hours. Urine analysis:    Component Value Date/Time   COLORURINE YELLOW 08/12/2016 2230   APPEARANCEUR CLEAR 08/12/2016 2230   LABSPEC 1.018 08/12/2016 2230   PHURINE 5.0 08/12/2016 2230   GLUCOSEU NEGATIVE 08/12/2016 2230   HGBUR SMALL (A) 08/12/2016 2230   BILIRUBINUR NEGATIVE 08/12/2016 2230   BILIRUBINUR neg 01/16/2014 2050   KETONESUR NEGATIVE 08/12/2016 2230   PROTEINUR NEGATIVE 08/12/2016 2230   UROBILINOGEN 0.2 01/18/2015 1013   NITRITE NEGATIVE 08/12/2016 2230   LEUKOCYTESUR NEGATIVE 08/12/2016 2230   Sepsis Labs: @LABRCNTIP (procalcitonin:4,lacticidven:4) )No results found for this or any previous visit (from the past 240 hour(s)).     UA   no evidence of UTI    Lab Results  Component Value Date   HGBA1C 6.2 01/11/2012    CrCl cannot be calculated (Unknown ideal weight.).  BNP (last 3 results) No results for input(s): PROBNP in the last 8760 hours.   ECG REPORT  Independently reviewed Rate: 120  Rhythm: Sinus tachycardia ST&T Change: ST depressions in the lateral QTC 462  There were no vitals filed for this visit.   Cultures:    Component Value Date/Time   SDES URINE, CATHETERIZED 12/05/2014 0601   SPECREQUEST NONE 12/05/2014 0601   CULT NO GROWTH Performed at [facility]  12/05/2014 0601   REPTSTATUS 12/06/2014 FINAL 12/05/2014 0601     Radiological Exams on Admission: Dg Chest 2 View  Result Date: 08/12/2016 CLINICAL DATA:  Cough.  Altered mental status. EXAM: CHEST  2 VIEW  COMPARISON:  05/28/2015 FINDINGS: Mild linear left base opacities, probably atelectatic and this is accentuated by a shallow degree of inspiration. No confluent airspace consolidation. No effusion. Small hiatal hernia. Hilar, mediastinal and cardiac contours are unremarkable and unchanged. There is mild unchanged hyperinflation. Pulmonary vasculature is normal IMPRESSION: Shallow inspiration with accentuation of mild linear atelectatic changes in the left base. Small hiatal hernia. Hyperinflation. Electronically Signed   By: Ellery Plunk M.D.   On: 08/12/2016 21:38    Chart has been reviewed    Assessment/Plan   82 y.o. female with medical history significant of falls, COPD,CKD, CHF   dementia, hip fracture  and admitted for Acute renal failure secondary to dehydration and evidence of SIRS    Present on Admission: . CKD (chronic kidney disease) stage 3, GFR 30-59 ml/min acute on chronic renal shadow likely secondary to dehydration.obtain urine electrolytes . Coronary artery disease will order aspirin and Plavix continue home medications including Lipitor . Diastolic CHF, chronic (HCC) hold Lasix while dehydrated . Acute renal failure (HCC) most likely secondary to dehydration . Hypertension stable continue home medications . Dehydration or Lasix administered IV fluids . SIRS (systemic inflammatory response syndrome) (HCC) currently afebrile we'll monitor for any signs of infection obtained influenza panel History of seizure disorder continue Keppra  Other plan as per orders.  DVT prophylaxis:  scd    Code Status:  FULL CODE as per paper work  Family Communication:   Family not at  Bedside   Disposition  Plan:                              Back to current facility when stable                          Social Work     consulted                          Consults called: none    Admission status:   obs   Level of care    tele          I have spent a total of 56 min on this  admission   Jonetta Dagley 08/13/2016, 12:37 AM    [facility]  Pager [phone]   after 2 AM please page floor coverage PA If 7AM-7PM, please contact the day team taking care of the patient  [email or web address]  Password [email or web address]

## 2016-08-13 ENCOUNTER — Observation Stay (HOSPITAL_COMMUNITY): Payer: Medicare Other

## 2016-08-13 DIAGNOSIS — G309 Alzheimer's disease, unspecified: Secondary | ICD-10-CM | POA: Diagnosis present

## 2016-08-13 DIAGNOSIS — I130 Hypertensive heart and chronic kidney disease with heart failure and stage 1 through stage 4 chronic kidney disease, or unspecified chronic kidney disease: Secondary | ICD-10-CM | POA: Diagnosis present

## 2016-08-13 DIAGNOSIS — N183 Chronic kidney disease, stage 3 (moderate): Secondary | ICD-10-CM | POA: Diagnosis not present

## 2016-08-13 DIAGNOSIS — Z96641 Presence of right artificial hip joint: Secondary | ICD-10-CM | POA: Diagnosis present

## 2016-08-13 DIAGNOSIS — Z9181 History of falling: Secondary | ICD-10-CM | POA: Diagnosis not present

## 2016-08-13 DIAGNOSIS — J449 Chronic obstructive pulmonary disease, unspecified: Secondary | ICD-10-CM | POA: Diagnosis present

## 2016-08-13 DIAGNOSIS — F419 Anxiety disorder, unspecified: Secondary | ICD-10-CM | POA: Diagnosis present

## 2016-08-13 DIAGNOSIS — R0600 Dyspnea, unspecified: Secondary | ICD-10-CM | POA: Diagnosis not present

## 2016-08-13 DIAGNOSIS — N179 Acute kidney failure, unspecified: Secondary | ICD-10-CM | POA: Diagnosis present

## 2016-08-13 DIAGNOSIS — E785 Hyperlipidemia, unspecified: Secondary | ICD-10-CM | POA: Diagnosis present

## 2016-08-13 DIAGNOSIS — I5032 Chronic diastolic (congestive) heart failure: Secondary | ICD-10-CM | POA: Diagnosis present

## 2016-08-13 DIAGNOSIS — Z993 Dependence on wheelchair: Secondary | ICD-10-CM | POA: Diagnosis not present

## 2016-08-13 DIAGNOSIS — K219 Gastro-esophageal reflux disease without esophagitis: Secondary | ICD-10-CM | POA: Diagnosis present

## 2016-08-13 DIAGNOSIS — I4891 Unspecified atrial fibrillation: Secondary | ICD-10-CM | POA: Diagnosis present

## 2016-08-13 DIAGNOSIS — D509 Iron deficiency anemia, unspecified: Secondary | ICD-10-CM | POA: Diagnosis present

## 2016-08-13 DIAGNOSIS — R4182 Altered mental status, unspecified: Secondary | ICD-10-CM | POA: Diagnosis present

## 2016-08-13 DIAGNOSIS — I10 Essential (primary) hypertension: Secondary | ICD-10-CM | POA: Diagnosis not present

## 2016-08-13 DIAGNOSIS — I248 Other forms of acute ischemic heart disease: Secondary | ICD-10-CM | POA: Diagnosis present

## 2016-08-13 DIAGNOSIS — Z87891 Personal history of nicotine dependence: Secondary | ICD-10-CM | POA: Diagnosis not present

## 2016-08-13 DIAGNOSIS — Z90710 Acquired absence of both cervix and uterus: Secondary | ICD-10-CM | POA: Diagnosis not present

## 2016-08-13 DIAGNOSIS — E860 Dehydration: Secondary | ICD-10-CM | POA: Diagnosis present

## 2016-08-13 DIAGNOSIS — F329 Major depressive disorder, single episode, unspecified: Secondary | ICD-10-CM | POA: Diagnosis present

## 2016-08-13 DIAGNOSIS — F0280 Dementia in other diseases classified elsewhere without behavioral disturbance: Secondary | ICD-10-CM | POA: Diagnosis present

## 2016-08-13 DIAGNOSIS — R296 Repeated falls: Secondary | ICD-10-CM | POA: Diagnosis not present

## 2016-08-13 DIAGNOSIS — D72829 Elevated white blood cell count, unspecified: Secondary | ICD-10-CM | POA: Diagnosis present

## 2016-08-13 DIAGNOSIS — G40909 Epilepsy, unspecified, not intractable, without status epilepticus: Secondary | ICD-10-CM | POA: Diagnosis present

## 2016-08-13 DIAGNOSIS — R000 Tachycardia, unspecified: Secondary | ICD-10-CM | POA: Diagnosis present

## 2016-08-13 DIAGNOSIS — I2510 Atherosclerotic heart disease of native coronary artery without angina pectoris: Secondary | ICD-10-CM | POA: Diagnosis not present

## 2016-08-13 LAB — COMPREHENSIVE METABOLIC PANEL
ALBUMIN: 3.3 g/dL — AB (ref 3.5–5.0)
ALK PHOS: 41 U/L (ref 38–126)
ALT: 11 U/L — AB (ref 14–54)
ANION GAP: 10 (ref 5–15)
AST: 23 U/L (ref 15–41)
BUN: 41 mg/dL — AB (ref 6–20)
CALCIUM: 8.8 mg/dL — AB (ref 8.9–10.3)
CO2: 28 mmol/L (ref 22–32)
Chloride: 100 mmol/L — ABNORMAL LOW (ref 101–111)
Creatinine, Ser: 1.5 mg/dL — ABNORMAL HIGH (ref 0.44–1.00)
GFR calc Af Amer: 35 mL/min — ABNORMAL LOW (ref >60)
GFR calc non Af Amer: 30 mL/min — ABNORMAL LOW (ref >60)
GLUCOSE: 158 mg/dL — AB (ref 65–99)
Potassium: 3.6 mmol/L (ref 3.5–5.1)
SODIUM: 138 mmol/L (ref 135–145)
Total Bilirubin: 0.4 mg/dL (ref 0.3–1.2)
Total Protein: 6.1 g/dL — ABNORMAL LOW (ref 6.5–8.1)

## 2016-08-13 LAB — LACTIC ACID, PLASMA
LACTIC ACID, VENOUS: 2.4 mmol/L — AB (ref 0.5–1.9)
LACTIC ACID, VENOUS: 2.3 mmol/L — AB (ref 0.5–1.9)

## 2016-08-13 LAB — CBC
HCT: 36.9 % (ref 36.0–46.0)
HEMOGLOBIN: 12.8 g/dL (ref 12.0–15.0)
MCH: 32.4 pg (ref 26.0–34.0)
MCHC: 34.7 g/dL (ref 30.0–36.0)
MCV: 93.4 fL (ref 78.0–100.0)
Platelets: 322 10*3/uL (ref 150–400)
RBC: 3.95 MIL/uL (ref 3.87–5.11)
RDW: 12.4 % (ref 11.5–15.5)
WBC: 13.1 10*3/uL — ABNORMAL HIGH (ref 4.0–10.5)

## 2016-08-13 LAB — TROPONIN I
TROPONIN I: 0.05 ng/mL — AB (ref <0.03)
Troponin I: 0.04 ng/mL (ref <0.03)
Troponin I: 0.05 ng/mL (ref <0.03)

## 2016-08-13 LAB — INFLUENZA PANEL BY PCR (TYPE A & B)
Influenza A By PCR: NEGATIVE
Influenza B By PCR: NEGATIVE

## 2016-08-13 LAB — APTT: APTT: 27 s (ref 24–36)

## 2016-08-13 LAB — TSH: TSH: 0.88 u[IU]/mL (ref 0.350–4.500)

## 2016-08-13 LAB — MAGNESIUM: MAGNESIUM: 1.7 mg/dL (ref 1.7–2.4)

## 2016-08-13 LAB — MRSA PCR SCREENING: MRSA by PCR: NEGATIVE

## 2016-08-13 LAB — SODIUM, URINE, RANDOM: Sodium, Ur: <10 mmol/L

## 2016-08-13 LAB — CREATININE, URINE, RANDOM: Creatinine, Urine: 104.84 mg/dL

## 2016-08-13 LAB — PHOSPHORUS: Phosphorus: 3.7 mg/dL (ref 2.5–4.6)

## 2016-08-13 LAB — PROTIME-INR
INR: 1.08
PROTHROMBIN TIME: 14.1 s (ref 11.4–15.2)

## 2016-08-13 LAB — PROCALCITONIN: Procalcitonin: 0.2 ng/mL

## 2016-08-13 MED ORDER — DILTIAZEM LOAD VIA INFUSION
10.0000 mg | Freq: Once | INTRAVENOUS | Status: AC
  Administered 2016-08-13: 10 mg via INTRAVENOUS
  Filled 2016-08-13: qty 10

## 2016-08-13 MED ORDER — FERROUS SULFATE 325 (65 FE) MG PO TABS
325.0000 mg | ORAL_TABLET | Freq: Three times a day (TID) | ORAL | Status: DC
Start: 2016-08-13 — End: 2016-08-17
  Administered 2016-08-13 – 2016-08-17 (×12): 325 mg via ORAL
  Filled 2016-08-13 (×11): qty 1

## 2016-08-13 MED ORDER — LEVETIRACETAM 750 MG PO TABS
750.0000 mg | ORAL_TABLET | Freq: Two times a day (BID) | ORAL | Status: DC
  Administered 2016-08-13 – 2016-08-17 (×10): 750 mg via ORAL
  Filled 2016-08-13 (×2): qty 1
  Filled 2016-08-13: qty 3
  Filled 2016-08-13 (×9): qty 1

## 2016-08-13 MED ORDER — IPRATROPIUM BROMIDE 0.02 % IN SOLN: 0.5000 mg | Freq: Four times a day (QID) | RESPIRATORY_TRACT | Status: DC

## 2016-08-13 MED ORDER — ONDANSETRON HCL 4 MG PO TABS: 4.0000 mg | ORAL_TABLET | Freq: Four times a day (QID) | ORAL | Status: DC | PRN

## 2016-08-13 MED ORDER — DILTIAZEM HCL 100 MG IV SOLR
5.0000 mg/h | INTRAVENOUS | Status: DC
  Administered 2016-08-13 (×2): 5 mg/h via INTRAVENOUS
  Filled 2016-08-13 (×3): qty 100

## 2016-08-13 MED ORDER — PREGABALIN 75 MG PO CAPS
75.0000 mg | ORAL_CAPSULE | Freq: Every day | ORAL | Status: DC
  Administered 2016-08-13 – 2016-08-16 (×5): 75 mg via ORAL
  Filled 2016-08-13 (×5): qty 1

## 2016-08-13 MED ORDER — SODIUM CHLORIDE 0.9 % IV SOLN
INTRAVENOUS | Status: DC
  Administered 2016-08-13: 03:00:00 via INTRAVENOUS

## 2016-08-13 MED ORDER — ATORVASTATIN CALCIUM 10 MG PO TABS
10.0000 mg | ORAL_TABLET | Freq: Every day | ORAL | Status: DC
  Administered 2016-08-13 – 2016-08-17 (×5): 10 mg via ORAL
  Filled 2016-08-13 (×5): qty 1

## 2016-08-13 MED ORDER — SODIUM CHLORIDE 0.9% FLUSH
3.0000 mL | Freq: Two times a day (BID) | INTRAVENOUS | Status: DC
  Administered 2016-08-13 – 2016-08-16 (×9): 3 mL via INTRAVENOUS

## 2016-08-13 MED ORDER — ENSURE ENLIVE PO LIQD
237.0000 mL | Freq: Two times a day (BID) | ORAL | Status: DC
  Administered 2016-08-13 – 2016-08-17 (×7): 237 mL via ORAL

## 2016-08-13 MED ORDER — HYDROCODONE-ACETAMINOPHEN 5-325 MG PO TABS
1.0000 | ORAL_TABLET | Freq: Four times a day (QID) | ORAL | Status: DC | PRN
  Administered 2016-08-13 – 2016-08-16 (×5): 1 via ORAL
  Filled 2016-08-13 (×5): qty 1

## 2016-08-13 MED ORDER — PANTOPRAZOLE SODIUM 40 MG PO TBEC
40.0000 mg | DELAYED_RELEASE_TABLET | Freq: Every day | ORAL | Status: DC
  Administered 2016-08-13 – 2016-08-17 (×5): 40 mg via ORAL
  Filled 2016-08-13 (×5): qty 1

## 2016-08-13 MED ORDER — GUAIFENESIN ER 600 MG PO TB12
600.0000 mg | ORAL_TABLET | Freq: Two times a day (BID) | ORAL | Status: DC
  Administered 2016-08-13 – 2016-08-17 (×10): 600 mg via ORAL
  Filled 2016-08-13 (×10): qty 1

## 2016-08-13 MED ORDER — LEVALBUTEROL HCL 0.63 MG/3ML IN NEBU: 0.6300 mg | INHALATION_SOLUTION | RESPIRATORY_TRACT | Status: DC | PRN

## 2016-08-13 MED ORDER — ACETAMINOPHEN 650 MG RE SUPP: 650.0000 mg | Freq: Four times a day (QID) | RECTAL | Status: DC | PRN

## 2016-08-13 MED ORDER — CLOPIDOGREL BISULFATE 75 MG PO TABS
75.0000 mg | ORAL_TABLET | Freq: Every day | ORAL | Status: DC
  Administered 2016-08-13 – 2016-08-17 (×5): 75 mg via ORAL
  Filled 2016-08-13 (×5): qty 1

## 2016-08-13 MED ORDER — ASPIRIN EC 325 MG PO TBEC
325.0000 mg | DELAYED_RELEASE_TABLET | Freq: Every day | ORAL | Status: DC
  Administered 2016-08-13 – 2016-08-17 (×5): 325 mg via ORAL
  Filled 2016-08-13 (×5): qty 1

## 2016-08-13 MED ORDER — ACETAMINOPHEN 325 MG PO TABS: 650.0000 mg | ORAL_TABLET | Freq: Four times a day (QID) | ORAL | Status: DC | PRN

## 2016-08-13 MED ORDER — ESCITALOPRAM OXALATE 10 MG PO TABS
5.0000 mg | ORAL_TABLET | Freq: Every day | ORAL | Status: DC
  Administered 2016-08-13 – 2016-08-17 (×5): 5 mg via ORAL
  Filled 2016-08-13 (×5): qty 1

## 2016-08-13 MED ORDER — ORAL CARE MOUTH RINSE
15.0000 mL | Freq: Two times a day (BID) | OROMUCOSAL | Status: DC
  Administered 2016-08-13 – 2016-08-16 (×7): 15 mL via OROMUCOSAL

## 2016-08-13 MED ORDER — ISOSORBIDE MONONITRATE ER 30 MG PO TB24
30.0000 mg | ORAL_TABLET | Freq: Every day | ORAL | Status: DC
  Administered 2016-08-13 – 2016-08-16 (×4): 30 mg via ORAL
  Filled 2016-08-13 (×4): qty 1

## 2016-08-13 MED ORDER — ONDANSETRON HCL 4 MG/2ML IJ SOLN
4.0000 mg | Freq: Four times a day (QID) | INTRAMUSCULAR | Status: DC | PRN
  Administered 2016-08-13: 4 mg via INTRAVENOUS
  Filled 2016-08-13: qty 2

## 2016-08-13 NOTE — Progress Notes (Signed)
Pt admitted to floor and placed on telemetry, found to be in afib/aflutter RVR. MD notified. MD placed pt on cardizem drip and pt transferred to 3W for stepdown bed. Unable to complete pt admission due to altered mental status. Report given to RN on 3W. Pt was resting comfortably without complaining of pain, HR in 110-120s. Cardizem drip handed off to RN on 3W.

## 2016-08-13 NOTE — Progress Notes (Signed)
Pt converted to sinus tach. Checked vital signs, collected an EKG,and notified [facility] on call [name] [name](Lynch). Will continue Cardizem at 5 mg/hr for now.

## 2016-08-13 NOTE — Progress Notes (Signed)
Patient ID: [name] [name], female   DOB: [birth year], 82 y.o.   MRN: [id number]    PROGRESS NOTE    [name] [name]  [id number] DOB: 08/30/1926 DOA: 08/12/2016  PCP: [name], MD   Brief Narrative:  82 y.o. female with COPD,CKD, CHF, dementia, frequent falls per daughter, hip fracture in 2016, pt is wheel chair bound), presented for evaluation of ? Fall.   Assessment & Plan:   Present on Admission: Acute kidney injury imposed on CKD stage III, GFR 30-60's - suspect related to dehydration, pre renal etiology - started on IVF, Cr trending down from 1.7 --> 1.5 - BMP in AM  ? Fall - will ask for PT/OT eval   ? SIRS - with elevated lactic acid and tachycardia, leukocytosis - could be from reactive process, dehydration  - CXR with ? Attenuation at the left base, UA clear  - CT chest for clearer evaluation  - WBC up to 13 K, was 18K on admission  - no fevers, monitor vital signs   Elevated troponins - likely demand ischemia - no chest pain this AM   A-fib with RVR - rate in low 100's - pt not AC candidate due to frequent falls - currently on cardizem drip, plan to transition to PO in next 24 hours   Chronic diastolic CHF  - last ECHO in 2015 with stable EF and grade I diastolic CHF - will ask for repeat ECHO - hold lasix and careful with IVF  - daily weights, strict I/O  Essential HTN - SBP in 110's this AM - currently on Imdur, will continue same regimen   Coronary artery disease - continue aspirin and Plavix   Seizure - continue Keppra   DVT prophylaxis: SCD's Code Status: Full  Family Communication: Patient at bedside  Disposition Plan: To be determined   Consultants:   None  Procedures:   None  Antimicrobials:   None   Subjective: PT confused this AM  Objective: Vitals:   08/13/16 0154 08/13/16 0209 08/13/16 0416 08/13/16 0738  BP:  (!) 125/52 (!) 93/50 126/62  Pulse:  66 79 (!) 109  Resp:  16 (!) 25 (!) 25  Temp:  99.7 F (37.6  C) 98.1 F (36.7 C) 98.4 F (36.9 C)  TempSrc:   Oral Oral  SpO2:  93% 100% 99%  Weight: 54 kg (119 lb 0.8 oz)       Intake/Output Summary (Last 24 hours) at 08/13/16 1218 Last data filed at 08/13/16 9562  Gross per 24 hour  Intake          1515.25 ml  Output              100 ml  Net          1415.25 ml   Filed Weights   08/13/16 0154  Weight: 54 kg (119 lb 0.8 oz)   Examination:  General exam: Appears calm and comfortable but confused, able to follow most of the commands  Respiratory system: Respiratory effort normal. Diminished breath sounds at bases  Cardiovascular system: IRRR. No JVD, rubs, gallops or clicks. No pedal edema. Gastrointestinal system: Abdomen is nondistended, soft and nontender. No organomegaly or masses felt. Normal bowel sounds heard. Central nervous system: Alert and oriented to name and DOB, moving all 4 extremities   Data Reviewed: I have personally reviewed following labs and imaging studies  CBC:  Recent Labs Lab 08/12/16 1942 08/13/16 0237  WBC 18.1* 13.1*  HGB 13.9 12.8  HCT 40.6 36.9  MCV 92.9 93.4  PLT 375 322   Basic Metabolic Panel:  Recent Labs Lab 08/12/16 1942 08/13/16 0237  NA 136 138  K 3.7 3.6  CL 96* 100*  CO2 27 28  GLUCOSE 172* 158*  BUN 46* 41*  CREATININE 1.70* 1.50*  CALCIUM 9.7 8.8*  MG  --  1.7  PHOS  --  3.7   Liver Function Tests:  Recent Labs Lab 08/12/16 1942 08/13/16 0237  AST 23 23  ALT 13* 11*  ALKPHOS 46 41  BILITOT 0.5 0.4  PROT 6.6 6.1*  ALBUMIN 3.7 3.3*   Coagulation Profile:  Recent Labs Lab 08/13/16 0237  INR 1.08   Cardiac Enzymes:  Recent Labs Lab 08/13/16 0237 08/13/16 0705  TROPONINI 0.05* 0.04*   Thyroid Function Tests:  Recent Labs  08/13/16 0237  TSH 0.880   Urine analysis:    Component Value Date/Time   COLORURINE YELLOW 08/12/2016 2230   APPEARANCEUR CLEAR 08/12/2016 2230   LABSPEC 1.018 08/12/2016 2230   PHURINE 5.0 08/12/2016 2230   GLUCOSEU  NEGATIVE 08/12/2016 2230   HGBUR SMALL (A) 08/12/2016 2230   BILIRUBINUR NEGATIVE 08/12/2016 2230   BILIRUBINUR neg 01/16/2014 2050   KETONESUR NEGATIVE 08/12/2016 2230   PROTEINUR NEGATIVE 08/12/2016 2230   UROBILINOGEN 0.2 01/18/2015 1013   NITRITE NEGATIVE 08/12/2016 2230   LEUKOCYTESUR NEGATIVE 08/12/2016 2230    Recent Results (from the past 240 hour(s))  MRSA PCR Screening     Status: None   Collection Time: 08/13/16  1:56 AM  Result Value Ref Range Status   MRSA by PCR NEGATIVE NEGATIVE Final    Comment:        The GeneXpert MRSA Assay (FDA approved for NASAL specimens only), is one component of a comprehensive MRSA colonization surveillance program. It is not intended to diagnose MRSA infection nor to guide or monitor treatment for MRSA infections.       Radiology Studies: Dg Chest 2 View  Result Date: 08/12/2016 CLINICAL DATA:  Cough.  Altered mental status. EXAM: CHEST  2 VIEW COMPARISON:  05/28/2015 FINDINGS: Mild linear left base opacities, probably atelectatic and this is accentuated by a shallow degree of inspiration. No confluent airspace consolidation. No effusion. Small hiatal hernia. Hilar, mediastinal and cardiac contours are unremarkable and unchanged. There is mild unchanged hyperinflation. Pulmonary vasculature is normal IMPRESSION: Shallow inspiration with accentuation of mild linear atelectatic changes in the left base. Small hiatal hernia. Hyperinflation. Electronically Signed   By: Ellery Plunkaniel R Mitchell M.D.   On: 08/12/2016 21:38     Scheduled Meds: . aspirin  325 mg Oral Q breakfast  . atorvastatin  10 mg Oral Daily  . clopidogrel  75 mg Oral Daily  . escitalopram  5 mg Oral Daily  . feeding supplement (ENSURE ENLIVE)  237 mL Oral BID BM  . ferrous sulfate  325 mg Oral TID WC  . guaiFENesin  600 mg Oral BID  . isosorbide mononitrate  30 mg Oral Daily  . levETIRAcetam  750 mg Oral Q12H  . mouth rinse  15 mL Mouth Rinse BID  . pantoprazole  40 mg  Oral Daily  . pregabalin  75 mg Oral QHS  . sodium chloride flush  3 mL Intravenous Q12H   Continuous Infusions: . diltiazem (CARDIZEM) infusion 5 mg/hr (08/13/16 0412)     LOS: 0 days   Time spent: 20 minutes   Debbora PrestoMAGICK-Cordia Miklos, MD [facility] Pager [phone]  If 7PM-7AM, please contact night-coverage [email or web address]  Password [email or web address] 08/13/2016, 12:18 PM

## 2016-08-13 NOTE — Clinical Social Work Note (Signed)
Clinical Social Work Assessment  Patient Details  Name: [name] [name] MRN: [id number] Date of Birth: [birth year]  Date of referral:  08/13/16               Reason for consult:  Discharge Planning (admitted from [facility])                Permission sought to share information with:  Case Manager, Magazine features editoracility Contact Representative, Family Supports Permission granted to share information::  Yes, Verbal Permission Granted  Name::        Agency::  [facility]  Relationship::  Sister: [name] [name]  Contact Information:     Housing/Transportation Living arrangements for the past 2 months:  Skilled Building surveyorursing Facility Source of Information:  Medical Team, Sports coachCase Manager, Facility Patient Interpreter Needed:  None Criminal Activity/Legal Involvement Pertinent to Current Situation/Hospitalization:  No - Comment as needed Significant Relationships:  Other Family Members, Phel[employer] DodgeCommunity Support Lives with:  Facility Resident Do you feel safe going back to the place where you live?  Yes Need for family participation in patient care:  Yes (Comment) (AMS)  Care giving concerns:  No concerns noted at this time.  Patient currently altered cognitively which is not her baseline.  Call placed to patient sister: Yollette in regards to patient being admitted from [facility].  Patient is a LTC resident since 2016.  Plans appear to be at this time for her to return once medically stable.   Social Worker assessment / plan:  LCSW updated FL2 for patient as she is current facility resident. Plan will be to DC once medically stable. Will follow and assist with needs.  Employment status:  Retired Health and safety inspectornsurance information:  Armed forces operational officerMedicare, Medicaid [place] PT Recommendations:  Not assessed at this time Information / Referral to community resources:     Patient/Family's Response to care:  Family agreeable to plan  Patient/Family's Understanding of and Emotional Response to Diagnosis, Current Treatment, and Prognosis:  Patient has  poor understanding, not at baseline.  Emotional Assessment Appearance:  Appears stated age Attitude/Demeanor/Rapport:  Unable to Assess (patient is confused, unable to answer questions) Affect (typically observed):  Anxious, Apprehensive Orientation:  Oriented to Self Alcohol / Substance use:  Not Applicable Psych involvement (Current and /or in the community):  No (Comment)  Discharge Needs  Concerns to be addressed:  No discharge needs identified Readmission within the last 30 days:  No Current discharge risk:  None Barriers to Discharge:  Continued Medical Work up   Raye SorrowCoble, Orhan Mayorga N, Alexander MtLCSW 08/13/2016, 1:43 PM

## 2016-08-13 NOTE — Progress Notes (Addendum)
CRITICAL VALUE ALERT  Critical value received:  Troponin 0.05  Date of notification:  08/13/2016  Time of notification:  0356  Critical value read back: yes  Nurse who received alert:  Threasa AlphaS Twinkle Sockwell RN  MD notified (1st page):  hospitalists on call  Time of first page:  0409  MD notified (2nd page):   Time of second page:  Responding MD:  Burnadette PeterLynch   Time MD responded:  0421  No changes in care at this time

## 2016-08-13 NOTE — NC FL2 (Signed)
[place] MEDICAID FL2 LEVEL OF CARE SCREENING TOOL     IDENTIFICATION  Patient Name: [name] [name] Birthdate: Jun 29, 1927 Sex: female Admission Date (Current Location): 08/12/2016  [facility] and [place]  [place]   Facility and Address:  [facility], [address], [place], [place] [address]      Provider Number: [id number]  Attending Physician Name and Address:  Dorothea OgleIskra M Myers, MD  Relative Name and Phone Number:       Current Level of Care: Hospital Recommended Level of Care: Skilled Nursing Facility Prior Approval Number:    Date Approved/Denied:   PASRR Number:    Discharge Plan: SNF    Current Diagnoses: Patient Active Problem List   Diagnosis Date Noted  . Dehydration 08/12/2016  . SIRS (systemic inflammatory response syndrome) (HCC) 08/12/2016  . Malnutrition of moderate degree (HCC) 01/19/2015  . Femur fracture (HCC) 01/18/2015  . Recurrent falls 01/18/2015  . Acute renal failure (HCC) 01/18/2015  . Dehydration with hyponatremia 01/18/2015  . FTT (failure to thrive) in adult 01/18/2015  . Severe protein-calorie malnutrition (HCC) 01/18/2015  . Hypokalemia 01/18/2015  . Periprosthetic fracture around internal prosthetic joint 01/18/2015  . Closed femur fracture (HCC)   . Fracture of femoral neck, right (HCC) 12/05/2014  . CKD (chronic kidney disease) stage 3, GFR 30-59 ml/min 12/05/2014  . Diastolic CHF, chronic (HCC) 11/17/2011  . GERD (gastroesophageal reflux disease) 09/19/2011  . Orthostatic hypotension 05/11/2011  . Anxiety   . Seizure disorder (HCC)   . Hypertension   . Hyperlipidemia   . Coronary artery disease   . Anemia   . Tobacco abuse   . Memory disorder     Orientation RESPIRATION BLADDER Height & Weight     Self, Place  O2 (2L) Incontinent Weight: 119 lb 0.8 oz (54 kg) Height:     BEHAVIORAL SYMPTOMS/MOOD NEUROLOGICAL BOWEL NUTRITION STATUS      Incontinent Diet (regular/see DC summary)  AMBULATORY  STATUS COMMUNICATION OF NEEDS Skin   Limited Assist Verbally Normal                       Personal Care Assistance Level of Assistance  Bathing, Feeding, Dressing Bathing Assistance: Limited assistance Feeding assistance: Limited assistance Dressing Assistance: Limited assistance     Functional Limitations Info             SPECIAL CARE FACTORS FREQUENCY                       Contractures Contractures Info: Not present    Additional Factors Info  Code Status, Allergies Code Status Info: Full Code Allergies Info: Codeine           Current Medications (08/13/2016):  This is the current hospital active medication list Current Facility-Administered Medications  Medication Dose Route Frequency Provider Last Rate Last Dose  . acetaminophen (TYLENOL) tablet 650 mg  650 mg Oral Q6H PRN Therisa DoyneAnastassia Doutova, MD       Or  . acetaminophen (TYLENOL) suppository 650 mg  650 mg Rectal Q6H PRN Therisa DoyneAnastassia Doutova, MD      . aspirin EC tablet 325 mg  325 mg Oral Q breakfast Therisa DoyneAnastassia Doutova, MD   325 mg at 08/13/16 1305  . atorvastatin (LIPITOR) tablet 10 mg  10 mg Oral Daily Therisa DoyneAnastassia Doutova, MD      . clopidogrel (PLAVIX) tablet 75 mg  75 mg Oral Daily Therisa DoyneAnastassia Doutova, MD   75  mg at 08/13/16 1304  . diltiazem (CARDIZEM) 100 mg in dextrose 5 % 100 mL (1 mg/mL) infusion  5-15 mg/hr Intravenous Continuous Jinger NeighborsMary A Lynch, NP 5 mL/hr at 08/13/16 0412 5 mg/hr at 08/13/16 0412  . escitalopram (LEXAPRO) tablet 5 mg  5 mg Oral Daily Therisa DoyneAnastassia Doutova, MD   5 mg at 08/13/16 1303  . feeding supplement (ENSURE ENLIVE) (ENSURE ENLIVE) liquid 237 mL  237 mL Oral BID BM Anastassia Doutova, MD      . ferrous sulfate tablet 325 mg  325 mg Oral TID WC Therisa DoyneAnastassia Doutova, MD   325 mg at 08/13/16 1307  . guaiFENesin (MUCINEX) 12 hr tablet 600 mg  600 mg Oral BID Therisa DoyneAnastassia Doutova, MD   600 mg at 08/13/16 1302  . HYDROcodone-acetaminophen (NORCO/VICODIN) 5-325 MG per tablet 1-2 tablet  1-2  tablet Oral Q6H PRN Therisa DoyneAnastassia Doutova, MD      . isosorbide mononitrate (IMDUR) 24 hr tablet 30 mg  30 mg Oral Daily Therisa DoyneAnastassia Doutova, MD      . levalbuterol (XOPENEX) nebulizer solution 0.63 mg  0.63 mg Nebulization Q4H PRN Therisa DoyneAnastassia Doutova, MD      . levETIRAcetam (KEPPRA) tablet 750 mg  750 mg Oral Q12H Therisa DoyneAnastassia Doutova, MD   750 mg at 08/13/16 1304  . MEDLINE mouth rinse  15 mL Mouth Rinse BID Therisa DoyneAnastassia Doutova, MD      . ondansetron (ZOFRAN) tablet 4 mg  4 mg Oral Q6H PRN Therisa DoyneAnastassia Doutova, MD       Or  . ondansetron (ZOFRAN) injection 4 mg  4 mg Intravenous Q6H PRN Therisa DoyneAnastassia Doutova, MD   4 mg at 08/13/16 1015  . pantoprazole (PROTONIX) EC tablet 40 mg  40 mg Oral Daily Therisa DoyneAnastassia Doutova, MD   40 mg at 08/13/16 1304  . pregabalin (LYRICA) capsule 75 mg  75 mg Oral QHS Therisa DoyneAnastassia Doutova, MD   75 mg at 08/13/16 0236  . sodium chloride flush (NS) 0.9 % injection 3 mL  3 mL Intravenous Q12H Therisa DoyneAnastassia Doutova, MD   3 mL at 08/13/16 1015     Discharge Medications: Please see discharge summary for a list of discharge medications.  Relevant Imaging Results:  Relevant Lab Results:   Additional Information SSN: [id number]  Raye SorrowCoble, Zion Ta N, [place]CS[place]

## 2016-08-13 NOTE — Progress Notes (Addendum)
CRITICAL VALUE ALERT  Critical value received:  Lactic acid 2.4  Date of notification:  08/13/2016  Time of notification:  0350  Critical value read back: yes  Nurse who received alert:  Threasa AlphaS Laurana Magistro RN  MD notified (1st page):  Hospitalists on call  Time of first page:  0409  MD notified (2nd page):  Time of second page:  Responding MD:  Burnadette PeterLynch  Time MD responded:  0421  No changes in care at this time

## 2016-08-14 ENCOUNTER — Inpatient Hospital Stay (HOSPITAL_COMMUNITY): Payer: Medicare Other

## 2016-08-14 DIAGNOSIS — I2510 Atherosclerotic heart disease of native coronary artery without angina pectoris: Secondary | ICD-10-CM

## 2016-08-14 DIAGNOSIS — R0600 Dyspnea, unspecified: Secondary | ICD-10-CM

## 2016-08-14 LAB — CBC
HEMATOCRIT: 29.3 % — AB (ref 36.0–46.0)
HEMOGLOBIN: 9.9 g/dL — AB (ref 12.0–15.0)
MCH: 32.7 pg (ref 26.0–34.0)
MCHC: 33.8 g/dL (ref 30.0–36.0)
MCV: 96.7 fL (ref 78.0–100.0)
Platelets: 274 10*3/uL (ref 150–400)
RBC: 3.03 MIL/uL — AB (ref 3.87–5.11)
RDW: 12.8 % (ref 11.5–15.5)
WBC: 8.9 10*3/uL (ref 4.0–10.5)

## 2016-08-14 LAB — BASIC METABOLIC PANEL
ANION GAP: 6 (ref 5–15)
BUN: 27 mg/dL — ABNORMAL HIGH (ref 6–20)
CALCIUM: 8.8 mg/dL — AB (ref 8.9–10.3)
CO2: 29 mmol/L (ref 22–32)
Chloride: 102 mmol/L (ref 101–111)
Creatinine, Ser: 1.14 mg/dL — ABNORMAL HIGH (ref 0.44–1.00)
GFR calc Af Amer: 49 mL/min — ABNORMAL LOW (ref >60)
GFR, EST NON AFRICAN AMERICAN: 42 mL/min — AB (ref >60)
Glucose, Bld: 128 mg/dL — ABNORMAL HIGH (ref 65–99)
POTASSIUM: 4.4 mmol/L (ref 3.5–5.1)
Sodium: 137 mmol/L (ref 135–145)

## 2016-08-14 MED ORDER — DILTIAZEM HCL 30 MG PO TABS
30.0000 mg | ORAL_TABLET | Freq: Four times a day (QID) | ORAL | Status: DC
  Administered 2016-08-14 – 2016-08-15 (×5): 30 mg via ORAL
  Filled 2016-08-14 (×5): qty 1

## 2016-08-14 NOTE — Progress Notes (Signed)
Patient ID: [name] [name], female   DOB: 05-08-1927, 82 y.o.   MRN: [id number]    PROGRESS NOTE    [name] [name]  JYN:[id number] DOB: 05-08-1927 DOA: 08/12/2016  PCP: Eartha InchBADGER,MICHAEL C, MD   Brief Narrative:  82 y.o. female with COPD,CKD, CHF, dementia, frequent falls per daughter, hip fracture in 2016, pt is wheel chair bound), presented for evaluation of ? Fall.   Assessment & Plan:   Present on Admission: Acute kidney injury imposed on CKD stage III, GFR 30-60's - suspect related to dehydration, pre renal etiology - started on IVF, Cr trending down from 1.7 --> 1.5 --> 1.14 - will stop IVF as pt with underlying CHF and we want to avoid volume overload - encourage oral intake after SLP done as clinically determined  - BMP in AM  ? Fall - PT eval done, SNF recommended   ? SIRS - with elevated lactic acid and tachycardia, leukocytosis - could be from reactive process, dehydration  - CXR with ? Attenuation at the left base, UA clear  - CT chest for clearer evaluation, noted small bilateral pleural effusion but no definite PNA - WBC trending down and WNL this AM  - no fevers, monitor vital signs  - holding off on ABX as pt is clinically improving so far   Elevated troponins - likely demand ischemia - no chest pain this AM   A-fib with RVR - rate in 80's this AM, controlled  - pt not AC candidate due to frequent falls - transition to cardizem drip to PO Cardizem   Chronic diastolic CHF  - last ECHO in 2015 with stable EF and grade I diastolic CHF - will ask for repeat ECHO, still pending - stop IVF, may be able to resume lasix in am  - daily weights, strict I/O  Anemia of ? Chronic disease - drop in Hg since admission suspect to be dilutional as no clear evidence of active bleeding - will repeat CBC in AM - anemia panel in AM  Essential HTN - SBP in 100's this AM - currently on Imdur, will continue same regimen   Coronary artery disease - continue aspirin and  Plavix   Seizure - continue Keppra   DVT prophylaxis: SCD's Code Status: Full  Family Communication: Patient at bedside  Disposition Plan: To be determined, likely SNF In 2-3 days   Consultants:   None  Procedures:   None  Antimicrobials:   None  Subjective: PT more alert this AM, wants to eat.   Objective: Vitals:   08/14/16 0449 08/14/16 0525 08/14/16 0725 08/14/16 1114  BP: (!) 103/51 (!) 126/59 (!) 147/64 (!) 105/54  Pulse: 74 78 85 85  Resp: 18 20 17 18   Temp: 98.1 F (36.7 C)  98.4 F (36.9 C) 98.6 F (37 C)  TempSrc: Oral  Oral Oral  SpO2: 97% 99% 100% 98%  Weight: 54.7 kg (120 lb 8 oz)       Intake/Output Summary (Last 24 hours) at 08/14/16 1308 Last data filed at 08/14/16 1219  Gross per 24 hour  Intake              330 ml  Output             1150 ml  Net             -820 ml   Filed Weights   08/13/16 0154 08/14/16 0449  Weight: 54 kg (119 lb 0.8 oz) 54.7 kg (120 lb 8  oz)   Examination:  General exam: Appears calm and comfortable, able to follow most of the commands  Respiratory system: Respiratory effort normal. Diminished breath sounds at bases  Cardiovascular system: IRRR. No JVD, rubs, gallops or clicks. No pedal edema. Gastrointestinal system: Abdomen is nondistended, soft and nontender. No organomegaly or masses felt.  Central nervous system: Alert and oriented to name and DOB, moving all 4 extremities   Data Reviewed: I have personally reviewed following labs and imaging studies  CBC:  Recent Labs Lab 08/12/16 1942 08/13/16 0237 08/14/16 0335  WBC 18.1* 13.1* 8.9  HGB 13.9 12.8 9.9*  HCT 40.6 36.9 29.3*  MCV 92.9 93.4 96.7  PLT 375 322 274   Basic Metabolic Panel:  Recent Labs Lab 08/12/16 1942 08/13/16 0237 08/14/16 0335  NA 136 138 137  K 3.7 3.6 4.4  CL 96* 100* 102  CO2 27 28 29   GLUCOSE 172* 158* 128*  BUN 46* 41* 27*  CREATININE 1.70* 1.50* 1.14*  CALCIUM 9.7 8.8* 8.8*  MG  --  1.7  --   PHOS  --  3.7  --     Liver Function Tests:  Recent Labs Lab 08/12/16 1942 08/13/16 0237  AST 23 23  ALT 13* 11*  ALKPHOS 46 41  BILITOT 0.5 0.4  PROT 6.6 6.1*  ALBUMIN 3.7 3.3*   Coagulation Profile:  Recent Labs Lab 08/13/16 0237  INR 1.08   Cardiac Enzymes:  Recent Labs Lab 08/13/16 0237 08/13/16 0705 08/13/16 1257  TROPONINI 0.05* 0.04* 0.05*   Thyroid Function Tests:  Recent Labs  08/13/16 0237  TSH 0.880   Urine analysis:    Component Value Date/Time   COLORURINE YELLOW 08/12/2016 2230   APPEARANCEUR CLEAR 08/12/2016 2230   LABSPEC 1.018 08/12/2016 2230   PHURINE 5.0 08/12/2016 2230   GLUCOSEU NEGATIVE 08/12/2016 2230   HGBUR SMALL (A) 08/12/2016 2230   BILIRUBINUR NEGATIVE 08/12/2016 2230   BILIRUBINUR neg 01/16/2014 2050   KETONESUR NEGATIVE 08/12/2016 2230   PROTEINUR NEGATIVE 08/12/2016 2230   UROBILINOGEN 0.2 01/18/2015 1013   NITRITE NEGATIVE 08/12/2016 2230   LEUKOCYTESUR NEGATIVE 08/12/2016 2230    Recent Results (from the past 240 hour(s))  MRSA PCR Screening     Status: None   Collection Time: 08/13/16  1:56 AM  Result Value Ref Range Status   MRSA by PCR NEGATIVE NEGATIVE Final    Comment:        The GeneXpert MRSA Assay (FDA approved for NASAL specimens only), is one component of a comprehensive MRSA colonization surveillance program. It is not intended to diagnose MRSA infection nor to guide or monitor treatment for MRSA infections.   Culture, blood (x 2)     Status: None (Preliminary result)   Collection Time: 08/13/16  2:37 AM  Result Value Ref Range Status   Specimen Description BLOOD RIGHT ARM  Final   Special Requests BOTTLES DRAWN AEROBIC AND ANAEROBIC  Final   Culture NO GROWTH 1 DAY  Final   Report Status PENDING  Incomplete  Culture, blood (x 2)     Status: None (Preliminary result)   Collection Time: 08/13/16  2:45 AM  Result Value Ref Range Status   Specimen Description BLOOD RIGHT ARM  Final   Special Requests BOTTLES  DRAWN AEROBIC AND ANAEROBIC  Final   Culture NO GROWTH 1 DAY  Final   Report Status PENDING  Incomplete      Radiology Studies: Dg Chest 2 View  Result Date:  08/12/2016 CLINICAL DATA:  Cough.  Altered mental status. EXAM: CHEST  2 VIEW COMPARISON:  05/28/2015 FINDINGS: Mild linear left base opacities, probably atelectatic and this is accentuated by a shallow degree of inspiration. No confluent airspace consolidation. No effusion. Small hiatal hernia. Hilar, mediastinal and cardiac contours are unremarkable and unchanged. There is mild unchanged hyperinflation. Pulmonary vasculature is normal IMPRESSION: Shallow inspiration with accentuation of mild linear atelectatic changes in the left base. Small hiatal hernia. Hyperinflation. Electronically Signed   By: Ellery Plunkaniel R Mitchell M.D.   On: 08/12/2016 21:38   Ct Chest Wo Contrast  Result Date: 08/13/2016 CLINICAL DATA:  Dyspnea EXAM: CT CHEST WITHOUT CONTRAST TECHNIQUE: Multidetector CT imaging of the chest was performed following the standard protocol without IV contrast. COMPARISON:  08/12/2016 FINDINGS: Cardiovascular: Somewhat limited by the lack of IV contrast. Diffuse aortic calcifications are noted without aneurysmal dilatation or dissection. Cardiac structures appear within normal limits with the exception of diffuse coronary calcifications. Mediastinum/Nodes: Thoracic inlet is within normal limits. No hilar or mediastinal adenopathy is noted. A large hiatal hernia is noted as well as some retained ingested material within the distal esophagus. Lungs/Pleura: Small left pleural effusion is noted. Left lower lobe atelectasis is seen. A small right pleural effusion is noted with very mild right basilar atelectasis. No definitive parenchymal nodules are seen. Upper Abdomen: Within normal limits. Musculoskeletal: Degenerative changes of the thoracic spine. Changes of prior vertebral augmentation at T12 are seen. Chronic L2 and T6 compression  deformities are noted. No new bony abnormality is seen. IMPRESSION: Small bilateral pleural effusions left greater than right with associated lower lobe atelectatic changes. Chronic compression deformities. Large hiatal hernia as described. Electronically Signed   By: Alcide CleverMark  Lukens M.D.   On: 08/13/2016 16:15     Scheduled Meds: . aspirin  325 mg Oral Q breakfast  . atorvastatin  10 mg Oral Daily  . clopidogrel  75 mg Oral Daily  . escitalopram  5 mg Oral Daily  . feeding supplement (ENSURE ENLIVE)  237 mL Oral BID BM  . ferrous sulfate  325 mg Oral TID WC  . guaiFENesin  600 mg Oral BID  . isosorbide mononitrate  30 mg Oral Daily  . levETIRAcetam  750 mg Oral Q12H  . mouth rinse  15 mL Mouth Rinse BID  . pantoprazole  40 mg Oral Daily  . pregabalin  75 mg Oral QHS  . sodium chloride flush  3 mL Intravenous Q12H   Continuous Infusions: . diltiazem (CARDIZEM) infusion Stopped (08/14/16 0200)     LOS: 1 day   Time spent: 20 minutes   Debbora PrestoMAGICK-MYERS, ISKRA, MD [facility] Pager [phone]  If 7PM-7AM, please contact night-coverage [email or web address] Password [email or web address] 08/14/2016, 1:08 PM

## 2016-08-14 NOTE — Progress Notes (Signed)
Report received in patient's room via Greg RN using SBAR format, reviewed orders, labs, VS, tests and patient's general condition, assumed care of patient. 

## 2016-08-14 NOTE — Progress Notes (Signed)
Patient was bladder scanned since [facility] and unable to measure due to small amount. I got greater than 200 x 3 for verification and she was able [facility] for a total of 350cc amber clear urine with a foul smell to it. I also turned off her Cardizem drip after turning it down to 2.5 mg for HR in the 80's and B/P in the 100-110's eventually turned it off since HR dropped into the low 70's and B/P mid 80's to low 100's/50-60's, text paged MD, will continue to monitor. Patient was given 1 Norco earlier in the shift for c/o generalized soreness and achy feeling all over which was very effective and also helped bring her HR and B/P down, will continue to monitor and await any further orders from the MD.

## 2016-08-14 NOTE — Evaluation (Signed)
Physical Therapy Evaluation Patient Details Name: [name] [name] G Mcdonald MRN: [id number] DOB: 04-17-1927 Today's Date: 08/14/2016   History of Present Illness  82 y.o. female with medical history significant of falls, COPD,CKD, CHF, dementia, hip fracture  and admitted for Acute renal failure secondary to dehydration and evidence of SIRS   Clinical Impression  PTA family reports that pt using w/c for all mobility. She was able to perform w/c to chair transfers at supervision level per sister report. Based upon the patient's current mobility, anticipate that the pt will return to SNF following her acute stay. PT will continue to follow acutely.     Follow Up Recommendations SNF;Supervision/Assistance - 24 hour    Equipment Recommendations  None recommended by PT    Recommendations for Other Services       Precautions / Restrictions Precautions Precautions: Fall Restrictions Weight Bearing Restrictions: No      Mobility  Bed Mobility Overal bed mobility: Needs Assistance Bed Mobility: Sit to Supine       Sit to supine: Min assist   General bed mobility comments: min assist needed with LEs. Total assist to scoot up in bed.   Transfers Overall transfer level: Needs assistance Equipment used: 2 person hand held assist Transfers: Sit to/from UGI CorporationStand;Stand Pivot Transfers Sit to Stand: +2 physical assistance;Min assist Stand pivot transfers: +2 physical assistance;Min assist       General transfer comment: Pt following single step cues with delayed response. Able to pivot from chiar to bed with +2 assistance.   Ambulation/Gait                Stairs            Wheelchair Mobility    Modified Rankin (Stroke Patients Only)       Balance Overall balance assessment: Needs assistance Sitting-balance support: Single extremity supported Sitting balance-Leahy Scale: Poor     Standing balance support: Bilateral upper extremity supported Standing balance-Leahy Scale:  Poor Standing balance comment: assistance required for standing balance                             Pertinent Vitals/Pain Pain Assessment: Faces Faces Pain Scale: No hurt    Home Living Family/patient expects to be discharged to:: Skilled nursing facility                 Additional Comments: Pt previously at [facility] and plan is to return upon D/C (per sister report)    Prior Function Level of Independence: Needs assistance         Comments: PLF provided by sister as pt responding to questions. Sister reports that pt used w/c for all mobility. Pt was able to perform transfers with supervision.      Hand Dominance        Extremity/Trunk Assessment   Upper Extremity Assessment Upper Extremity Assessment: Generalized weakness    Lower Extremity Assessment Lower Extremity Assessment: Generalized weakness       Communication   Communication:  (pt minimally verbal during session)  Cognition Arousal/Alertness: Awake/alert Behavior During Therapy: Flat affect Overall Cognitive Status: History of cognitive impairments - at baseline                 General Comments: pt with history of dementia, not oriented to place or situation    General Comments General comments (skin integrity, edema, etc.): sister present throughout session    Exercises     Assessment/Plan  PT Assessment Patient needs continued PT services  PT Problem List Decreased strength;Decreased range of motion;Decreased activity tolerance;Decreased balance;Decreased mobility          PT Treatment Interventions DME instruction;Gait training;Functional mobility training;Therapeutic activities;Therapeutic exercise;Patient/family education    PT Goals (Current goals can be found in the Care Plan section)  Acute Rehab PT Goals Patient Stated Goal: get back to bed PT Goal Formulation: With patient Time For Goal Achievement: 08/28/16 Potential to Achieve Goals: Fair     Frequency Min 2X/week   Barriers to discharge        Co-evaluation               End of Session Equipment Utilized During Treatment: Gait belt;Oxygen Activity Tolerance: Patient tolerated treatment well Patient left: in bed;with call bell/phone within reach;with family/visitor present;with bed alarm set Nurse Communication: Mobility status         Time: [id number] PT Time Calculation (min) (ACUTE ONLY): 12 min   Charges:   PT Evaluation $PT Eval Moderate Complexity: 1 Procedure     PT G Codes:        Christiane HaBenjamin J. Shamar Engelmann, PT, CSCS Pager [phone] Office [phone]  08/14/2016, 10:46 AM

## 2016-08-15 LAB — ECHOCARDIOGRAM COMPLETE: Weight: 1928 oz

## 2016-08-15 LAB — BASIC METABOLIC PANEL
Anion gap: 4 — ABNORMAL LOW (ref 5–15)
BUN: 25 mg/dL — ABNORMAL HIGH (ref 6–20)
CALCIUM: 8.8 mg/dL — AB (ref 8.9–10.3)
CO2: 34 mmol/L — ABNORMAL HIGH (ref 22–32)
CREATININE: 0.98 mg/dL (ref 0.44–1.00)
Chloride: 100 mmol/L — ABNORMAL LOW (ref 101–111)
GFR, EST AFRICAN AMERICAN: 58 mL/min — AB (ref >60)
GFR, EST NON AFRICAN AMERICAN: 50 mL/min — AB (ref >60)
Glucose, Bld: 97 mg/dL (ref 65–99)
Potassium: 3.7 mmol/L (ref 3.5–5.1)
SODIUM: 138 mmol/L (ref 135–145)

## 2016-08-15 LAB — CBC
HCT: 27.4 % — ABNORMAL LOW (ref 36.0–46.0)
Hemoglobin: 9.3 g/dL — ABNORMAL LOW (ref 12.0–15.0)
MCH: 32.1 pg (ref 26.0–34.0)
MCHC: 33.9 g/dL (ref 30.0–36.0)
MCV: 94.5 fL (ref 78.0–100.0)
PLATELETS: 283 10*3/uL (ref 150–400)
RBC: 2.9 MIL/uL — ABNORMAL LOW (ref 3.87–5.11)
RDW: 12.4 % (ref 11.5–15.5)
WBC: 7.6 10*3/uL (ref 4.0–10.5)

## 2016-08-15 MED ORDER — RESOURCE THICKENUP CLEAR PO POWD
ORAL | Status: DC | PRN
Start: 2016-08-15 — End: 2016-08-17
  Filled 2016-08-15 (×2): qty 125

## 2016-08-15 MED ORDER — DILTIAZEM HCL 30 MG PO TABS
30.0000 mg | ORAL_TABLET | Freq: Three times a day (TID) | ORAL | Status: DC
  Administered 2016-08-16: 30 mg via ORAL
  Filled 2016-08-15: qty 1

## 2016-08-15 NOTE — Progress Notes (Signed)
Updated report received in patient's room via Tammy SoursGreg RN using SBAR format, new orders, VS, meds and events of the day reviewed, assumed care of patient.

## 2016-08-15 NOTE — Evaluation (Signed)
Clinical/Bedside Swallow Evaluation Patient Details  Name: [name] [name] MRN: [id number] Date of Birth: September 18, 1926  Today's Date: 08/15/2016 Time: SLP Start Time (ACUTE ONLY): 1215 SLP Stop Time (ACUTE ONLY): 1228 SLP Time Calculation (min) (ACUTE ONLY): 13 min  Past Medical History:  Past Medical History:  Diagnosis Date  . Anemia    a. Chronic iron deficiency anemia  . Anxiety   . CAD (coronary artery disease)    a. Cath 2004: 70% ostial LAD, 50-60% prox ramus disease, treated medically. b. Nuc 12/2009 - EF 74%, normal study.  . Chronic diastolic CHF (congestive heart failure) (HCC)    a. Echo 02/2014: mild focal basal hypertrophy of septum, Normal LV function EF 55-60%; grade 1 diastolic dysfunction; mildly elevated PASP.  Marland Kitchen. CKD (chronic kidney disease), stage III   . COPD (chronic obstructive pulmonary disease) (HCC)   . Coronary artery disease   . Depression   . Dizziness   . Fatigue   . GERD (gastroesophageal reflux disease)   . Hyperlipidemia   . Hypertension   . Memory disorder   . Seizure disorder (HCC)   . Syncope and collapse    RECURRENT, RELATED TO HYPONATREMIA, NARCOTICS, AND BRADYCARDIA  . Tobacco abuse    PAST   Past Surgical History:  Past Surgical History:  Procedure Laterality Date  . ABDOMINAL HYSTERECTOMY     early 1970's for fibroid  . CARDIAC CATHETERIZATION  2004  . HIP ARTHROPLASTY Right 12/05/2014   Procedure: ARTHROPLASTY BIPOLAR HIP;  Surgeon: Jodi GeraldsJohn Graves, MD;  Location: [facility] OR;  Service: Orthopedics;  Laterality: Right;  . ORIF PERIPROSTHETIC FRACTURE Right 01/18/2015   Procedure: OPEN REDUCTION INTERNAL FIXATION (ORIF) PERIPROSTHETIC FRACTURE;  Surgeon: Sheral Apleyimothy D Murphy, MD;  Location: [facility] OR;  Service: Orthopedics;  Laterality: Right;   HPI:  [name] [name] a 82 y.o.femalewith medical history significant of falls, COPD,CKD, CHF,dementia, hip fracture admitted with questionable fall out of bed (?)Patient apparently was sleeping in bed did not  sustain a a fallpatient having some vomiting no diarrhea no hematemesis no fevers or chills. Per chart nausea and vomiting en route and minimally verbal at baseline. CXR 08/13/16 noted a large hiatal hernia as well as some retained ingested material within the distal esophagus. Small bilateral pleural effusions left greater than right with   Assessment / Plan / Recommendation Clinical Impression  Congested, wet baseline cough increased with delay following 3-4 consecutive sips thin via cup. Manipulated solid turkey/gravy timely without oral residue. Observations and CXR revealing large hiatal hernia and retained food in esophagus leads to recommendation of objective assessment with MBS next date with diet modification of nectar thick liquids and Dys 3 texture. Will follow up with MBS next date.      Aspiration Risk  Mild aspiration risk    Diet Recommendation Dysphagia 3 (Mech soft);Nectar-thick liquid   Liquid Administration via: Cup;No straw Medication Administration: Whole meds with liquid Supervision: Patient able to self feed;Full supervision/cueing for compensatory strategies Compensations: Minimize environmental distractions;Small sips/bites;Slow rate Postural Changes: Seated upright at 90 degrees;Remain upright for at least 30 minutes after po intake    Other  Recommendations Oral Care Recommendations: Oral care BID Other Recommendations: Order thickener from pharmacy   Follow up Recommendations  (TBD)      Frequency and Duration            Prognosis        Swallow Study   General HPI: [name] [name] a 82 y.o.femalewith medical history significant of falls,  COPD,CKD, CHF,dementia, hip fracture admitted with questionable fall out of bed (?)Patient apparently was sleeping in bed did not sustain a a fallpatient having some vomiting no diarrhea no hematemesis no fevers or chills. Per chart nausea and vomiting en route and minimally verbal at baseline. CXR 08/13/16 noted a  large hiatal hernia as well as some retained ingested material within the distal esophagus. Small bilateral pleural effusions left greater than right with Type of Study: Bedside Swallow Evaluation Previous Swallow Assessment:  (none found) Diet Prior to this Study: Regular;Thin liquids Temperature Spikes Noted: Yes Respiratory Status: Nasal cannula History of Recent Intubation: No Behavior/Cognition: Alert;Cooperative;Requires cueing Oral Cavity Assessment: Within Functional Limits Oral Care Completed by SLP: No Oral Cavity - Dentition: Dentures, top;Dentures, bottom Vision: Functional for self-feeding Self-Feeding Abilities: Able to feed self;Needs assist;Needs set up Patient Positioning: Upright in bed Baseline Vocal Quality:  (no vocalizations) Volitional Cough: Cognitively unable to elicit Volitional Swallow: Unable to elicit    Oral/Motor/Sensory Function Overall Oral Motor/Sensory Function:  (no focal impairment)   Ice Chips Ice chips: Not tested   Thin Liquid Thin Liquid: Impaired Presentation: Cup Pharyngeal  Phase Impairments: Suspected delayed Swallow;Cough - Delayed    Nectar Thick Nectar Thick Liquid: Not tested   Honey Thick Honey Thick Liquid: Not tested   Puree Puree: Impaired Pharyngeal Phase Impairments: Suspected delayed Swallow   Solid   GO   Solid: Impaired Pharyngeal Phase Impairments: Suspected delayed Swallow        Royce MacadamiaLitaker, Media Pizzini Willis 08/15/2016,12:37 PM Breck CoonsLisa Willis BrownsvilleLitaker M.Ed [employer]-SLP Pager [phone]

## 2016-08-15 NOTE — Progress Notes (Signed)
Patient ID: [name] [name], female   DOB: [birth year], 82 y.o.   MRN: [id number]    PROGRESS NOTE    [name] Goldengnes G Knoke  NFA:[id number] DOB: [birth year] DOA: 08/12/2016  PCP: Eartha InchBADGER,MICHAEL C, MD   Brief Narrative:  82 y.o. female with COPD,CKD, CHF, dementia, frequent falls per daughter, hip fracture in 2016, pt is wheel chair bound), presented for evaluation of ? Fall.   Assessment & Plan:   Present on Admission: Acute kidney injury imposed on CKD stage III, GFR 30-60's - suspect related to dehydration, pre renal etiology - started on IVF, Cr trending down from 1.7 --> 1.5 --> 1.14 --> 0.98 - dys III diet recommended, encourage PO intake  - BMP in AM  ? Fall - PT eval done, SNF recommended, plan d/c in 1-2 days  ? SIRS - with elevated lactic acid and tachycardia, leukocytosis - could be from reactive process, dehydration  - CXR with ? Attenuation at the left base, UA clear  - CT chest for clearer evaluation, noted small bilateral pleural effusion but no definite PNA - WBC trending down and remains WNL - no fevers, monitor vital signs  - holding off on ABX as pt is clinically improving so far   Elevated troponins - likely demand ischemia - no chest pain this AM   A-fib with RVR - rate in 80's this AM, controlled  - pt not AC candidate due to frequent falls - transitioned to cardizem PO but SBP soft in 90's - will lower the frequency from QID to TID  Chronic diastolic CHF  - last ECHO in 2015 with stable EF and grade I diastolic CHF - will ask for repeat ECHO, still pending - weight 119 -120 lbs which is pt's baseline  - daily weights, strict I/O  Anemia of ? Chronic disease - drop in Hg since admission suspect to be dilutional as no clear evidence of active bleeding - will repeat CBC in AM  Essential HTN - SBP in 90's this AM - currently on Imdur, lower the frequency of Cardizem as noted above   Coronary artery disease - continue aspirin and Plavix   Seizure -  continue Keppra   DVT prophylaxis: SCD's Code Status: Full  Family Communication: Patient at bedside  Disposition Plan: To be determined, likely SNF In 1-2 days   Consultants:   None  Procedures:   None  Antimicrobials:   None  Subjective: PT more alert this AM, denies chest pain or dyspnea.   Objective: Vitals:   08/15/16 0408 08/15/16 0600 08/15/16 0847 08/15/16 1100  BP:  (!) 103/54 136/68 (!) 97/50  Pulse:   88 81  Resp:   (!) 25 19  Temp:   99.5 F (37.5 C) 98.8 F (37.1 C)  TempSrc:   Oral Axillary  SpO2:   98% 100%  Weight: 54.8 kg (120 lb 14.4 oz)       Intake/Output Summary (Last 24 hours) at 08/15/16 1539 Last data filed at 08/15/16 0900  Gross per 24 hour  Intake              360 ml  Output              400 ml  Net              -40 ml   Filed Weights   08/13/16 0154 08/14/16 0449 08/15/16 0408  Weight: 54 kg (119 lb 0.8 oz) 54.7 kg (120 lb 8 oz) 54.8 kg (120 lb  14.4 oz)   Examination:  General exam: Appears calm and comfortable, able to follow most of the commands  Respiratory system: Respiratory effort normal. Diminished breath sounds at bases  Cardiovascular system: IRRR. No JVD, rubs, gallops or clicks. No pedal edema. Gastrointestinal system: Abdomen is nondistended, soft and nontender. No organomegaly or masses felt.  Central nervous system: Alert and oriented to name and DOB, moving all 4 extremities   Data Reviewed: I have personally reviewed following labs and imaging studies  CBC:  Recent Labs Lab 08/12/16 1942 08/13/16 0237 08/14/16 0335 08/15/16 0311  WBC 18.1* 13.1* 8.9 7.6  HGB 13.9 12.8 9.9* 9.3*  HCT 40.6 36.9 29.3* 27.4*  MCV 92.9 93.4 96.7 94.5  PLT 375 322 274 283   Basic Metabolic Panel:  Recent Labs Lab 08/12/16 1942 08/13/16 0237 08/14/16 0335 08/15/16 0311  NA 136 138 137 138  K 3.7 3.6 4.4 3.7  CL 96* 100* 102 100*  CO2 27 28 29  34*  GLUCOSE 172* 158* 128* 97  BUN 46* 41* 27* 25*  CREATININE 1.70*  1.50* 1.14* 0.98  CALCIUM 9.7 8.8* 8.8* 8.8*  MG  --  1.7  --   --   PHOS  --  3.7  --   --    Liver Function Tests:  Recent Labs Lab 08/12/16 1942 08/13/16 0237  AST 23 23  ALT 13* 11*  ALKPHOS 46 41  BILITOT 0.5 0.4  PROT 6.6 6.1*  ALBUMIN 3.7 3.3*   Coagulation Profile:  Recent Labs Lab 08/13/16 0237  INR 1.08   Cardiac Enzymes:  Recent Labs Lab 08/13/16 0237 08/13/16 0705 08/13/16 1257  TROPONINI 0.05* 0.04* 0.05*   Thyroid Function Tests:  Recent Labs  08/13/16 0237  TSH 0.880   Urine analysis:    Component Value Date/Time   COLORURINE YELLOW 08/12/2016 2230   APPEARANCEUR CLEAR 08/12/2016 2230   LABSPEC 1.018 08/12/2016 2230   PHURINE 5.0 08/12/2016 2230   GLUCOSEU NEGATIVE 08/12/2016 2230   HGBUR SMALL (A) 08/12/2016 2230   BILIRUBINUR NEGATIVE 08/12/2016 2230   BILIRUBINUR neg 01/16/2014 2050   KETONESUR NEGATIVE 08/12/2016 2230   PROTEINUR NEGATIVE 08/12/2016 2230   UROBILINOGEN 0.2 01/18/2015 1013   NITRITE NEGATIVE 08/12/2016 2230   LEUKOCYTESUR NEGATIVE 08/12/2016 2230    Recent Results (from the past 240 hour(s))  MRSA PCR Screening     Status: None   Collection Time: 08/13/16  1:56 AM  Result Value Ref Range Status   MRSA by PCR NEGATIVE NEGATIVE Final    Comment:        The GeneXpert MRSA Assay (FDA approved for NASAL specimens only), is one component of a comprehensive MRSA colonization surveillance program. It is not intended to diagnose MRSA infection nor to guide or monitor treatment for MRSA infections.   Culture, blood (x 2)     Status: None (Preliminary result)   Collection Time: 08/13/16  2:37 AM  Result Value Ref Range Status   Specimen Description BLOOD RIGHT ARM  Final   Special Requests BOTTLES DRAWN AEROBIC AND ANAEROBIC  Final   Culture NO GROWTH 2 DAYS  Final   Report Status PENDING  Incomplete  Culture, blood (x 2)     Status: None (Preliminary result)   Collection Time: 08/13/16  2:45 AM  Result  Value Ref Range Status   Specimen Description BLOOD RIGHT ARM  Final   Special Requests BOTTLES DRAWN AEROBIC AND ANAEROBIC  Final   Culture NO GROWTH 2  DAYS  Final   Report Status PENDING  Incomplete      Radiology Studies: No results found.   Scheduled Meds: . aspirin  325 mg Oral Q breakfast  . atorvastatin  10 mg Oral Daily  . clopidogrel  75 mg Oral Daily  . diltiazem  30 mg Oral Q6H  . escitalopram  5 mg Oral Daily  . feeding supplement (ENSURE ENLIVE)  237 mL Oral BID BM  . ferrous sulfate  325 mg Oral TID WC  . guaiFENesin  600 mg Oral BID  . isosorbide mononitrate  30 mg Oral Daily  . levETIRAcetam  750 mg Oral Q12H  . mouth rinse  15 mL Mouth Rinse BID  . pantoprazole  40 mg Oral Daily  . pregabalin  75 mg Oral QHS  . sodium chloride flush  3 mL Intravenous Q12H   Continuous Infusions:    LOS: 2 days   Time spent: 20 minutes   Debbora Presto, MD [facility] Pager [phone]  If 7PM-7AM, please contact night-coverage [email or web address] Password [facility] 08/15/2016, 3:39 PM

## 2016-08-16 ENCOUNTER — Inpatient Hospital Stay (HOSPITAL_COMMUNITY): Payer: Medicare Other

## 2016-08-16 LAB — BASIC METABOLIC PANEL
Anion gap: 6 (ref 5–15)
BUN: 18 mg/dL (ref 6–20)
CALCIUM: 9.2 mg/dL (ref 8.9–10.3)
CO2: 32 mmol/L (ref 22–32)
Chloride: 100 mmol/L — ABNORMAL LOW (ref 101–111)
Creatinine, Ser: 0.94 mg/dL (ref 0.44–1.00)
GFR calc Af Amer: >60 mL/min (ref >60)
GFR, EST NON AFRICAN AMERICAN: 53 mL/min — AB (ref >60)
GLUCOSE: 114 mg/dL — AB (ref 65–99)
POTASSIUM: 3.8 mmol/L (ref 3.5–5.1)
Sodium: 138 mmol/L (ref 135–145)

## 2016-08-16 LAB — CBC
HCT: 27 % — ABNORMAL LOW (ref 36.0–46.0)
Hemoglobin: 8.9 g/dL — ABNORMAL LOW (ref 12.0–15.0)
MCH: 31.2 pg (ref 26.0–34.0)
MCHC: 33 g/dL (ref 30.0–36.0)
MCV: 94.7 fL (ref 78.0–100.0)
Platelets: 312 10*3/uL (ref 150–400)
RBC: 2.85 MIL/uL — ABNORMAL LOW (ref 3.87–5.11)
RDW: 12.3 % (ref 11.5–15.5)
WBC: 7.8 10*3/uL (ref 4.0–10.5)

## 2016-08-16 MED ORDER — HYDROCODONE-ACETAMINOPHEN 5-325 MG PO TABS: 1.0000 | ORAL_TABLET | Freq: Four times a day (QID) | ORAL | 0 refills | Status: AC | PRN

## 2016-08-16 MED ORDER — ISOSORBIDE MONONITRATE ER 30 MG PO TB24: 15.0000 mg | ORAL_TABLET | Freq: Every day | ORAL | Status: DC

## 2016-08-16 MED ORDER — DILTIAZEM HCL 30 MG PO TABS
30.0000 mg | ORAL_TABLET | Freq: Two times a day (BID) | ORAL | Status: DC
Start: 2016-08-16 — End: 2016-08-17
  Administered 2016-08-16 – 2016-08-17 (×2): 30 mg via ORAL
  Filled 2016-08-16 (×2): qty 1

## 2016-08-16 MED ORDER — DILTIAZEM HCL 30 MG PO TABS: 30.0000 mg | ORAL_TABLET | Freq: Two times a day (BID) | ORAL | 0 refills | Status: DC

## 2016-08-16 MED ORDER — ISOSORBIDE MONONITRATE ER 30 MG PO TB24
15.0000 mg | ORAL_TABLET | Freq: Every day | ORAL | Status: DC
  Administered 2016-08-17: 15 mg via ORAL
  Filled 2016-08-16: qty 1

## 2016-08-16 NOTE — Evaluation (Signed)
Occupational Therapy Evaluation Patient Details Name: [name] [name] MRN: [id number] DOB: 09/26/1926 Today's Date: 08/16/2016    History of Present Illness 82 y.o. female with medical history significant of falls, COPD,CKD, CHF, dementia, hip fracture  and admitted for Acute renal failure secondary to dehydration and evidence of SIRS    Clinical Impression   PT admitted with acute renal failure secondary to dehydration. Pt currently with functional limitiations due to the deficits listed below (see OT problem list). From home with multiple falls PTA.  Pt will benefit from skilled OT to increase their independence and safety with adls and balance to allow discharge SNF. Poor pO intake this session . RN aware of prefer liquids to food when offered     Follow Up Recommendations  SNF    Equipment Recommendations  3 in 1 bedside commode;Wheelchair cushion (measurements OT);Wheelchair (measurements OT);Hospital bed    Recommendations for Other Services       Precautions / Restrictions Precautions Precautions: Fall Precaution Comments: high fall risk       Mobility Bed Mobility Overal bed mobility: Needs Assistance Bed Mobility: Supine to Sit     Supine to sit: Min assist     General bed mobility comments: (A) to elevate trunk  Transfers Overall transfer level: Needs assistance Equipment used:  (stedy) Transfers: Sit to/from Stand Sit to Stand: Mod assist         General transfer comment: tp able to power up using stedy bar and maintain static upright posture    Balance Overall balance assessment: Needs assistance Sitting-balance support: Bilateral upper extremity supported;Feet supported Sitting balance-Leahy Scale: Fair     Standing balance support: Bilateral upper extremity supported;During functional activity Standing balance-Leahy Scale: Poor                              ADL Overall ADL's : Needs assistance/impaired Eating/Feeding: Set  up;Sitting Eating/Feeding Details (indicate cue type and reason): breakfast tray arriving with thin liquids pt sipping 2 or 3 times prior to awareness to error in deliverying. liquids removed and RN immediately called. Recommend pt remain static sitting upright for at least 30 minutes after meal Grooming: Wash/dry hands;Set up;Sitting                   Toilet Transfer: Moderate assistance;BSC (stedy)   Toileting- Clothing Manipulation and Hygiene: Set up;Sit to/from stand         General ADL Comments: pt upright in chair and requesting return to supine at tend of session . encouraged to remain upright     Vision     Perception     Praxis      Pertinent Vitals/Pain Pain Assessment: No/denies pain     Hand Dominance Right   Extremity/Trunk Assessment Upper Extremity Assessment Upper Extremity Assessment: Generalized weakness   Lower Extremity Assessment Lower Extremity Assessment: Generalized weakness;Defer to PT evaluation   Cervical / Trunk Assessment Cervical / Trunk Assessment: Kyphotic   Communication     Cognition Arousal/Alertness: Awake/alert Behavior During Therapy: Flat affect Overall Cognitive Status: History of cognitive impairments - at baseline                 General Comments: flat affect with minimal verbalizations but when stating "thank you " appropriatte and immediately following OT assistance.    General Comments       Exercises       Shoulder Instructions  Home Living Family/patient expects to be discharged to:: Skilled nursing facility                                        Prior Functioning/Environment Level of Independence: Needs assistance    ADL's / Homemaking Assistance Needed: no family to report (A) levels             OT Problem List: Decreased strength;Decreased activity tolerance;Impaired balance (sitting and/or standing);Decreased safety awareness;Decreased knowledge of use of DME or  AE;Decreased knowledge of precautions;Cardiopulmonary status limiting activity;Decreased cognition   OT Treatment/Interventions: Self-care/ADL training;Therapeutic exercise;DME and/or AE instruction;Therapeutic activities;Cognitive remediation/compensation;Patient/family education;Balance training    OT Goals(Current goals can be found in the care plan section) Acute Rehab OT Goals Patient Stated Goal: none stated OT Goal Formulation: With patient Time For Goal Achievement: 08/30/16 Potential to Achieve Goals: Good  OT Frequency: Min 2X/week   Barriers to D/C:            Co-evaluation              End of Session Equipment Utilized During Treatment: Gait belt;Other (comment) (steady) Nurse Communication: Mobility status;Precautions  Activity Tolerance: Patient tolerated treatment well Patient left: in chair;with call bell/phone within reach;with chair alarm set;with nursing/sitter in room   Time: 0850-0910 OT Time Calculation (min): 20 min Charges:  OT General Charges $OT Visit: 1 Procedure OT Evaluation $OT Eval Moderate Complexity: 1 Procedure G-Codes:    Boone MasterJones, Dystany Duffy B 08/16/2016, 11:27 AM  Mateo FlowJones, Brynn   OTR/L Pager: [phone]: [phone] Office: [phone] .

## 2016-08-16 NOTE — Discharge Summary (Signed)
Physician Discharge Summary  [name] [name] UJW:[id number] DOB: 11-29-1926 DOA: 08/12/2016  PCP: [name] Inch, MD  Admit date: 08/12/2016 Discharge date: 08/16/2016  Recommendations for Outpatient Follow-up:  1. Pt will need to follow up with PCP in 1-2 weeks post discharge 2. Please obtain BMP to evaluate electrolytes and kidney function 3. Please also check CBC to evaluate Hg and Hct levels 4. Imdur lowered to 15 mg PO QD (pt was on 30 mg) 5. Cardizem BID for now (30 mg tablet)  Discharge Diagnoses:  Active Problems:   Seizure disorder (HCC)   Hypertension   Coronary artery disease   Diastolic CHF, chronic (HCC)   CKD (chronic kidney disease) stage 3, GFR 30-59 ml/min   Recurrent falls   Acute renal failure (HCC)   Dehydration   SIRS (systemic inflammatory response syndrome) (HCC)  Discharge Condition: Stable  Diet recommendation: Heart healthy diet discussed in details   Brief Narrative:  82 y.o.femalewith COPD,CKD, CHF, dementia, frequent falls per daughter, hip fracture in 2016, pt is wheel chair bound), presented for evaluation of ? Fall.   Assessment & Plan:   Present on Admission: Acute kidney injury imposed on CKD stage III, GFR 30-60's - suspect related to dehydration, pre renal etiology - started on IVF, Cr trending down from 1.7 --> 1.5 --> 1.14 --> 0.98 - dys III diet recommended, pt tolerating well   ? Fall - PT eval done, SNF recommended, plan d/c in am if BP stable   ? SIRS, ruled out  - with elevated lactic acid and tachycardia, leukocytosis - could be from reactive process, dehydration  - CXR with ? Attenuation at the left base, UA clear  - CT chest for clearer evaluation, noted small bilateral pleural effusion but no definite PNA - WBC trending down and remains WNL - no fevers, monitor vital signs  - holding off on ABX as pt is clinically improving so far   Elevated troponins - likely demand ischemia - no chest pain this AM    A-fib with RVR - rate in 80's this AM, controlled  - pt not AC candidate due to frequent falls - transitioned to cardizem PO but SBP soft in 90's, Cardizem frequency lowered to BID   Chronic diastolic CHF  - last ECHO in 2015 with stable EF and grade I diastolic CHF - weight 119 -120 lbs which is pt's baseline   Anemia of ? Chronic disease - drop in Hg since admission suspect to be dilutional as no clear evidence of active bleeding - CBC in AM prior to discharge   Essential HTN - SBP in 90's this AM - currently on Imdur, lowered the dose from 30 to 15 mg PO QD  Coronary artery disease - continue aspirin and Plavix   Seizure - continue Keppra   DVT prophylaxis: SCD's Code Status: Full  Family Communication: Patient at bedside, called son and left message with my cell phone so he can reach me when able  Disposition Plan: SNF in AM if BP stable   Consultants:   None  Procedures:   None  Antimicrobials:   None  Procedures/Studies: Dg Chest 2 View  Result Date: 08/12/2016 CLINICAL DATA:  Cough.  Altered mental status. EXAM: CHEST  2 VIEW COMPARISON:  05/28/2015 FINDINGS: Mild linear left base opacities, probably atelectatic and this is accentuated by a shallow degree of inspiration. No confluent airspace consolidation. No effusion. Small hiatal hernia. Hilar, mediastinal and cardiac contours are unremarkable and unchanged. There is  mild unchanged hyperinflation. Pulmonary vasculature is normal IMPRESSION: Shallow inspiration with accentuation of mild linear atelectatic changes in the left base. Small hiatal hernia. Hyperinflation. Electronically Signed   By: Ellery Plunk M.D.   On: 08/12/2016 21:38   Ct Chest Wo Contrast  Result Date: 08/13/2016 CLINICAL DATA:  Dyspnea EXAM: CT CHEST WITHOUT CONTRAST TECHNIQUE: Multidetector CT imaging of the chest was performed following the standard protocol without IV contrast. COMPARISON:  08/12/2016 FINDINGS:  Cardiovascular: Somewhat limited by the lack of IV contrast. Diffuse aortic calcifications are noted without aneurysmal dilatation or dissection. Cardiac structures appear within normal limits with the exception of diffuse coronary calcifications. Mediastinum/Nodes: Thoracic inlet is within normal limits. No hilar or mediastinal adenopathy is noted. A large hiatal hernia is noted as well as some retained ingested material within the distal esophagus. Lungs/Pleura: Small left pleural effusion is noted. Left lower lobe atelectasis is seen. A small right pleural effusion is noted with very mild right basilar atelectasis. No definitive parenchymal nodules are seen. Upper Abdomen: Within normal limits. Musculoskeletal: Degenerative changes of the thoracic spine. Changes of prior vertebral augmentation at T12 are seen. Chronic L2 and T6 compression deformities are noted. No new bony abnormality is seen. IMPRESSION: Small bilateral pleural effusions left greater than right with associated lower lobe atelectatic changes. Chronic compression deformities. Large hiatal hernia as described. Electronically Signed   By: Alcide Clever M.D.   On: 08/13/2016 16:15    Discharge Exam: Vitals:   08/16/16 0727 08/16/16 0854  BP: (!) 93/47   Pulse:    Resp:    Temp:  98.7 F (37.1 C)   Vitals:   08/16/16 0452 08/16/16 0617 08/16/16 0727 08/16/16 0854  BP: (!) 101/48 (!) 101/48 (!) 93/47   Pulse: 72     Resp: (!) 23     Temp: 98.2 F (36.8 C)   98.7 F (37.1 C)  TempSrc: Axillary   Oral  SpO2: 100%  100%   Weight: 54.5 kg (120 lb 3.2 oz)       General: Pt is alert, follows commands appropriately, not in acute distress Cardiovascular: Regular rate and rhythm, S1/S2 +, no murmurs, no rubs, no gallops Respiratory: Clear to auscultation bilaterally, no wheezing, no crackles, no rhonchi Abdominal: Soft, non tender, non distended, bowel sounds +, no guarding Extremities: no edema, no cyanosis, pulses palpable  bilaterally DP and PT Neuro: Grossly nonfocal  Discharge Instructions   Allergies as of 08/16/2016      Reactions   Codeine Nausea And Vomiting      Medication List    STOP taking these medications   benzonatate 100 MG capsule Commonly known as:  TESSALON     TAKE these medications   acetaminophen 500 MG tablet Commonly known as:  TYLENOL Take 1,000 mg by mouth every 6 (six) hours as needed. For pain.   albuterol 108 (90 Base) MCG/ACT inhaler Commonly known as:  PROVENTIL HFA;VENTOLIN HFA Inhale 2 puffs into the lungs every 4 (four) hours as needed for wheezing or shortness of breath.   aspirin 325 MG EC tablet Take 1 tablet (325 mg total) by mouth daily with breakfast.   atorvastatin 10 MG tablet Commonly known as:  LIPITOR Take 10 mg by mouth daily.   CALCIUM 600 + D PO Take 1 tablet by mouth daily.   clopidogrel 75 MG tablet Commonly known as:  PLAVIX Take 75 mg by mouth daily.   diltiazem 30 MG tablet Commonly known as:  CARDIZEM Take 1  tablet (30 mg total) by mouth every 12 (twelve) hours.   docusate sodium 100 MG capsule Commonly known as:  COLACE Take 1 capsule (100 mg total) by mouth 2 (two) times daily.   escitalopram 5 MG tablet Commonly known as:  LEXAPRO Take 5 mg by mouth daily.   feeding supplement (ENSURE ENLIVE) Liqd Take 237 mLs by mouth 2 (two) times daily between meals.   ferrous sulfate 325 (65 FE) MG tablet Take 1 tablet (325 mg total) by mouth 3 (three) times daily with meals.   furosemide 20 MG tablet Commonly known as:  LASIX Take 1 tablet (20 mg total) by mouth daily.   HYDROcodone-acetaminophen 5-325 MG tablet Commonly known as:  NORCO Take 1-2 tablets by mouth every 6 (six) hours as needed for moderate pain.   isosorbide mononitrate 30 MG 24 hr tablet Commonly known as:  IMDUR Take 0.5 tablets (15 mg total) by mouth daily. What changed:  how much to take   levETIRAcetam 750 MG tablet Commonly known as:  KEPPRA Take 1  tablet (750 mg total) by mouth every 12 (twelve) hours.   nitroGLYCERIN 0.4 MG SL tablet Commonly known as:  NITROSTAT Place 0.4 mg under the tongue every 5 (five) minutes as needed for chest pain.   omeprazole 20 MG capsule Commonly known as:  PRILOSEC Take 20 mg by mouth daily.   ondansetron 4 MG disintegrating tablet Commonly known as:  ZOFRAN ODT Take 1 tablet (4 mg total) by mouth every 8 (eight) hours as needed for nausea or vomiting.   pregabalin 75 MG capsule Commonly known as:  LYRICA Take 75 mg by mouth at bedtime.   tiotropium 18 MCG inhalation capsule Commonly known as:  SPIRIVA Place 18 mcg into inhaler and inhale daily.   Vitamin D (Ergocalciferol) 50000 units Caps capsule Commonly known as:  DRISDOL Take 50,000 Units by mouth every 7 (seven) days. Take on Sundays   VITAMIN D PO Take 1,000 Units by mouth daily.       Follow-up Information    BADGER,MICHAEL C, MD Follow up.   Specialty:  Family Medicine Contact information: [address] [place] [address] [phone]            The results of significant diagnostics from this hospitalization (including imaging, microbiology, ancillary and laboratory) are listed below for reference.     Microbiology: Recent Results (from the past 240 hour(s))  MRSA PCR Screening     Status: None   Collection Time: 08/13/16  1:56 AM  Result Value Ref Range Status   MRSA by PCR NEGATIVE NEGATIVE Final    Comment:        The GeneXpert MRSA Assay (FDA approved for NASAL specimens only), is one component of a comprehensive MRSA colonization surveillance program. It is not intended to diagnose MRSA infection nor to guide or monitor treatment for MRSA infections.   Culture, blood (x 2)     Status: None (Preliminary result)   Collection Time: 08/13/16  2:37 AM  Result Value Ref Range Status   Specimen Description BLOOD RIGHT ARM  Final   Special Requests BOTTLES DRAWN AEROBIC AND ANAEROBIC 5ML  Final    Culture NO GROWTH 3 DAYS  Final   Report Status PENDING  Incomplete  Culture, blood (x 2)     Status: None (Preliminary result)   Collection Time: 08/13/16  2:45 AM  Result Value Ref Range Status   Specimen Description BLOOD RIGHT ARM  Final   Special  Requests BOTTLES DRAWN AEROBIC AND ANAEROBIC 5ML  Final   Culture NO GROWTH 3 DAYS  Final   Report Status PENDING  Incomplete     Labs: Basic Metabolic Panel:  Recent Labs Lab 08/12/16 1942 08/13/16 0237 08/14/16 0335 08/15/16 0311 08/16/16 0240  NA 136 138 137 138 138  K 3.7 3.6 4.4 3.7 3.8  CL 96* 100* 102 100* 100*  CO2 27 28 29  34* 32  GLUCOSE 172* 158* 128* 97 114*  BUN 46* 41* 27* 25* 18  CREATININE 1.70* 1.50* 1.14* 0.98 0.94  CALCIUM 9.7 8.8* 8.8* 8.8* 9.2  MG  --  1.7  --   --   --   PHOS  --  3.7  --   --   --    Liver Function Tests:  Recent Labs Lab 08/12/16 1942 08/13/16 0237  AST 23 23  ALT 13* 11*  ALKPHOS 46 41  BILITOT 0.5 0.4  PROT 6.6 6.1*  ALBUMIN 3.7 3.3*   No results for input(s): LIPASE, AMYLASE in the last 168 hours. No results for input(s): AMMONIA in the last 168 hours. CBC:  Recent Labs Lab 08/12/16 1942 08/13/16 0237 08/14/16 0335 08/15/16 0311 08/16/16 0240  WBC 18.1* 13.1* 8.9 7.6 7.8  HGB 13.9 12.8 9.9* 9.3* 8.9*  HCT 40.6 36.9 29.3* 27.4* 27.0*  MCV 92.9 93.4 96.7 94.5 94.7  PLT 375 322 274 283 312   Cardiac Enzymes:  Recent Labs Lab 08/13/16 0237 08/13/16 0705 08/13/16 1257  TROPONINI 0.05* 0.04* 0.05*   SIGNED: Time coordinating discharge: 30 minutes  Debbora PrestoMAGICK-Myrtis Maille, MD  [facility] 08/16/2016, 11:21 AM Pager [phone]  If 7PM-7AM, please contact night-coverage [email or web address] Password [email or web address]

## 2016-08-16 NOTE — Progress Notes (Signed)
Updated report received via Adelina MingsKelsey RN using SBAR format, reviewed new orders, VS, meds, labs and events of the day, assumed care of patient.

## 2016-08-16 NOTE — Progress Notes (Signed)
Modified Barium Swallow Progress Note  Patient Details  Name: [name] [name] MRN: [id number] Date of Birth: November 18, 1926  Today's Date: 08/16/2016  Modified Barium Swallow completed.  Full report located under Chart Review in the Imaging Section.  Brief recommendations include the following:  Clinical Impression  Pt has a mild oropharyngeal dysphagia with suspected primary esophageal issues. She has decreased bolus cohesion, delayed oral transit, and delayed swallow trigger with boluses pooling in her pharynx and requiring Min-Mod cues to initiate clearance. Boluses are primarily contained in the valleculae, but they do reach the pyriform sinuses with liquids and solids. She has silent penetration of thin liquids by straw as a result, but this clears with a cued throat clear. Mod residuals remain in the valleculae and pyriform sinuses with liquids only, suggestive of an esophageal etiology such as increased sub-UES pressure. During brief scan, pt appeared to have barium the remained in the esophagus and the barium tablet was never observed to clear. It did seem to travel upward within the distal esophagus, and the more proximal esophagus appeared to be opened at rest (MD not present to confirm findings). Recommend Dys 2 diet and thin liquids with no straws and meds crushed in puree. Will f/u for tolerance versus need for further modifications. MD may wish to consider esophageal assessment.   Swallow Evaluation Recommendations   Recommended Consults: Consider esophageal assessment   SLP Diet Recommendations: Dysphagia 2 (Fine chop) solids;Thin liquid   Liquid Administration via: Cup;No straw   Medication Administration: Crushed with puree   Supervision: Patient able to self feed;Full supervision/cueing for compensatory strategies   Compensations: Slow rate;Small sips/bites;Follow solids with liquid   Postural Changes: Remain semi-upright after after feeds/meals (Comment);Seated upright at 90  degrees   Oral Care Recommendations: Oral care BID        [name] [name], [name] [name] 08/16/2016,2:05 PM   [name] HamLaura Paiewonsky, M.A. CCC-SLP [phone]

## 2016-08-16 NOTE — Discharge Instructions (Signed)
Atrial Fibrillation °Introduction °Atrial fibrillation is a type of heartbeat that is irregular or fast (rapid). If you have this condition, your heart keeps quivering in a weird (chaotic) way. This condition can make it so your heart cannot pump blood normally. Having this condition gives a person more risk for stroke, heart failure, and other heart problems. There are different types of atrial fibrillation. Talk with your doctor to learn about the type that you have. °Follow these instructions at home: °· Take over-the-counter and prescription medicines only as told by your doctor. °· If your doctor prescribed a blood-thinning medicine, take it exactly as told. Taking too much of it can cause bleeding. If you do not take enough of it, you will not have the protection that you need against stroke and other problems. °· Do not use any tobacco products. These include cigarettes, chewing tobacco, and e-cigarettes. If you need help quitting, ask your doctor. °· If you have apnea (obstructive sleep apnea), manage it as told by your doctor. °· Do not drink alcohol. °· Do not drink beverages that have caffeine. These include coffee, soda, and tea. °· Maintain a healthy weight. Do not use diet pills unless your doctor says they are safe for you. Diet pills may make heart problems worse. °· Follow diet instructions as told by your doctor. °· Exercise regularly as told by your doctor. °· Keep all follow-up visits as told by your doctor. This is important. °Contact a doctor if: °· You notice a change in the speed, rhythm, or strength of your heartbeat. °· You are taking a blood-thinning medicine and you notice more bruising. °· You get tired more easily when you move or exercise. °Get help right away if: °· You have pain in your chest or your belly (abdomen). °· You have sweating or weakness. °· You feel sick to your stomach (nauseous). °· You notice blood in your throw up (vomit), poop (stool), or pee (urine). °· You are  short of breath. °· You suddenly have swollen feet and ankles. °· You feel dizzy. °· Your suddenly get weak or numb in your face, arms, or legs, especially if it happens on one side of your body. °· You have trouble talking, trouble understanding, or both. °· Your face or your eyelid droops on one side. °These symptoms may be an emergency. Do not wait to see if the symptoms will go away. Get medical help right away. Call your local emergency services (911 in the [place] Do not drive yourself to the hospital.  °This information is not intended to replace advice given to you by your health care provider. Make sure you discuss any questions you have with your health care provider. °Document Released: 05/17/2008 Document Revised: 01/14/2016 Document Reviewed: 12/03/2014 °© 2017 Elsevier ° °

## 2016-08-17 DIAGNOSIS — I10 Essential (primary) hypertension: Secondary | ICD-10-CM

## 2016-08-17 DIAGNOSIS — R296 Repeated falls: Secondary | ICD-10-CM

## 2016-08-17 DIAGNOSIS — N179 Acute kidney failure, unspecified: Principal | ICD-10-CM

## 2016-08-17 DIAGNOSIS — I2510 Atherosclerotic heart disease of native coronary artery without angina pectoris: Secondary | ICD-10-CM

## 2016-08-17 LAB — CBC
HEMATOCRIT: 27.1 % — AB (ref 36.0–46.0)
Hemoglobin: 8.8 g/dL — ABNORMAL LOW (ref 12.0–15.0)
MCH: 31 pg (ref 26.0–34.0)
MCHC: 32.5 g/dL (ref 30.0–36.0)
MCV: 95.4 fL (ref 78.0–100.0)
Platelets: 340 10*3/uL (ref 150–400)
RBC: 2.84 MIL/uL — ABNORMAL LOW (ref 3.87–5.11)
RDW: 12.4 % (ref 11.5–15.5)
WBC: 9.3 10*3/uL (ref 4.0–10.5)

## 2016-08-17 LAB — BASIC METABOLIC PANEL
Anion gap: 7 (ref 5–15)
BUN: 16 mg/dL (ref 6–20)
CHLORIDE: 99 mmol/L — AB (ref 101–111)
CO2: 34 mmol/L — AB (ref 22–32)
CREATININE: 0.9 mg/dL (ref 0.44–1.00)
Calcium: 9.1 mg/dL (ref 8.9–10.3)
GFR calc Af Amer: >60 mL/min (ref >60)
GFR calc non Af Amer: 56 mL/min — ABNORMAL LOW (ref >60)
GLUCOSE: 96 mg/dL (ref 65–99)
Potassium: 3.8 mmol/L (ref 3.5–5.1)
Sodium: 140 mmol/L (ref 135–145)

## 2016-08-17 NOTE — Progress Notes (Signed)
Speech Language Pathology Treatment: Dysphagia  Patient Details Name: [name] [name] MRN: [id number] DOB: [birth year] Today's Date: 08/17/2016 Time: [id number] SLP Time Calculation (min) (ACUTE ONLY): 10 min  Assessment / Plan / Recommendation Clinical Impression  Pt was not very interested in the food on her breakfast tray, but was agreeable to trying a few bites of puree and drinking water with Mod cues for small, single sips. Audible congestion was noted at baseline and did not appear to increase during intake. Vocal quality also remained clear, but delayed coughing was noted after SLP left the room and was documenting, despite being left in an upright position. Continue to suspect a primary esophageal issue. SLP will continue to follow as she remains in house. Would continue current diet with strict use of aspiration and esophageal precautions.    HPI HPI: [name] [name] a 82 y.o.femalewith medical history significant of falls, COPD,CKD, CHF,dementia, hip fracture admitted with questionable fall out of bed (?)Patient apparently was sleeping in bed did not sustain a a fallpatient having some vomiting no diarrhea no hematemesis no fevers or chills. Per chart nausea and vomiting en route and minimally verbal at baseline. CXR 08/13/16 noted a large hiatal hernia as well as some retained ingested material within the distal esophagus. Small bilateral pleural effusions left greater than right with      SLP Plan  Continue with current plan of care     Recommendations  Diet recommendations: Dysphagia 2 (fine chop);Thin liquid Liquids provided via: Cup;No straw Medication Administration: Crushed with puree Supervision: Patient able to self feed;Full supervision/cueing for compensatory strategies Compensations: Slow rate;Small sips/bites;Follow solids with liquid;Multiple dry swallows after each bite/sip Postural Changes and/or Swallow Maneuvers: Seated upright 90 degrees;Upright 30-60 min after  meal                Oral Care Recommendations: Oral care BID Follow up Recommendations: Skilled Nursing facility Plan: Continue with current plan of care       GO                [name] [name], [name] [name] 08/17/2016, 10:09 AM  [name] [name], M.A. CCC-SLP [phone]

## 2016-08-17 NOTE — Clinical Social Work Note (Signed)
Clinical Social Worker facilitated patient discharge including contacting patient family and facility to confirm patient discharge plans.  Clinical information faxed to facility and family agreeable with plan.  CS[facility]  RN to call report prior to discharge [phone] Room [phone]).  Clinical Social Worker will sign off for now as social work intervention is no longer needed. Please consult us again if new need arises.  Macario GoldsJesse Shakea [place]ip, [place] [phone]

## 2016-08-17 NOTE — Progress Notes (Signed)
[facility]  Spoke to Nurse Bjorn Loserhonda.

## 2016-08-17 NOTE — Care Management Note (Signed)
Case Management Note  Patient Details  Name: [name] [name] MRN: [id number] Date of Birth: 1926-09-15  Subjective/Objective:   Pt presented for CKD. Plan to d/c to [facility] 08-17-16. CSW is assisting with disposition needs.                  Action/Plan: No further needs from CM at this time.   Expected Discharge Date:                  Expected Discharge Plan:  Skilled Nursing Facility  In-House Referral:  Clinical Social Work  Discharge planning Services  CM Consult  Post Acute Care Choice:  NA Choice offered to:  NA  DME Arranged:  N/A DME Agency:  NA  HH Arranged:  NA HH Agency:  NA  Status of Service:  Completed, signed off  If discussed at Long Length of Stay Meetings, dates discussed:    Additional Comments:  Gala LewandowskyGraves-Bigelow, Mekaela Azizi Kaye, RN 08/17/2016, 11:42 AM

## 2016-08-17 NOTE — Discharge Summary (Signed)
Physician Discharge Summary  [name] [name] ZOX:[id number] DOB: 1927-04-30 DOA: 08/12/2016  PCP: [name] Inch, MD  Admit date: 08/12/2016 Discharge date: 08/17/2016  Recommendations for Outpatient Follow-up:  1. Pt will need to follow up with PCP in 1-2 weeks post discharge 2. Please obtain BMP to evaluate electrolytes and kidney function 3. Please also check CBC to evaluate Hg and Hct levels 4. Imdur lowered to 15 mg PO QD (pt was on 30 mg) 5. Cardizem BID for now (30 mg tablet) 6. To SNF-- would recommend palliative care to evaluate at SNF 7. Outpatient esophageal evaluation  Discharge Diagnoses:  Active Problems:   Seizure disorder (HCC)   Hypertension   Coronary artery disease   Diastolic CHF, chronic (HCC)   CKD (chronic kidney disease) stage 3, GFR 30-59 ml/min   Recurrent falls   Acute renal failure (HCC)   Dehydration   SIRS (systemic inflammatory response syndrome) (HCC)  Discharge Condition: Stable  Diet recommendation: DYS 2 diet with thin liquids with no straws and meds crushed in puree  Brief Narrative:  82 y.o.femalewith COPD,CKD, CHF, dementia, frequent falls per daughter, hip fracture in 2016, pt is wheel chair bound), presented for evaluation of ? Fall.   Assessment & Plan:   Present on Admission: Acute kidney injury imposed on CKD stage III, GFR 30-60's - suspect related to dehydration, pre renal etiology - started on IVF, Cr trending down from 1.7 --> 1.5 --> 1.14 --> 0.98 - dys 2 diet recommended, pt tolerating well   ? Fall - PT eval done, SNF recommended, plan d/c in am if BP stable   ? SIRS, ruled out  - with elevated lactic acid and tachycardia, leukocytosis - could be from reactive process, dehydration  - CXR with ? Attenuation at the left base, UA clear  - CT chest for clearer evaluation, noted small bilateral pleural effusion but no definite PNA - WBC trending down and remains WNL - no fevers, monitor vital signs  - holding  off on ABX as pt is clinically improving so far   Elevated troponins - likely demand ischemia - no chest pain this AM   A-fib with RVR - rate in 80's this AM, controlled  - pt not AC candidate due to frequent falls - transitioned to cardizem PO but SBP soft in 90's, Cardizem frequency lowered to BID   Chronic diastolic CHF  - last ECHO in 2015 with stable EF and grade I diastolic CHF - weight 119 -120 lbs which is pt's baseline   Anemia of ? Chronic disease - drop in Hg since admission suspect to be dilutional as no clear evidence of active bleeding   Essential HTN - SBP in 90's this AM - currently on Imdur, lowered the dose from 30 to 15 mg PO QD  Coronary artery disease - continue aspirin and Plavix   Seizure - continue Keppra     Consultants:   None  Procedures:   None  Antimicrobials:   None  Procedures/Studies: Dg Chest 2 View  Result Date: 08/12/2016 CLINICAL DATA:  Cough.  Altered mental status. EXAM: CHEST  2 VIEW COMPARISON:  05/28/2015 FINDINGS: Mild linear left base opacities, probably atelectatic and this is accentuated by a shallow degree of inspiration. No confluent airspace consolidation. No effusion. Small hiatal hernia. Hilar, mediastinal and cardiac contours are unremarkable and unchanged. There is mild unchanged hyperinflation. Pulmonary vasculature is normal IMPRESSION: Shallow inspiration with accentuation of mild linear atelectatic changes in the left base. Small  hiatal hernia. Hyperinflation. Electronically Signed   By: [name] [name] M.D.   On: 08/12/2016 21:38   Ct Chest Wo Contrast  Result Date: 08/13/2016 CLINICAL DATA:  Dyspnea EXAM: CT CHEST WITHOUT CONTRAST TECHNIQUE: Multidetector CT imaging of the chest was performed following the standard protocol without IV contrast. COMPARISON:  08/12/2016 FINDINGS: Cardiovascular: Somewhat limited by the lack of IV contrast. Diffuse aortic calcifications are noted without aneurysmal  dilatation or dissection. Cardiac structures appear within normal limits with the exception of diffuse coronary calcifications. Mediastinum/Nodes: Thoracic inlet is within normal limits. No hilar or mediastinal adenopathy is noted. A large hiatal hernia is noted as well as some retained ingested material within the distal esophagus. Lungs/Pleura: Small left pleural effusion is noted. Left lower lobe atelectasis is seen. A small right pleural effusion is noted with very mild right basilar atelectasis. No definitive parenchymal nodules are seen. Upper Abdomen: Within normal limits. Musculoskeletal: Degenerative changes of the thoracic spine. Changes of prior vertebral augmentation at T12 are seen. Chronic L2 and T6 compression deformities are noted. No new bony abnormality is seen. IMPRESSION: Small bilateral pleural effusions left greater than right with associated lower lobe atelectatic changes. Chronic compression deformities. Large hiatal hernia as described. Electronically Signed   By: [name] [name] M.D.   On: 08/13/2016 16:15   Dg Swallowing Func-speech Pathology  Result Date: 08/16/2016 Objective Swallowing Evaluation: Type of Study: MBS-Modified Barium Swallow Study Patient Details Name: [name] [name] MRN: [id number] Date of Birth: [birth year] Today's Date: 08/16/2016 Time: SLP Start Time (ACUTE ONLY): 1301-SLP Stop Time (ACUTE ONLY): 1320 SLP Time Calculation (min) (ACUTE ONLY): 19 min Past Medical History: Past Medical History: Diagnosis Date . Anemia   a. Chronic iron deficiency anemia . Anxiety  . CAD (coronary artery disease)   a. Cath 2004: 70% ostial LAD, 50-60% prox ramus disease, treated medically. b. Nuc 12/2009 - EF 74%, normal study. . Chronic diastolic CHF (congestive heart failure) (HCC)   a. Echo 02/2014: mild focal basal hypertrophy of septum, Normal LV function EF 55-60%; grade 1 diastolic dysfunction; mildly elevated PASP. Marland Kitchen CKD (chronic kidney disease), stage III  . COPD (chronic obstructive  pulmonary disease) (HCC)  . Coronary artery disease  . Depression  . Dizziness  . Fatigue  . GERD (gastroesophageal reflux disease)  . Hyperlipidemia  . Hypertension  . Memory disorder  . Seizure disorder (HCC)  . Syncope and collapse   RECURRENT, RELATED TO HYPONATREMIA, NARCOTICS, AND BRADYCARDIA . Tobacco abuse   PAST Past Surgical History: Past Surgical History: Procedure Laterality Date . ABDOMINAL HYSTERECTOMY    early 1970's for fibroid . CARDIAC CATHETERIZATION  2004 . HIP ARTHROPLASTY Right 12/05/2014  Procedure: ARTHROPLASTY BIPOLAR HIP;  Surgeon: Jodi Geralds, MD;  Location: [facility] OR;  Service: Orthopedics;  Laterality: Right; . ORIF PERIPROSTHETIC FRACTURE Right 01/18/2015  Procedure: OPEN REDUCTION INTERNAL FIXATION (ORIF) PERIPROSTHETIC FRACTURE;  Surgeon: Sheral Apley, MD;  Location: [facility] OR;  Service: Orthopedics;  Laterality: Right; HPI: DEANDRE STANSEL a 82 y.o.femalewith medical history significant of falls, COPD,CKD, CHF,dementia, hip fracture admitted with questionable fall out of bed (?)Patient apparently was sleeping in bed did not sustain a a fallpatient having some vomiting no diarrhea no hematemesis no fevers or chills. Per chart nausea and vomiting en route and minimally verbal at baseline. CXR 08/13/16 noted a large hiatal hernia as well as some retained ingested material within the distal esophagus. Small bilateral pleural effusions left greater than right with Subjective: pt alert, denies difficulty with swallowing  Assessment / Plan / Recommendation CHL IP CLINICAL IMPRESSIONS 08/16/2016 Therapy Diagnosis Mild oral phase dysphagia;Mild pharyngeal phase dysphagia;Suspected primary esophageal dysphagia  Clinical Impression Pt has a mild oropharyngeal dysphagia with suspected primary esophageal issues. She has decreased bolus cohesion, delayed oral transit, and delayed swallow trigger with boluses pooling in her pharynx and requiring Min-Mod cues to initiate clearance. Boluses are  primarily contained in the valleculae, but they do reach the pyriform sinuses with liquids and solids. She has silent penetration of thin liquids by straw as a result, but this clears with a cued throat clear. Mod residuals remain in the valleculae and pyriform sinuses with liquids only, suggestive of an esophageal etiology such as increased sub-UES pressure. During brief scan, pt appeared to have barium the remained in the esophagus and the barium tablet was never observed to clear. It did seem to travel upward within the distal esophagus, and the more proximal esophagus appeared to be opened at rest (MD not present to confirm findings). Recommend Dys 2 diet and thin liquids with no straws and meds crushed in puree. Will f/u for tolerance versus need for further modifications. MD may wish to consider esophageal assessment.  Impact on safety and function Mild aspiration risk   CHL IP TREATMENT RECOMMENDATION 08/16/2016 Treatment Recommendations Therapy as outlined in treatment plan below   Prognosis 08/16/2016 Prognosis for Safe Diet Advancement Fair Barriers to Reach Goals Other (Comment) Barriers/Prognosis Comment -- CHL IP DIET RECOMMENDATION 08/16/2016 SLP Diet Recommendations Dysphagia 2 (Fine chop) solids;Thin liquid Liquid Administration via Cup;No straw Medication Administration Crushed with puree Compensations Slow rate;Small sips/bites;Follow solids with liquid Postural Changes Remain semi-upright after after feeds/meals (Comment);Seated upright at 90 degrees   CHL IP OTHER RECOMMENDATIONS 08/16/2016 Recommended Consults Consider esophageal assessment Oral Care Recommendations Oral care BID Other Recommendations --   CHL IP FOLLOW UP RECOMMENDATIONS 08/16/2016 Follow up Recommendations (No Data)   CHL IP FREQUENCY AND DURATION 08/16/2016 Speech Therapy Frequency (ACUTE ONLY) min 2x/week Treatment Duration 2 weeks      CHL IP ORAL PHASE 08/16/2016 Oral Phase Impaired Oral - Pudding Teaspoon -- Oral -  Pudding Cup -- Oral - Honey Teaspoon -- Oral - Honey Cup -- Oral - Nectar Teaspoon -- Oral - Nectar Cup -- Oral - Nectar Straw -- Oral - Thin Teaspoon -- Oral - Thin Cup Decreased bolus cohesion Oral - Thin Straw Decreased bolus cohesion Oral - Puree Decreased bolus cohesion;Delayed oral transit Oral - Mech Soft Decreased bolus cohesion;Delayed oral transit Oral - Regular -- Oral - Multi-Consistency -- Oral - Pill WFL Oral Phase - Comment --  CHL IP PHARYNGEAL PHASE 08/16/2016 Pharyngeal Phase Impaired Pharyngeal- Pudding Teaspoon -- Pharyngeal -- Pharyngeal- Pudding Cup -- Pharyngeal -- Pharyngeal- Honey Teaspoon -- Pharyngeal -- Pharyngeal- Honey Cup -- Pharyngeal -- Pharyngeal- Nectar Teaspoon -- Pharyngeal -- Pharyngeal- Nectar Cup -- Pharyngeal -- Pharyngeal- Nectar Straw -- Pharyngeal -- Pharyngeal- Thin Teaspoon -- Pharyngeal -- Pharyngeal- Thin Cup Delayed swallow initiation-pyriform sinuses;Pharyngeal residue - valleculae;Pharyngeal residue - pyriform Pharyngeal -- Pharyngeal- Thin Straw Delayed swallow initiation-pyriform sinuses;Pharyngeal residue - valleculae;Pharyngeal residue - pyriform;Penetration/Aspiration before swallow Pharyngeal Material enters airway, remains ABOVE vocal cords and not ejected out Pharyngeal- Puree Delayed swallow initiation-vallecula Pharyngeal -- Pharyngeal- Mechanical Soft Delayed swallow initiation-pyriform sinuses Pharyngeal -- Pharyngeal- Regular -- Pharyngeal -- Pharyngeal- Multi-consistency -- Pharyngeal -- Pharyngeal- Pill WFL Pharyngeal -- Pharyngeal Comment --  No flowsheet data found. No flowsheet data found. Maxcine Hamaiewonsky, Laura 08/16/2016, 2:04 PM  Maxcine HamLaura Paiewonsky, M.A. CCC-SLP [phone]  Discharge Exam: Vitals:   08/17/16 0431 08/17/16 0730  BP: (!) 104/54 (!) 95/52  Pulse: 80 72  Resp: 15 (!) 22  Temp: 98 F (36.7 C) 99 F (37.2 C)   Vitals:   08/16/16 1900 08/17/16 0004 08/17/16 0431 08/17/16 0730  BP: (!) 114/54 (!) 85/55 (!) 104/54  (!) 95/52  Pulse: 90 80 80 72  Resp: (!) 22 16 15  (!) 22  Temp: 98.7 F (37.1 C) 98.7 F (37.1 C) 98 F (36.7 C) 99 F (37.2 C)  TempSrc: Oral Oral Axillary Axillary  SpO2: 100% 100% 100% 100%  Weight:   54.7 kg (120 lb 8 oz)     General: Pt is alert, follows commands appropriately, not in acute distress- hard of hearing   Discharge Instructions   Allergies as of 08/17/2016      Reactions   Codeine Nausea And Vomiting      Medication List    STOP taking these medications   benzonatate 100 MG capsule Commonly known as:  TESSALON     TAKE these medications   acetaminophen 500 MG tablet Commonly known as:  TYLENOL Take 1,000 mg by mouth every 6 (six) hours as needed. For pain.   albuterol 108 (90 Base) MCG/ACT inhaler Commonly known as:  PROVENTIL HFA;VENTOLIN HFA Inhale 2 puffs into the lungs every 4 (four) hours as needed for wheezing or shortness of breath.   aspirin 325 MG EC tablet Take 1 tablet (325 mg total) by mouth daily with breakfast.   atorvastatin 10 MG tablet Commonly known as:  LIPITOR Take 10 mg by mouth daily.   CALCIUM 600 + D PO Take 1 tablet by mouth daily.   clopidogrel 75 MG tablet Commonly known as:  PLAVIX Take 75 mg by mouth daily.   diltiazem 30 MG tablet Commonly known as:  CARDIZEM Take 1 tablet (30 mg total) by mouth every 12 (twelve) hours.   docusate sodium 100 MG capsule Commonly known as:  COLACE Take 1 capsule (100 mg total) by mouth 2 (two) times daily.   escitalopram 5 MG tablet Commonly known as:  LEXAPRO Take 5 mg by mouth daily.   feeding supplement (ENSURE ENLIVE) Liqd Take 237 mLs by mouth 2 (two) times daily between meals.   ferrous sulfate 325 (65 FE) MG tablet Take 1 tablet (325 mg total) by mouth 3 (three) times daily with meals.   furosemide 20 MG tablet Commonly known as:  LASIX Take 1 tablet (20 mg total) by mouth daily.   HYDROcodone-acetaminophen 5-325 MG tablet Commonly known as:  NORCO Take  1-2 tablets by mouth every 6 (six) hours as needed for moderate pain.   isosorbide mononitrate 30 MG 24 hr tablet Commonly known as:  IMDUR Take 0.5 tablets (15 mg total) by mouth daily. What changed:  how much to take   levETIRAcetam 750 MG tablet Commonly known as:  KEPPRA Take 1 tablet (750 mg total) by mouth every 12 (twelve) hours.   nitroGLYCERIN 0.4 MG SL tablet Commonly known as:  NITROSTAT Place 0.4 mg under the tongue every 5 (five) minutes as needed for chest pain.   omeprazole 20 MG capsule Commonly known as:  PRILOSEC Take 20 mg by mouth daily.   ondansetron 4 MG disintegrating tablet Commonly known as:  ZOFRAN ODT Take 1 tablet (4 mg total) by mouth every 8 (eight) hours as needed for nausea or vomiting.   pregabalin 75 MG capsule Commonly known as:  LYRICA Take 75 mg by  mouth at bedtime.   tiotropium 18 MCG inhalation capsule Commonly known as:  SPIRIVA Place 18 mcg into inhaler and inhale daily.   Vitamin D (Ergocalciferol) 50000 units Caps capsule Commonly known as:  DRISDOL Take 50,000 Units by mouth every 7 (seven) days. Take on Sundays   VITAMIN D PO Take 1,000 Units by mouth daily.       Follow-up Information    BADGER,MICHAEL C, MD Follow up.   Specialty:  Family Medicine Contact information: [address] [place] [address] [phone]            The results of significant diagnostics from this hospitalization (including imaging, microbiology, ancillary and laboratory) are listed below for reference.     Microbiology: Recent Results (from the past 240 hour(s))  MRSA PCR Screening     Status: None   Collection Time: 08/13/16  1:56 AM  Result Value Ref Range Status   MRSA by PCR NEGATIVE NEGATIVE Final    Comment:        The GeneXpert MRSA Assay (FDA approved for NASAL specimens only), is one component of a comprehensive MRSA colonization surveillance program. It is not intended to diagnose MRSA infection nor to  guide or monitor treatment for MRSA infections.   Culture, blood (x 2)     Status: None (Preliminary result)   Collection Time: 08/13/16  2:37 AM  Result Value Ref Range Status   Specimen Description BLOOD RIGHT ARM  Final   Special Requests BOTTLES DRAWN AEROBIC AND ANAEROBIC 5ML  Final   Culture NO GROWTH 3 DAYS  Final   Report Status PENDING  Incomplete  Culture, blood (x 2)     Status: None (Preliminary result)   Collection Time: 08/13/16  2:45 AM  Result Value Ref Range Status   Specimen Description BLOOD RIGHT ARM  Final   Special Requests BOTTLES DRAWN AEROBIC AND ANAEROBIC 5ML  Final   Culture NO GROWTH 3 DAYS  Final   Report Status PENDING  Incomplete     Labs: Basic Metabolic Panel:  Recent Labs Lab 08/13/16 0237 08/14/16 0335 08/15/16 0311 08/16/16 0240 08/17/16 0456  NA 138 137 138 138 140  K 3.6 4.4 3.7 3.8 3.8  CL 100* 102 100* 100* 99*  CO2 28 29 34* 32 34*  GLUCOSE 158* 128* 97 114* 96  BUN 41* 27* 25* 18 16  CREATININE 1.50* 1.14* 0.98 0.94 0.90  CALCIUM 8.8* 8.8* 8.8* 9.2 9.1  MG 1.7  --   --   --   --   PHOS 3.7  --   --   --   --    Liver Function Tests:  Recent Labs Lab 08/12/16 1942 08/13/16 0237  AST 23 23  ALT 13* 11*  ALKPHOS 46 41  BILITOT 0.5 0.4  PROT 6.6 6.1*  ALBUMIN 3.7 3.3*   No results for input(s): LIPASE, AMYLASE in the last 168 hours. No results for input(s): AMMONIA in the last 168 hours. CBC:  Recent Labs Lab 08/13/16 0237 08/14/16 0335 08/15/16 0311 08/16/16 0240 08/17/16 0456  WBC 13.1* 8.9 7.6 7.8 9.3  HGB 12.8 9.9* 9.3* 8.9* 8.8*  HCT 36.9 29.3* 27.4* 27.0* 27.1*  MCV 93.4 96.7 94.5 94.7 95.4  PLT 322 274 283 312 340   Cardiac Enzymes:  Recent Labs Lab 08/13/16 0237 08/13/16 0705 08/13/16 1257  TROPONINI 0.05* 0.04* 0.05*   SIGNED: Time coordinating discharge: 30 minutes  Alaiyah Bollman U Sharnay Cashion, DO  [facility] 08/17/2016, 9:44 AM  If 7PM-7AM, please contact  night-coverage [email or web address] Password [email or web address]

## 2016-08-17 NOTE — Progress Notes (Signed)
Physical Therapy Treatment Patient Details Name: [name] [name] MRN: [id number] DOB: 06-17-1927 Today's Date: 08/17/2016    History of Present Illness 82 y.o. female with medical history significant of falls, COPD,CKD, CHF, dementia, hip fracture  and admitted for Acute renal failure secondary to dehydration and evidence of SIRS     PT Comments    Pt transferred out of bed and back in with min A. Pt fatigues very quickly and she did not feel she could maintain sitting up after session because leaving to go to SNF soon. Pt needs much encouragement to mobilize. PT will continue to follow.   Follow Up Recommendations  SNF;Supervision/Assistance - 24 hour     Equipment Recommendations  None recommended by PT    Recommendations for Other Services       Precautions / Restrictions Precautions Precautions: Fall Precaution Comments: high fall risk  Restrictions Weight Bearing Restrictions: No    Mobility  Bed Mobility Overal bed mobility: Needs Assistance Bed Mobility: Supine to Sit;Sit to Supine     Supine to sit: Min assist Sit to supine: Min assist   General bed mobility comments: min A for legs off bed. Min A for legs back into bed  Transfers Overall transfer level: Needs assistance Equipment used: 1 person hand held assist Transfers: Sit to/from [employer]GI CorporationStand;Stand Pivot Transfers Sit to Stand: Min assist Stand pivot transfers: Min assist       General transfer comment: pt stood and pivoted with increased time and flexed posture. Min A for support. Pt fearful of falling. Pt fatigued after first transfer, little more support needed second time.   Ambulation/Gait                 Stairs            Wheelchair Mobility    Modified Rankin (Stroke Patients Only)       Balance Overall balance assessment: Needs assistance Sitting-balance support: Single extremity supported Sitting balance-Leahy Scale: Fair     Standing balance support: Bilateral upper  extremity supported Standing balance-Leahy Scale: Poor                      Cognition Arousal/Alertness: Awake/alert Behavior During Therapy: Flat affect Overall Cognitive Status: History of cognitive impairments - at baseline                 General Comments: pt not oriented to place or situation     Exercises      General Comments General comments (skin integrity, edema, etc.): pt very fatigued after transfers and did not want to remain sitting up, requested to lie back down      Pertinent Vitals/Pain Pain Assessment: No/denies pain    Home Living                      Prior Function            PT Goals (current goals can now be found in the care plan section) Acute Rehab PT Goals Patient Stated Goal: none stated PT Goal Formulation: With patient Time For Goal Achievement: 08/28/16 Potential to Achieve Goals: Fair Progress towards PT goals: Progressing toward goals    Frequency    Min 2X/week      PT Plan Current plan remains appropriate    Co-evaluation             End of Session Equipment Utilized During Treatment: Gait belt;Oxygen Activity Tolerance: Patient limited by fatigue Patient  left: in bed;with call bell/phone within reach;with bed alarm set     Time: 1140-1150 PT Time Calculation (min) (ACUTE ONLY): 10 min  Charges:  $Therapeutic Activity: 8-22 mins                    G Codes:     Lyanne CoVictoria Akyla Vavrek, PT  Acute Rehab Services  [phone]  [place]ia L Kiaraliz Rafuse 08/17/2016, 2:00 PM

## 2016-08-18 LAB — CULTURE, BLOOD (ROUTINE X 2)
CULTURE: NO GROWTH
CULTURE: NO GROWTH

## 2016-09-05 NOTE — Progress Notes (Signed)
Cardiology Office Note    Date:  09/06/2016   ID:  [name] GoldenAgnes G Edling, DOB 28-Aug-1926, MRN [id number]  PCP:  Eartha InchBADGER,MICHAEL C, MD  Cardiologist:  Dr. Shirlee LatchMcLean  Chief Complaint: Hospital follow up for afib.   History of Present Illness:   [name] Goldengnes G [name] is a 83 y.o. female with hx of CAD, chronic diastolic CHF, HTN, HLD, IDA, dementia and COPD who recently admitted presents for follow up.   Left heart cath in 2004 showed 70% ostial LAD stenosis and 50-60% proximal ramus disease.  It was decided to manage her medically. Echo (6/08) showed EF 55-60% with grade I diastolic dysfunction, mild LAE.  Myoview (5/11) showed EF 74% and was a normal study.    She was doing well on cardiac stand point when last seen by Dr. Shirlee LatchMcLean 12/2013.   Admitted 08/12/16-08/17/16 after fall. Found to have acute on CKD, likely related to dehydration and ? SIRS.  Echo during admission 07/2016 showed LV EF of 60-65%, mild LVH, grade 1 DD, no wm abnormality. During admission patient went into A. fib with RVR at rate of 100s (strip saved under CV strip - 08/12/16). Started on IV Cardizem and then transition to by mouth when the rate was controlled. Cardizem frequency lowered to BID due to soft blood pressure during admission. She was not anticoagulated due to history of frequent falls.  Discharged to SNF. Plan is to follow up with PCP in 1-2 weeks with CMP and CBC.  Here from [facility] with sister. She has no complaints. At facility, she participate in activities without any chest pain or dyspnea significantly. Denies any orthopnea, PND, syncope, lower extremity edema, melena or blood in her stool or urine. She rides her wheelchair by hands.    Past Medical History:  Diagnosis Date  . Anemia    a. Chronic iron deficiency anemia  . Anxiety   . CAD (coronary artery disease)    a. Cath 2004: 70% ostial LAD, 50-60% prox ramus disease, treated medically. b. Nuc 12/2009 - EF 74%, normal study.  . Chronic diastolic CHF (congestive  heart failure) (HCC)    a. Echo 02/2014: mild focal basal hypertrophy of septum, Normal LV function EF 55-60%; grade 1 diastolic dysfunction; mildly elevated PASP.  Marland Kitchen. CKD (chronic kidney disease), stage III   . COPD (chronic obstructive pulmonary disease) (HCC)   . Coronary artery disease   . Depression   . Dizziness   . Fatigue   . GERD (gastroesophageal reflux disease)   . Hyperlipidemia   . Hypertension   . Memory disorder   . Seizure disorder (HCC)   . Syncope and collapse    RECURRENT, RELATED TO HYPONATREMIA, NARCOTICS, AND BRADYCARDIA  . Tobacco abuse    PAST    Past Surgical History:  Procedure Laterality Date  . ABDOMINAL HYSTERECTOMY     early 1970's for fibroid  . CARDIAC CATHETERIZATION  2004  . HIP ARTHROPLASTY Right 12/05/2014   Procedure: ARTHROPLASTY BIPOLAR HIP;  Surgeon: Jodi GeraldsJohn Graves, MD;  Location: [facility] OR;  Service: Orthopedics;  Laterality: Right;  . ORIF PERIPROSTHETIC FRACTURE Right 01/18/2015   Procedure: OPEN REDUCTION INTERNAL FIXATION (ORIF) PERIPROSTHETIC FRACTURE;  Surgeon: Sheral Apleyimothy D Murphy, MD;  Location: [facility] OR;  Service: Orthopedics;  Laterality: Right;    Current Medications: Prior to Admission medications   Medication Sig Start Date End Date Taking? Authorizing Provider  acetaminophen (TYLENOL) 500 MG tablet Take 1,000 mg by mouth every 6 (six) hours as needed. For pain.  Historical Provider, MD  albuterol (PROVENTIL HFA;VENTOLIN HFA) 108 (90 BASE) MCG/ACT inhaler Inhale 2 puffs into the lungs every 4 (four) hours as needed for wheezing or shortness of breath. 05/28/15   Azalia Bilis, MD  aspirin EC 325 MG EC tablet Take 1 tablet (325 mg total) by mouth daily with breakfast. 01/21/15   Calvert Cantor, MD  atorvastatin (LIPITOR) 10 MG tablet Take 10 mg by mouth daily. 01/16/15   Historical Provider, MD  Calcium Carbonate-Vitamin D (CALCIUM 600 + D PO) Take 1 tablet by mouth daily.     Historical Provider, MD  Cholecalciferol (VITAMIN D PO) Take 1,000 Units  by mouth daily.     Historical Provider, MD  clopidogrel (PLAVIX) 75 MG tablet Take 75 mg by mouth daily.    Historical Provider, MD  diltiazem (CARDIZEM) 30 MG tablet Take 1 tablet (30 mg total) by mouth every 12 (twelve) hours. 08/16/16   Dorothea Ogle, MD  docusate sodium (COLACE) 100 MG capsule Take 1 capsule (100 mg total) by mouth 2 (two) times daily. 01/21/15   Calvert Cantor, MD  escitalopram (LEXAPRO) 5 MG tablet Take 5 mg by mouth daily.    Historical Provider, MD  feeding supplement, ENSURE ENLIVE, (ENSURE ENLIVE) LIQD Take 237 mLs by mouth 2 (two) times daily between meals. 01/21/15   Calvert Cantor, MD  ferrous sulfate 325 (65 FE) MG tablet Take 1 tablet (325 mg total) by mouth 3 (three) times daily with meals. 01/21/15   Calvert Cantor, MD  furosemide (LASIX) 20 MG tablet Take 1 tablet (20 mg total) by mouth daily. 01/21/15   Calvert Cantor, MD  HYDROcodone-acetaminophen (NORCO) 5-325 MG tablet Take 1-2 tablets by mouth every 6 (six) hours as needed for moderate pain. 08/16/16   Dorothea Ogle, MD  isosorbide mononitrate (IMDUR) 30 MG 24 hr tablet Take 0.5 tablets (15 mg total) by mouth daily. 08/16/16   Dorothea Ogle, MD  levETIRAcetam (KEPPRA) 750 MG tablet Take 1 tablet (750 mg total) by mouth every 12 (twelve) hours. 01/22/15   Calvert Cantor, MD  nitroGLYCERIN (NITROSTAT) 0.4 MG SL tablet Place 0.4 mg under the tongue every 5 (five) minutes as needed for chest pain.    Historical Provider, MD  omeprazole (PRILOSEC) 20 MG capsule Take 20 mg by mouth daily.    Historical Provider, MD  ondansetron (ZOFRAN ODT) 4 MG disintegrating tablet Take 1 tablet (4 mg total) by mouth every 8 (eight) hours as needed for nausea or vomiting. 08/21/14   [name], DO  pregabalin (LYRICA) 75 MG capsule Take 75 mg by mouth at bedtime. 11/27/14   Historical Provider, MD  tiotropium (SPIRIVA) 18 MCG inhalation capsule Place 18 mcg into inhaler and inhale daily. 11/26/14   Historical Provider, MD  Vitamin D, Ergocalciferol,  (DRISDOL) 50000 units CAPS capsule Take 50,000 Units by mouth every 7 (seven) days. Take on Sundays    Historical Provider, MD    Allergies:   Codeine   Social History   Social History  . Marital status: Widowed    Spouse name: N/A  . Number of children: N/A  . Years of education: N/A   Social History Main Topics  . Smoking status: Former Smoker    Packs/day: 1.00    Years: 40.00    Quit date: 08/23/1999  . Smokeless tobacco: Never Used  . Alcohol use No  . Drug use: No  . Sexual activity: Not on file   Other Topics Concern  . Not  on file   Social History Narrative   Housewife   2 Children   Widowed     Family History:  The patient's family history includes CAD in her father; Congestive Heart Failure in her mother; Coronary artery disease in her sister; Heart attack in her father; Heart failure in her mother; Other in her brother.   ROS:   Please see the history of present illness.    ROS All other systems reviewed and are negative.   PHYSICAL EXAM:   VS:  BP 100/60 (BP Location: Right Arm, Patient Position: Sitting, Cuff Size: Normal)   Pulse 88   Ht 5\' 3"  (1.6 m)   Wt 115 lb (52.2 kg)   BMI 20.37 kg/m    GEN: Well nourished, well developed, in no acute distress  HEENT: normal  Neck: no JVD, carotid bruits, or masses Cardiac: RRR; no murmurs, rubs, or gallops,no edema  Respiratory:  clear to auscultation bilaterally, normal work of breathing GI: soft, nontender, nondistended, + BS MS: no deformity or atrophy  Skin: warm and dry, no rash Neuro:  Alert and Oriented x 3, Strength and sensation are intact Psych: euthymic mood, full affect  Wt Readings from Last 3 Encounters:  09/06/16 115 lb (52.2 kg)  08/17/16 120 lb 8 oz (54.7 kg)  01/18/15 121 lb (54.9 kg)      Studies/Labs Reviewed:   EKG:  EKG is ordered today.  The ekg ordered today demonstrates NSR  Recent Labs: 08/13/2016: ALT 11; Magnesium 1.7; TSH 0.880 08/17/2016: BUN 16; Creatinine, Ser  0.90; Hemoglobin 8.8; Platelets 340; Potassium 3.8; Sodium 140   Lipid Panel    Component Value Date/Time   CHOL 163 06/04/2013 1703   TRIG 249.0 (H) 06/04/2013 1703   HDL 54.70 06/04/2013 1703   CHOLHDL 3 06/04/2013 1703   VLDL 49.8 (H) 06/04/2013 1703   LDLCALC 69 11/16/2011 0922   LDLDIRECT 79.0 06/04/2013 1703    Additional studies/ records that were reviewed today include:   Echocardiogram: 08/14/16 LV EF: 60% -   65%  ------------------------------------------------------------------- History:   PMH:   Dyspnea.  Coronary artery disease.  Congestive heart failure.  Risk factors:  Hypertension. Dyslipidemia.  ------------------------------------------------------------------- Study Conclusions  - Procedure narrative: Transthoracic echocardiography. Image   quality was adequate. The study was technically difficult, as a   result of poor acoustic windows and poor patient compliance. - Left ventricle: The cavity size was normal. Wall thickness was   increased in a pattern of mild LVH. Systolic function was normal.   The estimated ejection fraction was in the range of 60% to 65%.   Wall motion was normal; there were no regional wall motion   abnormalities. Doppler parameters are consistent with abnormal   left ventricular relaxation (grade 1 diastolic dysfunction). The   E/e&' ratio is between 8-15, suggesting indeterminate LV filling   pressure. - Aortic valve: Sclerosis without stenosis. Transvalvular velocity   was minimally increased. There was no regurgitation. - Mitral valve: Calcified annulus. There was trivial regurgitation. - Left atrium: The atrium was normal in size. - Inferior vena cava: The vessel was normal in size. The   respirophasic diameter changes were in the normal range (>= 50%),   consistent with normal central venous pressure.  Impressions:  - Technically difficult study. Definity contrast not given. LVEF   60-65% , mild LVH, normal wall  motion, diastolic dysfunction,   indeterminate LV filling pressure, aortic valve sclerosis, normal   &quot;LA size, trivial MR, normal IVC.  Cardiac Catheterization: 05/2003 ANGIOGRAPHIC DATA:  1. Right coronary artery:  The right coronary artery is a very large     dominant vessel.  There is 30-40% proximal narrowing that is somewhat     smooth in this large vessel.  There are two large branches to the     inferior wall.  2. The left main coronary artery is normal.  3. Left circumflex:  The left circumflex bifurcates early.  There are     irregularities, but no significant focal disease.  4. Intermediate:  The intermediate has a 50-60% narrowing proximally.  It is     a relatively small vessel.  5. Left anterior descending:  The left anterior descending has an ostial 70%     stenosis and then has atherosclerotic plaque in the proximal 2 cm of the     vessel.  The vessel itself is of moderate size with three diagonal     vessels.  It does end, however, on the anterior wall and does not cross     the apex.   LEFT VENTRICULAR ANGIOGRAM:  Left ventricular angiogram was performed in  RAO position.  Overall cardiac size and silhouette are normal.  Global  ejection fraction is 60-70%.  Regional wall motion is normal.   OVERALL IMPRESSION:  1. Normal left ventricular function.  2. Moderately severe ostial left anterior descending disease with proximal     disease present, moderate intermediate disease of 50-60% narrowing, mild     proximal right coronary artery disease and minimal, if any, left     circumflex disease.   DISCUSSION:  It is felt that we need to at least consider Anjelica as a  candidate for coronary artery bypass grafting.  She would not be a candidate  for intervention on the ostium of the left anterior descending given the  complex bifurcation of this vessel at this location and its involvement of  the intermediate and left circumflex.  It is felt that a stent would  cause  occlusion and snow plow into the other proximal branches.  Maybe the  decision for surgery needs to be further supported by functional studies in  addition her symptoms of significant angina.     ASSESSMENT & PLAN:    1. CAD - Cath as above. No angina. Continue Plavix, statin and imdur.  2. Chronic diastolic CHF - Euvolemic. Echo 07/2016 showed LV EF of 60-65%, mild LVH, grade 1 DD, no wm abnormality. Continue current medications.   3. HLD - Continue statin.   4. PAF - Maintaining sinus rhythm on EKG today. Likely episode of A. fib during admission due to dehydration. She does complains of intermittent palpitation lasting for a few second. Continue Cardizem 30 mg twice a day. Unable to titrate further due to softer low blood pressure. Not on anticoagulation due to fall risk. CHADSVASCs score of 5.   I will discussed with Dr. Shirlee Latch regarding follow-up with cardiologist.   After hospital discharge plan is to follow up with PCP in 1-2 weeks with CMP and CBC. I'm unsure that if she has follow-up or not. We will defer this to facility.    Medication Adjustments/Labs and Tests Ordered: Current medicines are reviewed at length with the patient today.  Concerns regarding medicines are outlined above.  Medication changes, Labs and Tests ordered today are listed in the Patient Instructions below. Patient Instructions  Medication Instructions: Your physician recommends that you continue on your current medications as directed. Please refer to the  Current Medication list given to you today.  Follow-Up: We will give you a call to follow-up with Dr. Shirlee Latch or PCP.  If you need a refill on your cardiac medications before your next appointment, please call your pharmacy.     Lorelei Pont, [place]  09/06/2016 2:43 PM    [facility] [address] [place], [place], [place]  [address] Phone: [phone]; Fax: [phone]

## 2016-09-06 ENCOUNTER — Ambulatory Visit (INDEPENDENT_AMBULATORY_CARE_PROVIDER_SITE_OTHER): Payer: Medicare Other | Admitting: Physician Assistant

## 2016-09-06 VITALS — BP 100/60 | HR 88 | Ht 63.0 in | Wt 115.0 lb

## 2016-09-06 DIAGNOSIS — I10 Essential (primary) hypertension: Secondary | ICD-10-CM | POA: Diagnosis not present

## 2016-09-06 DIAGNOSIS — I5032 Chronic diastolic (congestive) heart failure: Secondary | ICD-10-CM

## 2016-09-06 DIAGNOSIS — I480 Paroxysmal atrial fibrillation: Secondary | ICD-10-CM

## 2016-09-06 DIAGNOSIS — I2510 Atherosclerotic heart disease of native coronary artery without angina pectoris: Secondary | ICD-10-CM | POA: Diagnosis not present

## 2016-09-06 NOTE — Patient Instructions (Signed)
Medication Instructions: Your physician recommends that you continue on your current medications as directed. Please refer to the Current Medication list given to you today.  Follow-Up: We will give you a call to follow-up with Dr. Shirlee LatchMcLean or PCP.  If you need a refill on your cardiac medications before your next appointment, please call your pharmacy.

## 2016-09-07 NOTE — Progress Notes (Signed)
Followup with Dr. Okey DupreEnd.

## 2016-09-08 NOTE — Progress Notes (Signed)
Per Dr. Shirlee LatchMcLean, PT will f/u with Dr. Okey DupreEnd. Please make 3 months follow up.

## 2016-09-13 IMAGING — DX DG HIP (WITH OR WITHOUT PELVIS) 2-3V*R*
3 series · 3 of 3 positions shown · non-contrast
Comparison: None.

CLINICAL DATA: Right hip injury coming out of the bathroom

EXAM:
RIGHT HIP (WITH PELVIS) 2-3 VIEWS

[pelvis ap]
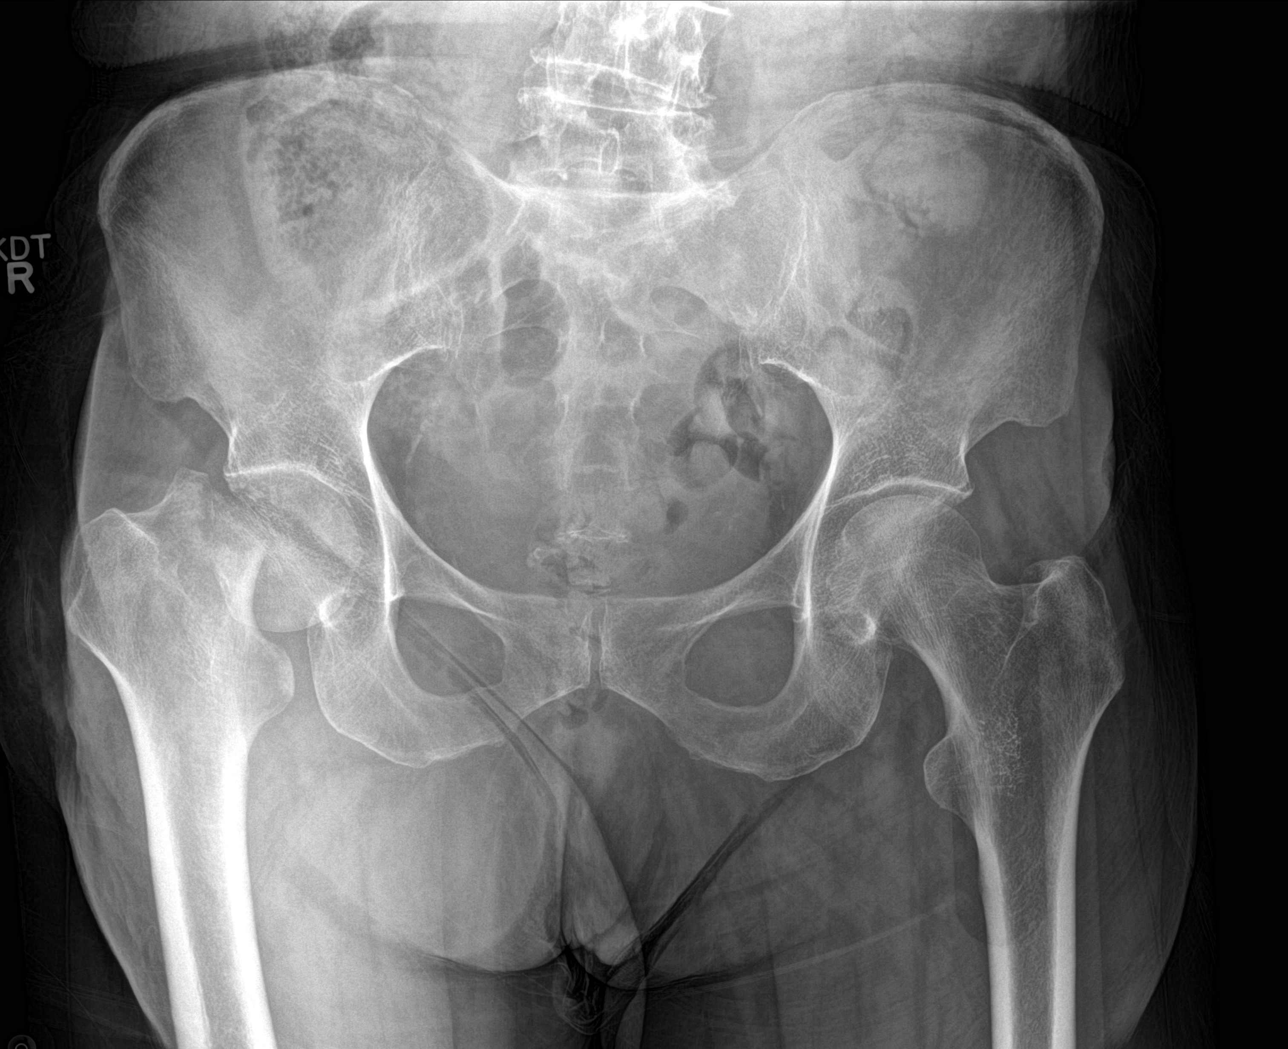

[hip ap]
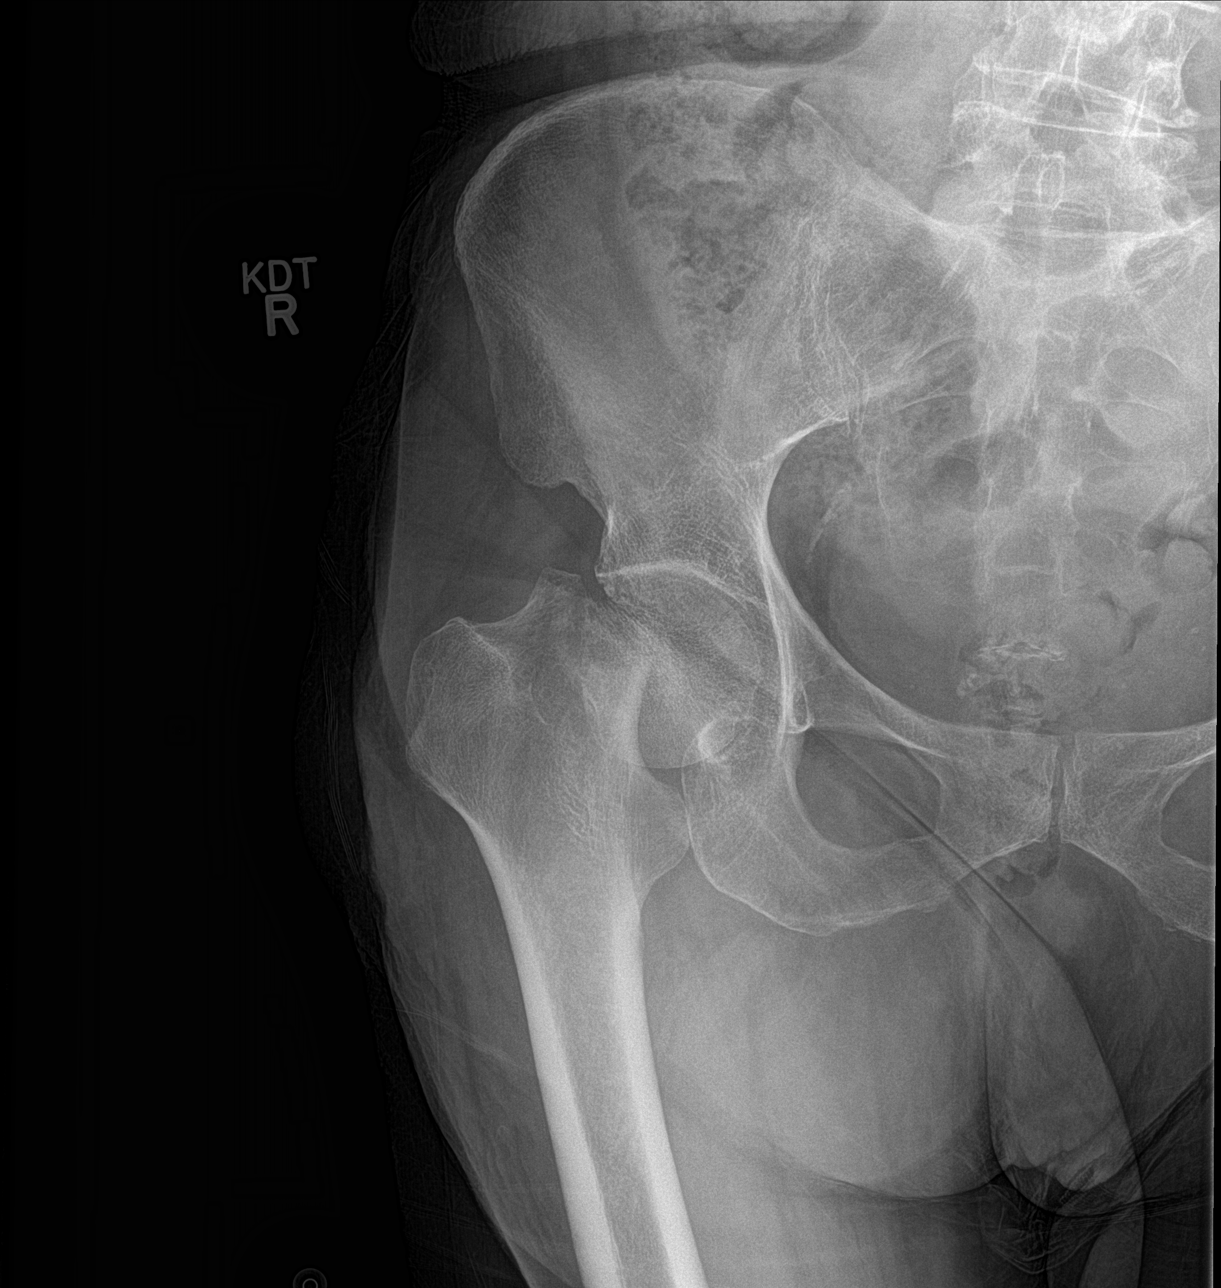

[hip lat]
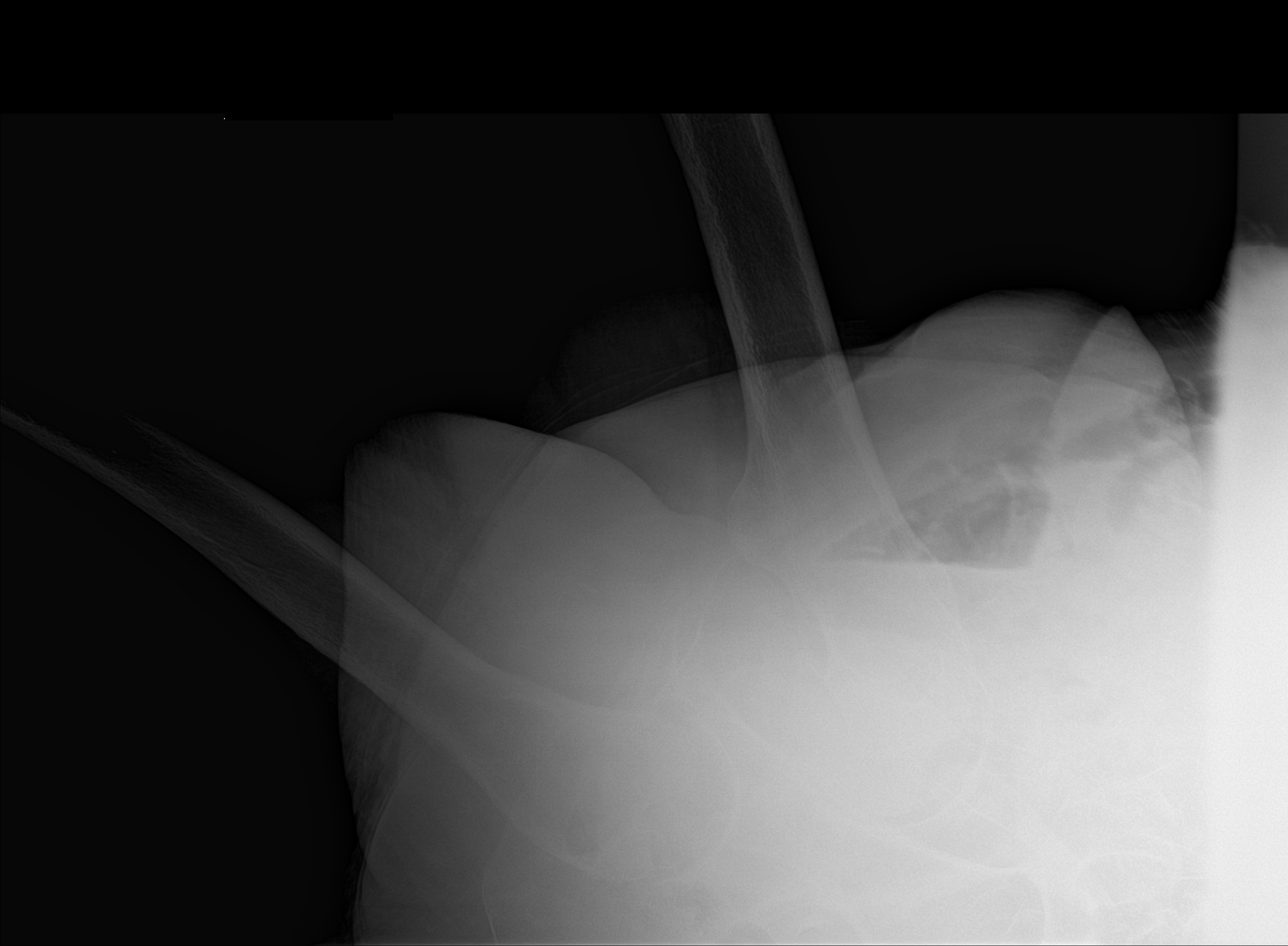

[3 of 3 positions shown; findings below may reference images not displayed]

FINDINGS: Right femoral neck fracture, transcervical or subcapital. The
fracture is displaced with femoral head rotation and varus
angulation. The femoral head remains located.

No evidence of pelvic ring fracture or diastasis. The left hip is
negative.

Osteopenia.
IMPRESSION: Displaced right femoral neck fracture.

## 2017-08-26 ENCOUNTER — Encounter (HOSPITAL_COMMUNITY): Payer: Self-pay | Admitting: Emergency Medicine

## 2017-08-26 ENCOUNTER — Emergency Department (HOSPITAL_COMMUNITY): Payer: Medicare Other

## 2017-08-26 ENCOUNTER — Other Ambulatory Visit: Payer: Self-pay

## 2017-08-26 ENCOUNTER — Inpatient Hospital Stay (HOSPITAL_COMMUNITY)
Admission: EM | Admit: 2017-08-26 | Discharge: 2017-08-29 | DRG: 871 | Disposition: A | Discharge status: Skilled Nursing Facility | Payer: Medicare Other | Attending: Family Medicine | Admitting: Family Medicine

## 2017-08-26 DIAGNOSIS — Z79899 Other long term (current) drug therapy: Secondary | ICD-10-CM

## 2017-08-26 DIAGNOSIS — R000 Tachycardia, unspecified: Secondary | ICD-10-CM | POA: Diagnosis present

## 2017-08-26 DIAGNOSIS — F0280 Dementia in other diseases classified elsewhere without behavioral disturbance: Secondary | ICD-10-CM | POA: Diagnosis not present

## 2017-08-26 DIAGNOSIS — Z515 Encounter for palliative care: Secondary | ICD-10-CM | POA: Diagnosis present

## 2017-08-26 DIAGNOSIS — R1310 Dysphagia, unspecified: Secondary | ICD-10-CM | POA: Diagnosis present

## 2017-08-26 DIAGNOSIS — J449 Chronic obstructive pulmonary disease, unspecified: Secondary | ICD-10-CM | POA: Diagnosis present

## 2017-08-26 DIAGNOSIS — Z96641 Presence of right artificial hip joint: Secondary | ICD-10-CM | POA: Diagnosis present

## 2017-08-26 DIAGNOSIS — N3000 Acute cystitis without hematuria: Secondary | ICD-10-CM | POA: Diagnosis present

## 2017-08-26 DIAGNOSIS — S32591A Other specified fracture of right pubis, initial encounter for closed fracture: Secondary | ICD-10-CM | POA: Diagnosis present

## 2017-08-26 DIAGNOSIS — I130 Hypertensive heart and chronic kidney disease with heart failure and stage 1 through stage 4 chronic kidney disease, or unspecified chronic kidney disease: Secondary | ICD-10-CM | POA: Diagnosis present

## 2017-08-26 DIAGNOSIS — Z66 Do not resuscitate: Secondary | ICD-10-CM

## 2017-08-26 DIAGNOSIS — N179 Acute kidney failure, unspecified: Secondary | ICD-10-CM

## 2017-08-26 DIAGNOSIS — N183 Chronic kidney disease, stage 3 (moderate): Secondary | ICD-10-CM | POA: Diagnosis present

## 2017-08-26 DIAGNOSIS — F419 Anxiety disorder, unspecified: Secondary | ICD-10-CM | POA: Diagnosis present

## 2017-08-26 DIAGNOSIS — E441 Mild protein-calorie malnutrition: Secondary | ICD-10-CM | POA: Diagnosis not present

## 2017-08-26 DIAGNOSIS — I4891 Unspecified atrial fibrillation: Secondary | ICD-10-CM | POA: Diagnosis present

## 2017-08-26 DIAGNOSIS — A419 Sepsis, unspecified organism: Secondary | ICD-10-CM | POA: Diagnosis present

## 2017-08-26 DIAGNOSIS — E785 Hyperlipidemia, unspecified: Secondary | ICD-10-CM | POA: Diagnosis present

## 2017-08-26 DIAGNOSIS — Z7401 Bed confinement status: Secondary | ICD-10-CM

## 2017-08-26 DIAGNOSIS — D509 Iron deficiency anemia, unspecified: Secondary | ICD-10-CM | POA: Diagnosis present

## 2017-08-26 DIAGNOSIS — Z6820 Body mass index (BMI) 20.0-20.9, adult: Secondary | ICD-10-CM

## 2017-08-26 DIAGNOSIS — K219 Gastro-esophageal reflux disease without esophagitis: Secondary | ICD-10-CM | POA: Diagnosis present

## 2017-08-26 DIAGNOSIS — G40909 Epilepsy, unspecified, not intractable, without status epilepticus: Secondary | ICD-10-CM | POA: Diagnosis present

## 2017-08-26 DIAGNOSIS — I5032 Chronic diastolic (congestive) heart failure: Secondary | ICD-10-CM | POA: Diagnosis present

## 2017-08-26 DIAGNOSIS — E872 Acidosis: Secondary | ICD-10-CM | POA: Diagnosis present

## 2017-08-26 DIAGNOSIS — E876 Hypokalemia: Secondary | ICD-10-CM | POA: Diagnosis present

## 2017-08-26 DIAGNOSIS — G8929 Other chronic pain: Secondary | ICD-10-CM | POA: Diagnosis present

## 2017-08-26 DIAGNOSIS — Z7189 Other specified counseling: Secondary | ICD-10-CM | POA: Diagnosis not present

## 2017-08-26 DIAGNOSIS — W19XXXA Unspecified fall, initial encounter: Secondary | ICD-10-CM | POA: Diagnosis present

## 2017-08-26 DIAGNOSIS — F0390 Unspecified dementia without behavioral disturbance: Secondary | ICD-10-CM | POA: Diagnosis not present

## 2017-08-26 DIAGNOSIS — F329 Major depressive disorder, single episode, unspecified: Secondary | ICD-10-CM | POA: Diagnosis present

## 2017-08-26 DIAGNOSIS — Z87891 Personal history of nicotine dependence: Secondary | ICD-10-CM

## 2017-08-26 DIAGNOSIS — E43 Unspecified severe protein-calorie malnutrition: Secondary | ICD-10-CM | POA: Diagnosis present

## 2017-08-26 DIAGNOSIS — R6510 Systemic inflammatory response syndrome (SIRS) of non-infectious origin without acute organ dysfunction: Secondary | ICD-10-CM | POA: Diagnosis not present

## 2017-08-26 DIAGNOSIS — Z7902 Long term (current) use of antithrombotics/antiplatelets: Secondary | ICD-10-CM

## 2017-08-26 DIAGNOSIS — I2510 Atherosclerotic heart disease of native coronary artery without angina pectoris: Secondary | ICD-10-CM | POA: Diagnosis present

## 2017-08-26 DIAGNOSIS — N390 Urinary tract infection, site not specified: Secondary | ICD-10-CM | POA: Diagnosis present

## 2017-08-26 DIAGNOSIS — Z7982 Long term (current) use of aspirin: Secondary | ICD-10-CM

## 2017-08-26 LAB — URINALYSIS, ROUTINE W REFLEX MICROSCOPIC
BILIRUBIN URINE: NEGATIVE
Glucose, UA: NEGATIVE mg/dL
KETONES UR: NEGATIVE mg/dL
Nitrite: NEGATIVE
Protein, ur: 30 mg/dL — AB
Specific Gravity, Urine: 1.015 (ref 1.005–1.030)
pH: 5 (ref 5.0–8.0)

## 2017-08-26 LAB — CBC WITH DIFFERENTIAL/PLATELET
BASOS ABS: 0 10*3/uL (ref 0.0–0.1)
Basophils Relative: 0 %
EOS PCT: 0 %
Eosinophils Absolute: 0 10*3/uL (ref 0.0–0.7)
HCT: 37.7 % (ref 36.0–46.0)
HEMOGLOBIN: 12.5 g/dL (ref 12.0–15.0)
LYMPHS ABS: 2.2 10*3/uL (ref 0.7–4.0)
LYMPHS PCT: 11 %
MCH: 30.9 pg (ref 26.0–34.0)
MCHC: 33.2 g/dL (ref 30.0–36.0)
MCV: 93.3 fL (ref 78.0–100.0)
Monocytes Absolute: 2 10*3/uL — ABNORMAL HIGH (ref 0.1–1.0)
Monocytes Relative: 11 %
NEUTROS PCT: 78 %
Neutro Abs: 14.8 10*3/uL — ABNORMAL HIGH (ref 1.7–7.7)
PLATELETS: 274 10*3/uL (ref 150–400)
RBC: 4.04 MIL/uL (ref 3.87–5.11)
RDW: 12.6 % (ref 11.5–15.5)
WBC: 19 10*3/uL — AB (ref 4.0–10.5)

## 2017-08-26 LAB — COMPREHENSIVE METABOLIC PANEL
ALT: 12 U/L — ABNORMAL LOW (ref 14–54)
ANION GAP: 10 (ref 5–15)
AST: 21 U/L (ref 15–41)
Albumin: 3.6 g/dL (ref 3.5–5.0)
Alkaline Phosphatase: 54 U/L (ref 38–126)
BILIRUBIN TOTAL: 0.9 mg/dL (ref 0.3–1.2)
BUN: 30 mg/dL — ABNORMAL HIGH (ref 6–20)
CHLORIDE: 96 mmol/L — AB (ref 101–111)
CO2: 26 mmol/L (ref 22–32)
Calcium: 9.5 mg/dL (ref 8.9–10.3)
Creatinine, Ser: 1.44 mg/dL — ABNORMAL HIGH (ref 0.44–1.00)
GFR, EST AFRICAN AMERICAN: 36 mL/min — AB (ref >60)
GFR, EST NON AFRICAN AMERICAN: 31 mL/min — AB (ref >60)
Glucose, Bld: 143 mg/dL — ABNORMAL HIGH (ref 65–99)
POTASSIUM: 4 mmol/L (ref 3.5–5.1)
Sodium: 132 mmol/L — ABNORMAL LOW (ref 135–145)
TOTAL PROTEIN: 6.7 g/dL (ref 6.5–8.1)

## 2017-08-26 LAB — I-STAT CG4 LACTIC ACID, ED
LACTIC ACID, VENOUS: 2.11 mmol/L — AB (ref 0.5–1.9)
Lactic Acid, Venous: 1.33 mmol/L (ref 0.5–1.9)

## 2017-08-26 LAB — INFLUENZA PANEL BY PCR (TYPE A & B)
Influenza A By PCR: NEGATIVE
Influenza B By PCR: NEGATIVE

## 2017-08-26 LAB — LACTIC ACID, PLASMA: Lactic Acid, Venous: 1.4 mmol/L (ref 0.5–1.9)

## 2017-08-26 MED ORDER — ACETAMINOPHEN 500 MG PO TABS
1000.0000 mg | ORAL_TABLET | Freq: Once | ORAL | Status: AC
  Administered 2017-08-26: 1000 mg via ORAL
  Filled 2017-08-26: qty 2

## 2017-08-26 MED ORDER — VANCOMYCIN HCL 500 MG IV SOLR
500.0000 mg | INTRAVENOUS | Status: DC
  Administered 2017-08-27: 500 mg via INTRAVENOUS
  Filled 2017-08-26 (×3): qty 500

## 2017-08-26 MED ORDER — PIPERACILLIN-TAZOBACTAM 3.375 G IVPB
3.3750 g | Freq: Three times a day (TID) | INTRAVENOUS | Status: DC
  Administered 2017-08-26 – 2017-08-28 (×6): 3.375 g via INTRAVENOUS
  Filled 2017-08-26 (×7): qty 50

## 2017-08-26 MED ORDER — SODIUM CHLORIDE 0.9 % IV BOLUS (SEPSIS)
500.0000 mL | Freq: Once | INTRAVENOUS | Status: AC
  Administered 2017-08-26: 500 mL via INTRAVENOUS

## 2017-08-26 MED ORDER — SODIUM CHLORIDE 0.9 % IV BOLUS (SEPSIS)
250.0000 mL | Freq: Once | INTRAVENOUS | Status: AC
  Administered 2017-08-26: 250 mL via INTRAVENOUS

## 2017-08-26 MED ORDER — ACETAMINOPHEN 325 MG PO TABS
650.0000 mg | ORAL_TABLET | Freq: Four times a day (QID) | ORAL | Status: DC | PRN
  Filled 2017-08-26: qty 2

## 2017-08-26 MED ORDER — TIOTROPIUM BROMIDE MONOHYDRATE 18 MCG IN CAPS
18.0000 ug | ORAL_CAPSULE | Freq: Every day | RESPIRATORY_TRACT | Status: DC
  Filled 2017-08-26: qty 5

## 2017-08-26 MED ORDER — VANCOMYCIN HCL IN DEXTROSE 1-5 GM/200ML-% IV SOLN
1000.0000 mg | Freq: Once | INTRAVENOUS | Status: AC
  Administered 2017-08-26: 1000 mg via INTRAVENOUS
  Filled 2017-08-26: qty 200

## 2017-08-26 MED ORDER — SODIUM CHLORIDE 0.9 % IV BOLUS (SEPSIS)
1000.0000 mL | Freq: Once | INTRAVENOUS | Status: AC
  Administered 2017-08-26: 1000 mL via INTRAVENOUS

## 2017-08-26 MED ORDER — DILTIAZEM HCL 30 MG PO TABS
30.0000 mg | ORAL_TABLET | Freq: Two times a day (BID) | ORAL | Status: DC
  Administered 2017-08-26 – 2017-08-27 (×2): 30 mg via ORAL
  Filled 2017-08-26 (×2): qty 1

## 2017-08-26 MED ORDER — ESCITALOPRAM OXALATE 10 MG PO TABS
5.0000 mg | ORAL_TABLET | Freq: Every day | ORAL | Status: DC
  Administered 2017-08-27 – 2017-08-29 (×3): 5 mg via ORAL
  Filled 2017-08-26 (×4): qty 1

## 2017-08-26 MED ORDER — CLOPIDOGREL BISULFATE 75 MG PO TABS
75.0000 mg | ORAL_TABLET | Freq: Every day | ORAL | Status: DC
  Administered 2017-08-26 – 2017-08-29 (×4): 75 mg via ORAL
  Filled 2017-08-26 (×4): qty 1

## 2017-08-26 MED ORDER — IPRATROPIUM BROMIDE 0.02 % IN SOLN: 0.5000 mg | RESPIRATORY_TRACT | Status: DC | PRN

## 2017-08-26 MED ORDER — ASPIRIN 325 MG PO TABS
325.0000 mg | ORAL_TABLET | Freq: Every day | ORAL | Status: DC
  Administered 2017-08-26 – 2017-08-29 (×4): 325 mg via ORAL
  Filled 2017-08-26 (×4): qty 1

## 2017-08-26 MED ORDER — IPRATROPIUM BROMIDE 0.02 % IN SOLN: 0.5000 mg | Freq: Two times a day (BID) | RESPIRATORY_TRACT | Status: DC

## 2017-08-26 MED ORDER — ISOSORBIDE MONONITRATE ER 30 MG PO TB24
15.0000 mg | ORAL_TABLET | Freq: Every day | ORAL | Status: DC
  Administered 2017-08-26: 15 mg via ORAL
  Filled 2017-08-26: qty 1

## 2017-08-26 MED ORDER — PREGABALIN 75 MG PO CAPS
75.0000 mg | ORAL_CAPSULE | Freq: Every day | ORAL | Status: DC
  Administered 2017-08-26 – 2017-08-28 (×3): 75 mg via ORAL
  Filled 2017-08-26 (×3): qty 1

## 2017-08-26 MED ORDER — PIPERACILLIN-TAZOBACTAM 3.375 G IVPB 30 MIN
3.3750 g | Freq: Once | INTRAVENOUS | Status: AC
  Administered 2017-08-26: 3.375 g via INTRAVENOUS
  Filled 2017-08-26: qty 50

## 2017-08-26 MED ORDER — FERROUS SULFATE 325 (65 FE) MG PO TABS
325.0000 mg | ORAL_TABLET | Freq: Every day | ORAL | Status: DC
  Administered 2017-08-27 – 2017-08-29 (×3): 325 mg via ORAL
  Filled 2017-08-26 (×3): qty 1

## 2017-08-26 MED ORDER — ENOXAPARIN SODIUM 30 MG/0.3ML ~~LOC~~ SOLN
30.0000 mg | SUBCUTANEOUS | Status: DC
  Administered 2017-08-26 – 2017-08-28 (×3): 30 mg via SUBCUTANEOUS
  Filled 2017-08-26 (×3): qty 0.3

## 2017-08-26 MED ORDER — ENSURE ENLIVE PO LIQD
237.0000 mL | Freq: Two times a day (BID) | ORAL | Status: DC
  Administered 2017-08-27 – 2017-08-29 (×4): 237 mL via ORAL

## 2017-08-26 MED ORDER — ALBUTEROL SULFATE (2.5 MG/3ML) 0.083% IN NEBU: 2.5000 mg | INHALATION_SOLUTION | RESPIRATORY_TRACT | Status: DC | PRN

## 2017-08-26 MED ORDER — ATORVASTATIN CALCIUM 10 MG PO TABS
10.0000 mg | ORAL_TABLET | Freq: Every day | ORAL | Status: DC
  Administered 2017-08-27 – 2017-08-28 (×2): 10 mg via ORAL
  Filled 2017-08-26 (×2): qty 1

## 2017-08-26 MED ORDER — ORAL CARE MOUTH RINSE
15.0000 mL | Freq: Two times a day (BID) | OROMUCOSAL | Status: DC
  Administered 2017-08-26 – 2017-08-29 (×6): 15 mL via OROMUCOSAL

## 2017-08-26 MED ORDER — SODIUM CHLORIDE 0.9 % IV SOLN
INTRAVENOUS | Status: DC
  Administered 2017-08-26 – 2017-08-28 (×3): via INTRAVENOUS

## 2017-08-26 MED ORDER — DOCUSATE SODIUM 100 MG PO CAPS
100.0000 mg | ORAL_CAPSULE | Freq: Two times a day (BID) | ORAL | Status: DC
  Administered 2017-08-26 – 2017-08-29 (×6): 100 mg via ORAL
  Filled 2017-08-26 (×6): qty 1

## 2017-08-26 MED ORDER — ALBUTEROL SULFATE HFA 108 (90 BASE) MCG/ACT IN AERS: 2.0000 | INHALATION_SPRAY | RESPIRATORY_TRACT | Status: DC | PRN

## 2017-08-26 MED ORDER — ACETAMINOPHEN 650 MG RE SUPP: 650.0000 mg | Freq: Four times a day (QID) | RECTAL | Status: DC | PRN

## 2017-08-26 MED ORDER — LEVETIRACETAM 750 MG PO TABS
750.0000 mg | ORAL_TABLET | Freq: Two times a day (BID) | ORAL | Status: DC
  Administered 2017-08-26 – 2017-08-29 (×7): 750 mg via ORAL
  Filled 2017-08-26 (×9): qty 1

## 2017-08-26 MED ORDER — TIOTROPIUM BROMIDE MONOHYDRATE 18 MCG IN CAPS
18.0000 ug | ORAL_CAPSULE | Freq: Every day | RESPIRATORY_TRACT | Status: DC
  Administered 2017-08-27 – 2017-08-29 (×2): 18 ug via RESPIRATORY_TRACT
  Filled 2017-08-26: qty 5

## 2017-08-26 MED ORDER — ONDANSETRON 4 MG PO TBDP
4.0000 mg | ORAL_TABLET | Freq: Three times a day (TID) | ORAL | Status: DC | PRN
  Administered 2017-08-27 – 2017-08-29 (×3): 4 mg via ORAL
  Filled 2017-08-26 (×3): qty 1

## 2017-08-26 NOTE — ED Triage Notes (Signed)
Pt to ED via [facility] from [facility] with c/o tachycardia, fever, pt was in and out cathed at nursing home yesterday for 900cc dark urine, possible uti. Pt is alert/ oriented x 4

## 2017-08-26 NOTE — ED Notes (Signed)
Pur Wick placed, instructions given for use. 

## 2017-08-26 NOTE — ED Provider Notes (Signed)
[facility] EMERGENCY DEPARTMENT Provider Note   CSN: [id number] Arrival date & time: 08/26/17  0747     History   Chief Complaint Chief Complaint  Patient presents with  . Fever  . Tachycardia  . Atrial Fibrillation    HPI [name] [name] is a 84 y.o. female.  HPI Patient sent from [place] nursing home.  Reportedly patient had tachycardia and fever.  Report is that yesterday they did in and out cath the patient had 900 cc of dark urine.  There was concern for UTI.  This morning when EMS picked the patient up, they report an episode of blood pressure to 70 systolic.  They gave 200 cc bolus of fluid and blood pressure went up to greater than 100.  The patient is alert and in no acute distress.  She is hard of hearing but cognitively intact.  She denies being aware of any symptoms.  She reports she felt fine until they came to get her out of her bed this morning.  She does report a recent fall with pain to the left hip. Past Medical History:  Diagnosis Date  . Anemia    a. Chronic iron deficiency anemia  . Anxiety   . CAD (coronary artery disease)    a. Cath 2004: 70% ostial LAD, 50-60% prox ramus disease, treated medically. b. Nuc 12/2009 - EF 74%, normal study.  . Chronic diastolic CHF (congestive heart failure) (HCC)    a. Echo 02/2014: mild focal basal hypertrophy of septum, Normal LV function EF 55-60%; grade 1 diastolic dysfunction; mildly elevated PASP.  Marland Kitchen CKD (chronic kidney disease), stage III (HCC)   . COPD (chronic obstructive pulmonary disease) (HCC)   . Coronary artery disease   . Depression   . Dizziness   . Fatigue   . GERD (gastroesophageal reflux disease)   . Hyperlipidemia   . Hypertension   . Memory disorder   . Seizure disorder (HCC)   . Syncope and collapse    RECURRENT, RELATED TO HYPONATREMIA, NARCOTICS, AND BRADYCARDIA  . Tobacco abuse    PAST    Patient Active Problem List   Diagnosis Date Noted  . Dehydration 08/12/2016  .  SIRS (systemic inflammatory response syndrome) (HCC) 08/12/2016  . Malnutrition of moderate degree (HCC) 01/19/2015  . Femur fracture (HCC) 01/18/2015  . Recurrent falls 01/18/2015  . Acute renal failure (HCC) 01/18/2015  . Dehydration with hyponatremia 01/18/2015  . FTT (failure to thrive) in adult 01/18/2015  . Severe protein-calorie malnutrition (HCC) 01/18/2015  . Hypokalemia 01/18/2015  . Periprosthetic fracture around internal prosthetic joint 01/18/2015  . Closed femur fracture (HCC)   . Fracture of femoral neck, right (HCC) 12/05/2014  . CKD (chronic kidney disease) stage 3, GFR 30-59 ml/min (HCC) 12/05/2014  . Diastolic CHF, chronic (HCC) 11/17/2011  . GERD (gastroesophageal reflux disease) 09/19/2011  . Orthostatic hypotension 05/11/2011  . Anxiety   . Seizure disorder (HCC)   . Hypertension   . Hyperlipidemia   . Coronary artery disease   . Anemia   . Tobacco abuse   . Memory disorder     Past Surgical History:  Procedure Laterality Date  . ABDOMINAL HYSTERECTOMY     early 1970's for fibroid  . CARDIAC CATHETERIZATION  2004  . HIP ARTHROPLASTY Right 12/05/2014   Procedure: ARTHROPLASTY BIPOLAR HIP;  Surgeon: Jodi Geralds, MD;  Location: [facility] OR;  Service: Orthopedics;  Laterality: Right;  . ORIF PERIPROSTHETIC FRACTURE Right 01/18/2015   Procedure: OPEN REDUCTION INTERNAL  FIXATION (ORIF) PERIPROSTHETIC FRACTURE;  Surgeon: Sheral Apley, MD;  Location: [facility] OR;  Service: Orthopedics;  Laterality: Right;    OB History    No data available       Home Medications    Prior to Admission medications   Medication Sig Start Date End Date Taking? Authorizing Provider  acetaminophen (TYLENOL) 500 MG tablet Take 1,000 mg by mouth every 6 (six) hours as needed. For pain.   Yes [provider]  aspirin 325 MG tablet Take 325 mg by mouth daily.   Yes [provider]  Calcium Carbonate-Vitamin D (CALCIUM 600 + D PO) Take 1 tablet by mouth daily.    Yes  [provider]  clopidogrel (PLAVIX) 75 MG tablet Take 75 mg by mouth daily.   Yes [provider]  diltiazem (CARDIZEM) 30 MG tablet Take 1 tablet (30 mg total) by mouth every 12 (twelve) hours. 08/16/16  Yes Dorothea Ogle, MD  docusate sodium (COLACE) 100 MG capsule Take 1 capsule (100 mg total) by mouth 2 (two) times daily. 01/21/15  Yes Calvert Cantor, MD  escitalopram (LEXAPRO) 5 MG tablet Take 5 mg by mouth daily.   Yes [provider]  ferrous sulfate 325 (65 FE) MG tablet Take 1 tablet (325 mg total) by mouth 3 (three) times daily with meals. Patient taking differently: Take 325 mg by mouth daily.  01/21/15  Yes Calvert Cantor, MD  ipratropium (ATROVENT) 0.02 % nebulizer solution Take 0.5 mg by nebulization 2 (two) times daily.   Yes [provider]  isosorbide mononitrate (IMDUR) 30 MG 24 hr tablet Take 0.5 tablets (15 mg total) by mouth daily. 08/16/16  Yes Dorothea Ogle, MD  levETIRAcetam (KEPPRA) 750 MG tablet Take 1 tablet (750 mg total) by mouth every 12 (twelve) hours. 01/22/15  Yes Calvert Cantor, MD  levofloxacin (LEVAQUIN) 750 MG tablet Take 750 mg by mouth every other day.   Yes [provider]  nitroGLYCERIN (NITROSTAT) 0.4 MG SL tablet Place 0.4 mg under the tongue every 5 (five) minutes as needed for chest pain.   Yes [provider]  ondansetron (ZOFRAN ODT) 4 MG disintegrating tablet Take 1 tablet (4 mg total) by mouth every 8 (eight) hours as needed for nausea or vomiting. 08/21/14  Yes Ward, Baxter Hire N, DO  pregabalin (LYRICA) 75 MG capsule Take 75 mg by mouth at bedtime. 11/27/14  Yes [provider]  tiotropium (SPIRIVA) 18 MCG inhalation capsule Place 18 mcg into inhaler and inhale daily. 11/26/14  Yes [provider]  Vitamin D, Ergocalciferol, (DRISDOL) 50000 units CAPS capsule Take 50,000 Units by mouth every 30 (thirty) days. Take on Sundays    Yes [provider]  albuterol (PROVENTIL HFA;VENTOLIN  HFA) 108 (90 BASE) MCG/ACT inhaler Inhale 2 puffs into the lungs every 4 (four) hours as needed for wheezing or shortness of breath. Patient not taking: Reported on 08/26/2017 05/28/15   Azalia Bilis, MD  feeding supplement, ENSURE ENLIVE, (ENSURE ENLIVE) LIQD Take 237 mLs by mouth 2 (two) times daily between meals. Patient not taking: Reported on 08/26/2017 01/21/15   Calvert Cantor, MD  furosemide (LASIX) 20 MG tablet Take 1 tablet (20 mg total) by mouth daily. 01/21/15   Calvert Cantor, MD  HYDROcodone-acetaminophen (NORCO) 5-325 MG tablet Take 1-2 tablets by mouth every 6 (six) hours as needed for moderate pain. Patient not taking: Reported on 08/26/2017 08/16/16   Dorothea Ogle, MD    Family History Family History  Problem Relation Age of Onset  . Heart attack Father   . CAD Father   . Heart failure Mother   . Congestive Heart Failure Mother   . Other Brother        cerebral hemmorhage  . Coronary artery disease Sister     Social History Social History   Tobacco Use  . Smoking status: Former Smoker    Packs/day: 1.00    Years: 40.00    Pack years: 40.00    Last attempt to quit: 08/23/1999    Years since quitting: 18.0  . Smokeless tobacco: Never Used  Substance Use Topics  . Alcohol use: No  . Drug use: No     Allergies   Codeine   Review of Systems Review of Systems 10 Systems reviewed and are negative for acute change except as noted in the HPI.  Physical Exam Updated Vital Signs BP (!) 112/59   Pulse (!) 111   Temp (!) 101.2 F (38.4 C) (Rectal)   Resp (!) 25   Wt 52.2 kg (115 lb)   SpO2 100%   BMI 20.37 kg/m   Physical Exam  Constitutional: She appears well-developed and well-nourished. No distress.  Patient is alert.  No respiratory distress.  Color good.  Patient is thin but well-developed for age.  HENT:  Head: Normocephalic and atraumatic.  Nose: Nose normal.  Mouth/Throat: Oropharynx is clear and moist.  Eyes: Conjunctivae and EOM are normal. Pupils  are equal, round, and reactive to light.  Neck: Neck supple.  Cardiovascular:  No murmur heard. Tachycardia.  Irregularly irregular.  Pulmonary/Chest: Effort normal. No respiratory distress.  No respiratory distress.  Good airflow.  Questionable faint wheeze right lower lung.  Left is clear to bases.  Abdominal: Soft. Bowel sounds are normal. She exhibits no distension. There is no tenderness. There is no guarding.  Genitourinary: Vagina normal.  Musculoskeletal: Normal range of motion. She exhibits no edema or tenderness.  No peripheral edema.  Calves are soft and nontender.  Skin condition excellent.  Patient does have subtle ecchymotic bruising to the point of the left hip.  She reports tenderness to palpation but is able to roll onto her side and flex without appearance of severe pain.  Neurological: She is alert. No cranial nerve deficit. She exhibits normal muscle tone. Coordination normal.  Skin: Skin is warm and dry.  Psychiatric: She has a normal mood and affect.  Nursing note and vitals reviewed.    ED Treatments / Results  Labs (all labs ordered are listed, but only abnormal results are displayed) Labs Reviewed  COMPREHENSIVE METABOLIC PANEL - Abnormal; Notable for the following components:      Result Value   Sodium 132 (*)    Chloride 96 (*)    Glucose, Bld 143 (*)    BUN 30 (*)    Creatinine, Ser 1.44 (*)    ALT 12 (*)    GFR calc non Af Amer 31 (*)    GFR calc Af Amer 36 (*)    All other components within normal limits  CBC WITH DIFFERENTIAL/PLATELET - Abnormal; Notable for the following components:   WBC 19.0 (*)    Neutro Abs 14.8 (*)    Monocytes Absolute 2.0 (*)    All other components within normal limits  URINALYSIS, ROUTINE W REFLEX MICROSCOPIC - Abnormal; Notable for the following components:   APPearance HAZY (*)    Hgb urine dipstick MODERATE (*)    Protein, ur 30 (*)  Leukocytes, UA LARGE (*)    Bacteria, UA FEW (*)    Squamous Epithelial / LPF  0-5 (*)    All other components within normal limits  I-STAT CG4 LACTIC ACID, ED - Abnormal; Notable for the following components:   Lactic Acid, Venous 2.11 (*)    All other components within normal limits  CULTURE, BLOOD (ROUTINE X 2)  CULTURE, BLOOD (ROUTINE X 2)  URINE CULTURE  INFLUENZA PANEL BY PCR (TYPE A & B)  LACTIC ACID, PLASMA  I-STAT CG4 LACTIC ACID, ED    EKG  EKG Interpretation None       Radiology Dg Chest Port 1 View  Result Date: 08/26/2017 CLINICAL DATA:  Sepsis and fever. EXAM: PORTABLE CHEST 1 VIEW COMPARISON:  08/12/2016 chest radiograph FINDINGS: Cardiomegaly again noted. There is no evidence of focal airspace disease, pulmonary edema, suspicious pulmonary nodule/mass, pleural effusion, or pneumothorax. No acute bony abnormalities are identified. IMPRESSION: Cardiomegaly without evidence of acute cardiopulmonary disease. Electronically Signed   By: Harmon Pier M.D.   On: 08/26/2017 08:33   Dg Hip Unilat With Pelvis 2-3 Views Right  Result Date: 08/26/2017 CLINICAL DATA:  Fall EXAM: DG HIP (WITH OR WITHOUT PELVIS) 2-3V RIGHT COMPARISON:  None. FINDINGS: Right hip arthroplasty with cerclage wire fixation. No evidence of fracture. No hardware failure or loosening. Nondisplaced fracture involving the left superior pubic ramus. Mild degenerative changes of the lower lumbar spine. IMPRESSION: Nondisplaced fracture involving the left superior pubic ramus. Right hip arthroplasty, without evidence of complication. Electronically Signed   By: Charline Bills M.D.   On: 08/26/2017 09:21    Procedures Procedures (including critical care time) CRITICAL CARE Performed by: Cristy Friedlander   Total critical care time: 30 minutes  Critical care time was exclusive of separately billable procedures and treating other patients.  Critical care was necessary to treat or prevent imminent or life-threatening deterioration.  Critical care was time spent personally by me on the  following activities: development of treatment plan with patient and/or surrogate as well as nursing, discussions with consultants, evaluation of patient's response to treatment, examination of patient, obtaining history from patient or surrogate, ordering and performing treatments and interventions, ordering and review of laboratory studies, ordering and review of radiographic studies, pulse oximetry and re-evaluation of patient's condition. Medications Ordered in ED Medications  sodium chloride 0.9 % bolus 1,000 mL (not administered)    And  sodium chloride 0.9 % bolus 500 mL (0 mLs Intravenous Stopped 08/26/17 1053)    And  sodium chloride 0.9 % bolus 250 mL (0 mLs Intravenous Stopped 08/26/17 1147)  vancomycin (VANCOCIN) IVPB 1000 mg/200 mL premix (1,000 mg Intravenous New Bag/Given 08/26/17 1148)  piperacillin-tazobactam (ZOSYN) IVPB 3.375 g (not administered)  vancomycin (VANCOCIN) 500 mg in sodium chloride 0.9 % 100 mL IVPB (not administered)  piperacillin-tazobactam (ZOSYN) IVPB 3.375 g (0 g Intravenous Stopped 08/26/17 1159)  acetaminophen (TYLENOL) tablet 1,000 mg (1,000 mg Oral Given 08/26/17 1148)     Initial Impression / Assessment and Plan / ED Course  I have reviewed the triage vital signs and the nursing notes.  Pertinent labs & imaging results that were available during my care of the patient were reviewed by me and considered in my medical decision making (see chart for details).    Consult: Medical teaching service for admission.  Final Clinical Impressions(s) / ED Diagnoses   Final diagnoses:  Sepsis, due to unspecified organism [facility])  Acute cystitis without hematuria  AKI (acute kidney injury) (HCC)  Patient presents with findings consistent with sepsis.  Urinalysis identifies UTI.  Sepsis protocol initiated.  Patient has shown improvement with treatment.  Plan will be for admission. ED Discharge Orders    None       Arby BarrettePfeiffer, Frederich Montilla, MD 08/26/17 [phone]

## 2017-08-26 NOTE — Progress Notes (Signed)
Pharmacy Antibiotic Note  [name] [name] is a 84 y.o. female admitted on 08/26/2017 with sepsis.  Pharmacy has been consulted for Zosyn and vancomycin dosing.  One time doses ordered in the ED. SCr 1.44, CrCl ~5520ml/min  Plan: Start Zosyn 3.375 gm IV q8h (4 hour infusion) Start vancomycin 500mg  IV Q24h Monitor clinical picture, renal function, VT prn F/U C&S, abx deescalation / LOT   Weight: 115 lb (52.2 kg)  Temp (24hrs), Avg:100.3 F (37.9 C), Min:99.4 F (37.4 C), Max:101.2 F (38.4 C)  No results for input(s): WBC, CREATININE, LATICACIDVEN, VANCOTROUGH, VANCOPEAK, VANCORANDOM, GENTTROUGH, GENTPEAK, GENTRANDOM, TOBRATROUGH, TOBRAPEAK, TOBRARND, AMIKACINPEAK, AMIKACINTROU, AMIKACIN in the last 168 hours.  CrCl cannot be calculated (Patient's most recent lab result is older than the maximum 21 days allowed.).    Allergies  Allergen Reactions  . Codeine Nausea And Vomiting    Thank you for allowing pharmacy to be a part of this patient's care.  Armandina StammerBATCHELDER,Quiana Cobaugh J 08/26/2017 8:20 AM

## 2017-08-26 NOTE — H&P (Signed)
[facility] Admission History and Physical Service Pager: [phone]  Patient name: [name] [name] Medical record number: [id number] Date of birth: Jun 15, 1927 Age: 84 y.o. Gender: female  Primary Care Provider: Chesley Noon, MD Consultants: none Code Status: full  Chief Complaint: Sepsis  Assessment and Plan: [name] [name] is a 84 y.o. female presenting due to "turning blue" and unresponsiveness at her snf. Patient found to have large LE on UA. Given her septic picture this is the presumed source.  Sepsis Patient meeting SIRS criteria with fever, tachpnea, and tachycardia plus a presumed source of infection with her UA. Given these factors she is by definition septic. The most likely source is the UA with large LE. A cxr showed no focal consolidation. Blood cultures x2 were drawn prior to abx adminstration. Has improved from a vital sign standpoint with 69m/kg amount of fluid bolus in ed. Lactic Acid trending down from 2.11>1.33 Started on vanc and zosyn in ed. Will need to closely monitor vitals, and follow culture date. Will narrow abx when organisms known and patient becomes more stable. - admit to stepdown, Dr. HAndria Frames - vital signs q 4 hours, monitor fever curve - continuous pulse ox - NS @ 125 cc per hour  - Vancomycin, dosing per pharmacy - zosyn, doing per pharmacy - follow up blood and urine cultures - daily cbc, cmp - monitor I/O - O2 nasal cannula to maintain sats > 92% - pt/ot eval and treat - prn zofran for nausea  Right superior pubic ramus fracture Per Dr. DSharol Givenfrom orthopedics recommendations non-op, progressive weight bearing as tolerated on hip - pt/ot eval and treat - will return to snf on discharge  Atrial fibrillation Admitted back in December of 2017 and had afib with rvr. Takes Cardizem, not a candidate for anticoagulation due to multiple falls. Afib with RVR present on ekg in ed. Repeat EKG now with sinus rhythm  - continue  home cardizem - repeat ekg  HTN Has had some low blood pressures in the ed. Down to 91/60, 122/88 on last check. Holding lasix and IMDUR for now   CAD Patient with mild narrowing in mid proximal RCA of 56-60% per cath from 2004. Takes plavix, aspirin, lipitor.  - continue plavix, asa, statin  - holding home imdur  Diastolic CHF Last echo with ef 60-65%. Has grade I diastolic dysfunction.  COPD On 2L [place] in room. No wheezes on exam. PRN albuterol as needed. spiriva daily  Seizure disorder On keppra, will restart this medication. - continue keppra - continue lyrica  Chronic pain - Will continue lyrica  Questionable anemia Patient takes iron sulfate as outpatient. Upon review, ferritin in 500s, iron level nml, mcv 93. Doesn't appear to need this. Would recommend stopping as outpatient.  Depression Takes lexapro for unknown behavioral problem. Will restart.  Dementia: Orientated to self only, per nursing home this is her normal.   Dispo Likely back to snf once medically stable. Will consult social work to help facilitate this.   PMH is significant for COPD, CAD, CHF, Depression, GERD, HTN, HLD, Seizure, Dementia, Chronic Anemia,  FEN/GI: dysphagia 2, thin liquids, no straw, crush meds in pureed foods Prophylaxis: heparin  Disposition: back to snf  History of Present Illness:  [name] HUNTis a 84y.o. female presenting from her snf. The patient has baseline dementia and cannot accurately tell the team why she was brought into the hospital. Upon discussion with a member of the team at her  snf apparently she had "blue lips" and was unresponsive overnight on 1/4 at her facility. She was brought into the emergency department at some point after that, in the morning of 1/5. No much more detail could be given. She was found to be febrile (101.2), tachycardic (141). She met sepsis criteria. She was started on vanc and zosyn and was given bolus fluid to total up to 33m/kg of ns. Prior  to receiving her antibiotics blood and urine cultures were drawn.   Her labwork was significant for a cbc, cmp, lactic acid, ua, cxr, ekg, and hip xray. CBC significant for wbc of 19.0. CMP significant for na 132, bun 30, cr 1.44. Lactic acid initially 2.11 but trended down to 1.33 after fluid bolus and abx. UA showed large LE, negative nitrites, and moderate protein. CXR showed no focal consolidation. EKG showed Afib. In the ED the patient was found to have a bruise on her right thigh. A hip xray was obtained which showed a superior pubic rami fracture. Was discussed with Dr. DSharol Givenfrom orthopedics who recommended non-op, progressive load bearing as tolerated. She was admitted and for treatment for presumed sepsis of urological origin.  Review Of Systems: Per HPI with the following additions:  Review of Systems  Unable to perform ROS: Dementia    Patient Active Problem List   Diagnosis Date Noted  . Urinary tract infection 08/26/2017  . Dehydration 08/12/2016  . SIRS (systemic inflammatory response syndrome) ([place] 08/12/2016  . Malnutrition of moderate degree ([facility] 01/19/2015  . Femur fracture ([facility] 01/18/2015  . Recurrent falls 01/18/2015  . Acute renal failure ([facility] 01/18/2015  . Dehydration with hyponatremia 01/18/2015  . FTT (failure to thrive) in adult 01/18/2015  . Severe protein-calorie malnutrition ([facility] 01/18/2015  . Hypokalemia 01/18/2015  . Periprosthetic fracture around internal prosthetic joint 01/18/2015  . Closed femur fracture ([facility]   . Fracture of femoral neck, right ([facility] [facility] 12/05/2014  . CKD (chronic kidney disease) stage 3, GFR 30-59 ml/min (HCC) 12/05/2014  . Diastolic CHF, chronic ([facility] 11/17/2011  . GERD (gastroesophageal reflux disease) 09/19/2011  . Orthostatic hypotension 05/11/2011  . Anxiety   . Seizure disorder ([facility]   . Hypertension   . Hyperlipidemia   . Coronary artery disease   . Anemia   . Tobacco abuse   . Memory disorder     Past Medical  History: Past Medical History:  Diagnosis Date  . Anemia    a. Chronic iron deficiency anemia  . Anxiety   . CAD (coronary artery disease)    a. Cath 2004: 70% ostial LAD, 50-60% prox ramus disease, treated medically. b. Nuc 12/2009 - EF 74%, normal study.  . Chronic diastolic CHF (congestive heart failure) ([facility]    a. Echo 02/2014: mild focal basal hypertrophy of septum, Normal LV function EF 501-60% grade 1 diastolic dysfunction; mildly elevated PASP.  .Marland KitchenCKD (chronic kidney disease), stage III ([facility]   . COPD (chronic obstructive pulmonary disease) ([facility]   . Coronary artery disease   . Depression   . Dizziness   . Fatigue   . GERD (gastroesophageal reflux disease)   . Hyperlipidemia   . Hypertension   . Memory disorder   . Seizure disorder ([facility]   . Syncope and collapse    RECURRENT, RELATED TO HYPONATREMIA, NARCOTICS, AND BRADYCARDIA  . Tobacco abuse    PAST    Past Surgical History: Past Surgical History:  Procedure Laterality Date  . ABDOMINAL HYSTERECTOMY     early 1970's for fibroid  .  CARDIAC CATHETERIZATION  2004  . HIP ARTHROPLASTY Right 12/05/2014   Procedure: ARTHROPLASTY BIPOLAR HIP;  Surgeon: Dorna Leitz, MD;  Location: [place] [facility];  Service: Orthopedics;  Laterality: Right;  . ORIF PERIPROSTHETIC FRACTURE Right 01/18/2015   Procedure: OPEN REDUCTION INTERNAL FIXATION (ORIF) PERIPROSTHETIC FRACTURE;  Surgeon: Renette Butters, MD;  Location: [place];  Service: Orthopedics;  Laterality: Right;    Social History: Social History   Tobacco Use  . Smoking status: Former Smoker    Packs/day: 1.00    Years: 40.00    Pack years: 40.00    Last attempt to quit: 08/23/1999    Years since quitting: 18.0  . Smokeless tobacco: Never Used  Substance Use Topics  . Alcohol use: No  . Drug use: No   Additional social history:  Please also refer to relevant sections of EMR.  Family History: Family History  Problem Relation Age of Onset  . Heart attack Father   . CAD Father    . Heart failure Mother   . Congestive Heart Failure Mother   . Other Brother        cerebral hemmorhage  . Coronary artery disease Sister     Allergies and Medications: Allergies  Allergen Reactions  . Codeine Nausea And Vomiting   No current facility-administered medications on file prior to encounter.    Current Outpatient Medications on File Prior to Encounter  Medication Sig Dispense Refill  . acetaminophen (TYLENOL) 500 MG tablet Take 1,000 mg by mouth every 6 (six) hours as needed. For pain.    Marland Kitchen aspirin 325 MG tablet Take 325 mg by mouth daily.    . Calcium Carbonate-Vitamin D (CALCIUM 600 + D PO) Take 1 tablet by mouth daily.     . clopidogrel (PLAVIX) 75 MG tablet Take 75 mg by mouth daily.    Marland Kitchen diltiazem (CARDIZEM) 30 MG tablet Take 1 tablet (30 mg total) by mouth every 12 (twelve) hours. 60 tablet 0  . docusate sodium (COLACE) 100 MG capsule Take 1 capsule (100 mg total) by mouth 2 (two) times daily. 10 capsule 0  . escitalopram (LEXAPRO) 5 MG tablet Take 5 mg by mouth daily.    . ferrous sulfate 325 (65 FE) MG tablet Take 1 tablet (325 mg total) by mouth 3 (three) times daily with meals. (Patient taking differently: Take 325 mg by mouth daily. )  3  . ipratropium (ATROVENT) 0.02 % nebulizer solution Take 0.5 mg by nebulization 2 (two) times daily.    . isosorbide mononitrate (IMDUR) 30 MG 24 hr tablet Take 0.5 tablets (15 mg total) by mouth daily.    Marland Kitchen levETIRAcetam (KEPPRA) 750 MG tablet Take 1 tablet (750 mg total) by mouth every 12 (twelve) hours.    Marland Kitchen levofloxacin (LEVAQUIN) 750 MG tablet Take 750 mg by mouth every other day.    . nitroGLYCERIN (NITROSTAT) 0.4 MG SL tablet Place 0.4 mg under the tongue every 5 (five) minutes as needed for chest pain.    Marland Kitchen ondansetron (ZOFRAN ODT) 4 MG disintegrating tablet Take 1 tablet (4 mg total) by mouth every 8 (eight) hours as needed for nausea or vomiting. 20 tablet 0  . pregabalin (LYRICA) 75 MG capsule Take 75 mg by mouth at  bedtime.    Marland Kitchen tiotropium (SPIRIVA) 18 MCG inhalation capsule Place 18 mcg into inhaler and inhale daily.    . Vitamin D, Ergocalciferol, (DRISDOL) 50000 units CAPS capsule Take 50,000 Units by mouth every 30 (thirty) days. Take on Sundays     .  albuterol (PROVENTIL HFA;VENTOLIN HFA) 108 (90 BASE) MCG/ACT inhaler Inhale 2 puffs into the lungs every 4 (four) hours as needed for wheezing or shortness of breath. (Patient not taking: Reported on 08/26/2017) 1 Inhaler 0  . feeding supplement, ENSURE ENLIVE, (ENSURE ENLIVE) LIQD Take 237 mLs by mouth 2 (two) times daily between meals. (Patient not taking: Reported on 08/26/2017) 237 mL 12  . furosemide (LASIX) 20 MG tablet Take 1 tablet (20 mg total) by mouth daily. 30 tablet 0  . HYDROcodone-acetaminophen (NORCO) 5-325 MG tablet Take 1-2 tablets by mouth every 6 (six) hours as needed for moderate pain. (Patient not taking: Reported on 08/26/2017) 90 tablet 0    Objective: BP 115/65 (BP Location: Right Arm)   Pulse 96   Temp 98.8 F (37.1 C) (Oral)   Resp (!) 25   Wt 115 lb (52.2 kg)   SpO2 100%   BMI 20.37 kg/m  Exam: General: elderly frail caucasian female, alert, oriented to self, no acute distress, pleasant Eyes: eomi, arcus senilis present, perrl ENTM: no signs of external trauma to nose, ears, or head. Neck: no cervical lymphadenopathy appreciated Cardiovascular: tachycardic, no m/r/g appreciated, easily palpable radial pulse bilaterally Respiratory: difficult to assess respiratory status, patient could no cooperative with exam, no focal consolidation appreciated Gastrointestinal: soft, non-tender, non-distended MSK: 5/5 strength all extremities although exam limited by patient understanding Neuro: no focal neuro deficits, oriented to self, aox1 Psych: appropriate, demented  Labs and Imaging: CBC BMET  Recent Labs  Lab 08/26/17 0909  WBC 19.0*  HGB 12.5  HCT 37.7  PLT 274   Recent Labs  Lab 08/26/17 0909  NA 132*  K 4.0  CL 96*   CO2 26  BUN 30*  CREATININE 1.44*  GLUCOSE 143*  CALCIUM 9.5     Dg Chest Port 1 View  Result Date: 08/26/2017 CLINICAL DATA:  Sepsis and fever. EXAM: PORTABLE CHEST 1 VIEW COMPARISON:  08/12/2016 chest radiograph FINDINGS: Cardiomegaly again noted. There is no evidence of focal airspace disease, pulmonary edema, suspicious pulmonary nodule/mass, pleural effusion, or pneumothorax. No acute bony abnormalities are identified. IMPRESSION: Cardiomegaly without evidence of acute cardiopulmonary disease. Electronically Signed   By: Margarette Canada M.D.   On: 08/26/2017 08:33   Dg Hip Unilat With Pelvis 2-3 Views Right  Result Date: 08/26/2017 CLINICAL DATA:  Fall EXAM: DG HIP (WITH OR WITHOUT PELVIS) 2-3V RIGHT COMPARISON:  None. FINDINGS: Right hip arthroplasty with cerclage wire fixation. No evidence of fracture. No hardware failure or loosening. Nondisplaced fracture involving the left superior pubic ramus. Mild degenerative changes of the lower lumbar spine. IMPRESSION: Nondisplaced fracture involving the left superior pubic ramus. Right hip arthroplasty, without evidence of complication. Electronically Signed   By: Julian Hy M.D.   On: 08/26/2017 09:21     Guadalupe Dawn, MD 08/26/2017, 3:22 PM PGY-1, [place] Intern pager: [phone], text pages welcome  UPPER LEVEL ADDENDUM  I have read the above note and made revisions highlighted in blue.  Kerrin Mo, MD, PGY-3 [facility]

## 2017-08-26 NOTE — Progress Notes (Signed)
CSW acknowledging consult for this patient due to her coming from [facility]. CSW spoke with patient's current RN and RN stated that this patient was to be admitted. Handoff to be written to update inpatient CSW.  Edwin Dadaarol Machell Wirthlin, MSW, LCSW-A Weekend Clinical Social Worker [phone]

## 2017-08-27 DIAGNOSIS — I4891 Unspecified atrial fibrillation: Secondary | ICD-10-CM

## 2017-08-27 DIAGNOSIS — A419 Sepsis, unspecified organism: Secondary | ICD-10-CM

## 2017-08-27 DIAGNOSIS — F0390 Unspecified dementia without behavioral disturbance: Secondary | ICD-10-CM

## 2017-08-27 DIAGNOSIS — N179 Acute kidney failure, unspecified: Secondary | ICD-10-CM

## 2017-08-27 DIAGNOSIS — E441 Mild protein-calorie malnutrition: Secondary | ICD-10-CM

## 2017-08-27 LAB — MRSA PCR SCREENING: MRSA BY PCR: NEGATIVE

## 2017-08-27 LAB — CBC WITH DIFFERENTIAL/PLATELET
BASOS ABS: 0 10*3/uL (ref 0.0–0.1)
Basophils Relative: 0 %
EOS ABS: 0 10*3/uL (ref 0.0–0.7)
EOS PCT: 0 %
HCT: 32.4 % — ABNORMAL LOW (ref 36.0–46.0)
Hemoglobin: 10.3 g/dL — ABNORMAL LOW (ref 12.0–15.0)
LYMPHS ABS: 2 10*3/uL (ref 0.7–4.0)
Lymphocytes Relative: 15 %
MCH: 29.9 pg (ref 26.0–34.0)
MCHC: 31.8 g/dL (ref 30.0–36.0)
MCV: 94.2 fL (ref 78.0–100.0)
Monocytes Absolute: 1.6 10*3/uL — ABNORMAL HIGH (ref 0.1–1.0)
Monocytes Relative: 12 %
NEUTROS PCT: 73 %
Neutro Abs: 9.5 10*3/uL — ABNORMAL HIGH (ref 1.7–7.7)
PLATELETS: 245 10*3/uL (ref 150–400)
RBC: 3.44 MIL/uL — AB (ref 3.87–5.11)
RDW: 13.1 % (ref 11.5–15.5)
WBC: 13.1 10*3/uL — AB (ref 4.0–10.5)

## 2017-08-27 LAB — BASIC METABOLIC PANEL
Anion gap: 5 (ref 5–15)
BUN: 24 mg/dL — AB (ref 6–20)
CHLORIDE: 102 mmol/L (ref 101–111)
CO2: 26 mmol/L (ref 22–32)
CREATININE: 1.17 mg/dL — AB (ref 0.44–1.00)
Calcium: 8.9 mg/dL (ref 8.9–10.3)
GFR calc Af Amer: 47 mL/min — ABNORMAL LOW (ref >60)
GFR, EST NON AFRICAN AMERICAN: 40 mL/min — AB (ref >60)
Glucose, Bld: 129 mg/dL — ABNORMAL HIGH (ref 65–99)
POTASSIUM: 3.9 mmol/L (ref 3.5–5.1)
SODIUM: 133 mmol/L — AB (ref 135–145)

## 2017-08-27 LAB — URINE CULTURE: CULTURE: NO GROWTH

## 2017-08-27 LAB — GLUCOSE, CAPILLARY: GLUCOSE-CAPILLARY: 114 mg/dL — AB (ref 65–99)

## 2017-08-27 MED ORDER — DILTIAZEM HCL 30 MG PO TABS
30.0000 mg | ORAL_TABLET | Freq: Once | ORAL | Status: AC
  Administered 2017-08-27: 30 mg via ORAL
  Filled 2017-08-27: qty 1

## 2017-08-27 MED ORDER — DILTIAZEM HCL 60 MG PO TABS
60.0000 mg | ORAL_TABLET | Freq: Two times a day (BID) | ORAL | Status: DC
  Administered 2017-08-27: 60 mg via ORAL
  Filled 2017-08-27 (×2): qty 1

## 2017-08-27 NOTE — Progress Notes (Signed)
Family Medicine Teaching Service Daily Progress Note Intern Pager: [phone]  Patient name: [name] [name] Medical record number: [id number] Date of birth: [birth year] Age: 84 y.o. Gender: female  Primary Care Provider: [name], MD Consultants: None Code Status: Full  Pt Overview and Major Events to Date:  01/05: Admit for urosepsis 01/06: Continues to be in A. fib with RVR, stable, increased Cardizem  Assessment and Plan: [name] [name] is a 84 y.o. female presenting due to "turning blue" and unresponsiveness at her snf. Patient found to have large LE on UA. Given her septic picture this is the presumed source.  Urosepsis: Acute. Initially meeting SIRS criteria on admission with presumed urosepsis based on positive UA.  Blood cultures pending.  Received fluid resuscitation in ED.  Lactic acidosis resolved.  She received broad-spectrum antibiotics.  Remains afebrile. - Remains in SDU - Continuous pulse ox, O2 supplementation to keep sats >92% - Zosyn and vancomycin per pharmacy, day 2 - NS@125mL /hr - Trend fever curve and WBC - Daily weight, strict I&O's - Awaiting blood culture and urine culture results  Atrial fibrillation with RVR: Chronic.  Not rate controlled though stable.  Patient without chest pain or shortness of breath.  Takes Cardizem 30 mg twice daily at home.  Cardiologist Dr. Iver Nestle.  Presented in RVR on admission.  Remains in RVR. - Telemetry - Increasing home Cardizem to 60 mg twice daily - Repeat EKG - Continue home Plavix, aspirin, Lipitor - Holding home Imdur  Right superior pubic ramus fracture: New finding.  Per Dr. due to from orthopedics, recommendations for progressive weightbearing as tolerated on hip due to nonoperable state. - PT/OT recommendations - Plan to return to SNF on discharge  Coronary artery disease  HFpEF: Chronic.  Mild narrowing of the mid proximal RCA and EF 60-65% per cath 2004.  Home Plavix, aspirin, Lipitor. - Telemetry - See plan  above for atrial fibrillation  COPD: Chronic.  Does not presents with exacerbation. - Monitoring respiratory status  Seizure disorder: Chronic.  On Keppra at home. - Continue home Keppra  Chronic pain: Chronic.  On Lyrica at home. -Continue home Lyrica  Dementia: Oriented only to self, baseline per nursing home. - Monitoring  Depression: Chronic.  Takes Lexapro for unknown behavioral problem. - Continue home Lyrica  FEN/GI: Dysphagia to, thin liquids, no straws, crush meds in pureed foods Reflexes: Heparin SQ  Disposition: Pending improvement of urosepsis and stabilization of atrial fibrillation with RVR  Subjective:  Patient comfortable without complaints.  Denies chest pain or shortness of breath.  History is limited due to dementia.  Objective: Temp:  [97.9 F (36.6 C)-99.5 F (37.5 C)] 97.9 F (36.6 C) (01/06 0452) Pulse Rate:  [86-138] 138 (01/06 0900) Resp:  [20-28] 23 (01/06 0900) BP: (99-187)/(59-103) 156/94 (01/06 0939) SpO2:  [93 %-100 %] 99 % (01/06 0900) FiO2 (%):  [28 %] 28 % (01/06 0849) Weight:  [120 lb 2.4 oz (54.5 kg)] 120 lb 2.4 oz (54.5 kg) (01/05 2152) Physical Exam: General: elderly frail female, NAD with non-toxic appearance HEENT: normocephalic, atraumatic, moist mucous membranes Neck: supple, non-tender without lymphadenopathy Cardiovascular: irregularly irregular without murmurs, rubs, or gallops Lungs: clear to auscultation bilaterally with normal work of breathing Abdomen: soft, non-tender, non-distended, normoactive bowel sounds Skin: warm, dry, no rashes or lesions, cap refill < 2 seconds Extremities: warm and well perfused, normal tone, no edema Psych: pleasantly demented, euthymic mood, flat affect  Laboratory: Recent Labs  Lab 08/26/17 0909 08/27/17 0224  WBC 19.0*  13.1*  HGB 12.5 10.3*  HCT 37.7 32.4*  PLT 274 245   Recent Labs  Lab 08/26/17 0909 08/27/17 0224  NA 132* 133*  K 4.0 3.9  CL 96* 102  CO2 26 26  BUN 30* 24*   CREATININE 1.44* 1.17*  CALCIUM 9.5 8.9  PROT 6.7  --   BILITOT 0.9  --   ALKPHOS 54  --   ALT 12*  --   AST 21  --   GLUCOSE 143* 129*   UA: Negative Urine culture (1/5): Negative Blood culture x2 (1/5): Pending Lactic acid: 2.11>1.33>1.4 Influenza PCR: Negative  Imaging/Diagnostic Tests: DG HIP UNILATERAL W PELVIS 2-3 VIEWS RIGHT (08/26/17):  Nondisplaced fracture involving the left superior pubic ramus. Right hip arthroplasty, without evidence of complication.  DG CHEST PORT 1 VIEW (08/26/17):  Cardiomegaly without evidence of acute cardiopulmonary disease.    Wendee BeaversMcMullen, David J, DO 08/27/2017, 9:56 AM PGY-2, [facility] FPTS Intern pager: [phone], text pages welcome

## 2017-08-27 NOTE — Discharge Summary (Signed)
Family Medicine Teaching [facility] Discharge Summary  Patient name: [name] [name] Medical record number: [id number] Date of birth: Jan 13, 1927 Age: 84 y.o. Gender: female Date of Admission: 08/26/2017  Date of Discharge: 08/29/2016 Admitting Physician: Moses Manners, MD  Primary Care Provider: [name], MD Consultants: palliative care  Indication for Hospitalization: Urosepsis  Discharge Diagnoses/Problem List:  Urosepsis Afib with rvr Right superior pubic ramus fracture CAD Heart failure with preserved ejection fracture COPD Seizure disorder Chronic pain Dementia Depression Hypokalemia  Disposition: SNF  Discharge Condition: stable  Discharge Exam:  General: elderly frail female, in no acute distress, resting comfortably HEENT: normocephalic, atraumatic, mmm Neck: supple, non-tender without lymphadenopathy Cardiovascular: regular rate and rhythm, no m/r/g Lungs: CTAB, symmetric chest rise Abdomen: soft, non-tender, non-distended, normoactive bowel sounds Skin: warm, dry, no rashes or lesions, cap refill < 2 seconds Extremities: warm and well perfused, normal tone, no edema Psych: pleasantly demented, euthymic mood, flat affect  Brief Hospital Course:  84 year old female who presented on 1/5 with altered mental status, fever, tachypnea, and tachycardia. She had large leukocyte esterase on a UA and was treated for presumed urosepsis.  Urosepsis Following sepsis protocol she was given a large amount of fluid up to 66mL/hr. She was started on vanc and zosyn for broad spectrum antibiotics on admission. She continued to improve on antibiotics and fluids over the next 2 days. On 1/7 her mental status has improved to aox2, afebrile for 48 hours, and hr/bp were doing well. She was switched to oral keflex at that time. She was discharged on 1/8 to complete a 10 day course. Of note, her urine culture did have no growth. Blood culture with no growth.  Afib with  RVR Patient was admitted in afib with rvr on ekg. To rate control her afib her cardizem was increased from 30mg  to 60mg  bid on 1/6. After this switch she maintained rate control until discharge. She was changed to 120mg  CD cardizem prior to discharge.  Superior pubic rami fx  Discussed with ortho over the phone. Non-operative management. WBAT. PT/Ot encouraged at snf.  End of life discussion Palliative care consulted. Patient made DNR/DNI per family's request. Gold form filled out. Several medications held on discharge for comfort.  Issues for Follow Up:  1. Follow up mental status 2. Now DNR/DNi 3. Follow up how she is tolerating pain and weight bearing on hip fx  Significant Procedures:  Significant Labs and Imaging:  Recent Labs  Lab 08/27/17 0224 08/28/17 0221 08/29/17 0229  WBC 13.1* 10.8* 13.4*  HGB 10.3* 10.7* 9.9*  HCT 32.4* 33.2* 30.9*  PLT 245 250 269   Recent Labs  Lab 08/26/17 0909 08/27/17 0224 08/28/17 0221 08/29/17 0820  NA 132* 133* 137 137  K 4.0 3.9 3.6 3.3*  CL 96* 102 102 102  CO2 26 26 28 26   GLUCOSE 143* 129* 134* 136*  BUN 30* 24* 18 20  CREATININE 1.44* 1.17* 0.99 0.86  CALCIUM 9.5 8.9 8.8* 9.1  ALKPHOS 54  --  44  --   AST 21  --  16  --   ALT 12*  --  10*  --   ALBUMIN 3.6  --  2.9*  --     Results/Tests Pending at Time of Discharge:  Discharge Medications:  Allergies as of 08/29/2017      Reactions   Codeine Nausea And Vomiting      Medication List    STOP taking these medications   aspirin  325 MG tablet   CALCIUM 600 + D PO   diltiazem 30 MG tablet Commonly known as:  CARDIZEM   ferrous sulfate 325 (65 FE) MG tablet   levofloxacin 750 MG tablet Commonly known as:  LEVAQUIN   Vitamin D (Ergocalciferol) 50000 units Caps capsule Commonly known as:  DRISDOL     TAKE these medications   acetaminophen 500 MG tablet Commonly known as:  TYLENOL Take 1,000 mg by mouth every 6 (six) hours as needed. For pain.   albuterol  108 (90 Base) MCG/ACT inhaler Commonly known as:  PROVENTIL HFA;VENTOLIN HFA Inhale 2 puffs into the lungs every 4 (four) hours as needed for wheezing or shortness of breath.   cephALEXin 250 MG capsule Commonly known as:  KEFLEX Take 1 capsule (250 mg total) by mouth every 6 (six) hours for 7 days.   clopidogrel 75 MG tablet Commonly known as:  PLAVIX Take 75 mg by mouth daily.   diltiazem 120 MG 24 hr capsule Commonly known as:  CARDIZEM CD Take 1 capsule (120 mg total) by mouth daily. Start taking on:  08/30/2017   docusate sodium 100 MG capsule Commonly known as:  COLACE Take 1 capsule (100 mg total) by mouth 2 (two) times daily.   escitalopram 5 MG tablet Commonly known as:  LEXAPRO Take 5 mg by mouth daily.   feeding supplement (ENSURE ENLIVE) Liqd Take 237 mLs by mouth 2 (two) times daily between meals.   furosemide 20 MG tablet Commonly known as:  LASIX Take 1 tablet (20 mg total) by mouth daily.   HYDROcodone-acetaminophen 5-325 MG tablet Commonly known as:  NORCO Take 1-2 tablets by mouth every 6 (six) hours as needed for moderate pain.   ipratropium 0.02 % nebulizer solution Commonly known as:  ATROVENT Take 0.5 mg by nebulization 2 (two) times daily.   isosorbide mononitrate 30 MG 24 hr tablet Commonly known as:  IMDUR Take 0.5 tablets (15 mg total) by mouth daily.   levETIRAcetam 750 MG tablet Commonly known as:  KEPPRA Take 1 tablet (750 mg total) by mouth every 12 (twelve) hours.   nitroGLYCERIN 0.4 MG SL tablet Commonly known as:  NITROSTAT Place 0.4 mg under the tongue every 5 (five) minutes as needed for chest pain.   ondansetron 4 MG disintegrating tablet Commonly known as:  ZOFRAN ODT Take 1 tablet (4 mg total) by mouth every 8 (eight) hours as needed for nausea or vomiting.   pregabalin 75 MG capsule Commonly known as:  LYRICA Take 75 mg by mouth at bedtime.   tiotropium 18 MCG inhalation capsule Commonly known as:  SPIRIVA Place 18 mcg  into inhaler and inhale daily.       Discharge Instructions: Please refer to Patient Instructions section of EMR for full details.  Patient was counseled important signs and symptoms that should prompt return to medical care, changes in medications, dietary instructions, activity restrictions, and follow up appointments.   Follow-Up Appointments: Follow up with pcp in 1-2 weeks   Myrene BuddyFletcher, Ninah Moccio, MD 08/29/2017, 1:02 PM PGY-1, [facility]

## 2017-08-27 NOTE — Evaluation (Signed)
Physical Therapy Evaluation Patient Details Name: [name] [name] MRN: [id number] DOB: 08/15/1927 Today's Date: 08/27/2017   History of Present Illness  Pt is an 84 y.o. female admitted from SNF with sepsis likely from UA with large LE. Pt also with recent fall resulting in R superior pubic ramus fracture. PMH significant for anemia, anxiety, CAD, chronic diastolic CHF, CKD, COPD, coronary artery disease, depression, dizziness, fatigue, GERD, hyperlipidemia, hypertension, memory disorder, seizure disorder, and recurrent syncope and collapse.     Clinical Impression  Pt admitted with above diagnosis. Pt currently with functional limitations due to the deficits listed below (see PT Problem List). On eval, pt very lethargic requiring total assist supine <> sit transfer. Unable to maintain sitting EOB due to lethargy requiring return to supine. Pt will benefit from skilled PT to increase their independence and safety with mobility to allow discharge to the venue listed below.       Follow Up Recommendations SNF    Equipment Recommendations  None recommended by PT    Recommendations for Other Services       Precautions / Restrictions Precautions Precautions: Fall Restrictions LLE Weight Bearing: Weight bearing as tolerated      Mobility  Bed Mobility Overal bed mobility: Needs Assistance Bed Mobility: Supine to Sit;Sit to Supine     Supine to sit: Total assist Sit to supine: Total assist   General bed mobility comments: Pt lethargic. Unable to fully arouse even with transfer to EOB.   Transfers                 General transfer comment: unable   Ambulation/Gait             General Gait Details: unable  Stairs            Wheelchair Mobility    Modified Rankin (Stroke Patients Only)       Balance Overall balance assessment: Needs assistance Sitting-balance support: No upper extremity supported;Feet supported Sitting balance-Leahy Scale: Poor                                        Pertinent Vitals/Pain Pain Assessment: Faces Faces Pain Scale: Hurts little more Pain Location: Pelvis with LLE movement Pain Descriptors / Indicators: Grimacing;Guarding Pain Intervention(s): Limited activity within patient's tolerance;Monitored during session    Home Living Family/patient expects to be discharged to:: Skilled nursing facility                 Additional Comments: Pt has been at [facility] SN[facility] PTA    Prior Function Level of Independence: Needs assistance         Comments: Pt is a poor historian due to dementia. Per previous admission, pt primarily in wheelchair.     Hand Dominance   Dominant Hand: Right    Extremity/Trunk Assessment   Upper Extremity Assessment Upper Extremity Assessment: Defer to OT evaluation    Lower Extremity Assessment Lower Extremity Assessment: Generalized weakness    Cervical / Trunk Assessment Cervical / Trunk Assessment: Kyphotic  Communication   Communication: HOH  Cognition Arousal/Alertness: Lethargic Behavior During Therapy: Flat affect Overall Cognitive Status: History of cognitive impairments - at baseline                                 General Comments: Lethargic. Difficult to fully arouse.  General Comments      Exercises     Assessment/Plan    PT Assessment Patient needs continued PT services  PT Problem List Decreased strength;Decreased mobility;Decreased safety awareness;Decreased activity tolerance;Pain;Decreased balance       PT Treatment Interventions Therapeutic activities;Therapeutic exercise;Patient/family education;Balance training;Functional mobility training    PT Goals (Current goals can be found in the Care Plan section)  Acute Rehab PT Goals Patient Stated Goal: not stated PT Goal Formulation: Patient unable to participate in goal setting Time For Goal Achievement: 09/10/17 Potential to Achieve Goals: Fair     Frequency Min 2X/week   Barriers to discharge        Co-evaluation               AM-PAC PT "6 Clicks" Daily Activity  Outcome Measure Difficulty turning over in bed (including adjusting bedclothes, sheets and blankets)?: Unable Difficulty moving from lying on back to sitting on the side of the bed? : Unable Difficulty sitting down on and standing up from a chair with arms (e.g., wheelchair, bedside commode, etc,.)?: Unable Help needed moving to and from a bed to chair (including a wheelchair)?: A Lot Help needed walking in hospital room?: Total Help needed climbing 3-5 steps with a railing? : Total 6 Click Score: 7    End of Session   Activity Tolerance: Patient limited by lethargy Patient left: in bed;with call bell/phone within reach;with bed alarm set Nurse Communication: Mobility status PT Visit Diagnosis: Other abnormalities of gait and mobility (R26.89);Pain;History of falling (Z91.81);Muscle weakness (generalized) (M62.81)    Time: [id number] PT Time Calculation (min) (ACUTE ONLY): 11 min   Charges:   PT Evaluation $PT Eval Moderate Complexity: 1 Mod     PT G Codes:        Aida RaiderWendy Mayra Brahm, PT  Office # [phone] Pager [phone]   Ilda FoilGarrow, Adisson Deak Rene 08/27/2017, 11:11 AM

## 2017-08-27 NOTE — Evaluation (Signed)
Occupational Therapy Evaluation Patient Details Name: [name] [name] MRN: [id number] DOB: 08-Apr-1927 Today's Date: 08/27/2017    History of Present Illness Pt is an 84 y.o. female admitted from SNF with sepsis likely from UA with large LE. Pt also with recent fall resulting in R superior pubic ramus fracture. PMH significant for anemia, anxiety, CAD, chronic diastolic CHF, CKD, COPD, coronary artery disease, depression, dizziness, fatigue, GERD, hyperlipidemia, hypertension, memory disorder, seizure disorder, and recurrent syncope and collapse.    Clinical Impression   PTA, pt was at [facility] SNF and requiring assistance for ADL per chart. During OT evaluation, she was able to complete bed mobility with max assist, LB ADL with total assist, and UB ADL with min assist. Pt confused during evaluation and this is likely baseline cognition per chart. Planning on transfer to [facility] as pt expressing need to urinate; however, HR up to 150s in a-fib once seated at EOB and deferred further mobility. RN and nurse tech notified and planning to return Pure Whick although unsure if pt understands its purpose due to congitive deficits. Pt would benefit from continued OT services to address ADL and functional mobility s/p R superior pubic ramus fracture. Recommend D/C to SNF post-acute D/C with continued rehabilitation.    Follow Up Recommendations  SNF;Supervision/Assistance - 24 hour    Equipment Recommendations  Other (comment)(defer to next venue)    Recommendations for Other Services       Precautions / Restrictions Precautions Precautions: Fall Restrictions Weight Bearing Restrictions: Yes LLE Weight Bearing: Weight bearing as tolerated      Mobility Bed Mobility Overal bed mobility: Needs Assistance Bed Mobility: Supine to Sit;Sit to Supine     Supine to sit: Max assist Sit to supine: Max assist   General bed mobility comments: Max assist to manage B LE and raise trunk from [facility].    Transfers                 General transfer comment: Deferred due to heart rate elevated.     Balance Overall balance assessment: Needs assistance Sitting-balance support: No upper extremity supported;Feet supported Sitting balance-Leahy Scale: Fair Sitting balance - Comments: Able to statically sit with min guard assist.                                    ADL either performed or assessed with clinical judgement   ADL Overall ADL's : Needs assistance/impaired Eating/Feeding: Minimal assistance;Sitting   Grooming: Minimal assistance;Sitting   Upper Body Bathing: Minimal assistance;Sitting   Lower Body Bathing: Total assistance   Upper Body Dressing : Minimal assistance;Sitting   Lower Body Dressing: Total assistance                 General ADL Comments: Pt able to sit at EOB with min guard assist this session for simulated ADL. HR up to 150s in a-fib and felt unsafe to progress with mobility as such. Returned pt to supine and noted HR down to 115 in a-fib. Notified RN and requested nurse tech replace Pure Whick.       Vision   Additional Comments: Difficult to assess vision secondary to cognitive deficits. Able to make eye contact and read therapist's badge     Perception     Praxis      Pertinent Vitals/Pain Pain Assessment: Faces Faces Pain Scale: Hurts little more Pain Location: Pelvis Pain Descriptors / Indicators: Grimacing;Guarding Pain  Intervention(s): Limited activity within patient's tolerance;Monitored during session;Repositioned     Hand Dominance Right   Extremity/Trunk Assessment Upper Extremity Assessment Upper Extremity Assessment: Generalized weakness   Lower Extremity Assessment Lower Extremity Assessment: Defer to PT evaluation       Communication Communication Communication: HOH   Cognition Arousal/Alertness: Awake/alert Behavior During Therapy: Restless Overall Cognitive Status: History of cognitive  impairments - at baseline                                 General Comments: Oriented to self only and this is baseline per chart. Pt with difficulty understanding use of Pure Whick understandably with underlying dementia. Attempted to have pt utilize [facility] as this is more familiar but pt unable to progress due to medical issues.    General Comments  See general ADL comments for vitals    Exercises     Shoulder Instructions      Home Living Family/patient expects to be discharged to:: Skilled nursing facility                                 Additional Comments: Pt has been at [facility] SNF PTA      Prior Functioning/Environment Level of Independence: Needs assistance        Comments: Difficult to ascertain as pt with cognitive deficits at baseline. On previous admission it was noted that pt is in a wheelchair typically.         OT Problem List: Decreased strength;Decreased activity tolerance;Impaired balance (sitting and/or standing);Decreased safety awareness;Decreased knowledge of use of DME or AE;Decreased knowledge of precautions;Pain      OT Treatment/Interventions: Self-care/ADL training;Therapeutic exercise;Energy conservation;DME and/or AE instruction;Therapeutic activities;Patient/family education;Balance training    OT Goals(Current goals can be found in the care plan section) Acute Rehab OT Goals Patient Stated Goal: to get up to the bathroom OT Goal Formulation: Patient unable to participate in goal setting Time For Goal Achievement: 09/10/17 Potential to Achieve Goals: Good ADL Goals Pt Will Perform Grooming: with set-up;sitting Pt Will Transfer to Toilet: with min assist;stand pivot transfer;bedside commode Pt Will Perform Toileting - Clothing Manipulation and hygiene: with min assist;sit to/from stand Additional ADL Goal #1: Pt will complete bed mobility in preparation for ADL seated at EOB with overall min assist.  OT Frequency:  Min 1X/week   Barriers to D/C:            Co-evaluation              AM-PAC PT "6 Clicks" Daily Activity     Outcome Measure Help from another person eating meals?: A Little Help from another person taking care of personal grooming?: A Little Help from another person toileting, which includes using toliet, bedpan, or urinal?: Total Help from another person bathing (including washing, rinsing, drying)?: A Lot Help from another person to put on and taking off regular upper body clothing?: A Little Help from another person to put on and taking off regular lower body clothing?: Total 6 Click Score: 13   End of Session Equipment Utilized During Treatment: Oxygen Nurse Communication: Mobility status;Other (comment)(medical issues)  Activity Tolerance: Treatment limited secondary to medical complications (Comment)(HR up to 150s in a-fib with minimal activity) Patient left: in bed;with call bell/phone within reach;with bed alarm set  OT Visit Diagnosis: Repeated falls (R29.6);Other symptoms and signs involving cognitive function  Time: [id number] OT Time Calculation (min): 16 min Charges:  OT General Charges $OT Visit: 1 Visit OT Evaluation $OT Eval Moderate Complexity: 1 Mod G-Codes:     Doristine Sectionharity A Rmoni Keplinger, MS OTR/L  Pager: [phone]   Fitzgerald Dunne A Ruth Kovich 08/27/2017, 8:43 AM

## 2017-08-27 NOTE — Progress Notes (Signed)
Pt vomited about 10 min after receiving HS meds.  Visualized lyrica capsule in emesis but no other pills seen. SL zofran given. MD notified. No new orders at this time.

## 2017-08-28 DIAGNOSIS — N3000 Acute cystitis without hematuria: Secondary | ICD-10-CM

## 2017-08-28 DIAGNOSIS — F0280 Dementia in other diseases classified elsewhere without behavioral disturbance: Secondary | ICD-10-CM

## 2017-08-28 DIAGNOSIS — Z515 Encounter for palliative care: Secondary | ICD-10-CM

## 2017-08-28 DIAGNOSIS — I4891 Unspecified atrial fibrillation: Secondary | ICD-10-CM

## 2017-08-28 LAB — COMPREHENSIVE METABOLIC PANEL
ALBUMIN: 2.9 g/dL — AB (ref 3.5–5.0)
ALK PHOS: 44 U/L (ref 38–126)
ALT: 10 U/L — AB (ref 14–54)
ANION GAP: 7 (ref 5–15)
AST: 16 U/L (ref 15–41)
BUN: 18 mg/dL (ref 6–20)
CALCIUM: 8.8 mg/dL — AB (ref 8.9–10.3)
CO2: 28 mmol/L (ref 22–32)
Chloride: 102 mmol/L (ref 101–111)
Creatinine, Ser: 0.99 mg/dL (ref 0.44–1.00)
GFR calc Af Amer: 57 mL/min — ABNORMAL LOW (ref >60)
GFR calc non Af Amer: 49 mL/min — ABNORMAL LOW (ref >60)
GLUCOSE: 134 mg/dL — AB (ref 65–99)
Potassium: 3.6 mmol/L (ref 3.5–5.1)
SODIUM: 137 mmol/L (ref 135–145)
Total Bilirubin: 0.8 mg/dL (ref 0.3–1.2)
Total Protein: 5.8 g/dL — ABNORMAL LOW (ref 6.5–8.1)

## 2017-08-28 LAB — CBC WITH DIFFERENTIAL/PLATELET
BASOS PCT: 0 %
Basophils Absolute: 0 10*3/uL (ref 0.0–0.1)
EOS ABS: 0.1 10*3/uL (ref 0.0–0.7)
Eosinophils Relative: 1 %
HCT: 33.2 % — ABNORMAL LOW (ref 36.0–46.0)
HEMOGLOBIN: 10.7 g/dL — AB (ref 12.0–15.0)
Lymphocytes Relative: 21 %
Lymphs Abs: 2.3 10*3/uL (ref 0.7–4.0)
MCH: 30.1 pg (ref 26.0–34.0)
MCHC: 32.2 g/dL (ref 30.0–36.0)
MCV: 93.5 fL (ref 78.0–100.0)
Monocytes Absolute: 1.1 10*3/uL — ABNORMAL HIGH (ref 0.1–1.0)
Monocytes Relative: 11 %
NEUTROS PCT: 67 %
Neutro Abs: 7.4 10*3/uL (ref 1.7–7.7)
Platelets: 250 10*3/uL (ref 150–400)
RBC: 3.55 MIL/uL — AB (ref 3.87–5.11)
RDW: 12.7 % (ref 11.5–15.5)
WBC: 10.8 10*3/uL — AB (ref 4.0–10.5)

## 2017-08-28 MED ORDER — DILTIAZEM HCL ER COATED BEADS 120 MG PO CP24
120.0000 mg | ORAL_CAPSULE | Freq: Every day | ORAL | Status: DC
  Administered 2017-08-28 – 2017-08-29 (×2): 120 mg via ORAL
  Filled 2017-08-28 (×2): qty 1

## 2017-08-28 MED ORDER — CEPHALEXIN 250 MG PO CAPS
250.0000 mg | ORAL_CAPSULE | Freq: Four times a day (QID) | ORAL | Status: DC
  Administered 2017-08-28 – 2017-08-29 (×5): 250 mg via ORAL
  Filled 2017-08-28 (×6): qty 1

## 2017-08-28 MED ORDER — IPRATROPIUM-ALBUTEROL 0.5-2.5 (3) MG/3ML IN SOLN: 3.0000 mL | RESPIRATORY_TRACT | Status: DC | PRN

## 2017-08-28 NOTE — Progress Notes (Signed)
Family Medicine Teaching Service Daily Progress Note Intern Pager: [phone]  Patient name: [name] [name] Medical record number: [id number] Date of birth: Dec 26, 1926 Age: 84 y.o. Gender: female  Primary Care Provider: Chesley Noon, MD Consultants: None Code Status: Full  Pt Overview and Major Events to Date:  01/05: Admit for urosepsis 01/06: Continues to be in A. fib with RVR, stable, increased Cardizem 1/7 switched from vanc/zosyn to keflex  Assessment and Plan: [name] [name] is a 84 y.o. female presenting due to "turning blue" and unresponsiveness at her snf. Patient found to have large LE on UA. Given her septic picture this is the presumed source.  Urosepsis: -resolving Met SIRS criteria with fever, tachycardia, hypotension on admission. Had a UA with large LE. Presumed source of infection and being treated for urosepsis. Urine culture negative and blood cultures no growth at 24 hours. Chest xray clear with no respiratory complaints. Thorough check revealed no skin trauma that could have caused these symptoms. Given negative workup elsewhere still treating for urosepsis. Given no growth will presume that patient has a common bacterial source. Will stop vanc and zosyn and restart keflex to complete a 10 day course. Patient remains afebrile, and has stable vitals. AOx2 today which is improved from admission. Will plan to watch for 24 more hours on po abx and plan to d/c back to snf on 1/8. Has very borderline renal function in terms of keflex dosing. 544m qid for now but will adjust if renal function worsens slightly for bmp on 1/8. - vital signs per floor routine - continuous pulse ox, supplemental O2 as needed - cardiac monitoring - d/c vanc and zosyn (1/5-1/7) - keflex qid 5086m(1/8 -) - Decrease NS to 75 mL/hr - daily weights, strict I/O - continue to follow blood cx - trend fever curve and wbc  Atrial fibrillation with RVR: Rate controlled. Doing well since increase in  Caredizem on 1/6. Will change to cardizem 12038mD. Can likely discharge on this dose if patient does well. Patient without chest pain or shortness of breath.  Takes Cardizem 30 mg twice daily at home.  Cardiologist Dr. BhaCurly ShoresPresented in RVR on admission. - Telemetry - Cardizem 120m64m - Continue home Plavix, aspirin, Lipitor - Holding home Imdur  Right superior pubic ramus fracture: New finding.  Per Dr. due to from orthopedics, recommendations for progressive weightbearing as tolerated on hip due to nonoperable state. - PT/OT recommendations - Plan to return to SNF on discharge  Coronary artery disease  HFpEF: Chronic.  Mild narrowing of the mid proximal RCA and EF 60-65% per cath 2004.  Home Plavix, aspirin, Lipitor. - Telemetry - See plan above for atrial fibrillation  COPD: Chronic.  Does not presents with exacerbation. - Monitoring respiratory status  Seizure disorder: Chronic.  On Keppra at home. - Continue home Keppra  Chronic pain: Chronic.  On Lyrica at home. -Continue home Lyrica  Dementia: Oriented only to self, baseline per nursing home. - Monitoring  Depression: Chronic.  Takes Lexapro for unknown behavioral problem. - Continue home Lyrica  FEN/GI: Dysphagia to, thin liquids, no straws, crush meds in pureed foods Reflexes: Heparin SQ  Disposition: Pending improvement of urosepsis and stabilization of atrial fibrillation with RVR  Subjective:  Patient comfortable without complaints.  Denies chest pain or shortness of breath.  Oriented x2. Demented so history unreliable.  Objective: Temp:  [97.7 F (36.5 C)-99.3 F (37.4 C)] 97.7 F (36.5 C) (01/07 0420) Pulse Rate:  [84-104] 104 (  01/07 1019) Resp:  [16-22] 18 (01/07 1019) BP: (131-163)/(70-104) 133/85 (01/07 0741) SpO2:  [90 %-100 %] 100 % (01/07 1019) Physical Exam: General: elderly frail female, NAD with non-toxic appearance HEENT: normocephalic, atraumatic, moist mucous membranes Neck: supple,  non-tender without lymphadenopathy Cardiovascular: rrr, no m/r/g Lungs: CTAB, symmetric chest rise Abdomen: soft, non-tender, non-distended, normoactive bowel sounds Skin: warm, dry, no rashes or lesions, cap refill < 2 seconds Extremities: warm and well perfused, normal tone, no edema Psych: pleasantly demented, euthymic mood, flat affect  Laboratory: Recent Labs  Lab 08/26/17 0909 08/27/17 0224 08/28/17 0221  WBC 19.0* 13.1* 10.8*  HGB 12.5 10.3* 10.7*  HCT 37.7 32.4* 33.2*  PLT 274 245 250   Recent Labs  Lab 08/26/17 0909 08/27/17 0224 08/28/17 0221  NA 132* 133* 137  K 4.0 3.9 3.6  CL 96* 102 102  CO2 _0 BUN 30* 24* 18  CREATININE 1.44* 1.17* 0.99  CALCIUM 9.5 8.9 8.8*  PROT 6.7  --  5.8*  BILITOT 0.9  --  0.8  ALKPHOS 54  --  44  ALT 12*  --  10*  AST 21  --  16  GLUCOSE 143* 129* 134*   UA: Negative Urine culture (1/5): Negative Blood culture x2 (1/5): Pending Lactic acid: 2.11>1.33>1.4 Influenza PCR: Negative  Imaging/Diagnostic Tests: DG HIP UNILATERAL W PELVIS 2-3 VIEWS RIGHT (08/26/17):  Nondisplaced fracture involving the left superior pubic ramus. Right hip arthroplasty, without evidence of complication.  DG CHEST PORT 1 VIEW (08/26/17):  Cardiomegaly without evidence of acute cardiopulmonary disease.    Guadalupe Dawn, MD 08/28/2017, 12:16 PM PGY-1, [place] Intern pager: [phone], text pages welcome

## 2017-08-28 NOTE — Progress Notes (Signed)
   08/28/17 1100  Clinical Encounter Type  Visited With Patient  Visit Type Initial  Referral From Nurse  Consult/Referral To Chaplain  Spiritual Encounters  Spiritual Needs Emotional  Stress Factors  Patient Stress Factors Exhausted;Health changes  Family Stress Factors None identified  Advance Directives (For Healthcare)  Does Patient Have a Medical Advance Directive? No  Mental Health Advance Directives  Does Patient Have a Mental Health Advance Directive? No    Patient was in her bed seemingly very weak and calming down from throwing up. Charge nurse confirmed that patient was not a good candidate for making good decision about Power of attorney document due to her mental status at the time. No family on-site. I provided emotional support by calm  And compassionate presence.  [name] [name] a Water quality scientistMusiko-Holley, E. I. du PontChaplain

## 2017-08-28 NOTE — Consult Note (Signed)
Consultation Note Date: 08/28/2017   Patient Name: [name] [name]  DOB: 1927-02-07  MRN: [id number]  Age / Sex: 84 y.o., female  PCP: [name], MD Referring Physician: Moses Manners, MD  Reason for Consultation: Establishing goals of care  HPI/Patient Profile: 84 y.o. female  with past medical history of seizure, CAD, COPD, CKD stage 3, anxiety, dementia, and D-CHF  who was admitted on 08/26/2017 with sepsis secondary to UTI.  She also had afib with RVR.  She has improved significantly since admission on antibiotics and a cardizem gtt.  She had a recent fall with pubic ramus fracture.  Clinical Assessment and Goals of Care:  I have reviewed medical records including EPIC notes, labs and imaging, received report from the care team, assessed the patient and attempted to call her family members  to discuss diagnosis prognosis, GOC, EOL wishes, disposition and options.  [name] [name] is pleasantly demented.  She denied pain and told me she lived at [facility].  When I asked repeatedly who from her family she calls when she needs help - she replied "yes".  As far as functional and nutritional status.  She vomited this morning and per the RN has not eaten much today.  During yesterday's PT evaluation she was very lethargic and a "total assist" for sitting up or lying down.  She may be more alert today.  OT evaluation states she requires assistance with ADLs and that her heart rate went to the 150s with sitting on EOB.  Code status is listed as a full code.  Primary Decision Maker:  Uncertain.  The patient is demented and thus far her face sheet contacts have not be reachable.    SUMMARY OF RECOMMENDATIONS    If she discharges tomorrow before her family calls me back - please write "Palliative to follow at SNF" in the discharge summary instructions.  I will continue to attempt to reach her family in order  to discern who her health care proxy is and what her goals of care may be.  Code Status/Advance Care Planning: Listed as full code.  DNR recommended.   Prognosis: likely less than 1 year secondary to being bedbound, recent falls, having moderate to advanced dementia  Discharge Planning: Skilled Nursing Facility for rehab with Palliative care service follow-up      Primary Diagnoses: Present on Admission: . Urinary tract infection   I have reviewed the medical record, interviewed the patient and family, and examined the patient. The following aspects are pertinent.  Past Medical History:  Diagnosis Date  . Anemia    a. Chronic iron deficiency anemia  . Anxiety   . CAD (coronary artery disease)    a. Cath 2004: 70% ostial LAD, 50-60% prox ramus disease, treated medically. b. Nuc 12/2009 - EF 74%, normal study.  . Chronic diastolic CHF (congestive heart failure) (HCC)    a. Echo 02/2014: mild focal basal hypertrophy of septum, Normal LV function EF 55-60%; grade 1 diastolic dysfunction; mildly elevated PASP.  Marland Kitchen CKD (  chronic kidney disease), stage III (HCC)   . COPD (chronic obstructive pulmonary disease) (HCC)   . Coronary artery disease   . Depression   . Dizziness   . Fatigue   . GERD (gastroesophageal reflux disease)   . Hyperlipidemia   . Hypertension   . Memory disorder   . Seizure disorder (HCC)   . Syncope and collapse    RECURRENT, RELATED TO HYPONATREMIA, NARCOTICS, AND BRADYCARDIA  . Tobacco abuse    PAST   Social History   Socioeconomic History  . Marital status: Widowed    Spouse name: None  . Number of children: None  . Years of education: None  . Highest education level: None  Social Needs  . Financial resource strain: None  . Food insecurity - worry: None  . Food insecurity - inability: None  . Transportation needs - medical: None  . Transportation needs - non-medical: None  Occupational History  . None  Tobacco Use  . Smoking status: Former  Smoker    Packs/day: 1.00    Years: 40.00    Pack years: 40.00    Last attempt to quit: 08/23/1999    Years since quitting: 18.0  . Smokeless tobacco: Never Used  Substance and Sexual Activity  . Alcohol use: No  . Drug use: No  . Sexual activity: None  Other Topics Concern  . 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 Heart attack Father   . CAD Father   . Heart failure Mother   . Congestive Heart Failure Mother   . Other Brother        cerebral hemmorhage  . Coronary artery disease Sister    Scheduled Meds: . aspirin  325 mg Oral Daily  . atorvastatin  10 mg Oral q1800  . cephALEXin  250 mg Oral Q6H  . clopidogrel  75 mg Oral Daily  . diltiazem  120 mg Oral Daily  . docusate sodium  100 mg Oral BID  . enoxaparin (LOVENOX) injection  30 mg Subcutaneous Q24H  . escitalopram  5 mg Oral Daily  . feeding supplement (ENSURE ENLIVE)  237 mL Oral BID BM  . ferrous sulfate  325 mg Oral Q breakfast  . levETIRAcetam  750 mg Oral Q12H  . mouth rinse  15 mL Mouth Rinse BID  . pregabalin  75 mg Oral QHS  . tiotropium  18 mcg Inhalation Daily   Continuous Infusions: . sodium chloride 75 mL/hr at 08/28/17 1236   PRN Meds:.acetaminophen **OR** acetaminophen, ipratropium-albuterol, ondansetron Allergies  Allergen Reactions  . Codeine Nausea And Vomiting   Review of Systems patient is demented.  Physical Exam  Thin frail female, awake, alert, pleasantly confused. CV rrr resp no distress Abdomen protuberant, firm, non tender Extremities no edema.  Vital Signs: BP (!) 160/79   Pulse 86   Temp 97.7 F (36.5 C) (Oral)   Resp (!) 22   Ht 5' (1.524 m)   Wt 54.5 kg (120 lb 2.4 oz)   SpO2 90%   BMI 23.47 kg/m  Pain Assessment: No/denies pain   Pain Score: 0-No pain   SpO2: SpO2: 90 % O2 Device:SpO2: 90 % O2 Flow Rate: .O2 Flow Rate (L/min): 2 L/min  IO: Intake/output summary:    Intake/Output Summary (Last 24 hours) at 08/28/2017 1516 Last data filed at 08/28/2017 1253 Gross per 24 hour  Intake 1940 ml  Output 1050 ml  Net 890 ml    LBM: Last BM Date: (PTA Pt does not recall) Baseline Weight: Weight: 52.2 kg (115 lb) Most recent weight: Weight: 54.5 kg (120 lb 2.4 oz)     Palliative Assessment/Data: 20%     Time In: 3:00 Time Out: 3:50 Time Total: 50 min Greater than 50%  of this time was spent counseling and coordinating care related to the above assessment and plan.  Signed by: Norvel Richards, PA-C Palliative Medicine Pager: [phone]  Please contact Palliative Medicine Team phone at [phone] for questions and concerns.  For individual provider: See Loretha Stapler

## 2017-08-28 NOTE — [place] FL2 (Signed)
[place] MEDICA[place] FL2 LEVEL OF CARE SCREENING TOOL     IDENTIFICATION  Patient Name: [name] [name] Birthdate: September 06, 1926 Sex: female Admission Date (Current Location): 08/26/2017  [facility] [place]   Facility and Address:  [facility], [address], [place], [place] [address]      Provider Number: [id number]  Attending Physician Name and Address:  Moses MannersHensel, William A, MD  Relative Name and Phone Number:  Denny LevyYollette, sister, [phone]    Current Level of Care: Hospital Recommended Level of Care: Skilled Nursing Facility Prior Approval Number:    Date Approved/Denied:   PASRR Number: [id number]  Discharge Plan: SNF    Current Diagnoses: Patient Active Problem List   Diagnosis Date Noted  . Sepsis (HCC)   . AKI (acute kidney injury) (HCC)   . Mild protein-calorie malnutrition (HCC)   . Dementia without behavioral disturbance   . Atrial fibrillation with RVR (HCC)   . Urinary tract infection 08/26/2017  . Dehydration 08/12/2016  . SIRS (systemic inflammatory response syndrome) (HCC) 08/12/2016  . Malnutrition of moderate degree (HCC) 01/19/2015  . Femur fracture (HCC) 01/18/2015  . Recurrent falls 01/18/2015  . Acute renal failure (HCC) 01/18/2015  . Dehydration with hyponatremia 01/18/2015  . FTT (failure to thrive) in adult 01/18/2015  . Severe protein-calorie malnutrition (HCC) 01/18/2015  . Hypokalemia 01/18/2015  . Periprosthetic fracture around internal prosthetic joint 01/18/2015  . Closed femur fracture (HCC)   . Fracture of femoral neck, right (HCC) 12/05/2014  . CKD (chronic kidney disease) stage 3, GFR 30-59 ml/min (HCC) 12/05/2014  . Diastolic CHF, chronic (HCC) 11/17/2011  . GERD (gastroesophageal reflux disease) 09/19/2011  . Orthostatic hypotension 05/11/2011  . Anxiety   . Seizure disorder (HCC)   . Hypertension   . Hyperlipidemia   . Coronary artery disease   . Anemia   . Tobacco abuse   . Memory  disorder     Orientation RESPIRATION BLADDER Height & Weight     Self  O2(Nasal cannula 2L) Continent, External catheter Weight: 54.5 kg (120 lb 2.4 oz) Height:  5' (152.4 cm)  BEHAVIORAL SYMPTOMS/MOOD NEUROLOGICAL BOWEL NUTRITION STATUS      Continent Diet(Please see DC Summary)  AMBULATORY STATUS COMMUNICATION OF NEEDS Skin   Extensive Assist Verbally Normal                       Personal Care Assistance Level of Assistance  Bathing, Feeding, Dressing Bathing Assistance: Maximum assistance Feeding assistance: Limited assistance Dressing Assistance: Limited assistance     Functional Limitations Info  Hearing   Hearing Info: Impaired      SPECIAL CARE FACTORS FREQUENCY  PT (By licensed PT)     PT Frequency: 5x/week              Contractures      Additional Factors Info  Code Status, Allergies Code Status Info: Full Allergies Info: Codeine           Current Medications (08/28/2017):  This is the current hospital active medication list Current Facility-Administered Medications  Medication Dose Route Frequency Provider Last Rate Last Dose  . 0.9 %  sodium chloride infusion   Intravenous Continuous Myrene BuddyFletcher, Jacob, MD 125 mL/hr at 08/27/17 0045    . acetaminophen (TYLENOL) tablet 650 mg  650 mg Oral Q6H PRN Myrene BuddyFletcher, Jacob, MD       Or  . acetaminophen (TYLENOL) suppository 650 mg  650 mg Rectal Q6H PRN  Myrene Buddy, MD      . albuterol (PROVENTIL) (2.5 MG/3ML) 0.083% nebulizer solution 2.5 mg  2.5 mg Nebulization Q4H PRN Moses Manners, MD      . aspirin tablet 325 mg  325 mg Oral Daily Myrene Buddy, MD   325 mg at 08/28/17 0854  . atorvastatin (LIPITOR) tablet 10 mg  10 mg Oral q1800 Berton Bon, MD   10 mg at 08/27/17 1711  . clopidogrel (PLAVIX) tablet 75 mg  75 mg Oral Daily Myrene Buddy, MD   75 mg at 08/28/17 0853  . diltiazem (CARDIZEM CD) 24 hr capsule 120 mg  120 mg Oral Daily Wendee Beavers, DO   120 mg at 08/28/17 0915  .  docusate sodium (COLACE) capsule 100 mg  100 mg Oral BID Myrene Buddy, MD   100 mg at 08/28/17 0853  . enoxaparin (LOVENOX) injection 30 mg  30 mg Subcutaneous Q24H Myrene Buddy, MD   30 mg at 08/27/17 2054  . escitalopram (LEXAPRO) tablet 5 mg  5 mg Oral Daily Myrene Buddy, MD   5 mg at 08/28/17 0853  . feeding supplement (ENSURE ENLIVE) (ENSURE ENLIVE) liquid 237 mL  237 mL Oral BID BM Myrene Buddy, MD   237 mL at 08/28/17 0854  . ferrous sulfate tablet 325 mg  325 mg Oral Q breakfast Myrene Buddy, MD   325 mg at 08/28/17 0853  . ipratropium (ATROVENT) nebulizer solution 0.5 mg  0.5 mg Nebulization Q4H PRN Moses Manners, MD      . levETIRAcetam (KEPPRA) tablet 750 mg  750 mg Oral Q12H Myrene Buddy, MD   750 mg at 08/28/17 0853  . MEDLINE mouth rinse  15 mL Mouth Rinse BID Moses Manners, MD   15 mL at 08/28/17 0854  . ondansetron (ZOFRAN-ODT) disintegrating tablet 4 mg  4 mg Oral Q8H PRN Myrene Buddy, MD   4 mg at 08/28/17 0915  . piperacillin-tazobactam (ZOSYN) IVPB 3.375 g  3.375 g Intravenous Q8H Armandina Stammer, RPH 12.5 mL/hr at 08/28/17 0853 3.375 g at 08/28/17 0853  . pregabalin (LYRICA) capsule 75 mg  75 mg Oral QHS Myrene Buddy, MD   75 mg at 08/27/17 2054  . tiotropium (SPIRIVA) inhalation capsule 18 mcg  18 mcg Inhalation Daily Moses Manners, MD   18 mcg at 08/27/17 0849  . vancomycin (VANCOCIN) 500 mg in sodium chloride 0.9 % 100 mL IVPB  500 mg Intravenous Q24H Armandina Stammer, [facility]   Stopped at 08/27/17 1514     Discharge Medications: Please see discharge summary for a list of discharge medications.  Relevant Imaging Results:  Relevant Lab Results:   Additional Information SSN: [id number]  Mearl Latin, LCSWA

## 2017-08-28 NOTE — Progress Notes (Signed)
Initial Nutrition Assessment  DOCUMENTATION CODES:   Severe malnutrition in context of chronic illness  INTERVENTION:  1. Ensure Enlive po BID, each supplement provides 350 kcal and 20 grams of protein  2. Magic cup TID with meals, each supplement provides 290 kcal and 9 grams of protein  NUTRITION DIAGNOSIS:   Severe Malnutrition related to chronic illness as evidenced by severe muscle depletion, severe fat depletion.  GOAL:   Patient will meet greater than or equal to 90% of their needs  MONITOR:   PO intake, I & O's, Labs, Supplement acceptance, Weight trends  REASON FOR ASSESSMENT:   Consult Assessment of nutrition requirement/status  ASSESSMENT:   Ms. Joseph ArtWoods is an 84 yo female with PMH Dementia, HTN, HLD, CAD, CHF, CKD 3, FTT, presents with UTI, Sepsis, AKI, A-fib w/ RVR   Spoke with patient at bedside. She is demented, but does provide some history. She states she doesn't eat breakfast normally, eats a light lunch, eats a good dinner. She does not provide more history. States her weight is stable. Per chart, appears stable between 115-120 pounds over the past year. Had some nausea during visit and gagging. Unsure if she ate breakfast this morning.  Labs reviewed Medications reviewed and include:  Colace, Iron NS at 2675mL/hr    NUTRITION - FOCUSED PHYSICAL EXAM:    Most Recent Value  Orbital Region  Severe depletion  Upper Arm Region  Severe depletion  Thoracic and Lumbar Region  Severe depletion  Buccal Region  Severe depletion  Temple Region  Severe depletion  Clavicle Bone Region  Severe depletion  Clavicle and Acromion Bone Region  Severe depletion  Scapular Bone Region  Severe depletion  Dorsal Hand  Severe depletion  Patellar Region  Severe depletion  Anterior Thigh Region  Severe depletion  Posterior Calf Region  Severe depletion  Edema (RD Assessment)  None  Hair  Reviewed  Eyes  Reviewed  Mouth  Reviewed  Skin  Reviewed  Nails  Reviewed        Diet Order:  DIET DYS 2 Room service appropriate? Yes; Fluid consistency: Thin  EDUCATION NEEDS:   Not appropriate for education at this time  Skin:  Skin Assessment: Skin Integrity Issues: Skin Integrity Issues:: Other (Comment) Other: Ecchymosis to L Leg and Hip, to R hand  Last BM:  PTA  Height:   Ht Readings from Last 1 Encounters:  08/26/17 5' (1.524 m)    Weight:   Wt Readings from Last 1 Encounters:  08/26/17 120 lb 2.4 oz (54.5 kg)    Ideal Body Weight:  45.45 kg  BMI:  Body mass index is 23.47 kg/m.  Estimated Nutritional Needs:   Kcal:  1200-1350 calories (MSJ x1.3-1.5)  Protein:  81-93 grams (1.5-1.7g/kg)  Fluid:  >/=1.2L  Dionne AnoWilliam M. Sherrian Nunnelley, MS, RD LDN Inpatient Clinical Dietitian Pager [phone]

## 2017-08-29 DIAGNOSIS — R6510 Systemic inflammatory response syndrome (SIRS) of non-infectious origin without acute organ dysfunction: Secondary | ICD-10-CM

## 2017-08-29 DIAGNOSIS — A419 Sepsis, unspecified organism: Principal | ICD-10-CM

## 2017-08-29 DIAGNOSIS — Z66 Do not resuscitate: Secondary | ICD-10-CM

## 2017-08-29 DIAGNOSIS — Z7189 Other specified counseling: Secondary | ICD-10-CM

## 2017-08-29 LAB — BASIC METABOLIC PANEL
Anion gap: 9 (ref 5–15)
BUN: 20 mg/dL (ref 6–20)
CALCIUM: 9.1 mg/dL (ref 8.9–10.3)
CO2: 26 mmol/L (ref 22–32)
CREATININE: 0.86 mg/dL (ref 0.44–1.00)
Chloride: 102 mmol/L (ref 101–111)
GFR calc non Af Amer: 59 mL/min — ABNORMAL LOW (ref >60)
Glucose, Bld: 136 mg/dL — ABNORMAL HIGH (ref 65–99)
Potassium: 3.3 mmol/L — ABNORMAL LOW (ref 3.5–5.1)
SODIUM: 137 mmol/L (ref 135–145)

## 2017-08-29 LAB — CBC WITH DIFFERENTIAL/PLATELET
BASOS PCT: 0 %
Basophils Absolute: 0 10*3/uL (ref 0.0–0.1)
EOS ABS: 0.1 10*3/uL (ref 0.0–0.7)
EOS PCT: 0 %
HCT: 30.9 % — ABNORMAL LOW (ref 36.0–46.0)
Hemoglobin: 9.9 g/dL — ABNORMAL LOW (ref 12.0–15.0)
Lymphocytes Relative: 19 %
Lymphs Abs: 2.5 10*3/uL (ref 0.7–4.0)
MCH: 29.9 pg (ref 26.0–34.0)
MCHC: 32 g/dL (ref 30.0–36.0)
MCV: 93.4 fL (ref 78.0–100.0)
MONO ABS: 2 10*3/uL — AB (ref 0.1–1.0)
Monocytes Relative: 15 %
Neutro Abs: 8.8 10*3/uL — ABNORMAL HIGH (ref 1.7–7.7)
Neutrophils Relative %: 66 %
PLATELETS: 269 10*3/uL (ref 150–400)
RBC: 3.31 MIL/uL — ABNORMAL LOW (ref 3.87–5.11)
RDW: 12.8 % (ref 11.5–15.5)
WBC: 13.4 10*3/uL — ABNORMAL HIGH (ref 4.0–10.5)

## 2017-08-29 MED ORDER — FUROSEMIDE 20 MG PO TABS
20.0000 mg | ORAL_TABLET | Freq: Every day | ORAL | Status: DC
  Administered 2017-08-29: 20 mg via ORAL
  Filled 2017-08-29: qty 1

## 2017-08-29 MED ORDER — CEPHALEXIN 250 MG PO CAPS: 250.0000 mg | ORAL_CAPSULE | Freq: Four times a day (QID) | ORAL | 0 refills | Status: AC

## 2017-08-29 MED ORDER — DILTIAZEM HCL ER COATED BEADS 120 MG PO CP24: 120.0000 mg | ORAL_CAPSULE | Freq: Every day | ORAL | 0 refills | Status: DC

## 2017-08-29 MED ORDER — SENNA 8.6 MG PO TABS: 2.0000 | ORAL_TABLET | Freq: Every day | ORAL | Status: DC

## 2017-08-29 MED ORDER — POTASSIUM CHLORIDE CRYS ER 20 MEQ PO TBCR
20.0000 meq | EXTENDED_RELEASE_TABLET | Freq: Once | ORAL | Status: AC
  Administered 2017-08-29: 20 meq via ORAL
  Filled 2017-08-29: qty 1

## 2017-08-29 NOTE — Clinical Social Work Note (Signed)
Clinical Social Work Assessment  Patient Details  Name: [name] [name] MRN: [id number] Date of Birth: [birth year]  Date of referral:  08/28/17               Reason for consult:  Facility Placement                Permission sought to share information with:  Facility Medical sales representativeContact Representative, Family Supports Permission granted to share information::  Yes, Verbal Permission Granted  Name::     [facility]  Agency::  [facility]  Relationship::  Sister  Contact Information:  [phone]  Housing/Transportation Living arrangements for the past 2 months:  Skilled Nursing Facility Source of Information:  Siblings Patient Interpreter Needed:  None Criminal Activity/Legal Involvement Pertinent to Current Situation/Hospitalization:  No - Comment as needed Significant Relationships:  Adult Children, Siblings Lives with:  Facility Resident Do you feel safe going back to the place where you live?  Yes Need for family participation in patient care:  Yes (Comment)  Care giving concerns:  CSW received consult for possible return to SNF placement at time of discharge. CSW spoke with patient's daughter-in-law, [name] [name]. [facility] at discharge. CSW to continue to follow and assist with discharge planning needs.   Social Worker assessment / plan:  CSW spoke with patient's daughter-in-law concerning return to SNF.   Employment status:  Retired Database administratornsurance information:  Managed Medicare PT Recommendations:  Skilled Nursing Facility Information / Referral to community resources:  Skilled Nursing Facility  Patient/Family's Response to care:  Patient's daughter-in-law reports understanding of discharge plan and will have patient's son to do admission paperwork. She has confirmed that patient will need Hospice at [facility]. Patient will also need PTAR transport.  Patient/Family's Understanding of and Emotional Response to Diagnosis, Current  Treatment, and Prognosis:  Patient/family is realistic regarding therapy needs and expressed being hopeful for SNF placement. Patient's daughter in law expressed understanding of CSW role and discharge process as well as medical condition. No questions/concerns about plan or treatment.    Emotional Assessment Appearance:  Appears stated age Attitude/Demeanor/Rapport:  Unable to Assess Affect (typically observed):  Unable to Assess Orientation:  Oriented to Self Alcohol / Substance use:  Not Applicable Psych involvement (Current and /or in the community):  No (Comment)  Discharge Needs  Concerns to be addressed:  Care Coordination Readmission within the last 30 days:  No Current discharge risk:  None Barriers to Discharge:  Continued Medical Work up   [employer]ia S Aeron Lheureux, LCSWA 08/29/2017, 10:50 AM

## 2017-08-29 NOTE — Progress Notes (Signed)
Patient will DC to: [facility] Anticipated DC date: 08/29/17 Family notified: Son and daughter in Development worker, communitylaw Transport by: [facility] HayPTAR 3pm   Per MD patient ready for DC to [facility]. RN, patient, patient's family, and facility notified of DC. Discharge Summary sent to facility. RN given number for report [phone]). DC packet on chart. Ambulance transport requested for patient.   CSW signing off.  Cristobal GoldmannNadia Helaina Stefano, [place] Clinical Social Worker [phone]

## 2017-08-29 NOTE — Progress Notes (Signed)
Family Medicine Teaching Service Daily Progress Note Intern Pager: [phone]  Patient name: [name] [name] Medical record number: [id number] Date of birth: Mar 03, 1927 Age: 84 y.o. Gender: female  Primary Care Provider: Eartha InchBadger, Michael C, MD Consultants: None Code Status: Full  Pt Overview and Major Events to Date:  01/05: Admit for urosepsis 01/06: Continues to be in A. fib with RVR, stable, increased Cardizem 1/7 switched from vanc/zosyn to keflex  Assessment and Plan: [name] [name] a 84 y.o. female presenting due to "turning blue" and unresponsiveness at her snf. Patient found to have large LE on UA. Given her septic picture this [name] the presumed source.  Urosepsis: Continues to improve. Alert and oriented x2 this am. Had a UA with large LE which [name] her presumed source of infection despite negative cultures. Switched to keflex 1/7. Afebrile overnight, wbc went from 10.8 to 13.4 which [name] slightly concerning given switch in abx. Doesn't appear to be hemoconcentration from 1/7. Blood cultures with no growth at 2 days. Patient appears stable to return to her snf today. From a physical exam and vital signs standpoint the patient appears ready for discharge. The only thing that will hold her up [name] the slight bump in wbc. Anticipate DC later today. - vital signs per floor routine - continuous pulse ox, supplemental O2 as needed - cardiac monitoring - keflex qid 500mg  (1/8 -) - saline lock iv - daily weights, strict I/O - continue to follow blood cx - trend fever curve and wbc  Atrial fibrillation with RVR: Rate controlled. Switched to cardizem 120mg  CD. Can likely discharge on this dose. Patient without chest pain or shortness of breath.  Takes Cardizem 30 mg twice daily at home.  Cardiologist Dr. Iver NestleBhagat.  Presented in RVR on admission. - Telemetry - Cardizem 120mg  CD - Continue home Plavix, aspirin, Lipitor - Holding home Imdur  Right superior pubic ramus fracture: New finding.  Per Dr.  due to from orthopedics, recommendations for progressive weightbearing as tolerated on hip due to nonoperable state. - PT/OT recommendations - Plan to return to SNF on discharge  Coronary artery disease  HFpEF: Chronic.  Mild narrowing of the mid proximal RCA and EF 60-65% per cath 2004.  Home Plavix, aspirin, Lipitor. - Telemetry - See plan above for atrial fibrillation  COPD: Chronic.  Does not presents with exacerbation. - Monitoring respiratory status  Seizure disorder: Chronic.  On Keppra at home. - Continue home Keppra  Chronic pain: Chronic.  On Lyrica at home. -Continue home Lyrica  Dementia: Oriented only to self, baseline per nursing home. - Monitoring  Depression: Chronic.  Takes Lexapro for unknown behavioral problem. - Continue home Lyrica  Hypokalemia 3.3 1/8. Will replete with 20meq kdur.  FEN/GI: Dysphagia to, thin liquids, no straws, crush meds in pureed foods Reflexes: Heparin SQ  Disposition: likely back to snf 1/8  Subjective:  Patient comfortable without complaints.  Denies chest pain or shortness of breath.  Oriented x2. Demented so history unreliable.  Objective: Temp:  [97.7 F (36.5 C)-98.9 F (37.2 C)] 98 F (36.7 C) (01/08 0400) Pulse Rate:  [48-107] 84 (01/08 0518) Resp:  [14-24] 20 (01/08 0518) BP: (124-175)/(55-99) 134/69 (01/08 0518) SpO2:  [75 %-100 %] 92 % (01/08 0518) Physical Exam: General: elderly frail female, in no acute distress, resting comfortably HEENT: normocephalic, atraumatic, mmm Neck: supple, non-tender without lymphadenopathy Cardiovascular: regular rate and rhythm, no m/r/g Lungs: CTAB, symmetric chest rise Abdomen: soft, non-tender, non-distended, normoactive bowel sounds Skin: warm, dry, no  rashes or lesions, cap refill < 2 seconds Extremities: warm and well perfused, normal tone, no edema Psych: pleasantly demented, euthymic mood, flat affect  Laboratory: Recent Labs  Lab 08/27/17 0224 08/28/17 0221  08/29/17 0229  WBC 13.1* 10.8* 13.4*  HGB 10.3* 10.7* 9.9*  HCT 32.4* 33.2* 30.9*  PLT 245 250 269   Recent Labs  Lab 08/26/17 0909 08/27/17 0224 08/28/17 0221 08/29/17 0820  NA 132* 133* 137 137  K 4.0 3.9 3.6 3.3*  CL 96* 102 102 102  CO2 26 26 28 26   BUN 30* 24* 18 20  CREATININE 1.44* 1.17* 0.99 0.86  CALCIUM 9.5 8.9 8.8* 9.1  PROT 6.7  --  5.8*  --   BILITOT 0.9  --  0.8  --   ALKPHOS 54  --  44  --   ALT 12*  --  10*  --   AST 21  --  16  --   GLUCOSE 143* 129* 134* 136*   Imaging/Diagnostic Tests: DG HIP UNILATERAL W PELVIS 2-3 VIEWS RIGHT (08/26/17):  Nondisplaced fracture involving the left superior pubic ramus. Right hip arthroplasty, without evidence of complication.  DG CHEST PORT 1 VIEW (08/26/17):  Cardiomegaly without evidence of acute cardiopulmonary disease.    Myrene Buddy, MD 08/29/2017, 9:37 AM PGY-1, [facility] Family Medicine FPTS Intern pager: [phone], text pages welcome

## 2017-08-29 NOTE — Progress Notes (Addendum)
I had an extended conversation with the patient's daughter in law, [name] [name].   [name] [name] explained that the patient's only living son [name] Hammers(Ray) and the patient's sister both have psychological issues and they sleep a great deal of the time. Consequently, [name] [name] is the primary point of contact for [name] [name].  [name] [name] told me that [name] [name] was a quick witted woman who loved people.  She worked at [employer] and raised her two sons as a single mother.  One of her sons is now deceased.    Over the last 6-8 months the patient has declined significantly.  She stopped walking and was wheelchair bound.  She no longer associates with the other residents of [facility].  She no longer reads or even watches TV in her room. Most of the time she sleeps.  [name] [name] commented that the last time she visited [name] [name] told her "you know I love you don't you? You know I really love you."  [name] [name] felt strongly that [name] [name] was trying to tell her goodbye.  She feels that [name] [name] knows her time is short.  We discussed code status.  [name] [name] felt that her mother in law should not be coded in the event of cardiac or respiratory arrest.  She also felt her mother in law hates the hospital and does not want to return to the hospital.  [facility].  [name] [name] requested that Hospice services start at discharge from the hospital.  Assessment:  84 yo demented female recovering from sepsis secondary to UTI. afib with RVR (difficult to control). Has new left  pelvic fracture.  Bed bound.  Minimal PO intake.    Prognosis:  Less than 6 months  Recommendations:    DNR - please complete a Renette ButtersGolden Form for discharge to the facility  Please continue Norco 1 tab q 4 hours PRN - given new pelvic fracture this will be important.  Please d/c with Senna 2 tabs qhs  Discontinue iron, calcium, vitamin D, aspirin, statin (minimize pill burden as much as possible)   Please ensure  patient is [facility].  Norvel RichardsMarianne Nyeema Want, PA-C Palliative Medicine Pager: [phone]  Time 45 min.

## 2017-08-29 NOTE — Progress Notes (Addendum)
Patient discharge teaching given, including activity, diet, follow-up appoints, and medications.   Patient verbalized understanding of all discharge instructions.  Copy provided for family to review.  IV access was d/c'd. Vitals are stable. Skin is intact except as charted in most recent assessments. Pt to be escorted out by [facility], to be transferred to [facility].

## 2017-08-31 LAB — CULTURE, BLOOD (ROUTINE X 2)
CULTURE: NO GROWTH
CULTURE: NO GROWTH
Special Requests: ADEQUATE
Special Requests: ADEQUATE

## 2017-11-08 ENCOUNTER — Other Ambulatory Visit: Payer: Self-pay

## 2017-11-08 ENCOUNTER — Emergency Department (HOSPITAL_COMMUNITY)
Admission: EM | Admit: 2017-11-08 | Discharge: 2017-11-09 | Disposition: A | Discharge status: Home / Self Care | Attending: Emergency Medicine | Admitting: Emergency Medicine

## 2017-11-08 ENCOUNTER — Emergency Department (HOSPITAL_COMMUNITY)

## 2017-11-08 DIAGNOSIS — N3001 Acute cystitis with hematuria: Secondary | ICD-10-CM | POA: Insufficient documentation

## 2017-11-08 DIAGNOSIS — I2510 Atherosclerotic heart disease of native coronary artery without angina pectoris: Secondary | ICD-10-CM | POA: Diagnosis not present

## 2017-11-08 DIAGNOSIS — J449 Chronic obstructive pulmonary disease, unspecified: Secondary | ICD-10-CM | POA: Diagnosis not present

## 2017-11-08 DIAGNOSIS — Z96641 Presence of right artificial hip joint: Secondary | ICD-10-CM | POA: Insufficient documentation

## 2017-11-08 DIAGNOSIS — F0390 Unspecified dementia without behavioral disturbance: Secondary | ICD-10-CM | POA: Insufficient documentation

## 2017-11-08 DIAGNOSIS — Z23 Encounter for immunization: Secondary | ICD-10-CM | POA: Insufficient documentation

## 2017-11-08 DIAGNOSIS — W06XXXA Fall from bed, initial encounter: Secondary | ICD-10-CM | POA: Insufficient documentation

## 2017-11-08 DIAGNOSIS — Z79899 Other long term (current) drug therapy: Secondary | ICD-10-CM | POA: Diagnosis not present

## 2017-11-08 DIAGNOSIS — N183 Chronic kidney disease, stage 3 (moderate): Secondary | ICD-10-CM | POA: Insufficient documentation

## 2017-11-08 DIAGNOSIS — Z87891 Personal history of nicotine dependence: Secondary | ICD-10-CM | POA: Diagnosis not present

## 2017-11-08 DIAGNOSIS — I5032 Chronic diastolic (congestive) heart failure: Secondary | ICD-10-CM | POA: Insufficient documentation

## 2017-11-08 DIAGNOSIS — Z7902 Long term (current) use of antithrombotics/antiplatelets: Secondary | ICD-10-CM | POA: Insufficient documentation

## 2017-11-08 DIAGNOSIS — Y9389 Activity, other specified: Secondary | ICD-10-CM | POA: Insufficient documentation

## 2017-11-08 DIAGNOSIS — Y92193 Bedroom in other specified residential institution as the place of occurrence of the external cause: Secondary | ICD-10-CM | POA: Insufficient documentation

## 2017-11-08 DIAGNOSIS — S0101XA Laceration without foreign body of scalp, initial encounter: Secondary | ICD-10-CM | POA: Insufficient documentation

## 2017-11-08 DIAGNOSIS — Y999 Unspecified external cause status: Secondary | ICD-10-CM | POA: Diagnosis not present

## 2017-11-08 DIAGNOSIS — I130 Hypertensive heart and chronic kidney disease with heart failure and stage 1 through stage 4 chronic kidney disease, or unspecified chronic kidney disease: Secondary | ICD-10-CM | POA: Insufficient documentation

## 2017-11-08 DIAGNOSIS — S0990XA Unspecified injury of head, initial encounter: Secondary | ICD-10-CM | POA: Diagnosis present

## 2017-11-08 DIAGNOSIS — W19XXXA Unspecified fall, initial encounter: Secondary | ICD-10-CM

## 2017-11-08 LAB — URINALYSIS, ROUTINE W REFLEX MICROSCOPIC
Bilirubin Urine: NEGATIVE
GLUCOSE, UA: NEGATIVE mg/dL
KETONES UR: NEGATIVE mg/dL
NITRITE: NEGATIVE
PH: 6 (ref 5.0–8.0)
Protein, ur: NEGATIVE mg/dL
SPECIFIC GRAVITY, URINE: 1.01 (ref 1.005–1.030)

## 2017-11-08 LAB — CBC WITH DIFFERENTIAL/PLATELET
BASOS PCT: 0 %
Basophils Absolute: 0 10*3/uL (ref 0.0–0.1)
EOS ABS: 0.2 10*3/uL (ref 0.0–0.7)
EOS PCT: 2 %
HCT: 24.6 % — ABNORMAL LOW (ref 36.0–46.0)
Hemoglobin: 7.9 g/dL — ABNORMAL LOW (ref 12.0–15.0)
Lymphocytes Relative: 33 %
Lymphs Abs: 3.1 10*3/uL (ref 0.7–4.0)
MCH: 27.1 pg (ref 26.0–34.0)
MCHC: 32.1 g/dL (ref 30.0–36.0)
MCV: 84.5 fL (ref 78.0–100.0)
MONOS PCT: 7 %
Monocytes Absolute: 0.6 10*3/uL (ref 0.1–1.0)
Neutro Abs: 5.5 10*3/uL (ref 1.7–7.7)
Neutrophils Relative %: 58 %
PLATELETS: 356 10*3/uL (ref 150–400)
RBC: 2.91 MIL/uL — ABNORMAL LOW (ref 3.87–5.11)
RDW: 16.6 % — ABNORMAL HIGH (ref 11.5–15.5)
WBC: 9.4 10*3/uL (ref 4.0–10.5)

## 2017-11-08 LAB — BASIC METABOLIC PANEL
Anion gap: 8 (ref 5–15)
BUN: 11 mg/dL (ref 6–20)
CALCIUM: 8.5 mg/dL — AB (ref 8.9–10.3)
CO2: 28 mmol/L (ref 22–32)
CREATININE: 1.26 mg/dL — AB (ref 0.44–1.00)
Chloride: 102 mmol/L (ref 101–111)
GFR, EST AFRICAN AMERICAN: 43 mL/min — AB (ref >60)
GFR, EST NON AFRICAN AMERICAN: 37 mL/min — AB (ref >60)
Glucose, Bld: 137 mg/dL — ABNORMAL HIGH (ref 65–99)
Potassium: 3.9 mmol/L (ref 3.5–5.1)
SODIUM: 138 mmol/L (ref 135–145)

## 2017-11-08 MED ORDER — SODIUM CHLORIDE 0.9 % IV SOLN
3.0000 g | Freq: Once | INTRAVENOUS | Status: DC
  Filled 2017-11-08: qty 3

## 2017-11-08 MED ORDER — LIDOCAINE HCL (PF) 1 % IJ SOLN
5.0000 mL | Freq: Once | INTRAMUSCULAR | Status: AC
  Administered 2017-11-08: 5 mL via INTRADERMAL
  Filled 2017-11-08: qty 5

## 2017-11-08 MED ORDER — SODIUM CHLORIDE 0.9 % IV SOLN
1.0000 g | Freq: Once | INTRAVENOUS | Status: AC
  Administered 2017-11-08: 1 g via INTRAVENOUS
  Filled 2017-11-08: qty 10

## 2017-11-08 MED ORDER — LIDOCAINE-EPINEPHRINE-TETRACAINE (LET) SOLUTION
3.0000 mL | Freq: Once | NASAL | Status: AC
  Administered 2017-11-08: 3 mL via TOPICAL
  Filled 2017-11-08: qty 3

## 2017-11-08 MED ORDER — TETANUS-DIPHTH-ACELL PERTUSSIS 5-2.5-18.5 LF-MCG/0.5 IM SUSP
0.5000 mL | Freq: Once | INTRAMUSCULAR | Status: AC
  Administered 2017-11-08: 0.5 mL via INTRAMUSCULAR
  Filled 2017-11-08: qty 0.5

## 2017-11-08 NOTE — ED Provider Notes (Signed)
[facility] EMERGENCY DEPARTMENT Provider Note   CSN: [id number] Arrival date & time: 11/08/17  1950     History   Chief Complaint Chief Complaint  Patient presents with  . Fall    HPI     [name] [name] is a 84 y.o. female who is a DNR on hospice care brought in by EMS for unwitnessed fall off of bed.  Patient is not anticoagulated, she endorses no pain at this time.  Chart review shows last tetanus shot 1949.  Level 5 caveat secondary to dementia   Past Medical History:  Diagnosis Date  . Anemia    a. Chronic iron deficiency anemia  . Anxiety   . CAD (coronary artery disease)    a. Cath 2004: 70% ostial LAD, 50-60% prox ramus disease, treated medically. b. Nuc 12/2009 - EF 74%, normal study.  . Chronic diastolic CHF (congestive heart failure) (HCC)    a. Echo 02/2014: mild focal basal hypertrophy of septum, Normal LV function EF 55-60%; grade 1 diastolic dysfunction; mildly elevated PASP.  Marland Kitchen CKD (chronic kidney disease), stage III (HCC)   . COPD (chronic obstructive pulmonary disease) (HCC)   . Coronary artery disease   . Depression   . Dizziness   . Fatigue   . GERD (gastroesophageal reflux disease)   . Hyperlipidemia   . Hypertension   . Memory disorder   . Seizure disorder (HCC)   . Syncope and collapse    RECURRENT, RELATED TO HYPONATREMIA, NARCOTICS, AND BRADYCARDIA  . Tobacco abuse    PAST    Patient Active Problem List   Diagnosis Date Noted  . DNR (do not resuscitate)   . Encounter for hospice care discussion   . Palliative care encounter   . Sepsis (HCC)   . AKI (acute kidney injury) (HCC)   . Mild protein-calorie malnutrition (HCC)   . Dementia without behavioral disturbance   . Atrial fibrillation with RVR (HCC)   . Urinary tract infection 08/26/2017  . Dehydration 08/12/2016  . SIRS (systemic inflammatory response syndrome) (HCC) 08/12/2016  . Malnutrition of moderate degree (HCC) 01/19/2015  . Femur fracture (HCC) 01/18/2015    . Recurrent falls 01/18/2015  . Acute renal failure (HCC) 01/18/2015  . Dehydration with hyponatremia 01/18/2015  . FTT (failure to thrive) in adult 01/18/2015  . Severe protein-calorie malnutrition (HCC) 01/18/2015  . Hypokalemia 01/18/2015  . Periprosthetic fracture around internal prosthetic joint 01/18/2015  . Closed femur fracture (HCC)   . Fracture of femoral neck, right (HCC) 12/05/2014  . CKD (chronic kidney disease) stage 3, GFR 30-59 ml/min (HCC) 12/05/2014  . Diastolic CHF, chronic (HCC) 11/17/2011  . GERD (gastroesophageal reflux disease) 09/19/2011  . Orthostatic hypotension 05/11/2011  . Anxiety   . Seizure disorder (HCC)   . Hypertension   . Hyperlipidemia   . Coronary artery disease   . Anemia   . Tobacco abuse   . Memory disorder     Past Surgical History:  Procedure Laterality Date  . ABDOMINAL HYSTERECTOMY     early 1970's for fibroid  . CARDIAC CATHETERIZATION  2004  . HIP ARTHROPLASTY Right 12/05/2014   Procedure: ARTHROPLASTY BIPOLAR HIP;  Surgeon: Jodi Geralds, MD;  Location: [facility] OR;  Service: Orthopedics;  Laterality: Right;  . ORIF PERIPROSTHETIC FRACTURE Right 01/18/2015   Procedure: OPEN REDUCTION INTERNAL FIXATION (ORIF) PERIPROSTHETIC FRACTURE;  Surgeon: Sheral Apley, MD;  Location: [facility] OR;  Service: Orthopedics;  Laterality: Right;    OB History  No data available       Home Medications    Prior to Admission medications   Medication Sig Start Date End Date Taking? Authorizing Provider  acetaminophen (TYLENOL) 500 MG tablet Take 1,000 mg by mouth every 6 (six) hours as needed. For pain.    [provider]  albuterol (PROVENTIL HFA;VENTOLIN HFA) 108 (90 BASE) MCG/ACT inhaler Inhale 2 puffs into the lungs every 4 (four) hours as needed for wheezing or shortness of breath. Patient not taking: Reported on 08/26/2017 05/28/15   Azalia Bilisampos, Kevin, MD  cephALEXin (KEFLEX) 500 MG capsule Take 1 capsule (500 mg total) by mouth 2 (two) times  daily. 11/09/17   Dali Kraner, Joni ReiningNicole, PA-C  clopidogrel (PLAVIX) 75 MG tablet Take 75 mg by mouth daily.    [provider]  diltiazem (CARDIZEM CD) 120 MG 24 hr capsule Take 1 capsule (120 mg total) by mouth daily. 08/30/17   Myrene BuddyFletcher, Jacob, MD  docusate sodium (COLACE) 100 MG capsule Take 1 capsule (100 mg total) by mouth 2 (two) times daily. 01/21/15   Calvert Cantorizwan, Saima, MD  escitalopram (LEXAPRO) 5 MG tablet Take 5 mg by mouth daily.    [provider]  feeding supplement, ENSURE ENLIVE, (ENSURE ENLIVE) LIQD Take 237 mLs by mouth 2 (two) times daily between meals. Patient not taking: Reported on 08/26/2017 01/21/15   Calvert Cantorizwan, Saima, MD  furosemide (LASIX) 20 MG tablet Take 1 tablet (20 mg total) by mouth daily. 01/21/15   Calvert Cantorizwan, Saima, MD  HYDROcodone-acetaminophen (NORCO) 5-325 MG tablet Take 1-2 tablets by mouth every 6 (six) hours as needed for moderate pain. Patient not taking: Reported on 08/26/2017 08/16/16   Dorothea OgleMyers, Iskra M, MD  ipratropium (ATROVENT) 0.02 % nebulizer solution Take 0.5 mg by nebulization 2 (two) times daily.    [provider]  isosorbide mononitrate (IMDUR) 30 MG 24 hr tablet Take 0.5 tablets (15 mg total) by mouth daily. 08/16/16   Dorothea OgleMyers, Iskra M, MD  levETIRAcetam (KEPPRA) 750 MG tablet Take 1 tablet (750 mg total) by mouth every 12 (twelve) hours. 01/22/15   Calvert Cantorizwan, Saima, MD  nitroGLYCERIN (NITROSTAT) 0.4 MG SL tablet Place 0.4 mg under the tongue every 5 (five) minutes as needed for chest pain.    [provider]  ondansetron (ZOFRAN ODT) 4 MG disintegrating tablet Take 1 tablet (4 mg total) by mouth every 8 (eight) hours as needed for nausea or vomiting. 08/21/14   Ward, Layla MawKristen N, DO  pregabalin (LYRICA) 75 MG capsule Take 75 mg by mouth at bedtime. 11/27/14   [provider]  tiotropium (SPIRIVA) 18 MCG inhalation capsule Place 18 mcg into inhaler and inhale daily. 11/26/14   [provider]    Family History Family History    Problem Relation Age of Onset  . Heart attack Father   . CAD Father   . Heart failure Mother   . Congestive Heart Failure Mother   . Other Brother        cerebral hemmorhage  . Coronary artery disease Sister     Social History Social History   Tobacco Use  . Smoking status: Former Smoker    Packs/day: 1.00    Years: 40.00    Pack years: 40.00    Last attempt to quit: 08/23/1999    Years since quitting: 18.2  . Smokeless tobacco: Never Used  Substance Use Topics  . Alcohol use: No  . Drug use: No     Allergies   Codeine   Review of  Systems Review of Systems  Unable to perform ROS: Dementia     Physical Exam Updated Vital Signs BP 109/82   Pulse 83   Temp 98.7 F (37.1 C) (Oral)   Resp (!) 23   SpO2 99%   Physical Exam  Constitutional: She is oriented to person, place, and time. She appears well-developed and well-nourished. No distress.  Frail, chronically deconditioned  HENT:  Head: Normocephalic.    Mouth/Throat: Oropharynx is clear and moist.  3 full-thickness laceration to scalp as diagrammed.  Eyes: Conjunctivae and EOM are normal. Pupils are equal, round, and reactive to light.  Neck: Normal range of motion.  No midline C-spine tenderness  Cardiovascular: Normal rate, regular rhythm and intact distal pulses.  Pulmonary/Chest: Effort normal and breath sounds normal. No stridor. No respiratory distress. She has no wheezes. She has no rales. She exhibits no tenderness.  Abdominal: Soft. There is no tenderness.  Musculoskeletal: Normal range of motion.  Moving all extremities, no focal tenderness over major joints.  Neurological: She is alert and oriented to person, place, and time.  Skin: She is not diaphoretic.  Psychiatric: She has a normal mood and affect.  Nursing note and vitals reviewed.    ED Treatments / Results  Labs (all labs ordered are listed, but only abnormal results are displayed) Labs Reviewed  URINALYSIS, ROUTINE W REFLEX  MICROSCOPIC - Abnormal; Notable for the following components:      Result Value   APPearance CLOUDY (*)    Hgb urine dipstick SMALL (*)    Leukocytes, UA LARGE (*)    Bacteria, UA MANY (*)    Squamous Epithelial / LPF 0-5 (*)    All other components within normal limits  CBC WITH DIFFERENTIAL/PLATELET - Abnormal; Notable for the following components:   RBC 2.91 (*)    Hemoglobin 7.9 (*)    HCT 24.6 (*)    RDW 16.6 (*)    All other components within normal limits  BASIC METABOLIC PANEL - Abnormal; Notable for the following components:   Glucose, Bld 137 (*)    Creatinine, Ser 1.26 (*)    Calcium 8.5 (*)    GFR calc non Af Amer 37 (*)    GFR calc Af Amer 43 (*)    All other components within normal limits  URINE CULTURE    EKG  EKG Interpretation None       Radiology Ct Head Wo Contrast  Result Date: 11/08/2017 CLINICAL DATA:  Rolled off bed in room, on Plavix, history coronary artery disease, chronic diastolic CHF, COPD, hypertension, former smoker EXAM: CT HEAD WITHOUT CONTRAST CT CERVICAL SPINE WITHOUT CONTRAST TECHNIQUE: Multidetector CT imaging of the head and cervical spine was performed following the standard protocol without intravenous contrast. Multiplanar CT image reconstructions of the cervical spine were also generated. COMPARISON:  04/08/2014 CT head; correlation CT chest 08/13/2016 FINDINGS: CT HEAD FINDINGS Brain: Generalized atrophy. Normal ventricular morphology. No midline shift or mass effect. Small vessel chronic ischemic changes of deep cerebral white matter. No intracranial hemorrhage, mass lesion, evidence of acute infarction, or extra-axial fluid collection. Vascular: No hyperdense vessels. Atherosclerotic calcification of internal carotid arteries bilaterally at skull base Skull: Intact. Degenerative changes of BILATERAL temporomandibular joints noted. Sinuses/Orbits: Clear Other: N/A CT CERVICAL SPINE FINDINGS Alignment: Minimal retrolisthesis at C5-C6.  Minimal anterolisthesis at C7-T1 unchanged. Skull base and vertebrae: Bones appear demineralized. Visualized skull base intact. Disc space narrowing with endplate spur formation at C5-C6, less at C6-C7. Multilevel facet degenerative changes. Significant  encroachment upon cervical foramina by uncovertebral spurs greatest at C5-C6 and C6-C7. Slight anterior vertebral height loss at T2, appears old and is unchanged since 08/13/2016. No acute fracture, subluxation or bone destruction. Soft tissues and spinal canal: Prevertebral soft tissues normal thickness. Atherosclerotic calcifications within the carotid systems. Disc levels:  No additional abnormalities Upper chest: Biapical scarring Other: N/A IMPRESSION: Atrophy with small vessel chronic ischemic changes of deep cerebral white matter. No acute intracranial abnormalities. Degenerative disc and facet disease changes of the cervical spine. No acute cervical spine abnormalities. Electronically Signed   By: Ulyses Southward M.D.   On: 11/08/2017 20:52   Ct Cervical Spine Wo Contrast  Result Date: 11/08/2017 CLINICAL DATA:  Rolled off bed in room, on Plavix, history coronary artery disease, chronic diastolic CHF, COPD, hypertension, former smoker EXAM: CT HEAD WITHOUT CONTRAST CT CERVICAL SPINE WITHOUT CONTRAST TECHNIQUE: Multidetector CT imaging of the head and cervical spine was performed following the standard protocol without intravenous contrast. Multiplanar CT image reconstructions of the cervical spine were also generated. COMPARISON:  04/08/2014 CT head; correlation CT chest 08/13/2016 FINDINGS: CT HEAD FINDINGS Brain: Generalized atrophy. Normal ventricular morphology. No midline shift or mass effect. Small vessel chronic ischemic changes of deep cerebral white matter. No intracranial hemorrhage, mass lesion, evidence of acute infarction, or extra-axial fluid collection. Vascular: No hyperdense vessels. Atherosclerotic calcification of internal carotid arteries  bilaterally at skull base Skull: Intact. Degenerative changes of BILATERAL temporomandibular joints noted. Sinuses/Orbits: Clear Other: N/A CT CERVICAL SPINE FINDINGS Alignment: Minimal retrolisthesis at C5-C6. Minimal anterolisthesis at C7-T1 unchanged. Skull base and vertebrae: Bones appear demineralized. Visualized skull base intact. Disc space narrowing with endplate spur formation at C5-C6, less at C6-C7. Multilevel facet degenerative changes. Significant encroachment upon cervical foramina by uncovertebral spurs greatest at C5-C6 and C6-C7. Slight anterior vertebral height loss at T2, appears old and is unchanged since 08/13/2016. No acute fracture, subluxation or bone destruction. Soft tissues and spinal canal: Prevertebral soft tissues normal thickness. Atherosclerotic calcifications within the carotid systems. Disc levels:  No additional abnormalities Upper chest: Biapical scarring Other: N/A IMPRESSION: Atrophy with small vessel chronic ischemic changes of deep cerebral white matter. No acute intracranial abnormalities. Degenerative disc and facet disease changes of the cervical spine. No acute cervical spine abnormalities. Electronically Signed   By: Ulyses Southward M.D.   On: 11/08/2017 20:52    Procedures .Marland KitchenLaceration Repair Date/Time: 11/09/2017 12:08 AM Performed by: Wynetta Emery, PA-C Authorized by: Wynetta Emery, PA-C   Consent:    Consent obtained:  Verbal Laceration details:    Location:  Scalp   Scalp location:  Crown   Length (cm):  3 Repair type:    Repair type:  Simple Pre-procedure details:    Preparation:  Patient was prepped and draped in usual sterile fashion Skin repair:    Repair method:  Staples   Number of staples:  3 Approximation:    Approximation:  Close   Vermilion border: well-aligned   Post-procedure details:    Dressing:  Antibiotic ointment   (including critical care time)  Medications Ordered in ED Medications  cefTRIAXone (ROCEPHIN) 1 g in  sodium chloride 0.9 % 100 mL IVPB (1 g Intravenous New Bag/Given 11/08/17 2340)  bacitracin ointment 1 application (not administered)  lidocaine-EPINEPHrine-tetracaine (LET) solution (3 mLs Topical Given 11/08/17 2138)  lidocaine (PF) (XYLOCAINE) 1 % injection 5 mL (5 mLs Intradermal Given 11/08/17 2140)  Tdap (BOOSTRIX) injection 0.5 mL (0.5 mLs Intramuscular Given 11/08/17 2137)     Initial  Impression / Assessment and Plan / ED Course  I have reviewed the triage vital signs and the nursing notes.  Pertinent labs & imaging results that were available during my care of the patient were reviewed by me and considered in my medical decision making (see chart for details).     Vitals:   11/08/17 2015 11/08/17 2103 11/08/17 2145  BP: 105/63 (!) 103/43 109/82  Pulse: 86 83   Resp: 17 20 (!) 23  Temp:  98.7 F (37.1 C)   TempSrc:  Oral   SpO2: 99% 99%     Medications  cefTRIAXone (ROCEPHIN) 1 g in sodium chloride 0.9 % 100 mL IVPB (1 g Intravenous New Bag/Given 11/08/17 2340)  bacitracin ointment 1 application (not administered)  lidocaine-EPINEPHrine-tetracaine (LET) solution (3 mLs Topical Given 11/08/17 2138)  lidocaine (PF) (XYLOCAINE) 1 % injection 5 mL (5 mLs Intradermal Given 11/08/17 2140)  Tdap (BOOSTRIX) injection 0.5 mL (0.5 mLs Intramuscular Given 11/08/17 2137)    [name] [name] is 84 y.o. female presenting with scalp laceration patient fell off from bed earlier in the evening.  This patient is a DNR and hospice patient, she is a large laceration to the scalp, wound is cleaned and closed tetanus shot is updated CT head and neck negative.  She has no other gross tenderness on my exam.  Urinalysis is infected, IV Rocephin given, culture pending, basic blood work without significant abnormality she will be discharged home on Keflex for UTI.  Evaluation does not show pathology that would require ongoing emergent intervention or inpatient treatment. Pt is hemodynamically stable and  mentating appropriately. Discussed findings and plan with patient/guardian, who agrees with care plan. All questions answered. Return precautions discussed and outpatient follow up given.    Final Clinical Impressions(s) / ED Diagnoses   Final diagnoses:  Acute cystitis with hematuria  Fall, initial encounter  Scalp laceration, initial encounter    ED Discharge Orders        Ordered    cephALEXin (KEFLEX) 500 MG capsule  2 times daily     11/09/17 0006       Madonna Flegal, Mardella Layman 11/09/17 0009    Melene Plan, DO 11/10/17 1504

## 2017-11-08 NOTE — ED Triage Notes (Signed)
Pt to ED via EMS after rolling off bed in room. On Plavix. CBG 104, 112/65, Hx of Afib but EMS reports NSR. 2 in lac to back of head. No active bleeding. Follows commands. Oriented to normal, trouble recalling time. Pt remembers hurting head but not fall. NAD.

## 2017-11-09 MED ORDER — BACITRACIN ZINC 500 UNIT/GM EX OINT
1.0000 "application " | TOPICAL_OINTMENT | Freq: Once | CUTANEOUS | Status: AC
  Administered 2017-11-09: 1 via TOPICAL
  Filled 2017-11-09: qty 0.9

## 2017-11-09 MED ORDER — CEPHALEXIN 500 MG PO CAPS: 500.0000 mg | ORAL_CAPSULE | Freq: Two times a day (BID) | ORAL | 0 refills | Status: DC

## 2017-11-09 NOTE — Discharge Instructions (Signed)
Keep wound dry and do not remove dressing for 24 hours if possible. After that, wash gently morning and night (every 12 hours) with soap and water. Use a topical antibiotic ointment and cover with a bandaid or gauze.    Do NOT use rubbing alcohol or hydrogen peroxide, do not soak the area   Present to your primary care doctor or the urgent care of your choice, or the ED for staple removal in 8-10 days.  You can wash the hair as you normally would, you may want to get a baby shampoo, you can apply bacitracin or Neosporin to the wound.    If you see signs of infection (warmth, redness, tenderness, pus, sharp increase in pain, fever, red streaking in the skin) immediately return to the emergency department.

## 2017-11-13 LAB — URINE CULTURE: Culture: >=100000 — AB

## 2017-11-14 ENCOUNTER — Telehealth: Payer: Self-pay | Admitting: Emergency Medicine

## 2017-11-14 NOTE — Progress Notes (Signed)
ED Antimicrobial Stewardship Positive Culture Follow Up   [name] [name] is an 84 y.o. female who [facility]2017 with a chief complaint of  Chief Complaint  Patient presents with  . Fall    Recent Results (from the past 720 hour(s))  Urine culture     Status: Abnormal   Collection Time: 11/08/17  9:25 PM  Result Value Ref Range Status   Specimen Description URINE, CLEAN CATCH  Final   Special Requests   Final    NONE Performed at [facility], [address] [place], [place] [address]    Culture (A)  Final    >=100,000 COLONIES/mL ENTEROBACTER CLOACAE 50,000 COLONIES/mL ESCHERICHIA COLI    Report Status 11/13/2017 FINAL  Final   Organism ID, Bacteria ESCHERICHIA COLI (A)  Final   Organism ID, Bacteria ENTEROBACTER CLOACAE (A)  Final      Susceptibility   Enterobacter cloacae - MIC*    CEFAZOLIN >=64 RESISTANT Resistant     CEFTRIAXONE 8 SENSITIVE Sensitive     CIPROFLOXACIN 1 SENSITIVE Sensitive     GENTAMICIN 8 INTERMEDIATE Intermediate     IMIPENEM <=0.25 SENSITIVE Sensitive     NITROFURANTOIN 32 SENSITIVE Sensitive     TRIMETH/SULFA <=20 SENSITIVE Sensitive     PIP/TAZO <=4 SENSITIVE Sensitive     * >=100,000 COLONIES/mL ENTEROBACTER CLOACAE   Escherichia coli - MIC*    AMPICILLIN >=32 RESISTANT Resistant     CEFAZOLIN >=64 RESISTANT Resistant     CEFTRIAXONE RESISTANT Resistant     CIPROFLOXACIN 1 SENSITIVE Sensitive     GENTAMICIN 8 INTERMEDIATE Intermediate     IMIPENEM <=0.25 SENSITIVE Sensitive     NITROFURANTOIN 64 INTERMEDIATE Intermediate     TRIMETH/SULFA >=320 RESISTANT Resistant     AMPICILLIN/SULBACTAM 16 INTERMEDIATE Intermediate     PIP/TAZO <=4 SENSITIVE Sensitive     * 50,000 COLONIES/mL ESCHERICHIA COLI    [x]  Treated with Cephalexin, organism resistant to prescribed antimicrobial  New antibiotic prescription: Fosfomycin 3g PO x 1 dose Stop cephalexin  ED Provider: Burna FortsJeff Hedges, PA-C   Sallee Provencalurner, [name]  S 11/14/2017, 12:39 PM Infectious Diseases Pharmacist Phone# [phone]

## 2017-11-14 NOTE — Telephone Encounter (Signed)
Post ED Visit - Positive Culture Follow-up: Successful Patient Follow-Up  Culture assessed and recommendations reviewed by: []  Enzo BiNathan Batchelder, Pharm.D. []  Celedonio MiyamotoJeremy Frens, Pharm.D., BCPS AQ-ID []  Garvin FilaMike Maccia, Pharm.D., BCPS []  Georgina PillionElizabeth Martin, Pharm.D., BCPS []  Shadow LakeMinh Pham, [address]Pharm.D., BCPS, AAHIVP [x]  Estella HuskMichelle Turner, Pharm.D., BCPS, AAHIVP []  Lysle Pearlachel Rumbarger, PharmD, BCPS []  Casilda Carlsaylor Stone, PharmD, BCPS []  Pollyann SamplesAndy Johnston, PharmD, BCPS  Positive urine culture  []  Patient discharged without antimicrobial prescription and treatment is now indicated [x]  Organism is resistant to prescribed ED discharge antimicrobial []  Patient with positive blood cultures  Changes discussed with ED provider: Burna FortsJeff Hedges PA New antibiotic prescription stop cephalexin, start fosfomycin 3 grams po x 1 dose Order faxed [phone]  [facility], 11/14/2017 1619   Berle MullMiller, Solmon Bohr 11/14/2017, 4:16 PM

## 2022-06-08 ENCOUNTER — Other Ambulatory Visit: Payer: Self-pay

## 2022-06-08 ENCOUNTER — Emergency Department (HOSPITAL_COMMUNITY)

## 2022-06-08 ENCOUNTER — Emergency Department (HOSPITAL_COMMUNITY)
Admission: EM | Admit: 2022-06-08 | Discharge: 2022-06-08 | Disposition: A | Discharge status: Skilled Nursing Facility | Attending: Emergency Medicine | Admitting: Emergency Medicine

## 2022-06-08 DIAGNOSIS — S52201A Unspecified fracture of shaft of right ulna, initial encounter for closed fracture: Secondary | ICD-10-CM | POA: Insufficient documentation

## 2022-06-08 DIAGNOSIS — S4991XA Unspecified injury of right shoulder and upper arm, initial encounter: Secondary | ICD-10-CM | POA: Diagnosis present

## 2022-06-08 DIAGNOSIS — X58XXXA Exposure to other specified factors, initial encounter: Secondary | ICD-10-CM | POA: Diagnosis not present

## 2022-06-08 DIAGNOSIS — S52501A Unspecified fracture of the lower end of right radius, initial encounter for closed fracture: Secondary | ICD-10-CM | POA: Insufficient documentation

## 2022-06-08 DIAGNOSIS — F0390 Unspecified dementia without behavioral disturbance: Secondary | ICD-10-CM | POA: Diagnosis not present

## 2022-06-08 MED ORDER — HYDROCODONE-ACETAMINOPHEN 5-325 MG PO TABS: 1.0000 | ORAL_TABLET | Freq: Once | ORAL | Status: DC

## 2022-06-08 MED ORDER — ACETAMINOPHEN 325 MG PO TABS
650.0000 mg | ORAL_TABLET | Freq: Once | ORAL | Status: AC
  Administered 2022-06-08: 650 mg via ORAL
  Filled 2022-06-08: qty 2

## 2022-06-08 NOTE — ED Notes (Signed)
Pt verbalizes understanding of discharge instructions. Opportunity for questions and answers were provided. Pt discharged from the ED to [facility] via [place].

## 2022-06-08 NOTE — ED Notes (Signed)
Ortho at bedside.

## 2022-06-08 NOTE — ED Notes (Signed)
Splint applied by ortho

## 2022-06-08 NOTE — ED Notes (Signed)
Attempted report to [facility].

## 2022-06-08 NOTE — ED Triage Notes (Signed)
Pt BIB [facility] due to a broken right arm. Staff unsure how pt broke her arm, confirmation on x-ray. Dr wanted pt to be evaluated. Pt has dementia and contracted on left leg. Pt combative per [facility]. Alert. VSS.

## 2022-06-08 NOTE — ED Provider Notes (Signed)
[facility] EMERGENCY DEPARTMENT Provider Note   CSN: [id number] Arrival date & time: 06/08/22  3532     History  Chief Complaint  Patient presents with   Arm Injury    [name] [name] is a 88 y.o. female, hx of dementia, who presents to the ED 2/2 right arm pain.  All of history obtained from RN from [place], nurse Suanne Marker, states that the patient has been complaining for of her right arm for the last day.  States that she has been holding it, so the facility did an x-ray, found to have a fracture.  Was initially going to send to Ortho, but unable to get into Ortho for several days, so sent to the ER.  Denies any falls, no other complaints.  And is bedbound per nursing.     Home Medications Prior to Admission medications   Medication Sig Start Date End Date Taking? Authorizing Provider  acetaminophen (TYLENOL) 325 MG tablet Take 650 mg by mouth every 6 (six) hours as needed for fever (pain).   Yes [provider]  docusate sodium (COLACE) 100 MG capsule Take 1 capsule (100 mg total) by mouth 2 (two) times daily. 01/21/15  Yes Debbe Odea, MD  ferrous sulfate 325 (65 FE) MG tablet Take 325 mg by mouth 2 (two) times daily.   Yes [provider]  guaiFENesin (MUCINEX) 600 MG 12 hr tablet Take 600 mg by mouth 2 (two) times daily.   Yes [provider]  HYDROcodone-acetaminophen (NORCO) 5-325 MG tablet Take 1-2 tablets by mouth every 6 (six) hours as needed for moderate pain. Patient taking differently: Take 1-2 tablets by mouth See admin instructions. 2 entries on MAR : 1 tablet by mouth twice daily for pain + and additional 1-2 tablets by mouth every 6 hours as needed for pain 08/16/16  Yes Theodis Blaze, MD  Infant Care Products [facility] EX) Apply 1 application  topically 3 (three) times daily. Apply cream to buttocks after each incontinent episode.   Yes [provider]  levETIRAcetam (KEPPRA) 750 MG tablet Take  1 tablet (750 mg total) by mouth every 12 (twelve) hours. 01/22/15  Yes Debbe Odea, MD  loperamide (IMODIUM) 2 MG capsule Take 2-4 mg by mouth See admin instructions. 4 mg for the first loose stool, 2 mg after subsequent stool and not to exceed 8 capsules in 24 hours.   Yes [provider]  nitroGLYCERIN (NITROSTAT) 0.4 MG SL tablet Place 0.4 mg under the tongue every 5 (five) minutes as needed for chest pain.   Yes [provider]  Nutritional Supplements (NUTRITIONAL DRINK PO) Take 120 mLs by mouth in the morning, at noon, and at bedtime. Med Pass.   Yes [provider]  pantoprazole (PROTONIX) 40 MG tablet Take 40 mg by mouth daily.   Yes [provider]  promethazine (PHENERGAN) 25 MG suppository Place 25 mg rectally every 6 (six) hours as needed for nausea or vomiting.   Yes [provider]  promethazine (PHENERGAN) 25 MG tablet Take 25 mg by mouth every 6 (six) hours as needed for nausea or vomiting.   Yes [provider]  sucralfate (CARAFATE) 1 g tablet Take 1 g by mouth daily with supper.   Yes [provider]      Allergies    Codeine    Review of Systems   Review of Systems  Unable to perform ROS: Dementia    Physical Exam Updated Vital Signs  BP (!) 112/53 (BP Location: Left Arm)   Pulse 89   Temp 98.3 F (36.8 C) (Oral)   Resp 16   SpO2 92%  Physical Exam HENT:     Mouth/Throat:     Mouth: Mucous membranes are moist.  Eyes:     Extraocular Movements: Extraocular movements intact.     Pupils: Pupils are equal, round, and reactive to light.  Cardiovascular:     Rate and Rhythm: Normal rate and regular rhythm.     Pulses: Normal pulses.  Pulmonary:     Effort: Pulmonary effort is normal.     Breath sounds: Normal breath sounds.  Abdominal:     General: Abdomen is flat.  Musculoskeletal:     Cervical back: Normal range of motion.     Comments: Right arm: ttp of mid radius/ulna. No ttp of hand/wrist.  Pain with extension/flexion of arm. +mild ttp of distal humerus. ROM intact. Pulses present. No neurodeficits.   Neurological:     Mental Status: Mental status is at baseline.     Comments: +demented     ED Results / Procedures / Treatments   Labs (all labs ordered are listed, but only abnormal results are displayed) Labs Reviewed - No data to display  EKG None  Radiology DG Wrist Complete Right  Result Date: 06/08/2022 CLINICAL DATA:  guarding arm; possible fx, recommended wrist xrays EXAM: RIGHT WRIST - COMPLETE 3+ VIEW COMPARISON:  Same day forearm radiograph FINDINGS: Mild displaced distal ulnar diaphyseal fracture. There is a linear sclerotic line along the distal radial metaphysis with possible nondisplaced lucency extending longitudinally towards the articular surface. There is mild radiocarpal and intercarpal arthritis. There is a well corticated bony fragment adjacent to the ulnar styloid compatible with a chronic injury. There is moderate base of thumb osteoarthritis. Likely chronic lucency within the first metacarpal. Osteopenia. IMPRESSION: Possible mildly impacted distal radial metaphyseal fracture with questionable linear lucency coursing towards the articular surface. These findings could also represent an old injury, as there is evidence of an old ulnar styloid avulsion. Mildly displaced distal ulnar diaphyseal fracture as noted on forearm radiograph. Electronically Signed   By: Caprice Renshaw M.D.   On: 06/08/2022 11:51   DG Humerus Right  Result Date: 06/08/2022 CLINICAL DATA:  Arm pain EXAM: RIGHT HUMERUS - 2+ VIEW COMPARISON:  None available FINDINGS: There is no evidence of acute fracture involving the right humerus. Alignment is normal. Mild degenerative changes at the shoulder and elbow. IMPRESSION: No evidence of acute fracture involving the right humerus. Electronically Signed   By: Caprice Renshaw M.D.   On: 06/08/2022 10:22   DG Forearm Right  Result Date:  06/08/2022 CLINICAL DATA:  Possible broken arm per facility? No known falls EXAM: RIGHT FOREARM - 2 VIEW COMPARISON:  None Available. FINDINGS: There is a minimally displaced transverse ulnar diaphyseal fracture. Normal radial head alignment at the elbow in without evidence of elbow joint effusion. At there is a possible nondisplaced distal radial metaphyseal fracture. No evidence of an elbow joint effusion. IMPRESSION: Minimally displaced ulnar diaphyseal fracture. Possible nondisplaced distal radial metaphyseal fracture. Recommend wrist radiographs. Electronically Signed   By: Caprice Renshaw M.D.   On: 06/08/2022 10:19    Procedures Procedures   Medications Ordered in ED Medications  acetaminophen (TYLENOL) tablet 650 mg (650 mg Oral Given 06/08/22 1023)    ED Course/ Medical Decision Making/ A&P  Medical Decision Making Patient is a 88 year old female, here from a nursing facility secondary to right arm guarding, possible fracture.  Facility took an x-ray, and found that she had a broken arm.  No trauma, or fall.  Discussed with daughter, patient is on hospice, she declines any further work-up for falls, recommend minimal work-up, secondary to patient's comfort.  Amount and/or Complexity of Data Reviewed Radiology: ordered.    Details: Reviewed x-rays, ulnar shaft fracture, right distal radius (possibly old?) Discussion of management or test interpretation with external provider(s): Patient 88 year old female, on hospice, sent to the ER given fracture of arm.  Found to have ulnar shaft fracture, mildly displaced, and a possible right distal radius fracture of undetermined age.  Pain controlled.  Unclear etiology of fracture, discussed with daughter, she states minimal work-up secondary to patient being on hospice, comfort.  X-rays obtained, see above.  Treated fracture with sugar-tong splint.  Will need to follow-up with orthopedics.  Return precautions listed on  discharge papers.  Risk OTC drugs.   Final Clinical Impression(s) / ED Diagnoses Final diagnoses:  Closed fracture of shaft of right ulna, unspecified fracture morphology, initial encounter  Closed fracture of distal end of right radius, unspecified fracture morphology, initial encounter    Rx / DC Orders ED Discharge Orders          Ordered    AMB referral to orthopedics        06/08/22 1204              Brooks Stotz, [place], [place] 06/08/22 1258    Pricilla Loveless, MD 06/13/22 1747

## 2022-06-08 NOTE — Discharge Instructions (Addendum)
Follow with orthopedics, keep the splint dry, and clean.  Place a trash bag over the splint while showering.  Make sure the arm is elevated, and if there are any changes to the skin color, coolness of the extremities, please return to the ER.

## 2022-06-08 NOTE — ED Notes (Signed)
Patient transported to X-ray 

## 2022-06-14 ENCOUNTER — Ambulatory Visit (INDEPENDENT_AMBULATORY_CARE_PROVIDER_SITE_OTHER): Admitting: Orthopaedic Surgery

## 2022-06-14 DIAGNOSIS — S52691A Other fracture of lower end of right ulna, initial encounter for closed fracture: Secondary | ICD-10-CM

## 2022-06-14 DIAGNOSIS — S52601A Unspecified fracture of lower end of right ulna, initial encounter for closed fracture: Secondary | ICD-10-CM | POA: Insufficient documentation

## 2022-06-14 NOTE — Progress Notes (Signed)
Office Visit Note   Patient: [name] [name]           Date of Birth: 1926/10/01           MRN: [id number] Visit Date: 06/14/2022              Requested by: Pete Pelt, PA [address] [place],  [place] [address] PCP: [name], MD   Assessment & Plan: Visit Diagnoses:  1. Other closed fracture of distal end of right ulna, initial encounter     Plan: Impression is minimally displaced right ulnar fracture.  We will immobilize in an a short arm cast.  She is nonweightbearing to the upper extremity at baseline.  Recheck in 3 weeks with two-view x-rays of the right forearm.  Follow-Up Instructions: Return in about 3 weeks (around 07/05/2022).   Orders:  No orders of the defined types were placed in this encounter.  No orders of the defined types were placed in this encounter.     Procedures: No procedures performed   Clinical Data: No additional findings.   Subjective: Chief Complaint  Patient presents with   Right Arm - Pain    HPI [name] [name] is a 88 year old female here for follow-up from the ER for right ulna fracture.  She fell off her wheelchair at her facility.  She does have dementia.  Lives permanently at [facility].  She is not ambulatory and is 100% Hoyer lift for transfers.  Review of Systems  Constitutional: Negative.   HENT: Negative.    Eyes: Negative.   Respiratory: Negative.    Cardiovascular: Negative.   Endocrine: Negative.   Musculoskeletal: Negative.   Neurological: Negative.   Hematological: Negative.   Psychiatric/Behavioral: Negative.    All other systems reviewed and are negative.    Objective: Vital Signs: There were no vitals taken for this visit.  Physical Exam Vitals and nursing note reviewed.  Constitutional:      Appearance: She is well-developed.  HENT:     Head: Atraumatic.     Nose: Nose normal.  Eyes:     Extraocular Movements: Extraocular movements intact.  Cardiovascular:     Pulses: Normal pulses.   Pulmonary:     Effort: Pulmonary effort is normal.  Abdominal:     Palpations: Abdomen is soft.  Musculoskeletal:     Cervical back: Neck supple.  Skin:    General: Skin is warm.     Capillary Refill: Capillary refill takes less than 2 seconds.  Neurological:     Mental Status: She is alert. Mental status is at baseline.  Psychiatric:        Behavior: Behavior normal.        Thought Content: Thought content normal.        Judgment: Judgment normal.     Ortho Exam Examination of the right forearm shows mild swelling and bruising.  No neurovascular compromise.  Slight tenderness and withdrawal to palpation over the fracture site. Specialty Comments:  No specialty comments available.  Imaging: No results found.   PMFS History: Patient Active Problem List   Diagnosis Date Noted   Closed fracture of lower end of right ulna 06/14/2022   DNR (do not resuscitate)    Encounter for hospice care discussion    Palliative care encounter    Sepsis (HCC)    AKI (acute kidney injury) (HCC)    Mild protein-calorie malnutrition (HCC)    Dementia without behavioral disturbance (HCC)    Atrial fibrillation with  RVR ([place])    Urinary tract infection 08/26/2017   Dehydration 08/12/2016   SIRS (systemic inflammatory response syndrome) ([place]) 08/12/2016   Malnutrition of moderate degree ([place]) 01/19/2015   Femur fracture ([place]) 01/18/2015   Recurrent falls 01/18/2015   Acute renal failure ([place]) 01/18/2015   Dehydration with hyponatremia 01/18/2015   FTT (failure to thrive) in adult 01/18/2015   Severe protein-calorie malnutrition ([place]) 01/18/2015   Hypokalemia 01/18/2015   Periprosthetic fracture around internal prosthetic joint 01/18/2015   Closed femur fracture ([place])    Fracture of femoral neck, right ([place]) 12/05/2014   CKD (chronic kidney disease) stage 3, GFR 30-59 ml/min (HCC) 73/53/2992   Diastolic CHF, chronic ([place]) 11/17/2011   GERD (gastroesophageal reflux disease) 09/19/2011    Orthostatic hypotension 05/11/2011   Anxiety    Seizure disorder ([place])    Hypertension    Hyperlipidemia    Coronary artery disease    Anemia    Tobacco abuse    Memory disorder    Past Medical History:  Diagnosis Date   Anemia    a. Chronic iron deficiency anemia   Anxiety    CAD (coronary artery disease)    a. Cath 2004: 70% ostial LAD, 50-60% prox ramus disease, treated medically. b. Nuc 12/2009 - EF 74%, normal study.   Chronic diastolic CHF (congestive heart failure) ([place])    a. Echo 02/2014: mild focal basal hypertrophy of septum, Normal LV function EF 42-68%; grade 1 diastolic dysfunction; mildly elevated PASP.   CKD (chronic kidney disease), stage III (HCC)    COPD (chronic obstructive pulmonary disease) (HCC)    Coronary artery disease    Depression    Dizziness    Fatigue    GERD (gastroesophageal reflux disease)    Hyperlipidemia    Hypertension    Memory disorder    Seizure disorder (HCC)    Syncope and collapse    RECURRENT, RELATED TO HYPONATREMIA, NARCOTICS, AND BRADYCARDIA   Tobacco abuse    PAST    Family History  Problem Relation Age of Onset   Heart attack Father    CAD Father    Heart failure Mother    Congestive Heart Failure Mother    Other Brother        cerebral hemmorhage   Coronary artery disease Sister     Past Surgical History:  Procedure Laterality Date   ABDOMINAL HYSTERECTOMY     early 1970's for fibroid   CARDIAC CATHETERIZATION  2004   HIP ARTHROPLASTY Right 12/05/2014   Procedure: ARTHROPLASTY BIPOLAR HIP;  Surgeon: Dorna Leitz, MD;  Location: [facility];  Service: Orthopedics;  Laterality: Right;   ORIF PERIPROSTHETIC FRACTURE Right 01/18/2015   Procedure: OPEN REDUCTION INTERNAL FIXATION (ORIF) PERIPROSTHETIC FRACTURE;  Surgeon: Renette Butters, MD;  Location: [place];  Service: Orthopedics;  Laterality: Right;   Social History   Occupational History   Not on file  Tobacco Use   Smoking status: Former    Packs/day: 1.00    Years:  40.00    Total pack years: 40.00    Types: Cigarettes    Quit date: 08/23/1999    Years since quitting: 22.8   Smokeless tobacco: Never  Substance and Sexual Activity   Alcohol use: No   Drug use: No   Sexual activity: Not on file

## 2022-07-22 ENCOUNTER — Ambulatory Visit (INDEPENDENT_AMBULATORY_CARE_PROVIDER_SITE_OTHER): Payer: Medicare Other

## 2022-07-22 ENCOUNTER — Ambulatory Visit (INDEPENDENT_AMBULATORY_CARE_PROVIDER_SITE_OTHER): Admitting: Orthopaedic Surgery

## 2022-07-22 DIAGNOSIS — S52691A Other fracture of lower end of right ulna, initial encounter for closed fracture: Secondary | ICD-10-CM

## 2022-07-22 NOTE — Progress Notes (Unsigned)
Office Visit Note   Patient: [name] [name]           Date of Birth: September 14, 1926           MRN: [id number] Visit Date: 07/22/2022              Requested by: [name], MD [address] [place],  [place] [address] PCP: [name], MD   Assessment & Plan: Visit Diagnoses:  1. Other closed fracture of distal end of right ulna, initial encounter     Plan: Impression is 6 weeks status post right ulnar shaft fracture.  Radiographically, she is healing.  At this point, would like to place her in a long short arm splint.  She will follow-up with [place] in 6 weeks for repeat evaluation and x-rays of the right forearm.  Call with concerns or questions in the meantime.  Follow-Up Instructions: Return in about 6 weeks (around 09/02/2022).   Orders:  Orders Placed This Encounter  Procedures   XR Forearm Right   No orders of the defined types were placed in this encounter.     Procedures: No procedures performed   Clinical Data: No additional findings.   Subjective: Chief Complaint  Patient presents with   Right Forearm - Routine Post Op    HPI patient is a pleasant 88 year old female with dementia who is a resident at [facility] who comes in today 6 weeks status post right midshaft ulna fracture, date of injury 06/08/2022.  She is nonambulatory uses a Nurse, adult for transport.  She has been wearing a short arm cast.     Objective: Vital Signs: There were no vitals taken for this visit.    Ortho Exam right forearm exam: Does not appear to be tender.  She is neurovascularly intact distally.  Specialty Comments:  No specialty comments available.  Imaging: No results found.   PMFS History: Patient Active Problem List   Diagnosis Date Noted   Closed fracture of lower end of right ulna 06/14/2022   DNR (do not resuscitate)    Encounter for hospice care discussion    Palliative care encounter    Sepsis (HCC)    AKI (acute kidney injury) (HCC)    Mild  protein-calorie malnutrition (HCC)    Dementia without behavioral disturbance (HCC)    Atrial fibrillation with RVR (HCC)    Urinary tract infection 08/26/2017   Dehydration 08/12/2016   SIRS (systemic inflammatory response syndrome) (HCC) 08/12/2016   Malnutrition of moderate degree (HCC) 01/19/2015   Femur fracture (HCC) 01/18/2015   Recurrent falls 01/18/2015   Acute renal failure (HCC) 01/18/2015   Dehydration with hyponatremia 01/18/2015   FTT (failure to thrive) in adult 01/18/2015   Severe protein-calorie malnutrition (HCC) 01/18/2015   Hypokalemia 01/18/2015   Periprosthetic fracture around internal prosthetic joint 01/18/2015   Closed femur fracture (HCC)    Fracture of femoral neck, right (HCC) 12/05/2014   CKD (chronic kidney disease) stage 3, GFR 30-59 ml/min (HCC) 12/05/2014   Diastolic CHF, chronic (HCC) 11/17/2011   GERD (gastroesophageal reflux disease) 09/19/2011   Orthostatic hypotension 05/11/2011   Anxiety    Seizure disorder (HCC)    Hypertension    Hyperlipidemia    Coronary artery disease    Anemia    Tobacco abuse    Memory disorder    Past Medical History:  Diagnosis Date   Anemia    a. Chronic iron deficiency anemia   Anxiety    CAD (coronary artery  disease)    a. Cath 2004: 70% ostial LAD, 50-60% prox ramus disease, treated medically. b. Nuc 12/2009 - EF 74%, normal study.   Chronic diastolic CHF (congestive heart failure) (HCC)    a. Echo 02/2014: mild focal basal hypertrophy of septum, Normal LV function EF 55-60%; grade 1 diastolic dysfunction; mildly elevated PASP.   CKD (chronic kidney disease), stage III (HCC)    COPD (chronic obstructive pulmonary disease) (HCC)    Coronary artery disease    Depression    Dizziness    Fatigue    GERD (gastroesophageal reflux disease)    Hyperlipidemia    Hypertension    Memory disorder    Seizure disorder (HCC)    Syncope and collapse    RECURRENT, RELATED TO HYPONATREMIA, NARCOTICS, AND BRADYCARDIA    Tobacco abuse    PAST    Family History  Problem Relation Age of Onset   Heart attack Father    CAD Father    Heart failure Mother    Congestive Heart Failure Mother    Other Brother        cerebral hemmorhage   Coronary artery disease Sister     Past Surgical History:  Procedure Laterality Date   ABDOMINAL HYSTERECTOMY     early 1970's for fibroid   CARDIAC CATHETERIZATION  2004   HIP ARTHROPLASTY Right 12/05/2014   Procedure: ARTHROPLASTY BIPOLAR HIP;  Surgeon: Jodi Geralds, MD;  Location: [facility] OR;  Service: Orthopedics;  Laterality: Right;   ORIF PERIPROSTHETIC FRACTURE Right 01/18/2015   Procedure: OPEN REDUCTION INTERNAL FIXATION (ORIF) PERIPROSTHETIC FRACTURE;  Surgeon: Sheral Apley, MD;  Location: [facility] OR;  Service: Orthopedics;  Laterality: Right;   Social History   Occupational History   Not on file  Tobacco Use   Smoking status: Former    Packs/day: 1.00    Years: 40.00    Total pack years: 40.00    Types: Cigarettes    Quit date: 08/23/1999    Years since quitting: 22.9   Smokeless tobacco: Never  Substance and Sexual Activity   Alcohol use: No   Drug use: No   Sexual activity: Not on file

## 2022-10-31 ENCOUNTER — Emergency Department (HOSPITAL_COMMUNITY)
Admission: EM | Admit: 2022-10-31 | Discharge: 2022-10-31 | Disposition: A | Discharge status: Home / Self Care | Attending: Emergency Medicine | Admitting: Emergency Medicine

## 2022-10-31 ENCOUNTER — Emergency Department (HOSPITAL_COMMUNITY)

## 2022-10-31 ENCOUNTER — Other Ambulatory Visit: Payer: Self-pay

## 2022-10-31 DIAGNOSIS — Y92129 Unspecified place in nursing home as the place of occurrence of the external cause: Secondary | ICD-10-CM | POA: Insufficient documentation

## 2022-10-31 DIAGNOSIS — I2510 Atherosclerotic heart disease of native coronary artery without angina pectoris: Secondary | ICD-10-CM | POA: Insufficient documentation

## 2022-10-31 DIAGNOSIS — N183 Chronic kidney disease, stage 3 unspecified: Secondary | ICD-10-CM | POA: Insufficient documentation

## 2022-10-31 DIAGNOSIS — J449 Chronic obstructive pulmonary disease, unspecified: Secondary | ICD-10-CM | POA: Insufficient documentation

## 2022-10-31 DIAGNOSIS — I130 Hypertensive heart and chronic kidney disease with heart failure and stage 1 through stage 4 chronic kidney disease, or unspecified chronic kidney disease: Secondary | ICD-10-CM | POA: Insufficient documentation

## 2022-10-31 DIAGNOSIS — S0003XA Contusion of scalp, initial encounter: Secondary | ICD-10-CM | POA: Diagnosis present

## 2022-10-31 DIAGNOSIS — I5032 Chronic diastolic (congestive) heart failure: Secondary | ICD-10-CM | POA: Diagnosis not present

## 2022-10-31 DIAGNOSIS — W06XXXA Fall from bed, initial encounter: Secondary | ICD-10-CM | POA: Diagnosis not present

## 2022-10-31 DIAGNOSIS — F0390 Unspecified dementia without behavioral disturbance: Secondary | ICD-10-CM | POA: Diagnosis not present

## 2022-10-31 DIAGNOSIS — W19XXXA Unspecified fall, initial encounter: Secondary | ICD-10-CM

## 2022-10-31 NOTE — ED Provider Notes (Signed)
[facility] Provider Note   CSN: [id number] Arrival date & time: 10/31/22  0215     History  Chief Complaint  Patient presents with   Lytle Michaels    [name] [name] is a 89 y.o. female. Level 5 caveat due to dementia The history is limited by the condition of the patient.   Patient with history of dementia, CAD presents after a fall.  Patient presents from a nursing facility.  Patient apparently rolled off the bed while she was having her diaper changed.  She hit her head and has a hematoma.  It does not appear the patient is on anticoagulation.  Patient with long history of dementia unable to provide any history.   Past Medical History:  Diagnosis Date   Anemia    a. Chronic iron deficiency anemia   Anxiety    CAD (coronary artery disease)    a. Cath 2004: 70% ostial LAD, 50-60% prox ramus disease, treated medically. b. Nuc 12/2009 - EF 74%, normal study.   Chronic diastolic CHF (congestive heart failure) ([place])    a. Echo 02/2014: mild focal basal hypertrophy of septum, Normal LV function EF 0000000; grade 1 diastolic dysfunction; mildly elevated PASP.   CKD (chronic kidney disease), stage III (HCC)    COPD (chronic obstructive pulmonary disease) (HCC)    Coronary artery disease    Depression    Dizziness    Fatigue    GERD (gastroesophageal reflux disease)    Hyperlipidemia    Hypertension    Memory disorder    Seizure disorder (HCC)    Syncope and collapse    RECURRENT, RELATED TO HYPONATREMIA, NARCOTICS, AND BRADYCARDIA   Tobacco abuse    PAST    Home Medications Prior to Admission medications   Medication Sig Start Date End Date Taking? Authorizing Provider  acetaminophen (TYLENOL) 325 MG tablet Take 650 mg by mouth every 6 (six) hours as needed for fever (pain).    [provider]  docusate sodium (COLACE) 100 MG capsule Take 1 capsule (100 mg total) by mouth 2 (two) times daily. 01/21/15   Debbe Odea, MD  ferrous  sulfate 325 (65 FE) MG tablet Take 325 mg by mouth 2 (two) times daily.    [provider]  guaiFENesin (MUCINEX) 600 MG 12 hr tablet Take 600 mg by mouth 2 (two) times daily.    [provider]  HYDROcodone-acetaminophen (NORCO) 5-325 MG tablet Take 1-2 tablets by mouth every 6 (six) hours as needed for moderate pain. Patient taking differently: Take 1-2 tablets by mouth See admin instructions. 2 entries on MAR : 1 tablet by mouth twice daily for pain + and additional 1-2 tablets by mouth every 6 hours as needed for pain 08/16/16   Theodis Blaze, MD  [employer] [facility] EX) Apply 1 application  topically 3 (three) times daily. Apply cream to buttocks after each incontinent episode.    [provider]  levETIRAcetam (KEPPRA) 750 MG tablet Take 1 tablet (750 mg total) by mouth every 12 (twelve) hours. 01/22/15   Debbe Odea, MD  loperamide (IMODIUM) 2 MG capsule Take 2-4 mg by mouth See admin instructions. 4 mg for the first loose stool, 2 mg after subsequent stool and not to exceed 8 capsules in 24 hours.    [provider]  nitroGLYCERIN (NITROSTAT) 0.4 MG SL tablet Place 0.4 mg under the tongue every 5 (five) minutes as needed for chest pain.  [provider]  Nutritional Supplements (NUTRITIONAL DRINK PO) Take 120 mLs by mouth in the morning, at noon, and at bedtime. Med Pass.    [provider]  pantoprazole (PROTONIX) 40 MG tablet Take 40 mg by mouth daily.    [provider]  promethazine (PHENERGAN) 25 MG suppository Place 25 mg rectally every 6 (six) hours as needed for nausea or vomiting.    [provider]  promethazine (PHENERGAN) 25 MG tablet Take 25 mg by mouth every 6 (six) hours as needed for nausea or vomiting.    [provider]  sucralfate (CARAFATE) 1 g tablet Take 1 g by mouth daily with supper.    [provider]      Allergies    Codeine    Review of Systems   Review of  Systems  Unable to perform ROS: Dementia    Physical Exam Updated Vital Signs BP 124/82 (BP Location: Left Arm)   Pulse (!) 102   Temp 97.9 F (36.6 C) (Oral)   Resp (!) 22   Ht 1.524 m (5')   Wt 56.7 kg   SpO2 96%   BMI 24.41 kg/m  Physical Exam CONSTITUTIONAL: Elderly and frail HEAD: Hematoma to the forehead EYES: EOMI ENMT: Mucous membranes moist NECK: supple no meningeal signs SPINE/BACK:entire spine nontender No bruising/crepitance/stepoffs noted to spine Chest-no bruising or crepitus ABDOMEN: soft, nontender GU: Patient wearing a diaper NEURO: Pt is awake/alert lying on her left side.  Patient yelling intermittently at staff. EXTREMITIES: pulses normal/equal, full ROM Chronic contractures noted lower extremities. No obvious deformities SKIN: warm, color normal  ED Results / Procedures / Treatments   Labs (all labs ordered are listed, but only abnormal results are displayed) Labs Reviewed - No data to display  EKG EKG Interpretation  Date/Time:  Monday October 31 2022 02:22:17 EDT Ventricular Rate:  112 PR Interval:  149 QRS Duration: 101 QT Interval:  340 QTC Calculation: 460 R Axis:   59 Text Interpretation: indeterminate rhythm Ventricular premature complex Artifact in lead(s) I II III aVR aVL aVF V1 V2 V6 Confirmed by Ripley Fraise [id number]) on 10/31/2022 2:32:08 AM  Radiology CT Head Wo Contrast  Result Date: 10/31/2022 CLINICAL DATA:  Head trauma EXAM: CT HEAD WITHOUT CONTRAST TECHNIQUE: Contiguous axial images were obtained from the base of the skull through the vertex without intravenous contrast. RADIATION DOSE REDUCTION: This exam was performed according to the departmental dose-optimization program which includes automated exposure control, adjustment of the mA and/or kV according to patient size and/or use of iterative reconstruction technique. COMPARISON:  11/08/2017 FINDINGS: Brain: There is no mass, hemorrhage or extra-axial collection. There is  generalized atrophy without lobar predilection. Hypodensity of the white matter is most commonly associated with chronic microvascular disease. Vascular: Atherosclerotic calcification of the internal carotid arteries at the skull base. No abnormal hyperdensity of the major intracranial arteries or dural venous sinuses. Skull: No skull fracture.  Small left frontal scalp hematoma. Sinuses/Orbits: No fluid levels or advanced mucosal thickening of the visualized paranasal sinuses. No mastoid or middle ear effusion. The orbits are normal. IMPRESSION: 1. No acute intracranial abnormality. 2. Generalized atrophy and findings of chronic microvascular disease. Electronically Signed   By: Ulyses Jarred M.D.   On: 10/31/2022 03:21    Procedures Procedures    Medications Ordered in ED Medications - No data to display  ED Course/ Medical Decision Making/ A&P Clinical Course as of 10/31/22 0343  Mon Oct 31, 2022  0248 Patient appears  from nursing facility after she rolled out of bed hitting her head.  No other signs of acute traumatic injury.  She is not anticoagulated.  I attempted to call her nursing home but no answer.  Also attempted to call both of her children but no answer.  Patient is a DNR with a MOST form which states she is comfort care.  Will obtain CT head for now [DW]  0342 Patient resting comfortably.  No acute injury noted on CT head.  Plan for discharge back to facility [DW]    Clinical Course User Index [DW] Ripley Fraise, MD                             Medical Decision Making Amount and/or Complexity of Data Reviewed Radiology: ordered.   This patient presents to the ED for concern of fall and head injury, this involves an extensive number of treatment options, and is a complaint that carries with it a high risk of complications and morbidity.  The differential diagnosis includes but is not limited to skull fracture, subdural hematoma, intracranial hemorrhage,  concussion  Comorbidities that complicate the patient evaluation: Patient's presentation is complicated by their history of dementia  Social Determinants of Health: Patient's  poor mobility   increases the complexity of managing their presentation  Additional history obtained: Records reviewed Nursing Home Documents   Imaging Studies ordered: I ordered imaging studies including CT scan head   I independently visualized and interpreted imaging which showed no acute findings I agree with the radiologist interpretation   Reevaluation: After the interventions noted above, I reevaluated the patient and found that they have :stayed the same  Complexity of problems addressed: Patient's presentation is most consistent with  acute presentation with potential threat to life or bodily function  Disposition: After consideration of the diagnostic results and the patient's response to treatment,  I feel that the patent would benefit from discharge   .           Final Clinical Impression(s) / ED Diagnoses Final diagnoses:  Fall, initial encounter  Contusion of scalp, initial encounter    Rx / DC Orders ED Discharge Orders     None         Ripley Fraise, MD 10/31/22 [phone]

## 2022-10-31 NOTE — ED Notes (Signed)
[facility] called to transport patient to [facility]

## 2022-10-31 NOTE — ED Triage Notes (Signed)
Pt from nursing home. Dnr. Rolled off of bed while being changed. Large hemotoma forehead, baseline  dementia

## 2022-11-08 ENCOUNTER — Telehealth: Payer: Self-pay | Admitting: Orthopaedic Surgery

## 2022-11-08 NOTE — Telephone Encounter (Signed)
[facility] has questions about patients brace. Please call .Marland KitchenBanker..[phone] fax [phone]     Patient nurse called back again would like CB in the morning please advise

## 2022-11-08 NOTE — Telephone Encounter (Signed)
[facility] has questions about patients brace. Please call .Marland KitchenBanker..[phone] fax [phone]

## 2022-11-25 ENCOUNTER — Other Ambulatory Visit (INDEPENDENT_AMBULATORY_CARE_PROVIDER_SITE_OTHER)

## 2022-11-25 ENCOUNTER — Ambulatory Visit (INDEPENDENT_AMBULATORY_CARE_PROVIDER_SITE_OTHER): Admitting: Orthopaedic Surgery

## 2022-11-25 DIAGNOSIS — S52691A Other fracture of lower end of right ulna, initial encounter for closed fracture: Secondary | ICD-10-CM

## 2022-11-25 NOTE — Progress Notes (Signed)
Office Visit Note   Patient: [name] [name]           Date of Birth: Oct 06, 1926           MRN: [id number] Visit Date: 11/25/2022              Requested by: Eartha InchBadger, Michael C, MD [address] [place] [id number] PCP: Eartha InchBadger, Michael C, MD   Assessment & Plan: Visit Diagnoses:  1. Other closed fracture of distal end of right ulna, initial encounter     Plan: Impression is healed right ulnar fracture.  At this point she can discontinue the Velcro splint and activity as tolerated.  Follow-up as needed.  Follow-Up Instructions: No follow-ups on file.   Orders:  Orders Placed This Encounter  Procedures   XR Forearm Right   No orders of the defined types were placed in this encounter.     Procedures: No procedures performed   Clinical Data: No additional findings.   Subjective: Chief Complaint  Patient presents with   Right Forearm - Follow-up    HPI  [name] [name] returns today for follow-up on her right ulna fracture.  She is about 6 months status post injury.  She reports no pain.  She is pleasantly demented.  Review of Systems   Objective: Vital Signs: There were no vitals taken for this visit.  Physical Exam  Ortho Exam  Patient is uncooperative with the exam and refuses to be examined.  She is able to freely move her wrist and arm without any apparent pain.  Specialty Comments:  No specialty comments available.  Imaging: XR Forearm Right  Result Date: 11/25/2022 X-rays demonstrate complete healing of the ulnar fracture with bridging callus formation.    PMFS History: Patient Active Problem List   Diagnosis Date Noted   Closed fracture of lower end of right ulna 06/14/2022   DNR (do not resuscitate)    Encounter for hospice care discussion    Palliative care encounter    Sepsis    AKI (acute kidney injury)    Mild protein-calorie malnutrition    Dementia without behavioral disturbance    Atrial fibrillation with RVR     Urinary tract infection 08/26/2017   Dehydration 08/12/2016   SIRS (systemic inflammatory response syndrome) 08/12/2016   Malnutrition of moderate degree 01/19/2015   Femur fracture 01/18/2015   Recurrent falls 01/18/2015   Acute renal failure (HCC) 01/18/2015   Dehydration with hyponatremia 01/18/2015   FTT (failure to thrive) in adult 01/18/2015   Severe protein-calorie malnutrition 01/18/2015   Hypokalemia 01/18/2015   Periprosthetic fracture around internal prosthetic joint 01/18/2015   Closed femur fracture    Fracture of femoral neck, right 12/05/2014   CKD (chronic kidney disease) stage 3, GFR 30-59 ml/min 12/05/2014   Diastolic CHF, chronic (HCC) 11/17/2011   GERD (gastroesophageal reflux disease) 09/19/2011   Orthostatic hypotension 05/11/2011   Anxiety    Seizure disorder (HCC)    Hypertension    Hyperlipidemia    Coronary artery disease    Anemia    Tobacco abuse    Memory disorder    Past Medical History:  Diagnosis Date   Anemia    a. Chronic iron deficiency anemia   Anxiety    CAD (coronary artery disease)    a. Cath 2004: 70% ostial LAD, 50-60% prox ramus disease, treated medically. b. Nuc 12/2009 - EF 74%, normal study.   Chronic diastolic CHF (congestive heart failure) (HCC)  a. Echo 02/2014: mild focal basal hypertrophy of septum, Normal LV function EF 55-60%; grade 1 diastolic dysfunction; mildly elevated PASP.   CKD (chronic kidney disease), stage III (HCC)    COPD (chronic obstructive pulmonary disease) (HCC)    Coronary artery disease    Depression    Dizziness    Fatigue    GERD (gastroesophageal reflux disease)    Hyperlipidemia    Hypertension    Memory disorder    Seizure disorder (HCC)    Syncope and collapse    RECURRENT, RELATED TO HYPONATREMIA, NARCOTICS, AND BRADYCARDIA   Tobacco abuse    PAST    Family History  Problem Relation Age of Onset   Heart attack Father    CAD Father    Heart failure Mother    Congestive Heart Failure  Mother    Other Brother        cerebral hemmorhage   Coronary artery disease Sister     Past Surgical History:  Procedure Laterality Date   ABDOMINAL HYSTERECTOMY     early 1970's for fibroid   CARDIAC CATHETERIZATION  2004   HIP ARTHROPLASTY Right 12/05/2014   Procedure: ARTHROPLASTY BIPOLAR HIP;  Surgeon: Jodi Geralds, MD;  Location: [facility] OR;  Service: Orthopedics;  Laterality: Right;   ORIF PERIPROSTHETIC FRACTURE Right 01/18/2015   Procedure: OPEN REDUCTION INTERNAL FIXATION (ORIF) PERIPROSTHETIC FRACTURE;  Surgeon: Sheral Apley, MD;  Location: [facility] OR;  Service: Orthopedics;  Laterality: Right;   Social History   Occupational History   Not on file  Tobacco Use   Smoking status: Former    Packs/day: 1.00    Years: 40.00    Additional pack years: 0.00    Total pack years: 40.00    Types: Cigarettes    Quit date: 08/23/1999    Years since quitting: 23.2   Smokeless tobacco: Never  Substance and Sexual Activity   Alcohol use: No   Drug use: No   Sexual activity: Not on file
